# Patient Record
Sex: Female | Born: 1985
Health system: Southern US, Community
[De-identification: ages and names within clinical notes are randomized; demographics above are authoritative.]

## PROBLEM LIST (undated history)

## (undated) DIAGNOSIS — E669 Obesity, unspecified: Secondary | ICD-10-CM

## (undated) DIAGNOSIS — F99 Mental disorder, not otherwise specified: Secondary | ICD-10-CM

## (undated) DIAGNOSIS — E119 Type 2 diabetes mellitus without complications: Secondary | ICD-10-CM

## (undated) DIAGNOSIS — R161 Splenomegaly, not elsewhere classified: Secondary | ICD-10-CM

## (undated) DIAGNOSIS — I10 Essential (primary) hypertension: Secondary | ICD-10-CM

## (undated) DIAGNOSIS — G8929 Other chronic pain: Secondary | ICD-10-CM

## (undated) DIAGNOSIS — B009 Herpesviral infection, unspecified: Secondary | ICD-10-CM

## (undated) DIAGNOSIS — K2970 Gastritis, unspecified, without bleeding: Secondary | ICD-10-CM

## (undated) DIAGNOSIS — N83209 Unspecified ovarian cyst, unspecified side: Secondary | ICD-10-CM

## (undated) DIAGNOSIS — N83201 Unspecified ovarian cyst, right side: Secondary | ICD-10-CM

## (undated) DIAGNOSIS — F410 Panic disorder [episodic paroxysmal anxiety] without agoraphobia: Secondary | ICD-10-CM

## (undated) DIAGNOSIS — R748 Abnormal levels of other serum enzymes: Secondary | ICD-10-CM

## (undated) DIAGNOSIS — F32A Depression, unspecified: Secondary | ICD-10-CM

## (undated) DIAGNOSIS — R1031 Right lower quadrant pain: Secondary | ICD-10-CM

## (undated) DIAGNOSIS — R1013 Epigastric pain: Secondary | ICD-10-CM

## (undated) DIAGNOSIS — Z309 Encounter for contraceptive management, unspecified: Secondary | ICD-10-CM

## (undated) DIAGNOSIS — IMO0002 Reserved for concepts with insufficient information to code with codable children: Secondary | ICD-10-CM

## (undated) DIAGNOSIS — K760 Fatty (change of) liver, not elsewhere classified: Secondary | ICD-10-CM

## (undated) DIAGNOSIS — R7303 Prediabetes: Secondary | ICD-10-CM

## (undated) DIAGNOSIS — K219 Gastro-esophageal reflux disease without esophagitis: Secondary | ICD-10-CM

## (undated) DIAGNOSIS — F419 Anxiety disorder, unspecified: Secondary | ICD-10-CM

## (undated) DIAGNOSIS — K5900 Constipation, unspecified: Secondary | ICD-10-CM

## (undated) DIAGNOSIS — R519 Headache, unspecified: Secondary | ICD-10-CM

## (undated) DIAGNOSIS — F329 Major depressive disorder, single episode, unspecified: Secondary | ICD-10-CM

## (undated) DIAGNOSIS — R51 Headache: Secondary | ICD-10-CM

## (undated) HISTORY — DX: Fatty (change of) liver, not elsewhere classified: K76.0

## (undated) HISTORY — DX: Splenomegaly, not elsewhere classified: R16.1

## (undated) HISTORY — DX: Mental disorder, not otherwise specified: F99

## (undated) HISTORY — DX: Depression, unspecified: F32.A

## (undated) HISTORY — PX: WISDOM TOOTH EXTRACTION: SHX21

## (undated) HISTORY — DX: Unspecified ovarian cyst, right side: N83.201

## (undated) HISTORY — DX: Obesity, unspecified: E66.9

## (undated) HISTORY — DX: Abnormal levels of other serum enzymes: R74.8

## (undated) HISTORY — DX: Encounter for contraceptive management, unspecified: Z30.9

## (undated) HISTORY — DX: Major depressive disorder, single episode, unspecified: F32.9

## (undated) HISTORY — DX: Anxiety disorder, unspecified: F41.9

## (undated) HISTORY — DX: Unspecified ovarian cyst, unspecified side: N83.209

---
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
## 2007-09-16 ENCOUNTER — Emergency Department (HOSPITAL_COMMUNITY): Admission: EM | Admit: 2007-09-16 | Discharge: 2007-09-16 | Payer: Self-pay | Admitting: Emergency Medicine

## 2007-11-21 ENCOUNTER — Other Ambulatory Visit: Admission: RE | Admit: 2007-11-21 | Discharge: 2007-11-21 | Payer: Self-pay | Admitting: Obstetrics & Gynecology

## 2008-09-06 ENCOUNTER — Inpatient Hospital Stay (HOSPITAL_COMMUNITY): Admission: AD | Admit: 2008-09-06 | Discharge: 2008-09-06 | Payer: Self-pay | Admitting: Obstetrics & Gynecology

## 2008-09-06 ENCOUNTER — Ambulatory Visit: Payer: Self-pay | Admitting: Family Medicine

## 2008-09-14 ENCOUNTER — Ambulatory Visit: Payer: Self-pay | Admitting: Physician Assistant

## 2008-09-14 ENCOUNTER — Inpatient Hospital Stay (HOSPITAL_COMMUNITY): Admission: AD | Admit: 2008-09-14 | Discharge: 2008-09-17 | Payer: Self-pay | Admitting: Obstetrics & Gynecology

## 2008-09-15 ENCOUNTER — Encounter: Payer: Self-pay | Admitting: Obstetrics & Gynecology

## 2009-03-11 ENCOUNTER — Other Ambulatory Visit: Admission: RE | Admit: 2009-03-11 | Discharge: 2009-03-11 | Payer: Self-pay | Admitting: Obstetrics and Gynecology

## 2009-06-11 ENCOUNTER — Emergency Department (HOSPITAL_COMMUNITY): Admission: EM | Admit: 2009-06-11 | Discharge: 2009-06-11 | Payer: Self-pay | Admitting: Emergency Medicine

## 2009-06-11 ENCOUNTER — Encounter: Payer: Self-pay | Admitting: Orthopedic Surgery

## 2009-06-13 ENCOUNTER — Ambulatory Visit: Payer: Self-pay | Admitting: Orthopedic Surgery

## 2009-06-13 DIAGNOSIS — S8000XA Contusion of unspecified knee, initial encounter: Secondary | ICD-10-CM | POA: Insufficient documentation

## 2009-06-13 DIAGNOSIS — M25569 Pain in unspecified knee: Secondary | ICD-10-CM

## 2009-07-01 ENCOUNTER — Ambulatory Visit: Payer: Self-pay | Admitting: Orthopedic Surgery

## 2009-07-24 ENCOUNTER — Ambulatory Visit: Payer: Self-pay | Admitting: Orthopedic Surgery

## 2010-01-06 ENCOUNTER — Emergency Department (HOSPITAL_COMMUNITY): Admission: EM | Admit: 2010-01-06 | Discharge: 2010-01-06 | Payer: Self-pay | Admitting: Emergency Medicine

## 2010-03-10 ENCOUNTER — Emergency Department (HOSPITAL_COMMUNITY): Admission: EM | Admit: 2010-03-10 | Discharge: 2010-03-10 | Payer: Self-pay | Admitting: Emergency Medicine

## 2010-04-11 ENCOUNTER — Emergency Department (HOSPITAL_COMMUNITY): Admission: EM | Admit: 2010-04-11 | Discharge: 2010-04-12 | Payer: Self-pay | Admitting: Emergency Medicine

## 2010-05-11 ENCOUNTER — Emergency Department (HOSPITAL_COMMUNITY): Admission: EM | Admit: 2010-05-11 | Discharge: 2010-05-11 | Payer: Self-pay | Admitting: Emergency Medicine

## 2010-05-12 ENCOUNTER — Ambulatory Visit (HOSPITAL_COMMUNITY): Admission: RE | Admit: 2010-05-12 | Discharge: 2010-05-12 | Payer: Self-pay | Admitting: Emergency Medicine

## 2011-02-01 LAB — CBC
Hemoglobin: 14.1 g/dL (ref 12.0–15.0)
MCH: 29.6 pg (ref 26.0–34.0)
MCHC: 34.1 g/dL (ref 30.0–36.0)
MCV: 86.7 fL (ref 78.0–100.0)
Platelets: 231 10*3/uL (ref 150–400)
RDW: 13.5 % (ref 11.5–15.5)

## 2011-02-01 LAB — DIFFERENTIAL
Basophils Absolute: 0 10*3/uL (ref 0.0–0.1)
Eosinophils Relative: 2 % (ref 0–5)
Lymphocytes Relative: 21 % (ref 12–46)
Monocytes Relative: 7 % (ref 3–12)

## 2011-02-01 LAB — WET PREP, GENITAL
Trich, Wet Prep: NONE SEEN
Yeast Wet Prep HPF POC: NONE SEEN

## 2011-02-01 LAB — URINALYSIS, ROUTINE W REFLEX MICROSCOPIC
Glucose, UA: NEGATIVE mg/dL
Nitrite: NEGATIVE
pH: 6 (ref 5.0–8.0)

## 2011-02-01 LAB — URINE MICROSCOPIC-ADD ON

## 2011-02-01 LAB — POCT PREGNANCY, URINE: Preg Test, Ur: NEGATIVE

## 2011-02-02 LAB — COMPREHENSIVE METABOLIC PANEL
ALT: 30 U/L (ref 0–35)
Albumin: 3.9 g/dL (ref 3.5–5.2)
Alkaline Phosphatase: 108 U/L (ref 39–117)
CO2: 26 mEq/L (ref 19–32)
Calcium: 9.4 mg/dL (ref 8.4–10.5)
Chloride: 109 mEq/L (ref 96–112)
GFR calc non Af Amer: >60 mL/min (ref >60)
Glucose, Bld: 76 mg/dL (ref 70–99)
Potassium: 3.7 mEq/L (ref 3.5–5.1)
Total Bilirubin: 0.7 mg/dL (ref 0.3–1.2)
Total Protein: 7.3 g/dL (ref 6.0–8.3)

## 2011-02-02 LAB — URINALYSIS, ROUTINE W REFLEX MICROSCOPIC
Bilirubin Urine: NEGATIVE
Protein, ur: NEGATIVE mg/dL
Specific Gravity, Urine: >1.03 — ABNORMAL HIGH (ref 1.005–1.030)
Urobilinogen, UA: 0.2 mg/dL (ref 0.0–1.0)
pH: 5.5 (ref 5.0–8.0)

## 2011-02-02 LAB — URINE MICROSCOPIC-ADD ON

## 2011-02-02 LAB — DIFFERENTIAL
Lymphocytes Relative: 31 % (ref 12–46)
Monocytes Relative: 8 % (ref 3–12)
Neutro Abs: 5.6 10*3/uL (ref 1.7–7.7)

## 2011-02-02 LAB — URINE CULTURE

## 2011-02-02 LAB — CBC: WBC: 9.4 10*3/uL (ref 4.0–10.5)

## 2011-02-03 LAB — CBC
HCT: 39.5 % (ref 36.0–46.0)
Hemoglobin: 14 g/dL (ref 12.0–15.0)
Platelets: 243 10*3/uL (ref 150–400)
RBC: 4.71 MIL/uL (ref 3.87–5.11)
WBC: 9.6 10*3/uL (ref 4.0–10.5)

## 2011-02-03 LAB — DIFFERENTIAL
Basophils Absolute: 0.1 10*3/uL (ref 0.0–0.1)
Eosinophils Absolute: 0.1 10*3/uL (ref 0.0–0.7)
Eosinophils Relative: 1 % (ref 0–5)
Lymphocytes Relative: 22 % (ref 12–46)
Lymphs Abs: 2.1 10*3/uL (ref 0.7–4.0)
Monocytes Absolute: 0.8 10*3/uL (ref 0.1–1.0)

## 2011-02-03 LAB — BASIC METABOLIC PANEL
CO2: 24 mEq/L (ref 19–32)
Chloride: 109 mEq/L (ref 96–112)
Creatinine, Ser: 0.82 mg/dL (ref 0.4–1.2)
GFR calc Af Amer: >60 mL/min (ref >60)
GFR calc non Af Amer: >60 mL/min (ref >60)
Glucose, Bld: 97 mg/dL (ref 70–99)
Potassium: 3.8 mEq/L (ref 3.5–5.1)
Sodium: 139 mEq/L (ref 135–145)

## 2011-02-03 LAB — POCT CARDIAC MARKERS
CKMB, poc: <1 ng/mL — ABNORMAL LOW (ref 1.0–8.0)
Troponin i, poc: <0.05 ng/mL (ref 0.00–0.09)

## 2011-02-03 LAB — PREGNANCY, URINE: Preg Test, Ur: NEGATIVE

## 2011-02-04 LAB — DIFFERENTIAL
Basophils Absolute: 0 10*3/uL (ref 0.0–0.1)
Basophils Relative: 0 % (ref 0–1)
Eosinophils Absolute: 0 10*3/uL (ref 0.0–0.7)
Eosinophils Relative: 0 % (ref 0–5)

## 2011-02-04 LAB — COMPREHENSIVE METABOLIC PANEL
ALT: 60 U/L — ABNORMAL HIGH (ref 0–35)
AST: 88 U/L — ABNORMAL HIGH (ref 0–37)
Alkaline Phosphatase: 122 U/L — ABNORMAL HIGH (ref 39–117)
CO2: 21 mEq/L (ref 19–32)
Chloride: 110 mEq/L (ref 96–112)
GFR calc Af Amer: >60 mL/min (ref >60)
GFR calc non Af Amer: >60 mL/min (ref >60)
Glucose, Bld: 104 mg/dL — ABNORMAL HIGH (ref 70–99)
Sodium: 140 mEq/L (ref 135–145)
Total Bilirubin: 0.5 mg/dL (ref 0.3–1.2)

## 2011-02-04 LAB — CBC
Hemoglobin: 15.5 g/dL — ABNORMAL HIGH (ref 12.0–15.0)
RBC: 5.53 MIL/uL — ABNORMAL HIGH (ref 3.87–5.11)
WBC: 5.7 10*3/uL (ref 4.0–10.5)

## 2011-05-12 ENCOUNTER — Other Ambulatory Visit (HOSPITAL_COMMUNITY)
Admission: RE | Admit: 2011-05-12 | Discharge: 2011-05-12 | Disposition: A | Discharge status: Home / Self Care | Payer: Medicaid Other | Source: Ambulatory Visit | Attending: Obstetrics and Gynecology | Admitting: Obstetrics and Gynecology

## 2011-05-12 ENCOUNTER — Other Ambulatory Visit: Payer: Self-pay | Admitting: Adult Health

## 2011-05-12 DIAGNOSIS — Z113 Encounter for screening for infections with a predominantly sexual mode of transmission: Secondary | ICD-10-CM | POA: Insufficient documentation

## 2011-05-12 DIAGNOSIS — Z01419 Encounter for gynecological examination (general) (routine) without abnormal findings: Secondary | ICD-10-CM | POA: Insufficient documentation

## 2011-05-26 ENCOUNTER — Other Ambulatory Visit: Payer: Self-pay

## 2011-05-26 ENCOUNTER — Emergency Department (HOSPITAL_COMMUNITY)
Admission: EM | Admit: 2011-05-26 | Discharge: 2011-05-27 | Disposition: A | Discharge status: Home / Self Care | Payer: Medicaid Other | Attending: Emergency Medicine | Admitting: Emergency Medicine

## 2011-05-26 ENCOUNTER — Emergency Department (HOSPITAL_COMMUNITY): Payer: Medicaid Other

## 2011-05-26 ENCOUNTER — Encounter: Payer: Self-pay | Admitting: *Deleted

## 2011-05-26 DIAGNOSIS — R071 Chest pain on breathing: Secondary | ICD-10-CM | POA: Insufficient documentation

## 2011-05-26 DIAGNOSIS — R0789 Other chest pain: Secondary | ICD-10-CM

## 2011-05-26 MED ORDER — IBUPROFEN 800 MG PO TABS
800.0000 mg | ORAL_TABLET | Freq: Once | ORAL | Status: AC
  Administered 2011-05-27: 800 mg via ORAL
  Filled 2011-05-26: qty 1

## 2011-05-26 MED ORDER — ONDANSETRON HCL 4 MG/2ML IJ SOLN
4.0000 mg | Freq: Once | INTRAMUSCULAR | Status: AC
  Administered 2011-05-27: 4 mg via INTRAMUSCULAR
  Filled 2011-05-26: qty 2

## 2011-05-26 MED ORDER — HYDROCODONE-ACETAMINOPHEN 5-325 MG PO TABS
1.0000 | ORAL_TABLET | Freq: Once | ORAL | Status: AC
  Administered 2011-05-27: 1 via ORAL
  Filled 2011-05-26: qty 1

## 2011-05-26 NOTE — ED Notes (Signed)
C/o mid to left sided chest pain onset while playing with her son approx 3 hours ago; c/o nausea with pain; describes as sharp and tight, non-radiating; states pain is made worse with deep breathing

## 2011-05-26 NOTE — ED Notes (Signed)
EDP at bedside  

## 2011-05-26 NOTE — ED Provider Notes (Signed)
History     Chief Complaint  Patient presents with  . Chest Pain    mid to left-sided chest pain x 3 hours   Patient is a 25 y.o. female presenting with chest pain. The history is provided by the patient.  Chest Pain The chest pain began 3 - 5 hours ago. Chest pain occurs intermittently. The chest pain is unchanged. The pain is associated with breathing, coughing and lifting. The severity of the pain is moderate. The quality of the pain is described as aching and sharp. The pain does not radiate. Chest pain is worsened by deep breathing and certain positions. Pertinent negatives for primary symptoms include no fever, no syncope, no shortness of breath, no cough, no wheezing, no palpitations, no abdominal pain, no nausea, no vomiting and no dizziness. She tried nothing for the symptoms. Risk factors include no known risk factors.     History reviewed. No pertinent past medical history.  History reviewed. No pertinent past surgical history.  No family history on file.  History  Substance Use Topics  . Smoking status: Never Smoker   . Smokeless tobacco: Not on file  . Alcohol Use: No    OB History    Grav Para Term Preterm Abortions TAB SAB Ect Mult Living                  Review of Systems  Constitutional: Negative for fever.  Respiratory: Negative for cough, shortness of breath and wheezing.   Cardiovascular: Positive for chest pain. Negative for palpitations and syncope.  Gastrointestinal: Negative for nausea, vomiting and abdominal pain.  Neurological: Negative for dizziness.  All other systems reviewed and are negative.    Physical Exam  BP 115/55  Pulse 101  Temp(Src) 98.4 F (36.9 C) (Oral)  Resp 20  Ht 5\' 7"  (1.702 m)  Wt 268 lb (121.564 kg)  BMI 41.97 kg/m2  SpO2 100%  LMP 05/12/2011  Physical Exam  Nursing note and vitals reviewed. Constitutional: She is oriented to person, place, and time. She appears well-developed and well-nourished.  HENT:  Head:  Normocephalic and atraumatic.  Eyes: EOM are normal. Pupils are equal, round, and reactive to light.  Neck: Normal range of motion. Neck supple.  Cardiovascular: Normal rate, regular rhythm, normal heart sounds and intact distal pulses.   Pulmonary/Chest: Effort normal and breath sounds normal. She exhibits tenderness.       Mild chest pain with palpation over anterior chest  Abdominal: Soft. Bowel sounds are normal.  Musculoskeletal: Normal range of motion.  Neurological: She is alert and oriented to person, place, and time.  Skin: Skin is warm and dry.  Psychiatric: She has a normal mood and affect.    ED Course  Procedures  MDM  Date: 05/26/2011  Rate: 100  Rhythm: normal sinus rhythm  QRS Axis: normal  Intervals: normal  ST/T Wave abnormalities: normal  Conduction Disutrbances:none  Narrative Interpretation:   Old EKG Reviewed: none available        Pairlee Sawtell S. Colon Branch, MD 05/27/11 [phone]

## 2011-05-27 ENCOUNTER — Other Ambulatory Visit (HOSPITAL_COMMUNITY): Payer: Self-pay | Admitting: Emergency Medicine

## 2011-05-27 LAB — BASIC METABOLIC PANEL
CO2: 23 mEq/L (ref 19–32)
Calcium: 9.6 mg/dL (ref 8.4–10.5)
Creatinine, Ser: 0.93 mg/dL (ref 0.50–1.10)
GFR calc Af Amer: >60 mL/min (ref >60)
GFR calc non Af Amer: >60 mL/min (ref >60)

## 2011-05-27 LAB — D-DIMER, QUANTITATIVE: D-Dimer, Quant: 0.6 ug/mL-FEU — ABNORMAL HIGH (ref 0.00–0.48)

## 2011-05-27 NOTE — ED Notes (Signed)
Pt reports pain in chest.  Reports pain gets worse when she breaths deeply.  Reports that pain started yesterday.  Denies any radiation of pain.  Reports some nausea as well.  Denies vomiting or fever.

## 2011-05-27 NOTE — ED Notes (Signed)
Pt reports abdominal pain has improved.  Nausea subsided.  Pt tolerating PO fluids with no difficulty at this time.

## 2011-05-27 NOTE — Progress Notes (Signed)
0454 Reviewed results with patient and her mother. Patient has been painfree after being given anagesic. She has taken PO meds and PO fluids

## 2011-08-06 ENCOUNTER — Encounter (HOSPITAL_COMMUNITY): Payer: Self-pay | Admitting: *Deleted

## 2011-08-06 ENCOUNTER — Emergency Department (HOSPITAL_COMMUNITY)
Admission: EM | Admit: 2011-08-06 | Discharge: 2011-08-06 | Disposition: A | Discharge status: Home / Self Care | Payer: Medicaid Other | Attending: Emergency Medicine | Admitting: Emergency Medicine

## 2011-08-06 DIAGNOSIS — L089 Local infection of the skin and subcutaneous tissue, unspecified: Secondary | ICD-10-CM | POA: Insufficient documentation

## 2011-08-06 DIAGNOSIS — M79609 Pain in unspecified limb: Secondary | ICD-10-CM | POA: Insufficient documentation

## 2011-08-06 MED ORDER — SULFAMETHOXAZOLE-TRIMETHOPRIM 800-160 MG PO TABS: 1.0000 | ORAL_TABLET | Freq: Two times a day (BID) | ORAL | Status: AC

## 2011-08-06 MED ORDER — IBUPROFEN 600 MG PO TABS: 600.0000 mg | ORAL_TABLET | Freq: Four times a day (QID) | ORAL | Status: AC | PRN

## 2011-08-06 NOTE — ED Notes (Signed)
Pt c/o pain to right great toe. Pt states toe has been hurting for 3 days. Pt states she has an ingrown toenail.

## 2011-08-06 NOTE — ED Provider Notes (Signed)
History     CSN: [id number] Arrival date & time: 08/06/2011 12:25 PM   Chief Complaint  Patient presents with  . Toe Pain     (Include location/radiation/quality/duration/timing/severity/associated sxs/prior treatment) Patient is a 25 y.o. female presenting with toe pain. The history is provided by the patient.  Toe Pain This is a new problem. The current episode started in the past 7 days. The problem occurs constantly. The problem has been unchanged. Pertinent negatives include no fever. The symptoms are aggravated by walking and standing (Palpation). Treatments tried: A pin was poked in the wound yesterday with no drainage obtained. The treatment provided no relief.     History reviewed. No pertinent past medical history.   History reviewed. No pertinent past surgical history.  History reviewed. No pertinent family history.  History  Substance Use Topics  . Smoking status: Never Smoker   . Smokeless tobacco: Not on file  . Alcohol Use: No    OB History    Grav Para Term Preterm Abortions TAB SAB Ect Mult Living                  Review of Systems  Constitutional: Negative for fever.  All other systems reviewed and are negative.    Allergies  Review of patient's allergies indicates no known allergies.  Home Medications   Current Outpatient Rx  Name Route Sig Dispense Refill  . FLUCONAZOLE 100 MG PO TABS Oral Take 100 mg by mouth 2 (two) times daily.      . IBUPROFEN 200 MG PO TABS Oral Take 200 mg by mouth every 6 (six) hours as needed. For pain     . MEDROXYPROGESTERONE ACETATE 150 MG/ML IM SUSP Intramuscular Inject 150 mg into the muscle every 3 (three) months. Last shot in september       Physical Exam    BP 134/77  Pulse 96  Temp(Src) 97.9 F (36.6 C) (Oral)  Resp 18  Ht 5\' 7"  (1.702 m)  Wt 263 lb (119.296 kg)  BMI 41.19 kg/m2  SpO2 100%  LMP 07/11/2011  Physical Exam  Nursing note and vitals reviewed. Constitutional: She is oriented to  person, place, and time. She appears well-developed and well-nourished.  HENT:  Head: Normocephalic.  Eyes: Conjunctivae are normal.  Neck: Normal range of motion.  Cardiovascular: Normal rate and intact distal pulses.  Exam reveals no decreased pulses.   Pulses:      Dorsalis pedis pulses are 2+ on the right side, and 2+ on the left side.       Posterior tibial pulses are 2+ on the right side, and 2+ on the left side.  Pulmonary/Chest: Effort normal.  Musculoskeletal: She exhibits tenderness. She exhibits no edema.       Right ankle: Achilles tendon normal.       Feet:       Toe nail cuticle with erythema,  ttp with scant purulent drainage from nail fold,  No induration or fluctuance, no identifiable drainable pus pocket.     Neurological: She is alert and oriented to person, place, and time. No sensory deficit.  Skin: Skin is warm, dry and intact.    ED Course  Procedures  Results for orders placed during the hospital encounter of 05/26/11  D-DIMER, QUANTITATIVE      Component Value Range   D-Dimer, Quant 0.60 (*) 0.00 - 0.48 (ug/mL-FEU)  BASIC METABOLIC PANEL      Component Value Range   Sodium 138  135 - 145 (  mEq/L)   Potassium 3.5  3.5 - 5.1 (mEq/L)   Chloride 105  96 - 112 (mEq/L)   CO2 23  19 - 32 (mEq/L)   Glucose, Bld 86  70 - 99 (mg/dL)   BUN 16  6 - 23 (mg/dL)   Creatinine, Ser 8.11  0.50 - 1.10 (mg/dL)   Calcium 9.6  8.4 - 91.4 (mg/dL)   GFR calc non Af Amer >60  >60 (mL/min)   GFR calc Af Amer >60  >60 (mL/min)   No results found.   No diagnosis found.   MDM  Skin infection,  Possible ingrown toenail.  Abx,  Warm epsom soaks,  F/u pcp for  Recheck next week.      Candis Musa, PA 08/06/11 1439

## 2011-08-11 ENCOUNTER — Emergency Department (HOSPITAL_COMMUNITY)
Admission: EM | Admit: 2011-08-11 | Discharge: 2011-08-11 | Disposition: A | Discharge status: Home / Self Care | Payer: Medicaid Other | Attending: Emergency Medicine | Admitting: Emergency Medicine

## 2011-08-11 ENCOUNTER — Encounter (HOSPITAL_COMMUNITY): Payer: Self-pay | Admitting: Emergency Medicine

## 2011-08-11 DIAGNOSIS — L03039 Cellulitis of unspecified toe: Secondary | ICD-10-CM | POA: Insufficient documentation

## 2011-08-11 DIAGNOSIS — L03031 Cellulitis of right toe: Secondary | ICD-10-CM

## 2011-08-11 MED ORDER — LIDOCAINE HCL (PF) 2 % IJ SOLN
INTRAMUSCULAR | Status: AC
  Administered 2011-08-11: 17:00:00
  Filled 2011-08-11: qty 10

## 2011-08-11 MED ORDER — CEPHALEXIN 500 MG PO CAPS: 500.0000 mg | ORAL_CAPSULE | Freq: Four times a day (QID) | ORAL | Status: AC

## 2011-08-11 MED ORDER — HYDROCODONE-ACETAMINOPHEN 5-325 MG PO TABS: 1.0000 | ORAL_TABLET | ORAL | Status: AC | PRN

## 2011-08-11 NOTE — ED Provider Notes (Signed)
History     CSN: [id number] Arrival date & time: 08/11/2011  3:39 PM  Chief Complaint  Patient presents with  . Toe Pain    HPI  (Consider location/radiation/quality/duration/timing/severity/associated sxs/prior treatment)  Patient is a 25 y.o. female presenting with toe pain. The history is provided by the patient.  Toe Pain This is a recurrent problem. The current episode started in the past 7 days. The problem occurs constantly. The problem has been gradually worsening. Pertinent negatives include no chills or fever. Exacerbated by: palpation. She has tried NSAIDs (antibiotics and warm soaks.) for the symptoms. The treatment provided no relief.    History reviewed. No pertinent past medical history.  History reviewed. No pertinent past surgical history.  Family History  Problem Relation Age of Onset  . Diabetes Mother   . Diabetes Other   . Hypertension Other     History  Substance Use Topics  . Smoking status: Former Smoker -- 3 years    Types: Cigarettes    Quit date: 07/28/2011  . Smokeless tobacco: Never Used  . Alcohol Use: No    OB History    Grav Para Term Preterm Abortions TAB SAB Ect Mult Living   1 1 1              Review of Systems  Review of Systems  Constitutional: Negative for fever and chills.  All other systems reviewed and are negative.    Allergies  Review of patient's allergies indicates no known allergies.  Home Medications   Current Outpatient Rx  Name Route Sig Dispense Refill  . IBUPROFEN 600 MG PO TABS Oral Take 1 tablet (600 mg total) by mouth every 6 (six) hours as needed for pain. 30 tablet 0  . MEDROXYPROGESTERONE ACETATE 150 MG/ML IM SUSP Intramuscular Inject 150 mg into the muscle every 3 (three) months. Last shot in september     . SULFAMETHOXAZOLE-TRIMETHOPRIM 800-160 MG PO TABS Oral Take 1 tablet by mouth 2 (two) times daily. 14 tablet 0  . FLUCONAZOLE 100 MG PO TABS Oral Take 100 mg by mouth 2 (two) times daily.      Marland Kitchen  HYDROCODONE-ACETAMINOPHEN 5-325 MG PO TABS Oral Take 1 tablet by mouth every 4 (four) hours as needed for pain. 15 tablet 0  . IBUPROFEN 200 MG PO TABS Oral Take 200 mg by mouth every 6 (six) hours as needed. For pain       Physical Exam    BP 126/65  Pulse 97  Temp(Src) 98.1 F (36.7 C) (Oral)  Resp 20  Ht 5\' 6"  (1.676 m)  Wt 236 lb (107.049 kg)  BMI 38.09 kg/m2  SpO2 99%  LMP 07/11/2011  Physical Exam  Nursing note and vitals reviewed. Constitutional: She is oriented to person, place, and time. She appears well-developed and well-nourished.  HENT:  Head: Normocephalic and atraumatic.  Eyes: Conjunctivae are normal.  Neck: Normal range of motion.  Cardiovascular: Normal rate, regular rhythm, normal heart sounds and intact distal pulses.   Pulmonary/Chest: Effort normal and breath sounds normal. She has no wheezes.  Abdominal: Soft. Bowel sounds are normal. There is no tenderness.  Musculoskeletal: Normal range of motion.  Neurological: She is alert and oriented to person, place, and time.  Skin: Skin is warm and dry. There is erythema.       Erythema along lateral nail cuticle of right great toe,  Less swelling than appreciated on last weeks exam.  Scant dried yellow dc,  No induration or fluctuance.  Nail edge below distal nail cuticle consistent with ingrown nail.  Pedal pulses intact.    Psychiatric: She has a normal mood and affect.    ED Course  Excise ingrown toenail Date/Time: 08/11/2011 5:24 PM Performed by: Reese Stockman L Authorized by: [name] Consent: Verbal consent obtained. Risks and benefits: risks, benefits and alternatives were discussed Consent given by: patient Patient understanding: patient states understanding of the procedure being performed Preparation: Patient was prepped and draped in the usual sterile fashion. Local anesthesia used: yes Anesthesia: digital block Local anesthetic: lidocaine 2% without epinephrine Anesthetic total: 3  ml Patient tolerance: Patient tolerated the procedure well with no immediate complications. Comments: Nail edge trimmed after loosening from under lateral nail fold,  No hook or significant ingrown nail found.   (including critical care time)  Family quite upset over this being second visit and "nothing was done for her".  Explained that she was treated with abx and anti - inflammatory which is the standard emergency care treatment this type complaint and definitive care should be by pcp or podiatry specialist.  Father still very angry as pt was unable to get in with pcp for  Recheck,  And apparently felt we are somehow responsible for this.   Offered to numb the toe,  And trim the nail back and pull any ingrown nail out,  But reiterated more than once that this was at best a temporary solution,  And she will still probably need definitive tx for this.  Deferred at first,  Then agreed to procedure.  See procedure note.  No ingrown nail appreciated with exploration .    MDM Hydrocodone for pain,  Patient to complete abx,  Also added keflex,  Continued warm soaks.  Referral to Dr Pricilla Holm, podiatry.   Spoke with Dr Renard Matter who also  recommends to podiatry for definitive f/u care.        Candis Musa, PA 08/11/11 1729

## 2011-08-11 NOTE — ED Notes (Signed)
Pt states she has had R great toe infection and pain for over a week. Was seen her last week and placed on abx. States pain and redness is worse.

## 2011-08-12 NOTE — ED Provider Notes (Signed)
Medical screening examination/treatment/procedure(s) were performed by non-physician practitioner and as supervising physician I was immediately available for consultation/collaboration.   [name], MD 08/12/11 1538

## 2011-08-13 NOTE — ED Provider Notes (Signed)
Medical screening examination/treatment/procedure(s) were performed by non-physician practitioner and as supervising physician I was immediately available for consultation/collaboration.  Raeford Razor, MD 08/13/11 [phone]

## 2011-08-18 LAB — CBC
MCV: 94.2
Platelets: 233
WBC: 12.3 — ABNORMAL HIGH

## 2011-08-18 LAB — RPR: RPR Ser Ql: NONREACTIVE

## 2011-08-26 ENCOUNTER — Emergency Department (HOSPITAL_COMMUNITY)
Admission: EM | Admit: 2011-08-26 | Discharge: 2011-08-27 | Disposition: A | Discharge status: Home / Self Care | Payer: Medicaid Other | Attending: Emergency Medicine | Admitting: Emergency Medicine

## 2011-08-26 ENCOUNTER — Encounter (HOSPITAL_COMMUNITY): Payer: Self-pay

## 2011-08-26 DIAGNOSIS — R748 Abnormal levels of other serum enzymes: Secondary | ICD-10-CM | POA: Insufficient documentation

## 2011-08-26 DIAGNOSIS — R1031 Right lower quadrant pain: Secondary | ICD-10-CM | POA: Insufficient documentation

## 2011-08-26 DIAGNOSIS — N83209 Unspecified ovarian cyst, unspecified side: Secondary | ICD-10-CM | POA: Insufficient documentation

## 2011-08-26 LAB — BASIC METABOLIC PANEL
BUN: 11 mg/dL (ref 6–23)
Chloride: 104 mEq/L (ref 96–112)
Creatinine, Ser: 0.69 mg/dL (ref 0.50–1.10)
GFR calc Af Amer: >90 mL/min (ref >90)

## 2011-08-26 LAB — CBC
HCT: 41.2 % (ref 36.0–46.0)
Hemoglobin: 14.3 g/dL (ref 12.0–15.0)
MCH: 29.9 pg (ref 26.0–34.0)
MCHC: 34.7 g/dL (ref 30.0–36.0)
MCV: 86 fL (ref 78.0–100.0)

## 2011-08-26 LAB — URINALYSIS, ROUTINE W REFLEX MICROSCOPIC
Bilirubin Urine: NEGATIVE
Bilirubin Urine: NEGATIVE
Hgb urine dipstick: NEGATIVE
Ketones, ur: NEGATIVE
Nitrite: POSITIVE — AB
Specific Gravity, Urine: 1.015
Specific Gravity, Urine: 1.015 (ref 1.005–1.030)
Urobilinogen, UA: 0.2
Urobilinogen, UA: 0.2 mg/dL (ref 0.0–1.0)
pH: 5.5 (ref 5.0–8.0)

## 2011-08-26 LAB — DIFFERENTIAL
Basophils Relative: 0 % (ref 0–1)
Eosinophils Relative: 1 % (ref 0–5)
Monocytes Absolute: 1.1 10*3/uL — ABNORMAL HIGH (ref 0.1–1.0)
Monocytes Relative: 9 % (ref 3–12)
Neutro Abs: 6.8 10*3/uL (ref 1.7–7.7)

## 2011-08-26 LAB — URINE MICROSCOPIC-ADD ON

## 2011-08-26 MED ORDER — SODIUM CHLORIDE 0.9 % IV SOLN
Freq: Once | INTRAVENOUS | Status: AC
  Administered 2011-08-27: 1000 mL via INTRAVENOUS

## 2011-08-26 MED ORDER — MORPHINE SULFATE 2 MG/ML IJ SOLN
2.0000 mg | Freq: Once | INTRAMUSCULAR | Status: AC
  Administered 2011-08-27: 2 mg via INTRAVENOUS
  Filled 2011-08-26: qty 1

## 2011-08-26 MED ORDER — ONDANSETRON HCL 4 MG/2ML IJ SOLN
4.0000 mg | Freq: Once | INTRAMUSCULAR | Status: AC
  Administered 2011-08-27: 4 mg via INTRAVENOUS
  Filled 2011-08-26: qty 2

## 2011-08-26 NOTE — ED Notes (Signed)
rlq abd pain x 2 days

## 2011-08-27 LAB — HEPATIC FUNCTION PANEL
ALT: 21 U/L (ref 0–35)
Albumin: 3.8 g/dL (ref 3.5–5.2)
Alkaline Phosphatase: 101 U/L (ref 39–117)
Total Protein: 7.2 g/dL (ref 6.0–8.3)

## 2011-08-27 MED ORDER — IBUPROFEN 600 MG PO TABS: 600.0000 mg | ORAL_TABLET | Freq: Four times a day (QID) | ORAL | Status: AC | PRN

## 2011-08-27 NOTE — ED Provider Notes (Addendum)
History     CSN: [id number] Arrival date & time: 08/26/2011 10:20 PM  Chief Complaint  Patient presents with  . Abdominal Pain    (Consider location/radiation/quality/duration/timing/severity/associated sxs/prior treatment) HPI Comments: Seen 2314.  Patient is a 25 y.o. female presenting with abdominal pain. The history is provided by the patient.  Abdominal Pain The primary symptoms of the illness include abdominal pain. The current episode started yesterday. The onset of the illness was gradual. The problem has been rapidly worsening.  The abdominal pain is located in the RLQ. The abdominal pain does not radiate. The severity of the abdominal pain is 7/10. The abdominal pain is relieved by nothing.  The patient states that she believes she is currently not pregnant (Patient is on depo). The patient has not had a change in bowel habit.    History reviewed. No pertinent past medical history.  History reviewed. No pertinent past surgical history.  Family History  Problem Relation Age of Onset  . Diabetes Mother   . Diabetes Other   . Hypertension Other     History  Substance Use Topics  . Smoking status: Former Smoker -- 3 years    Types: Cigarettes    Quit date: 07/28/2011  . Smokeless tobacco: Never Used  . Alcohol Use: No    OB History    Grav Para Term Preterm Abortions TAB SAB Ect Mult Living   1 1 1              Review of Systems  Gastrointestinal: Positive for abdominal pain.  All other systems reviewed and are negative.    Allergies  Review of patient's allergies indicates no known allergies.  Home Medications   Current Outpatient Rx  Name Route Sig Dispense Refill  . IBUPROFEN 200 MG PO TABS Oral Take 200 mg by mouth every 6 (six) hours as needed. For pain     . MEDROXYPROGESTERONE ACETATE 150 MG/ML IM SUSP Intramuscular Inject 150 mg into the muscle every 3 (three) months. Last shot in september    . FLUCONAZOLE 100 MG PO TABS Oral Take 100 mg by  mouth 2 (two) times daily.        BP 150/76  Pulse 102  Temp 98.2 F (36.8 C)  Resp 18  Ht 5\' 7"  (1.702 m)  Wt 236 lb (107.049 kg)  BMI 36.96 kg/m2  SpO2 99%  LMP 07/11/2011  Physical Exam  Nursing note and vitals reviewed. Constitutional: She is oriented to person, place, and time. She appears well-developed and well-nourished.  HENT:  Head: Normocephalic and atraumatic.  Mouth/Throat: Oropharynx is clear and moist.  Eyes: EOM are normal.  Neck: Normal range of motion. Neck supple.  Cardiovascular: Normal rate, normal heart sounds and intact distal pulses.   Pulmonary/Chest: Effort normal and breath sounds normal.  Abdominal: Soft. Bowel sounds are normal. She exhibits no distension. There is tenderness.       RLQ tenderness with no rebound or guarding.  Genitourinary:       No marked tenderness to right adnexa with bimanual exam  Musculoskeletal: Normal range of motion.  Neurological: She is alert and oriented to person, place, and time.  Skin: Skin is warm and dry.    ED Course  Procedures (including critical care time)  Results for orders placed during the hospital encounter of 08/26/11  CBC      Component Value Range   WBC 11.7 (*) 4.0 - 10.5 (K/uL)   RBC 4.79  3.87 - 5.11 (MIL/uL)  Hemoglobin 14.3  12.0 - 15.0 (g/dL)   HCT 16.1  09.6 - 04.5 (%)   MCV 86.0  78.0 - 100.0 (fL)   MCH 29.9  26.0 - 34.0 (pg)   MCHC 34.7  30.0 - 36.0 (g/dL)   RDW 40.9  81.1 - 91.4 (%)   Platelets 248  150 - 400 (K/uL)  DIFFERENTIAL      Component Value Range   Neutrophils Relative 58  43 - 77 (%)   Neutro Abs 6.8  1.7 - 7.7 (K/uL)   Lymphocytes Relative 31  12 - 46 (%)   Lymphs Abs 3.6  0.7 - 4.0 (K/uL)   Monocytes Relative 9  3 - 12 (%)   Monocytes Absolute 1.1 (*) 0.1 - 1.0 (K/uL)   Eosinophils Relative 1  0 - 5 (%)   Eosinophils Absolute 0.2  0.0 - 0.7 (K/uL)   Basophils Relative 0  0 - 1 (%)   Basophils Absolute 0.1  0.0 - 0.1 (K/uL)  BASIC METABOLIC PANEL       Component Value Range   Sodium 137  135 - 145 (mEq/L)   Potassium 3.7  3.5 - 5.1 (mEq/L)   Chloride 104  96 - 112 (mEq/L)   CO2 22  19 - 32 (mEq/L)   Glucose, Bld 122 (*) 70 - 99 (mg/dL)   BUN 11  6 - 23 (mg/dL)   Creatinine, Ser 7.82  0.50 - 1.10 (mg/dL)   Calcium 9.4  8.4 - 95.6 (mg/dL)   GFR calc non Af Amer >90  >90 (mL/min)   GFR calc Af Amer >90  >90 (mL/min)  LIPASE, BLOOD      Component Value Range   Lipase 66 (*) 11 - 59 (U/L)  URINALYSIS, ROUTINE W REFLEX MICROSCOPIC      Component Value Range   Color, Urine YELLOW  YELLOW    Appearance CLEAR  CLEAR    Specific Gravity, Urine 1.015  1.005 - 1.030    pH 5.5  5.0 - 8.0    Glucose, UA NEGATIVE  NEGATIVE (mg/dL)   Hgb urine dipstick NEGATIVE  NEGATIVE    Bilirubin Urine NEGATIVE  NEGATIVE    Ketones, ur NEGATIVE  NEGATIVE (mg/dL)   Protein, ur NEGATIVE  NEGATIVE (mg/dL)   Urobilinogen, UA 0.2  0.0 - 1.0 (mg/dL)   Nitrite NEGATIVE  NEGATIVE    Leukocytes, UA NEGATIVE  NEGATIVE   PREGNANCY, URINE      Component Value Range   Preg Test, Ur NEGATIVE    HEPATIC FUNCTION PANEL      Component Value Range   Total Protein 7.2  6.0 - 8.3 (g/dL)   Albumin 3.8  3.5 - 5.2 (g/dL)   AST 36  0 - 37 (U/L)   ALT 21  0 - 35 (U/L)   Alkaline Phosphatase 101  39 - 117 (U/L)   Total Bilirubin 0.3  0.3 - 1.2 (mg/dL)   Bilirubin, Direct 0.1  0.0 - 0.3 (mg/dL)   Indirect Bilirubin 0.2 (*) 0.3 - 0.9 (mg/dL)   Patient with RLQ abdominal pain. LMP in August. She is on Depo. Has a h/o ovarian cysts in the past. Pain is similar. Labs unremarkable. Elevated lipase at 66 without epigastric or RUQ pain. Can be followed by PCP. Patient responded to IVF, analgesics.Has taken PO fluids. Pt feels improved after observation and/or treatment in ED.Pt stable in ED with no significant deterioration in condition.The patient appears reasonably screened and/or stabilized for discharge and I doubt  any other medical condition or other [facility] requiring further  screening, evaluation, or treatment in the ED at this time prior to discharge. MDM Reviewed: nursing note and vitals Interpretation: labs           Nicoletta Dress. Colon Branch, MD 08/27/11 0240  Nicoletta Dress. Colon Branch, MD 09/12/11 [phone]

## 2011-08-27 NOTE — ED Notes (Signed)
Pt stable at discharge pain relieved; steady gait

## 2011-08-28 ENCOUNTER — Ambulatory Visit (HOSPITAL_COMMUNITY)
Admission: RE | Admit: 2011-08-28 | Discharge: 2011-08-28 | Disposition: A | Discharge status: Home / Self Care | Payer: Medicaid Other | Source: Ambulatory Visit | Attending: Family Medicine | Admitting: Family Medicine

## 2011-08-28 ENCOUNTER — Other Ambulatory Visit (HOSPITAL_COMMUNITY): Payer: Self-pay | Admitting: Family Medicine

## 2011-08-28 DIAGNOSIS — R1031 Right lower quadrant pain: Secondary | ICD-10-CM

## 2011-08-28 DIAGNOSIS — N915 Oligomenorrhea, unspecified: Secondary | ICD-10-CM | POA: Insufficient documentation

## 2011-08-31 ENCOUNTER — Other Ambulatory Visit (HOSPITAL_COMMUNITY): Payer: Self-pay | Admitting: General Surgery

## 2011-08-31 ENCOUNTER — Ambulatory Visit (HOSPITAL_COMMUNITY)
Admission: RE | Admit: 2011-08-31 | Discharge: 2011-08-31 | Disposition: A | Discharge status: Home / Self Care | Payer: Medicaid Other | Source: Ambulatory Visit | Attending: General Surgery | Admitting: General Surgery

## 2011-08-31 DIAGNOSIS — R109 Unspecified abdominal pain: Secondary | ICD-10-CM

## 2011-08-31 DIAGNOSIS — R1031 Right lower quadrant pain: Secondary | ICD-10-CM | POA: Insufficient documentation

## 2011-09-21 ENCOUNTER — Emergency Department (HOSPITAL_COMMUNITY)
Admission: EM | Admit: 2011-09-21 | Discharge: 2011-09-21 | Disposition: A | Discharge status: Home / Self Care | Payer: Medicaid Other | Attending: Emergency Medicine | Admitting: Emergency Medicine

## 2011-09-21 ENCOUNTER — Encounter (HOSPITAL_COMMUNITY): Payer: Self-pay | Admitting: *Deleted

## 2011-09-21 DIAGNOSIS — M549 Dorsalgia, unspecified: Secondary | ICD-10-CM

## 2011-09-21 DIAGNOSIS — R350 Frequency of micturition: Secondary | ICD-10-CM | POA: Insufficient documentation

## 2011-09-21 DIAGNOSIS — R300 Dysuria: Secondary | ICD-10-CM | POA: Insufficient documentation

## 2011-09-21 DIAGNOSIS — R109 Unspecified abdominal pain: Secondary | ICD-10-CM | POA: Insufficient documentation

## 2011-09-21 DIAGNOSIS — N390 Urinary tract infection, site not specified: Secondary | ICD-10-CM | POA: Insufficient documentation

## 2011-09-21 DIAGNOSIS — M545 Low back pain, unspecified: Secondary | ICD-10-CM | POA: Insufficient documentation

## 2011-09-21 DIAGNOSIS — Z87891 Personal history of nicotine dependence: Secondary | ICD-10-CM | POA: Insufficient documentation

## 2011-09-21 DIAGNOSIS — B009 Herpesviral infection, unspecified: Secondary | ICD-10-CM | POA: Insufficient documentation

## 2011-09-21 HISTORY — DX: Herpesviral infection, unspecified: B00.9

## 2011-09-21 LAB — URINALYSIS, ROUTINE W REFLEX MICROSCOPIC
Bilirubin Urine: NEGATIVE
Glucose, UA: NEGATIVE mg/dL
Ketones, ur: NEGATIVE mg/dL
pH: 6.5 (ref 5.0–8.0)

## 2011-09-21 LAB — URINE MICROSCOPIC-ADD ON

## 2011-09-21 MED ORDER — KETOROLAC TROMETHAMINE 60 MG/2ML IM SOLN
60.0000 mg | Freq: Once | INTRAMUSCULAR | Status: AC
  Administered 2011-09-21: 60 mg via INTRAMUSCULAR
  Filled 2011-09-21: qty 2

## 2011-09-21 MED ORDER — CYCLOBENZAPRINE HCL 10 MG PO TABS: 10.0000 mg | ORAL_TABLET | Freq: Three times a day (TID) | ORAL | Status: DC | PRN

## 2011-09-21 MED ORDER — CIPROFLOXACIN HCL 500 MG PO TABS: 500.0000 mg | ORAL_TABLET | Freq: Two times a day (BID) | ORAL | Status: AC

## 2011-09-21 MED ORDER — DIAZEPAM 5 MG PO TABS
5.0000 mg | ORAL_TABLET | Freq: Once | ORAL | Status: AC
  Administered 2011-09-21: 5 mg via ORAL
  Filled 2011-09-21: qty 1

## 2011-09-21 MED ORDER — HYDROCODONE-ACETAMINOPHEN 5-325 MG PO TABS: ORAL_TABLET | ORAL | Status: AC

## 2011-09-21 MED ORDER — CIPROFLOXACIN HCL 250 MG PO TABS
500.0000 mg | ORAL_TABLET | Freq: Once | ORAL | Status: AC
  Administered 2011-09-21: 500 mg via ORAL
  Filled 2011-09-21: qty 2

## 2011-09-21 NOTE — ED Provider Notes (Signed)
History     CSN: [id number] Arrival date & time: 09/21/2011  1:05 AM   First MD Initiated Contact with Patient 09/21/11 0101      Chief Complaint  Patient presents with  . Back Pain    (Consider location/radiation/quality/duration/timing/severity/associated sxs/prior treatment) HPI Comments: Patient c/o left lower back pain for one week.  States pain has been increasing in severity.  Describes the pain as sharp and stabbing.  Also c/o occasional burning with urination and increased frequency.  She denies  Injury,  incontinence or feces or urine, numbness or weakness.  States she had a percocet left  from a previous prescription and she took it tonight at 9:00 pm w/o relief.    Patient is a 25 y.o. female presenting with back pain. The history is provided by the patient.  Back Pain  This is a new problem. The current episode started more than 2 days ago. The problem occurs constantly. The problem has been gradually worsening. The pain is associated with no known injury. The pain is present in the lumbar spine. The quality of the pain is described as aching and stabbing. The pain does not radiate. The pain is at a severity of 7/10. The pain is moderate. The symptoms are aggravated by twisting, bending and certain positions. The pain is the same all the time. Associated symptoms include dysuria. Pertinent negatives include no chest pain, no fever, no numbness, no abdominal pain, no abdominal swelling, no bowel incontinence, no perianal numbness, no bladder incontinence, no pelvic pain, no leg pain, no paresthesias, no paresis, no tingling and no weakness. Treatments tried: oral narcotic. The treatment provided no relief. Risk factors include obesity.    Past Medical History  Diagnosis Date  . Herpes     History reviewed. No pertinent past surgical history.  Family History  Problem Relation Age of Onset  . Diabetes Mother   . Diabetes Other   . Hypertension Other     History    Substance Use Topics  . Smoking status: Former Smoker -- 3 years    Types: Cigarettes    Quit date: 07/28/2011  . Smokeless tobacco: Never Used  . Alcohol Use: No    OB History    Grav Para Term Preterm Abortions TAB SAB Ect Mult Living   1 1 1              Review of Systems  Constitutional: Negative for fever, chills and fatigue.  HENT: Negative for sore throat, trouble swallowing, neck pain and neck stiffness.   Respiratory: Negative for shortness of breath and wheezing.   Cardiovascular: Negative for chest pain and palpitations.  Gastrointestinal: Negative for nausea, vomiting, abdominal pain and bowel incontinence.  Genitourinary: Positive for dysuria and frequency. Negative for bladder incontinence, hematuria, flank pain, vaginal bleeding, vaginal discharge, difficulty urinating, vaginal pain, menstrual problem and pelvic pain.  Musculoskeletal: Positive for back pain. Negative for myalgias, joint swelling, arthralgias and gait problem.  Skin: Negative.  Negative for rash.  Neurological: Negative for dizziness, tingling, weakness, numbness and paresthesias.  Hematological: Does not bruise/bleed easily.    Allergies  Review of patient's allergies indicates no known allergies.  Home Medications   Current Outpatient Rx  Name Route Sig Dispense Refill  . OXYCODONE-ACETAMINOPHEN 5-325 MG PO TABS Oral Take 1 tablet by mouth every 4 (four) hours as needed.      Marland Kitchen VALACYCLOVIR HCL 1 G PO TABS Oral Take 1,000 mg by mouth 2 (two) times daily.  BP 128/79  Pulse 115  Temp 97.6 F (36.4 C)  Resp 20  Ht 5\' 6"  (1.676 m)  Wt 263 lb (119.296 kg)  BMI 42.45 kg/m2  SpO2 99%  LMP 07/11/2011  Physical Exam  Nursing note and vitals reviewed. Constitutional: She is oriented to person, place, and time. She appears well-developed and well-nourished. No distress.       obese  HENT:  Head: Normocephalic and atraumatic.  Mouth/Throat: Oropharynx is clear and moist.  Neck:  Normal range of motion. Neck supple.  Cardiovascular: Normal rate, regular rhythm and normal heart sounds.   Pulmonary/Chest: Effort normal and breath sounds normal. No respiratory distress. She exhibits no tenderness.  Abdominal: Soft. She exhibits no distension and no mass. There is no tenderness. There is no rebound and no guarding.  Musculoskeletal: Normal range of motion. She exhibits tenderness. She exhibits no edema.       Lumbar back: She exhibits tenderness. She exhibits normal range of motion, no bony tenderness, no swelling, no deformity and normal pulse.       Back:  Lymphadenopathy:    She has no cervical adenopathy.  Neurological: She is alert and oriented to person, place, and time. She has normal strength. No cranial nerve deficit or sensory deficit. She exhibits normal muscle tone. Coordination normal.  Reflex Scores:      Patellar reflexes are 2+ on the right side and 2+ on the left side.      Achilles reflexes are 2+ on the right side and 2+ on the left side. Skin: Skin is warm and dry.    ED Course  Procedures (including critical care time)   Results for orders placed during the hospital encounter of 09/21/11  POCT PREGNANCY, URINE      Component Value Range   Preg Test, Ur NEGATIVE       MDM   1:18 AM patient has ttp of the left lumbar paraspinal muscles.  No focal neuro deficits on exam.  Pain to left lower back also reproduced with SLR at 20 degrees on left.  DP pulses are strong, sensation intact.  CR<2 sec.    1:49 AM patient sitting up on the stretcher watching TV.  U/A is still pending.  Results to be reviewed by the EDP prior to patient's d/c   Medical screening examination/treatment/procedure(s) were conducted as a shared visit with non-physician practitioner(s) and myself.  I personally evaluated the patient during the encounter Pt c/o left flank pain, notes mild dysuria, ua w bact and wbc. Confirmed nkda w pt. cipro po.    Tammy L. Bushnell,  PA 09/21/11 0151  Suzi Roots, MD 09/21/11 [phone]

## 2011-09-21 NOTE — ED Notes (Signed)
Pt c/o lower left side back pain (stabbing) x 1wk worse tonight at 9pm, no relief with Percocet.

## 2011-11-11 ENCOUNTER — Encounter (HOSPITAL_COMMUNITY): Payer: Self-pay | Admitting: Emergency Medicine

## 2011-11-11 ENCOUNTER — Emergency Department (HOSPITAL_COMMUNITY)
Admission: EM | Admit: 2011-11-11 | Discharge: 2011-11-11 | Disposition: A | Discharge status: Home / Self Care | Payer: Medicaid Other | Attending: Emergency Medicine | Admitting: Emergency Medicine

## 2011-11-11 DIAGNOSIS — R10819 Abdominal tenderness, unspecified site: Secondary | ICD-10-CM | POA: Insufficient documentation

## 2011-11-11 DIAGNOSIS — R110 Nausea: Secondary | ICD-10-CM | POA: Insufficient documentation

## 2011-11-11 DIAGNOSIS — R6883 Chills (without fever): Secondary | ICD-10-CM | POA: Insufficient documentation

## 2011-11-11 DIAGNOSIS — R109 Unspecified abdominal pain: Secondary | ICD-10-CM | POA: Insufficient documentation

## 2011-11-11 DIAGNOSIS — Z79899 Other long term (current) drug therapy: Secondary | ICD-10-CM | POA: Insufficient documentation

## 2011-11-11 LAB — URINALYSIS, ROUTINE W REFLEX MICROSCOPIC
Glucose, UA: NEGATIVE mg/dL
Leukocytes, UA: NEGATIVE
Nitrite: NEGATIVE
Protein, ur: NEGATIVE mg/dL
pH: 6.5 (ref 5.0–8.0)

## 2011-11-11 LAB — WET PREP, GENITAL: Trich, Wet Prep: NONE SEEN

## 2011-11-11 LAB — URINE MICROSCOPIC-ADD ON

## 2011-11-11 MED ORDER — HYDROCODONE-ACETAMINOPHEN 5-325 MG PO TABS: 1.0000 | ORAL_TABLET | Freq: Four times a day (QID) | ORAL | Status: DC | PRN

## 2011-11-11 MED ORDER — OXYCODONE-ACETAMINOPHEN 5-325 MG PO TABS
1.0000 | ORAL_TABLET | Freq: Once | ORAL | Status: AC
  Administered 2011-11-11: 1 via ORAL
  Filled 2011-11-11: qty 1

## 2011-11-11 MED ORDER — ONDANSETRON 8 MG PO TBDP
8.0000 mg | ORAL_TABLET | Freq: Once | ORAL | Status: AC
  Administered 2011-11-11: 8 mg via ORAL
  Filled 2011-11-11: qty 1

## 2011-11-11 NOTE — ED Notes (Signed)
Patient c/o right sided abdominal pain that started at 1500; states has some nausea, but denies vomiting or diarrhea.

## 2011-11-11 NOTE — ED Notes (Signed)
Patient with right lower abd pain, hx of same-switched depo to pill, same pain again, denies vaginal d/c

## 2011-11-11 NOTE — ED Notes (Signed)
RN present during entire pelvic exam, pt with discomfort to right lower abdomen

## 2011-11-11 NOTE — ED Provider Notes (Signed)
History     CSN: [id number]  Arrival date & time 11/11/11  1811   First MD Initiated Contact with Patient 11/11/11 2050     Chief Complaint  Patient presents with  . Abdominal Pain  . Nausea    Patient is a 25 y.o. female presenting with abdominal pain. The history is provided by the patient.  Abdominal Pain The primary symptoms of the illness include abdominal pain and nausea. The primary symptoms of the illness do not include fever, vomiting, diarrhea, dysuria, vaginal discharge or vaginal bleeding. The current episode started 3 to 5 hours ago. The onset of the illness was gradual. The problem has been gradually worsening.  Additional symptoms associated with the illness include chills.  pt reports onset of abd pain earlier today Similar to episode in 08/2011, had workup but no diagnosis given per patient   Past Medical History  Diagnosis Date  . Herpes     History reviewed. No pertinent past surgical history.  Family History  Problem Relation Age of Onset  . Diabetes Mother   . Diabetes Other   . Hypertension Other     History  Substance Use Topics  . Smoking status: Former Smoker -- 3 years    Types: Cigarettes    Quit date: 07/28/2011  . Smokeless tobacco: Never Used  . Alcohol Use: No    OB History    Grav Para Term Preterm Abortions TAB SAB Ect Mult Living   1 1 1              Review of Systems  Constitutional: Positive for chills. Negative for fever.  Gastrointestinal: Positive for nausea and abdominal pain. Negative for vomiting and diarrhea.  Genitourinary: Negative for dysuria, vaginal bleeding and vaginal discharge.    Allergies  Review of patient's allergies indicates no known allergies.  Home Medications   Current Outpatient Rx  Name Route Sig Dispense Refill  . NORGESTIMATE-ETH ESTRADIOL 0.25-35 MG-MCG PO TABS Oral Take 1 tablet by mouth at bedtime. AT 11PM     . VALACYCLOVIR HCL 1 G PO TABS Oral Take 1,000 mg by mouth at bedtime.        BP 101/63  Pulse 96  Temp(Src) 98.2 F (36.8 C) (Oral)  Resp 20  Ht 5\' 6"  (1.676 m)  Wt 261 lb (118.389 kg)  BMI 42.13 kg/m2  SpO2 100%  LMP 11/04/2011  Physical Exam  CONSTITUTIONAL: Well developed/well nourished HEAD AND FACE: Normocephalic/atraumatic EYES: EOMI/PERRL ENMT: Mucous membranes moist NECK: supple no meningeal signs SPINE:entire spine nontender CV: S1/S2 noted, no murmurs/rubs/gallops noted LUNGS: Lungs are clear to auscultation bilaterally, no apparent distress ABDOMEN: soft, mild tenderness noted to right mid-quadrant GU:no cva tenderness, chaperone present, no vag bleeding/discharge, no cmt, no adnexal mass noted Mild right adnexal tenderness NEURO: Pt is awake/alert, moves all extremitiesx4 EXTREMITIES: pulses normal, full ROM SKIN: warm, color normal PSYCH: no abnormalities of mood noted   ED Course  Procedures   Labs Reviewed  WET PREP, GENITAL - Abnormal; Notable for the following:    WBC, Wet Prep HPF POC FEW (*)    All other components within normal limits  PREGNANCY, URINE  URINALYSIS, ROUTINE W REFLEX MICROSCOPIC  GC/CHLAMYDIA PROBE AMP, GENITAL   9:41 PM Pt with CT/US imaging from 08/2011 that showed no specific cause for her abd pain  10:10 PM Awaiting urinalysis Pt stable My suspicion for acute abd/gyn process low given she has had this previously D/w dr zammit, assumed care in signout to f/u  on urine labs If pain improved, can be discharged with f/u    MDM  Nursing notes reviewed and considered in documentation All labs/vitals reviewed and considered Previous records reviewed and considered         Joya Gaskins, MD 11/11/11 2212

## 2011-11-13 LAB — GC/CHLAMYDIA PROBE AMP, GENITAL: GC Probe Amp, Genital: NEGATIVE

## 2011-11-15 ENCOUNTER — Emergency Department (HOSPITAL_COMMUNITY)
Admission: EM | Admit: 2011-11-15 | Discharge: 2011-11-15 | Disposition: A | Discharge status: Home / Self Care | Payer: Medicaid Other | Attending: Emergency Medicine | Admitting: Emergency Medicine

## 2011-11-15 ENCOUNTER — Encounter (HOSPITAL_COMMUNITY): Payer: Self-pay | Admitting: Emergency Medicine

## 2011-11-15 DIAGNOSIS — R109 Unspecified abdominal pain: Secondary | ICD-10-CM | POA: Insufficient documentation

## 2011-11-15 DIAGNOSIS — M545 Low back pain, unspecified: Secondary | ICD-10-CM | POA: Insufficient documentation

## 2011-11-15 DIAGNOSIS — Z79899 Other long term (current) drug therapy: Secondary | ICD-10-CM | POA: Insufficient documentation

## 2011-11-15 DIAGNOSIS — N390 Urinary tract infection, site not specified: Secondary | ICD-10-CM

## 2011-11-15 DIAGNOSIS — R10813 Right lower quadrant abdominal tenderness: Secondary | ICD-10-CM | POA: Insufficient documentation

## 2011-11-15 LAB — URINALYSIS, ROUTINE W REFLEX MICROSCOPIC
Glucose, UA: NEGATIVE mg/dL
Nitrite: POSITIVE — AB
Protein, ur: NEGATIVE mg/dL
Urobilinogen, UA: 0.2 mg/dL (ref 0.0–1.0)

## 2011-11-15 LAB — POCT PREGNANCY, URINE: Preg Test, Ur: NEGATIVE

## 2011-11-15 MED ORDER — ONDANSETRON HCL 4 MG/2ML IJ SOLN
4.0000 mg | Freq: Once | INTRAMUSCULAR | Status: AC
  Administered 2011-11-15: 4 mg via INTRAVENOUS
  Filled 2011-11-15: qty 2

## 2011-11-15 MED ORDER — MORPHINE SULFATE 4 MG/ML IJ SOLN
4.0000 mg | Freq: Once | INTRAMUSCULAR | Status: AC
  Administered 2011-11-15: 4 mg via INTRAVENOUS
  Filled 2011-11-15: qty 1

## 2011-11-15 MED ORDER — HYDROCODONE-ACETAMINOPHEN 5-325 MG PO TABS: 1.0000 | ORAL_TABLET | ORAL | Status: AC | PRN

## 2011-11-15 MED ORDER — PROMETHAZINE HCL 25 MG PO TABS: 12.5000 mg | ORAL_TABLET | Freq: Four times a day (QID) | ORAL | Status: DC | PRN

## 2011-11-15 MED ORDER — CIPROFLOXACIN HCL 250 MG PO TABS: 250.0000 mg | ORAL_TABLET | Freq: Two times a day (BID) | ORAL | Status: AC

## 2011-11-15 MED ORDER — CIPROFLOXACIN HCL 250 MG PO TABS
500.0000 mg | ORAL_TABLET | Freq: Once | ORAL | Status: AC
  Administered 2011-11-15: 500 mg via ORAL
  Filled 2011-11-15: qty 2

## 2011-11-15 MED ORDER — SODIUM CHLORIDE 0.9 % IV SOLN
Freq: Once | INTRAVENOUS | Status: AC
  Administered 2011-11-15: 18:00:00 via INTRAVENOUS

## 2011-11-15 NOTE — ED Provider Notes (Signed)
History   This chart was scribed for [employer]. Colon Branch, MD scribed by Magnus Sinning. The patient was seen in room [facility]    CSN: [id number]  Arrival date & time 11/15/11  1305   First MD Initiated Contact with Patient 11/15/11 1729      Chief Complaint  Patient presents with  . Back Pain  . Flank Pain    (Consider location/radiation/quality/duration/timing/severity/associated sxs/prior treatment) HPI [name] is a 25 y.o. female who presents to the Emergency Department complaining of a constant gradually worsening right flank pain that radiates into her lower back onset 5 days ago with associated nausea, but no vomiting. Pt reports that she was seen 4 days ago for right side flank pain and treated with medication (hydrocondone) with no improvement. She says that she was seen by her ob-gyn before for the same pain and that they suspected that her Depo shot was the cause.  She reports that as a result she began takingTriTemple-Inland, which she reports she is still currently taking. Pt denies any family hx of kidney stones, urinary symptoms, and  possibility of pregnancy.  PCP: Dr. Renard Matter  Past Medical History  Diagnosis Date  . Herpes     History reviewed. No pertinent past surgical history.  Family History  Problem Relation Age of Onset  . Diabetes Mother   . Diabetes Other   . Hypertension Other     History  Substance Use Topics  . Smoking status: Former Smoker -- 3 years    Types: Cigarettes    Quit date: 07/28/2011  . Smokeless tobacco: Never Used  . Alcohol Use: No    OB History    Grav Para Term Preterm Abortions TAB SAB Ect Mult Living   1 1 1       1       Review of Systems 10 Systems reviewed and are negative for acute change except as noted in the HPI.  Allergies  Review of patient's allergies indicates no known allergies.  Home Medications   Current Outpatient Rx  Name Route Sig Dispense Refill  . HYDROCODONE-ACETAMINOPHEN 5-325 MG PO  TABS Oral Take 1 tablet by mouth every 6 (six) hours as needed for pain. 10 tablet 0  . NORGESTIMATE-ETH ESTRADIOL 0.25-35 MG-MCG PO TABS Oral Take 1 tablet by mouth at bedtime. AT 11PM     . VALACYCLOVIR HCL 1 G PO TABS Oral Take 1,000 mg by mouth at bedtime.       BP 125/60  Pulse 97  Temp(Src) 98.3 F (36.8 C) (Oral)  Resp 17  Ht 5\' 6"  (1.676 m)  Wt 263 lb (119.296 kg)  BMI 42.45 kg/m2  SpO2 100%  LMP 11/04/2011  Physical Exam  Nursing note and vitals reviewed. Constitutional: She is oriented to person, place, and time. She appears well-developed and well-nourished. No distress.  HENT:  Head: Normocephalic and atraumatic.  Mouth/Throat: Oropharynx is clear and moist.  Eyes: EOM are normal. Pupils are equal, round, and reactive to light.  Neck: Neck supple. No tracheal deviation present.  Cardiovascular: Normal rate, regular rhythm and normal heart sounds.  Exam reveals no gallop and no friction rub.   No murmur heard. Pulmonary/Chest: Effort normal and breath sounds normal. No respiratory distress.  Abdominal: Soft. Bowel sounds are normal. She exhibits no distension. There is tenderness. There is no rebound and no guarding.       Right-sided flank CVA tenderness to percussion  RLQ discomfort   Musculoskeletal: Normal range of  motion. She exhibits no edema.  Neurological: She is alert and oriented to person, place, and time. No sensory deficit.  Skin: Skin is warm and dry.  Psychiatric: She has a normal mood and affect. Her behavior is normal.    ED Course  Procedures (including critical care time) DIAGNOSTIC STUDIES: Oxygen Saturation is 100% on room air, normal by my interpretation.    COORDINATION OF CARE: 19:43: Physician discusses lab results and plans for the patient to be treated with an abx. Patient agrees with the physician's plan of action at this time.  Results for orders placed during the hospital encounter of 11/15/11  URINALYSIS, ROUTINE W REFLEX  MICROSCOPIC      Component Value Range   Color, Urine YELLOW  YELLOW    APPearance CLOUDY (*) CLEAR    Specific Gravity, Urine >1.030 (*) 1.005 - 1.030    pH 6.0  5.0 - 8.0    Glucose, UA NEGATIVE  NEGATIVE (mg/dL)   Hgb urine dipstick NEGATIVE  NEGATIVE    Bilirubin Urine NEGATIVE  NEGATIVE    Ketones, ur NEGATIVE  NEGATIVE (mg/dL)   Protein, ur NEGATIVE  NEGATIVE (mg/dL)   Urobilinogen, UA 0.2  0.0 - 1.0 (mg/dL)   Nitrite POSITIVE (*) NEGATIVE    Leukocytes, UA SMALL (*) NEGATIVE   POCT PREGNANCY, URINE      Component Value Range   Preg Test, Ur NEGATIVE    URINE MICROSCOPIC-ADD ON      Component Value Range   Squamous Epithelial / LPF MANY (*) RARE    WBC, UA 21-50  <3 (WBC/hpf)   RBC / HPF 7-10  <3 (RBC/hpf)   Bacteria, UA MANY (*) RARE    New Prescriptions   CIPROFLOXACIN (CIPRO) 250 MG TABLET    Take 1 tablet (250 mg total) by mouth every 12 (twelve) hours.   HYDROCODONE-ACETAMINOPHEN (NORCO) 5-325 MG PER TABLET    Take 1 tablet by mouth every 4 (four) hours as needed for pain.   PROMETHAZINE (PHENERGAN) 25 MG TABLET    Take 0.5 tablets (12.5 mg total) by mouth every 6 (six) hours as needed for nausea.      MDM  Patient with right CVA tenderness and lower abdominal pain. Urine positive for infection c/w early pyelonephritis.Initiated antibiotic therapy. Given IVF, analgesic and antiemetic.Pt feels improved after observation and/or treatment in ED.Pt stable in ED with no significant deterioration in condition.The patient appears reasonably screened and/or stabilized for discharge and I doubt any other medical condition or other [facility] requiring further screening, evaluation, or treatment in the ED at this time prior to discharge.  I personally performed the services described in this documentation, which was scribed in my presence. The recorded information has been reviewed and considered.  MDM Reviewed: previous chart, nursing note and vitals Interpretation:  labs          Nicoletta Dress. Colon Branch, MD 11/15/11 [phone]

## 2011-11-15 NOTE — ED Notes (Signed)
Patient c/o right flank pain that radiates into lower back. Patient reports being seen here and diagnosed with abd pain. Per patient had U/A and pelvic exam done here. Patient denies any fevers or urinary symptoms. Per patient nausea, no vomiting.

## 2011-11-15 NOTE — ED Notes (Signed)
Pt given warm blanket. NAD, no other needs noted.

## 2011-11-25 ENCOUNTER — Emergency Department (HOSPITAL_COMMUNITY): Payer: Medicaid Other

## 2011-11-25 ENCOUNTER — Emergency Department (HOSPITAL_COMMUNITY)
Admission: EM | Admit: 2011-11-25 | Discharge: 2011-11-25 | Disposition: A | Discharge status: Home / Self Care | Payer: Medicaid Other | Attending: Emergency Medicine | Admitting: Emergency Medicine

## 2011-11-25 ENCOUNTER — Encounter (HOSPITAL_COMMUNITY): Payer: Self-pay

## 2011-11-25 DIAGNOSIS — N949 Unspecified condition associated with female genital organs and menstrual cycle: Secondary | ICD-10-CM | POA: Insufficient documentation

## 2011-11-25 DIAGNOSIS — R1031 Right lower quadrant pain: Secondary | ICD-10-CM | POA: Insufficient documentation

## 2011-11-25 DIAGNOSIS — R10813 Right lower quadrant abdominal tenderness: Secondary | ICD-10-CM | POA: Insufficient documentation

## 2011-11-25 DIAGNOSIS — R102 Pelvic and perineal pain: Secondary | ICD-10-CM

## 2011-11-25 DIAGNOSIS — Z87891 Personal history of nicotine dependence: Secondary | ICD-10-CM | POA: Insufficient documentation

## 2011-11-25 DIAGNOSIS — B009 Herpesviral infection, unspecified: Secondary | ICD-10-CM | POA: Insufficient documentation

## 2011-11-25 LAB — DIFFERENTIAL
Basophils Relative: 0 % (ref 0–1)
Monocytes Relative: 5 % (ref 3–12)
Neutro Abs: 7 10*3/uL (ref 1.7–7.7)
Neutrophils Relative %: 68 % (ref 43–77)

## 2011-11-25 LAB — URINALYSIS, ROUTINE W REFLEX MICROSCOPIC
Bilirubin Urine: NEGATIVE
Glucose, UA: NEGATIVE mg/dL
Hgb urine dipstick: NEGATIVE
Specific Gravity, Urine: 1.025 (ref 1.005–1.030)
pH: 6 (ref 5.0–8.0)

## 2011-11-25 LAB — POCT PREGNANCY, URINE: Preg Test, Ur: NEGATIVE

## 2011-11-25 LAB — BASIC METABOLIC PANEL
BUN: 11 mg/dL (ref 6–23)
Chloride: 104 mEq/L (ref 96–112)
GFR calc Af Amer: >90 mL/min (ref >90)
Potassium: 4.1 mEq/L (ref 3.5–5.1)

## 2011-11-25 LAB — CBC
Hemoglobin: 13.1 g/dL (ref 12.0–15.0)
MCHC: 33.9 g/dL (ref 30.0–36.0)
RBC: 4.46 MIL/uL (ref 3.87–5.11)
WBC: 10.3 10*3/uL (ref 4.0–10.5)

## 2011-11-25 MED ORDER — OXYCODONE-ACETAMINOPHEN 5-325 MG PO TABS
2.0000 | ORAL_TABLET | Freq: Once | ORAL | Status: AC
  Administered 2011-11-25: 2 via ORAL
  Filled 2011-11-25: qty 2

## 2011-11-25 MED ORDER — SODIUM CHLORIDE 0.9 % IV SOLN: Freq: Once | INTRAVENOUS | Status: DC

## 2011-11-25 MED ORDER — IOHEXOL 300 MG/ML  SOLN
100.0000 mL | Freq: Once | INTRAMUSCULAR | Status: AC | PRN
  Administered 2011-11-25: 100 mL via INTRAVENOUS

## 2011-11-25 NOTE — ED Provider Notes (Signed)
History     CSN: [id number]  Arrival date & time 11/25/11  1119   First MD Initiated Contact with Patient 11/25/11 1334      Chief Complaint  Patient presents with  . Abdominal Pain    (Consider location/radiation/quality/duration/timing/severity/associated sxs/prior treatment) Patient is a 26 y.o. female presenting with abdominal pain. The history is provided by the patient.  Abdominal Pain The primary symptoms of the illness include abdominal pain.   patient presents with abdominal pain described as sharp and stabbing located at her right lower quadrant x3 weeks. Seen in the ED for same and diagnosed with pyelonephritis. Denies fever or, vomiting, diarrhea. Pain worse with movement better with rest. Denies any urinary symptoms. No medications taken prior to arrival. Denies any rashes. Patient has a history of ovarian cysts on the same side  Past Medical History  Diagnosis Date  . Herpes     History reviewed. No pertinent past surgical history.  Family History  Problem Relation Age of Onset  . Diabetes Mother   . Diabetes Other   . Hypertension Other     History  Substance Use Topics  . Smoking status: Former Smoker -- 3 years    Types: Cigarettes    Quit date: 07/28/2011  . Smokeless tobacco: Never Used  . Alcohol Use: No    OB History    Grav Para Term Preterm Abortions TAB SAB Ect Mult Living   1 1 1       1       Review of Systems  Gastrointestinal: Positive for abdominal pain.  All other systems reviewed and are negative.    Allergies  Review of patient's allergies indicates no known allergies.  Home Medications   Current Outpatient Rx  Name Route Sig Dispense Refill  . CIPROFLOXACIN HCL 250 MG PO TABS Oral Take 1 tablet (250 mg total) by mouth every 12 (twelve) hours. 10 tablet 0  . HYDROCODONE-ACETAMINOPHEN 5-325 MG PO TABS Oral Take 1 tablet by mouth every 4 (four) hours as needed for pain. 15 tablet 0  . NORGESTIMATE-ETH ESTRADIOL 0.25-35 MG-MCG  PO TABS Oral Take 1 tablet by mouth at bedtime. AT 11PM     . VALACYCLOVIR HCL 1 G PO TABS Oral Take 1,000 mg by mouth at bedtime.       BP 124/66  Pulse 91  Temp(Src) 97.9 F (36.6 C) (Oral)  Resp 18  Ht 5\' 7"  (1.702 m)  Wt 263 lb (119.296 kg)  BMI 41.19 kg/m2  SpO2 98%  LMP 11/04/2011  Physical Exam  Nursing note and vitals reviewed. Constitutional: She is oriented to person, place, and time. Vital signs are normal. She appears well-developed and well-nourished.  Non-toxic appearance. No distress.  HENT:  Head: Normocephalic and atraumatic.  Eyes: Conjunctivae, EOM and lids are normal. Pupils are equal, round, and reactive to light.  Neck: Normal range of motion. Neck supple. No tracheal deviation present. No mass present.  Cardiovascular: Normal rate, regular rhythm and normal heart sounds.  Exam reveals no gallop.   No murmur heard. Pulmonary/Chest: Effort normal and breath sounds normal. No stridor. No respiratory distress. She has no decreased breath sounds. She has no wheezes. She has no rhonchi. She has no rales.  Abdominal: Soft. Normal appearance and bowel sounds are normal. She exhibits no distension. There is tenderness in the right lower quadrant. There is no rigidity, no rebound, no guarding and no CVA tenderness.  Musculoskeletal: Normal range of motion. She exhibits no edema and no  tenderness.  Neurological: She is alert and oriented to person, place, and time. She has normal strength. No cranial nerve deficit or sensory deficit. GCS eye subscore is 4. GCS verbal subscore is 5. GCS motor subscore is 6.  Skin: Skin is warm and dry. No abrasion and no rash noted.  Psychiatric: She has a normal mood and affect. Her speech is normal and behavior is normal.    ED Course  Procedures (including critical care time)   Labs Reviewed  URINALYSIS, ROUTINE W REFLEX MICROSCOPIC  POCT PREGNANCY, URINE  POCT PREGNANCY, URINE   No results found.   No diagnosis  found.    MDM  Patient's labs and x-rays were reviewed. Patient notes that she has a long-standing history of this right lower quadrant pain. No acute intra-abdominal process noted by CAT scan or ultrasound. Patient will followup with her GYN Dr. denies any vaginal bleeding or discharge we'll defer the pelvic exam to        Toy Baker, MD 11/25/11 1718

## 2011-11-25 NOTE — ED Notes (Signed)
Pt reports pain in RLQ radiating around to r lower back x 1 week.  Denies pain or burning with urination, denies vaginal discharge.  Pt says was diagnosed with UTI last week and took all of her antibiotics.  Pt says is no better.

## 2011-11-27 ENCOUNTER — Encounter (HOSPITAL_COMMUNITY): Payer: Self-pay

## 2011-11-27 DIAGNOSIS — R110 Nausea: Secondary | ICD-10-CM | POA: Insufficient documentation

## 2011-11-27 DIAGNOSIS — R109 Unspecified abdominal pain: Secondary | ICD-10-CM | POA: Insufficient documentation

## 2011-11-27 NOTE — ED Notes (Signed)
Pt presents with right side pain that radiates to right abdomen and ribs with nausea. Pt denies v/d. Pt states she was just seen here for same.

## 2011-11-28 ENCOUNTER — Emergency Department (HOSPITAL_COMMUNITY)
Admission: EM | Admit: 2011-11-28 | Discharge: 2011-11-28 | Disposition: A | Discharge status: Home / Self Care | Payer: Medicaid Other | Attending: Emergency Medicine | Admitting: Emergency Medicine

## 2011-11-28 ENCOUNTER — Emergency Department (HOSPITAL_COMMUNITY): Payer: Medicaid Other

## 2011-11-28 ENCOUNTER — Emergency Department (HOSPITAL_COMMUNITY)
Admission: EM | Admit: 2011-11-28 | Discharge: 2011-11-28 | Discharge status: Against Medical Advice | Payer: Medicaid Other | Source: Home / Self Care | Attending: Emergency Medicine | Admitting: Emergency Medicine

## 2011-11-28 ENCOUNTER — Encounter (HOSPITAL_COMMUNITY): Payer: Self-pay | Admitting: *Deleted

## 2011-11-28 DIAGNOSIS — M5430 Sciatica, unspecified side: Secondary | ICD-10-CM | POA: Insufficient documentation

## 2011-11-28 DIAGNOSIS — M549 Dorsalgia, unspecified: Secondary | ICD-10-CM | POA: Insufficient documentation

## 2011-11-28 DIAGNOSIS — M545 Low back pain, unspecified: Secondary | ICD-10-CM | POA: Insufficient documentation

## 2011-11-28 DIAGNOSIS — R110 Nausea: Secondary | ICD-10-CM | POA: Insufficient documentation

## 2011-11-28 DIAGNOSIS — R109 Unspecified abdominal pain: Secondary | ICD-10-CM | POA: Insufficient documentation

## 2011-11-28 DIAGNOSIS — Z79899 Other long term (current) drug therapy: Secondary | ICD-10-CM | POA: Insufficient documentation

## 2011-11-28 LAB — COMPREHENSIVE METABOLIC PANEL
Alkaline Phosphatase: 90 U/L (ref 39–117)
BUN: 11 mg/dL (ref 6–23)
CO2: 23 mEq/L (ref 19–32)
Chloride: 105 mEq/L (ref 96–112)
GFR calc Af Amer: >90 mL/min (ref >90)
GFR calc non Af Amer: >90 mL/min (ref >90)
Glucose, Bld: 79 mg/dL (ref 70–99)
Potassium: 4.1 mEq/L (ref 3.5–5.1)
Total Bilirubin: 0.4 mg/dL (ref 0.3–1.2)

## 2011-11-28 LAB — URINALYSIS, ROUTINE W REFLEX MICROSCOPIC
Bilirubin Urine: NEGATIVE
Ketones, ur: NEGATIVE mg/dL
Nitrite: NEGATIVE
Urobilinogen, UA: 1 mg/dL (ref 0.0–1.0)

## 2011-11-28 LAB — CBC
HCT: 41.4 % (ref 36.0–46.0)
Hemoglobin: 14.4 g/dL (ref 12.0–15.0)
MCHC: 34.8 g/dL (ref 30.0–36.0)
WBC: 10.1 10*3/uL (ref 4.0–10.5)

## 2011-11-28 LAB — LIPASE, BLOOD: Lipase: 44 U/L (ref 11–59)

## 2011-11-28 MED ORDER — ONDANSETRON 4 MG PO TBDP
4.0000 mg | ORAL_TABLET | Freq: Once | ORAL | Status: AC
  Administered 2011-11-28: 4 mg via ORAL
  Filled 2011-11-28: qty 1

## 2011-11-28 MED ORDER — OXYCODONE-ACETAMINOPHEN 5-325 MG PO TABS: 1.0000 | ORAL_TABLET | ORAL | Status: AC | PRN

## 2011-11-28 MED ORDER — ONDANSETRON HCL 4 MG PO TABS: 4.0000 mg | ORAL_TABLET | Freq: Four times a day (QID) | ORAL | Status: AC

## 2011-11-28 MED ORDER — HYDROMORPHONE HCL PF 1 MG/ML IJ SOLN
1.0000 mg | Freq: Once | INTRAMUSCULAR | Status: AC
Start: 2011-11-28 — End: 2011-11-28
  Administered 2011-11-28: 1 mg via INTRAMUSCULAR
  Filled 2011-11-28: qty 1

## 2011-11-28 NOTE — ED Notes (Signed)
Reports having right side abd pain, radiates around to her side and flank area. Denies any urinary symptoms. Seen at [facility] yesterday for same.

## 2011-11-28 NOTE — ED Provider Notes (Addendum)
History     CSN: [id number]  Arrival date & time 11/28/11  1219   First MD Initiated Contact with Patient 11/28/11 1404      Chief Complaint  Patient presents with  . Abdominal Pain   Patient presents with abdominal pain. She states has been there for several days up to a week. She was seen in any pen for the same thing yesterday and apparently had CAT scan or ultrasound, which were reported to her as being normal. She also has an appointment with her primary doctor on Monday. She states the pain is "making my right arm numb." She also has chronic low back pain, which has been worsening with some radiation to the right lower extremity. Patient states she did not receive any prescriptions from her last any pen visit, but was on hydrocodone in the past, which is currently out of. She has had no urinary symptoms, no vaginal bleeding. No vomiting. No fever. No syncope. No dizziness or weakness (Consider location/radiation/quality/duration/timing/severity/associated sxs/prior treatment) HPI  Past Medical History  Diagnosis Date  . Herpes     History reviewed. No pertinent past surgical history.  Family History  Problem Relation Age of Onset  . Diabetes Mother   . Diabetes Other   . Hypertension Other     History  Substance Use Topics  . Smoking status: Former Smoker -- 3 years    Types: Cigarettes    Quit date: 07/28/2011  . Smokeless tobacco: Never Used  . Alcohol Use: No    OB History    Grav Para Term Preterm Abortions TAB SAB Ect Mult Living   1 1 1       1       Review of Systems  Allergies  Review of patient's allergies indicates no known allergies.  Home Medications   Current Outpatient Rx  Name Route Sig Dispense Refill  . ACETAMINOPHEN 500 MG PO TABS Oral Take 500 mg by mouth every 6 (six) hours as needed. For pain    . NORGESTIMATE-ETH ESTRADIOL 0.25-35 MG-MCG PO TABS Oral Take 1 tablet by mouth at bedtime. AT 11PM     . VALACYCLOVIR HCL 1 G PO TABS Oral  Take 1,000 mg by mouth at bedtime.       BP 109/77  Pulse 92  Temp(Src) 97.1 F (36.2 C) (Oral)  Resp 18  SpO2 95%  LMP 11/04/2011  Physical Exam  Nursing note and vitals reviewed. Constitutional: She is oriented to person, place, and time. She appears well-developed and well-nourished.       Morbidly obese. Appears to be uncomfortable, but in no acute distress.  HENT:  Head: Normocephalic and atraumatic.  Eyes: Conjunctivae and EOM are normal. Pupils are equal, round, and reactive to light.  Neck: Neck supple.  Cardiovascular: Normal rate and regular rhythm.  Exam reveals no gallop and no friction rub.   No murmur heard. Pulmonary/Chest: Breath sounds normal. She has no wheezes. She has no rales. She exhibits no tenderness.  Abdominal: Soft. Bowel sounds are normal. She exhibits no distension. There is no tenderness. There is no rebound and no guarding.       Abdomen is flat, soft, nondistended, nontender.  Musculoskeletal: Normal range of motion.       Diffuse right low back tenderness to palpation, muscular. No spinal tenderness.  Neurological: She is alert and oriented to person, place, and time. No cranial nerve deficit. Coordination normal.  Skin: Skin is warm and dry. No rash noted.  Psychiatric:  She has a normal mood and affect.    ED Course  Procedures (including critical care time)   Labs Reviewed  CBC  COMPREHENSIVE METABOLIC PANEL  LIPASE, BLOOD  URINALYSIS, ROUTINE W REFLEX MICROSCOPIC  POCT PREGNANCY, URINE   No results found.   No diagnosis found.    MDM  Pt is seen and examined;  Initial history and physical completed.  Will follow.     Will administer pain medications. Check labs and urine testing. Will obtain x-rays. However, fairly extensive workup was done at any pen. Will review the studies, and placed those in the note.    *RADIOLOGY REPORT*  Clinical Data: Right lower quadrant pain  CT ABDOMEN AND PELVIS WITH CONTRAST  Technique:  Multidetector CT imaging of the abdomen and pelvis was  performed following the standard protocol during bolus  administration of intravenous contrast. Sagittal and coronal MPR  images reconstructed from axial data set.  Contrast: OMNIPAQUE IOHEXOL 300 MG/ML IV SOLN Dilute oral  contrast.  Comparison: 08/31/2011  Findings:  Lung bases clear.  Spleen appears enlarged, 15.2 x 9.1 x 12.1 cm.  Liver, spleen, pancreas, kidneys, and adrenal glands otherwise  normal.  Thin normal-appearing retrocecal appendix, unchanged.  Normal appearance of bladder, uterus, adnexae, and ovaries.  Scattered normal-sized mesenteric lymph nodes.  Stomach and bowel loops unremarkable.  No mass, adenopathy, free fluid or inflammatory process.  No hernia or acute bony lesion.  IMPRESSION:  Mild splenic enlargement.  Otherwise normal exam.  Original Report Authenticated By: Lollie Marrow, M   ========================   *RADIOLOGY REPORT*  Clinical Data: Pain right lower quadrant question ovarian cyst; LMP  11/04/2011  TRANSABDOMINAL AND TRANSVAGINAL ULTRASOUND OF PELVIS  Technique: Both transabdominal and transvaginal ultrasound  examinations of the pelvis were performed. Transabdominal technique  was performed for global imaging of the pelvis including uterus,  ovaries, adnexal regions, and pelvic cul-de-sac.  Comparison: 08/28/2011  It was necessary to proceed with endovaginal exam following the  transabdominal exam to visualize the uterus, endometrium, and  ovaries.  Findings:  Uterus: 6.7 cm length by 2.3 cm AP by 4.8 cm transverse. Normal  morphology without mass  Endometrium: 2 mm thick, normal. No endometrial fluid.  Right ovary: 2.0 x 1.2 x 1.4 cm. Normal morphology without mass.  Left ovary: 2.1 x 1.0 x 1.2 cm. Normal morphology without mass.  Other findings: No free fluid or adnexal masses.  IMPRESSION:  Normal exam.    Results for orders placed during the hospital encounter of  11/28/11  CBC      Component Value Range   WBC 10.1  4.0 - 10.5 (K/uL)   RBC 4.79  3.87 - 5.11 (MIL/uL)   Hemoglobin 14.4  12.0 - 15.0 (g/dL)   HCT 09.8  11.9 - 14.7 (%)   MCV 86.4  78.0 - 100.0 (fL)   MCH 30.1  26.0 - 34.0 (pg)   MCHC 34.8  30.0 - 36.0 (g/dL)   RDW 82.9  56.2 - 13.0 (%)   Platelets 250  150 - 400 (K/uL)  COMPREHENSIVE METABOLIC PANEL      Component Value Range   Sodium 138  135 - 145 (mEq/L)   Potassium 4.1  3.5 - 5.1 (mEq/L)   Chloride 105  96 - 112 (mEq/L)   CO2 23  19 - 32 (mEq/L)   Glucose, Bld 79  70 - 99 (mg/dL)   BUN 11  6 - 23 (mg/dL)   Creatinine, Ser 8.65  0.50 -  1.10 (mg/dL)   Calcium 9.6  8.4 - 09.8 (mg/dL)   Total Protein 7.4  6.0 - 8.3 (g/dL)   Albumin 3.3 (*) 3.5 - 5.2 (g/dL)   AST 27  0 - 37 (U/L)   ALT 12  0 - 35 (U/L)   Alkaline Phosphatase 90  39 - 117 (U/L)   Total Bilirubin 0.4  0.3 - 1.2 (mg/dL)   GFR calc non Af Amer >90  >90 (mL/min)   GFR calc Af Amer >90  >90 (mL/min)  LIPASE, BLOOD      Component Value Range   Lipase 44  11 - 59 (U/L)  URINALYSIS, ROUTINE W REFLEX MICROSCOPIC      Component Value Range   Color, Urine YELLOW  YELLOW    APPearance CLEAR  CLEAR    Specific Gravity, Urine 1.024  1.005 - 1.030    pH 6.0  5.0 - 8.0    Glucose, UA NEGATIVE  NEGATIVE (mg/dL)   Hgb urine dipstick NEGATIVE  NEGATIVE    Bilirubin Urine NEGATIVE  NEGATIVE    Ketones, ur NEGATIVE  NEGATIVE (mg/dL)   Protein, ur NEGATIVE  NEGATIVE (mg/dL)   Urobilinogen, UA 1.0  0.0 - 1.0 (mg/dL)   Nitrite NEGATIVE  NEGATIVE    Leukocytes, UA NEGATIVE  NEGATIVE    US Transvaginal Non-ob  11/25/2011  *RADIOLOGY REPORT*  Clinical Data: Pain right lower quadrant question ovarian cyst; LMP 11/04/2011  TRANSABDOMINAL AND TRANSVAGINAL ULTRASOUND OF PELVIS Technique:  Both transabdominal and transvaginal ultrasound examinations of the pelvis were performed. Transabdominal technique was performed for global imaging of the pelvis including uterus, ovaries, adnexal  regions, and pelvic cul-de-sac.  Comparison: 08/28/2011   It was necessary to proceed with endovaginal exam following the transabdominal exam to visualize the uterus, endometrium, and ovaries.  Findings:  Uterus: 6.7 cm length by 2.3 cm AP by 4.8 cm transverse.  Normal morphology without mass  Endometrium: 2 mm thick, normal.  No endometrial fluid.  Right ovary:  2.0 x 1.2 x 1.4 cm.  Normal morphology without mass.  Left ovary: 2.1 x 1.0 x 1.2 cm.  Normal morphology without mass.  Other findings: No free fluid or adnexal masses.  IMPRESSION: Normal exam.  Original Report Authenticated By: Lollie Marrow, M.D.   US Pelvis Complete  11/25/2011  *RADIOLOGY REPORT*  Clinical Data: Pain right lower quadrant question ovarian cyst; LMP 11/04/2011  TRANSABDOMINAL AND TRANSVAGINAL ULTRASOUND OF PELVIS Technique:  Both transabdominal and transvaginal ultrasound examinations of the pelvis were performed. Transabdominal technique was performed for global imaging of the pelvis including uterus, ovaries, adnexal regions, and pelvic cul-de-sac.  Comparison: 08/28/2011   It was necessary to proceed with endovaginal exam following the transabdominal exam to visualize the uterus, endometrium, and ovaries.  Findings:  Uterus: 6.7 cm length by 2.3 cm AP by 4.8 cm transverse.  Normal morphology without mass  Endometrium: 2 mm thick, normal.  No endometrial fluid.  Right ovary:  2.0 x 1.2 x 1.4 cm.  Normal morphology without mass.  Left ovary: 2.1 x 1.0 x 1.2 cm.  Normal morphology without mass.  Other findings: No free fluid or adnexal masses.  IMPRESSION: Normal exam.  Original Report Authenticated By: Lollie Marrow, M.D.   Ct Abdomen Pelvis W Contrast  11/25/2011  *RADIOLOGY REPORT*  Clinical Data:  Right lower quadrant pain  CT ABDOMEN AND PELVIS WITH CONTRAST  Technique:  Multidetector CT imaging of the abdomen and pelvis was performed following the standard protocol during  bolus administration of intravenous contrast. Sagittal  and coronal MPR images reconstructed from axial data set.  Contrast: OMNIPAQUE IOHEXOL 300 MG/ML IV SOLN Dilute oral contrast.  Comparison: 08/31/2011  Findings: Lung bases clear. Spleen appears enlarged, 15.2 x 9.1 x 12.1 cm. Liver, spleen, pancreas, kidneys, and adrenal glands otherwise normal. Thin normal-appearing retrocecal appendix, unchanged. Normal appearance of bladder, uterus, adnexae, and ovaries. Scattered normal-sized mesenteric lymph nodes. Stomach and bowel loops unremarkable. No mass, adenopathy, free fluid or inflammatory process. No hernia or acute bony lesion.  IMPRESSION: Mild splenic enlargement. Otherwise normal exam.  Original Report Authenticated By: Lollie Marrow, M.D.   Dg Abd Acute W/chest  11/28/2011  *RADIOLOGY REPORT*  Clinical Data: 26 year old female with right lower quadrant abdominal pain.  ACUTE ABDOMEN SERIES (ABDOMEN 2 VIEW & CHEST 1 VIEW)  Comparison: CT abdomen and pelvis 11/25/2011 and earlier.  Findings: Normal lung volumes. Normal cardiac size and mediastinal contours.  The lungs are clear.  No pneumothorax or pneumoperitoneum.  Nonobstructed bowel gas pattern.  Abdominal and pelvic visceral contours are within normal limits.  Mild scoliosis. No acute osseous abnormality identified.  IMPRESSION: Nonobstructed bowel gas pattern, no free air.  Negative chest.  Original Report Authenticated By: Harley Hallmark, M.D.    Abdominal x-rays and labs unremarkable. On today's visit. Has or had previous CAT scan and ultrasound. As discussed with patient the majority of her pain is coming from her right lower back which been chronic. We'll prescribe pain medications and have her see her Dr. this week.    Aaliyana Fredericks A. Patrica Duel, MD 11/28/11 1833    Lorelle Gibbs. Patrica Duel, MD 11/28/11 9604

## 2011-11-28 NOTE — ED Notes (Signed)
Pt. Transferred from [facility] for further evaluation.  UA collected at [facility]

## 2011-11-28 NOTE — ED Notes (Signed)
Patient transported to X-ray 

## 2011-11-29 NOTE — ED Provider Notes (Deleted)
History     CSN: [id number]  Arrival date & time 11/27/11  2137   First MD Initiated Contact with Patient 11/28/11 0048      Chief Complaint  Patient presents with  . Abdominal Pain  . Nausea    (Consider location/radiation/quality/duration/timing/severity/associated sxs/prior treatment) HPI Comments: Recently seen here for same, had workup that was okay.    Patient is a 26 y.o. female presenting with abdominal pain. The history is provided by the patient.  Abdominal Pain The primary symptoms of the illness include abdominal pain. The current episode started more than 2 days ago. The problem has not changed since onset. The patient states that she believes she is currently not pregnant. The patient has not had a change in bowel habit. Symptoms associated with the illness do not include chills, heartburn, urgency or hematuria.    Past Medical History  Diagnosis Date  . Herpes     History reviewed. No pertinent past surgical history.  Family History  Problem Relation Age of Onset  . Diabetes Mother   . Diabetes Other   . Hypertension Other     History  Substance Use Topics  . Smoking status: Former Smoker -- 3 years    Types: Cigarettes    Quit date: 07/28/2011  . Smokeless tobacco: Never Used  . Alcohol Use: No    OB History    Grav Para Term Preterm Abortions TAB SAB Ect Mult Living   1 1 1       1       Review of Systems  Constitutional: Negative for chills.  Gastrointestinal: Positive for abdominal pain. Negative for heartburn.  Genitourinary: Negative for urgency and hematuria.  All other systems reviewed and are negative.    Allergies  Review of patient's allergies indicates no known allergies.  Home Medications   Current Outpatient Rx  Name Route Sig Dispense Refill  . ACETAMINOPHEN 500 MG PO TABS Oral Take 500 mg by mouth every 6 (six) hours as needed. For pain    . NORGESTIMATE-ETH ESTRADIOL 0.25-35 MG-MCG PO TABS Oral Take 1 tablet by mouth  at bedtime. AT 11PM     . ONDANSETRON HCL 4 MG PO TABS Oral Take 1 tablet (4 mg total) by mouth every 6 (six) hours. 12 tablet 0  . OXYCODONE-ACETAMINOPHEN 5-325 MG PO TABS Oral Take 1 tablet by mouth every 4 (four) hours as needed for pain. 15 tablet 0  . VALACYCLOVIR HCL 1 G PO TABS Oral Take 1,000 mg by mouth at bedtime.       BP 134/91  Pulse 106  Temp(Src) 97.8 F (36.6 C) (Oral)  Ht 5\' 6"  (1.676 m)  Wt 263 lb (119.296 kg)  BMI 42.45 kg/m2  SpO2 100%  LMP 11/04/2011  Physical Exam  Nursing note and vitals reviewed. Constitutional: She is oriented to person, place, and time. She appears well-developed and well-nourished. No distress.  HENT:  Head: Normocephalic and atraumatic.  Neck: Normal range of motion. Neck supple.  Cardiovascular: Normal rate and regular rhythm.  Exam reveals no gallop and no friction rub.   No murmur heard. Pulmonary/Chest: Effort normal and breath sounds normal. No respiratory distress. She has no wheezes.  Abdominal: Soft. Bowel sounds are normal. She exhibits no distension. There is no tenderness.       Mild ttp in the right mid abdomen.  No rebound or guarding.  Musculoskeletal: Normal range of motion.  Neurological: She is alert and oriented to person, place, and time.  Skin: Skin is warm and dry. She is not diaphoretic.    ED Course  Procedures (including critical care time)  Labs Reviewed - No data to display Dg Abd Acute W/chest  11/28/2011  *RADIOLOGY REPORT*  Clinical Data: 26 year old female with right lower quadrant abdominal pain.  ACUTE ABDOMEN SERIES (ABDOMEN 2 VIEW & CHEST 1 VIEW)  Comparison: CT abdomen and pelvis 11/25/2011 and earlier.  Findings: Normal lung volumes. Normal cardiac size and mediastinal contours.  The lungs are clear.  No pneumothorax or pneumoperitoneum.  Nonobstructed bowel gas pattern.  Abdominal and pelvic visceral contours are within normal limits.  Mild scoliosis. No acute osseous abnormality identified.   IMPRESSION: Nonobstructed bowel gas pattern, no free air.  Negative chest.  Original Report Authenticated By: Harley Hallmark, M.D.     No diagnosis found.    MDM  Abd benign, recent workup that was unremarkable.  Will discharge home.        Geoffery Lyons, MD 11/29/11 2055

## 2012-01-03 ENCOUNTER — Encounter (HOSPITAL_COMMUNITY): Payer: Self-pay

## 2012-01-03 ENCOUNTER — Emergency Department (HOSPITAL_COMMUNITY): Payer: Medicaid Other

## 2012-01-03 ENCOUNTER — Emergency Department (HOSPITAL_COMMUNITY)
Admission: EM | Admit: 2012-01-03 | Discharge: 2012-01-03 | Disposition: A | Discharge status: Home / Self Care | Payer: Medicaid Other | Attending: Emergency Medicine | Admitting: Emergency Medicine

## 2012-01-03 ENCOUNTER — Other Ambulatory Visit: Payer: Self-pay

## 2012-01-03 DIAGNOSIS — I498 Other specified cardiac arrhythmias: Secondary | ICD-10-CM | POA: Insufficient documentation

## 2012-01-03 DIAGNOSIS — B009 Herpesviral infection, unspecified: Secondary | ICD-10-CM | POA: Insufficient documentation

## 2012-01-03 DIAGNOSIS — R079 Chest pain, unspecified: Secondary | ICD-10-CM | POA: Insufficient documentation

## 2012-01-03 DIAGNOSIS — Z87891 Personal history of nicotine dependence: Secondary | ICD-10-CM | POA: Insufficient documentation

## 2012-01-03 DIAGNOSIS — R0602 Shortness of breath: Secondary | ICD-10-CM | POA: Insufficient documentation

## 2012-01-03 LAB — D-DIMER, QUANTITATIVE: D-Dimer, Quant: 0.61 ug/mL-FEU — ABNORMAL HIGH (ref 0.00–0.48)

## 2012-01-03 LAB — CBC
MCH: 29.5 pg (ref 26.0–34.0)
MCHC: 34 g/dL (ref 30.0–36.0)
MCV: 86.8 fL (ref 78.0–100.0)
Platelets: 256 10*3/uL (ref 150–400)

## 2012-01-03 LAB — PRO B NATRIURETIC PEPTIDE: Pro B Natriuretic peptide (BNP): 8.7 pg/mL (ref 0–125)

## 2012-01-03 LAB — POCT PREGNANCY, URINE: Preg Test, Ur: NEGATIVE

## 2012-01-03 LAB — TROPONIN I: Troponin I: <0.3 ng/mL (ref <0.30)

## 2012-01-03 LAB — BASIC METABOLIC PANEL
Calcium: 9.7 mg/dL (ref 8.4–10.5)
Creatinine, Ser: 0.78 mg/dL (ref 0.50–1.10)
GFR calc non Af Amer: >90 mL/min (ref >90)
Glucose, Bld: 99 mg/dL (ref 70–99)
Sodium: 138 mEq/L (ref 135–145)

## 2012-01-03 MED ORDER — NAPROXEN 500 MG PO TABS: 500.0000 mg | ORAL_TABLET | Freq: Two times a day (BID) | ORAL | Status: DC

## 2012-01-03 MED ORDER — ONDANSETRON HCL 4 MG/2ML IJ SOLN
4.0000 mg | Freq: Once | INTRAMUSCULAR | Status: AC
  Administered 2012-01-03: 4 mg via INTRAVENOUS
  Filled 2012-01-03: qty 2

## 2012-01-03 MED ORDER — IOHEXOL 350 MG/ML SOLN
100.0000 mL | Freq: Once | INTRAVENOUS | Status: AC | PRN
  Administered 2012-01-03: 100 mL via INTRAVENOUS

## 2012-01-03 MED ORDER — SODIUM CHLORIDE 0.9 % IV SOLN: INTRAVENOUS | Status: DC

## 2012-01-03 MED ORDER — HYDROCODONE-ACETAMINOPHEN 5-325 MG PO TABS: 1.0000 | ORAL_TABLET | Freq: Four times a day (QID) | ORAL | Status: DC | PRN

## 2012-01-03 MED ORDER — SODIUM CHLORIDE 0.9 % IV BOLUS (SEPSIS)
250.0000 mL | Freq: Once | INTRAVENOUS | Status: AC
  Administered 2012-01-03: 250 mL via INTRAVENOUS

## 2012-01-03 MED ORDER — HYDROMORPHONE HCL PF 1 MG/ML IJ SOLN
1.0000 mg | Freq: Once | INTRAMUSCULAR | Status: AC
  Administered 2012-01-03: 1 mg via INTRAVENOUS
  Filled 2012-01-03: qty 1

## 2012-01-03 NOTE — ED Notes (Signed)
Pt reports chest pain that began yesterday - - pt denies hx of similar pain - pain is sharp and non-radiating, worse w/ inspiration. Pt in no acute distress, occasionally grimacing. Pt denies n/v/d, fever, or cough - admits to some shortness of breath. Family x3 at bedside.

## 2012-01-03 NOTE — ED Provider Notes (Addendum)
History    Scribed for Shelda Jakes, MD, the patient was seen in room APA18/AP[facility]18. This chart was scribed by Katha Cabal.   CSN: [id number]  Arrival date & time 01/03/12  [id number]   First MD Initiated Contact with Patient 01/03/12 1931      Chief Complaint  Patient presents with  . Chest Pain  . Back Pain    (Consider location/radiation/quality/duration/timing/severity/associated sxs/prior treatment) HPI [name] is a 26 y.o. female who presents to the Emergency Department complaining of constant left sided chest pain that began around 11 AM.  Pain is gradually worsening.  Pain described as sharp.  Rated 10/10.  Breathing makes pain worse.  Non exertional.  Pain does not radiate.  Symptoms are associated with shortness of breath.  Symptoms are not associated with abdominal, congestion, cough, sore thorat, neck pain, fever, dysuria, leg swelling, rash, nausea or vomiting.  Patient also complains of lower back pain.  Pain for past week.     Patient's last menstrual period was 12/13/2011.  PCP Alice Reichert, MD, MD    Past Medical History  Diagnosis Date  . Herpes     History reviewed. No pertinent past surgical history.  Family History  Problem Relation Age of Onset  . Diabetes Mother   . Diabetes Other   . Hypertension Other     History  Substance Use Topics  . Smoking status: Former Smoker -- 3 years    Types: Cigarettes    Quit date: 07/28/2011  . Smokeless tobacco: Never Used  . Alcohol Use: No    OB History    Grav Para Term Preterm Abortions TAB SAB Ect Mult Living   1 1 1       1       Review of Systems  All other systems reviewed and are negative.   Remaining review of systems negative except as noted in the HPI.   Allergies  Review of patient's allergies indicates no known allergies.  Home Medications   Current Outpatient Rx  Name Route Sig Dispense Refill  . ACETAMINOPHEN 500 MG PO TABS Oral Take 500 mg by mouth every 6 (six)  hours as needed. For pain    . NORGESTIMATE-ETH ESTRADIOL 0.25-35 MG-MCG PO TABS Oral Take 1 tablet by mouth at bedtime. AT 11PM     . VALACYCLOVIR HCL 1 G PO TABS Oral Take 1,000 mg by mouth at bedtime.       BP 109/64  Pulse 105  Temp(Src) 98.3 F (36.8 C) (Oral)  Resp 27  Ht 5\' 6"  (1.676 m)  Wt 263 lb (119.296 kg)  BMI 42.45 kg/m2  SpO2 100%  LMP 12/13/2011  Physical Exam  Nursing note and vitals reviewed. Constitutional: She is oriented to person, place, and time. She appears well-developed and well-nourished.  HENT:  Head: Normocephalic and atraumatic.  Eyes: Conjunctivae and EOM are normal.  Neck: Neck supple.  Cardiovascular: Regular rhythm and normal heart sounds.  Tachycardia present.   No murmur heard. Pulmonary/Chest: Effort normal and breath sounds normal. No respiratory distress. She has no wheezes.  Abdominal: Soft. Bowel sounds are normal. There is no tenderness. There is no rebound and no guarding.  Musculoskeletal: Normal range of motion. She exhibits no edema.  Neurological: She is alert and oriented to person, place, and time. No cranial nerve deficit or sensory deficit. Coordination normal.  Skin: Skin is warm and dry. No rash noted.  Psychiatric: She has a normal mood and affect. Her behavior is  normal.    ED Course  Procedures (including critical care time)   DIAGNOSTIC STUDIES: Oxygen Saturation is 100% on room air, normal by my interpretation.     COORDINATION OF CARE: 7:57 PM  Physical exam complete. Pain control.   8:00 PM  Dilaudid and Zofran ordered. IV fluids.     LABS / RADIOLOGY:   Labs Reviewed  CBC - Abnormal; Notable for the following:    WBC 11.5 (*)    All other components within normal limits  D-DIMER, QUANTITATIVE - Abnormal; Notable for the following:    D-Dimer, Quant 0.61 (*)    All other components within normal limits  BASIC METABOLIC PANEL  PRO B NATRIURETIC PEPTIDE   Results for orders placed during the hospital  encounter of 01/03/12  CBC      Component Value Range   WBC 11.5 (*) 4.0 - 10.5 (K/uL)   RBC 5.01  3.87 - 5.11 (MIL/uL)   Hemoglobin 14.8  12.0 - 15.0 (g/dL)   HCT 40.9  81.1 - 91.4 (%)   MCV 86.8  78.0 - 100.0 (fL)   MCH 29.5  26.0 - 34.0 (pg)   MCHC 34.0  30.0 - 36.0 (g/dL)   RDW 78.2  95.6 - 21.3 (%)   Platelets 256  150 - 400 (K/uL)  BASIC METABOLIC PANEL      Component Value Range   Sodium 138  135 - 145 (mEq/L)   Potassium 3.7  3.5 - 5.1 (mEq/L)   Chloride 102  96 - 112 (mEq/L)   CO2 23  19 - 32 (mEq/L)   Glucose, Bld 99  70 - 99 (mg/dL)   BUN 10  6 - 23 (mg/dL)   Creatinine, Ser 0.86  0.50 - 1.10 (mg/dL)   Calcium 9.7  8.4 - 57.8 (mg/dL)   GFR calc non Af Amer >90  >90 (mL/min)   GFR calc Af Amer >90  >90 (mL/min)  PRO B NATRIURETIC PEPTIDE      Component Value Range   Pro B Natriuretic peptide (BNP) 8.7  0 - 125 (pg/mL)  D-DIMER, QUANTITATIVE      Component Value Range   D-Dimer, Quant 0.61 (*) 0.00 - 0.48 (ug/mL-FEU)    Dg Chest 2 View  01/03/2012  *RADIOLOGY REPORT*  Clinical Data: Chest pain.  CHEST - 2 VIEW  Comparison: 05/23/2011.  Findings:  Cardiopericardial silhouette within normal limits. Mediastinal contours normal. Trachea midline.  No airspace disease or effusion. Monitoring leads are projected over the chest.  IMPRESSION: No active cardiopulmonary disease.  Original Report Authenticated By: Andreas Newport, M.D.     Date: 01/03/2012  Rate: 119  Rhythm: sinus tachycardia  QRS Axis: normal  Intervals: PR prolonged  ST/T Wave abnormalities: normal  Conduction Disutrbances:none  Narrative Interpretation:   Old EKG Reviewed: none available Correction PR intervals are normal no prolongation    Results for orders placed during the hospital encounter of 01/03/12  CBC      Component Value Range   WBC 11.5 (*) 4.0 - 10.5 (K/uL)   RBC 5.01  3.87 - 5.11 (MIL/uL)   Hemoglobin 14.8  12.0 - 15.0 (g/dL)   HCT 46.9  62.9 - 52.8 (%)   MCV 86.8  78.0 - 100.0  (fL)   MCH 29.5  26.0 - 34.0 (pg)   MCHC 34.0  30.0 - 36.0 (g/dL)   RDW 41.3  24.4 - 01.0 (%)   Platelets 256  150 - 400 (K/uL)  BASIC METABOLIC PANEL  Component Value Range   Sodium 138  135 - 145 (mEq/L)   Potassium 3.7  3.5 - 5.1 (mEq/L)   Chloride 102  96 - 112 (mEq/L)   CO2 23  19 - 32 (mEq/L)   Glucose, Bld 99  70 - 99 (mg/dL)   BUN 10  6 - 23 (mg/dL)   Creatinine, Ser 1.61  0.50 - 1.10 (mg/dL)   Calcium 9.7  8.4 - 09.6 (mg/dL)   GFR calc non Af Amer >90  >90 (mL/min)   GFR calc Af Amer >90  >90 (mL/min)  PRO B NATRIURETIC PEPTIDE      Component Value Range   Pro B Natriuretic peptide (BNP) 8.7  0 - 125 (pg/mL)  D-DIMER, QUANTITATIVE      Component Value Range   D-Dimer, Quant 0.61 (*) 0.00 - 0.48 (ug/mL-FEU)  TROPONIN I      Component Value Range   Troponin I <0.30  <0.30 (ng/mL)  POCT PREGNANCY, URINE      Component Value Range   Preg Test, Ur NEGATIVE  NEGATIVE    Dg Chest 2 View  01/03/2012  *RADIOLOGY REPORT*  Clinical Data: Chest pain.  CHEST - 2 VIEW  Comparison: 05/23/2011.  Findings:  Cardiopericardial silhouette within normal limits. Mediastinal contours normal. Trachea midline.  No airspace disease or effusion. Monitoring leads are projected over the chest.  IMPRESSION: No active cardiopulmonary disease.  Original Report Authenticated By: Andreas Newport, M.D.   Ct Angio Chest W/cm &/or Wo Cm  01/03/2012  *RADIOLOGY REPORT*  Clinical Data: Mid chest and back pain for 1 day.  Elevated D- dimer.  CT ANGIOGRAPHY CHEST  Technique:  Multidetector CT imaging of the chest using the standard protocol during bolus administration of intravenous contrast. Multiplanar reconstructed images including MIPs were obtained and reviewed to evaluate the vascular anatomy.  Contrast: OMNIPAQUE IOHEXOL 350 MG/ML IV SOLN  Comparison: Chest radiographs obtained earlier today and chest CTA dated 03/10/2010.  Findings: Normally opacified pulmonary arteries with no pulmonary arterial  filling defects seen.  Clear lungs.  Minimal thoracic spine degenerative changes.  The included portion of the upper abdomen is unremarkable.  IMPRESSION: No pulmonary emboli or acute abnormality.  Original Report Authenticated By: Darrol Angel, M.D.       MDM  Patient with left-sided chest pain with shortness of breath mild tachycardia concerning for PE d-dimer slightly elevated CT and you ordered to rule out PE. EKG without any acute changes chest x-ray without pneumonia or pneumothorax. ET NG results still pending. Patient's chest pain improved with hydromorphone IV in the ED.  CT chest without evidence of pulmonary embolism or any other significant abnormalities. His chest pain is most likely chest wall in nature will treat with Naprosyn and hydrocodone as needed. Today's workup without evidence of pneumonia pneumothorax acute cardiac event or pulmonary embolism. As noted above patient's troponin was negative as well.      MEDICATIONS GIVEN IN THE E.D. Scheduled Meds:    . HYDROmorphone  1 mg Intravenous Once  . ondansetron  4 mg Intravenous Once  . sodium chloride  250 mL Intravenous Once   Continuous Infusions:    . sodium chloride         IMPRESSION: 1. Chest pain      DISCHARGE MEDICATIONS: New Prescriptions   No medications on file      I personally performed the services described in this documentation, which was scribed in my presence. The recorded information has been reviewed  and considered.        Shelda Jakes, MD 01/03/12 2118  Shelda Jakes, MD 01/03/12 [phone]

## 2012-01-03 NOTE — ED Notes (Signed)
Pt presents with left sided non radiating chest pain and low back pain since today.

## 2012-01-03 NOTE — Discharge Instructions (Signed)
Chest Pain (Nonspecific) It is often hard to give a specific diagnosis for the cause of chest pain. There is always a chance that your pain could be related to something serious, such as a heart attack or a blood clot in the lungs. You need to follow up with your caregiver for further evaluation. CAUSES   Heartburn.   Pneumonia or bronchitis.   Anxiety and stress.   Inflammation around your heart (pericarditis) or lung (pleuritis or pleurisy).   A blood clot in the lung.   A collapsed lung (pneumothorax). It can develop suddenly on its own (spontaneous pneumothorax) or from injury (trauma) to the chest.  The chest wall is composed of bones, muscles, and cartilage. Any of these can be the source of the pain.  The bones can be bruised by injury.   The muscles or cartilage can be strained by coughing or overwork.   The cartilage can be affected by inflammation and become sore (costochondritis).  DIAGNOSIS  Lab tests or other studies, such as X-rays, an EKG, stress testing, or cardiac imaging, may be needed to find the cause of your pain.  TREATMENT   Treatment depends on what may be causing your chest pain. Treatment may include:   Acid blockers for heartburn.   Anti-inflammatory medicine.   Pain medicine for inflammatory conditions.   Antibiotics if an infection is present.   You may be advised to change lifestyle habits. This includes stopping smoking and avoiding caffeine and chocolate.   You may be advised to keep your head raised (elevated) when sleeping. This reduces the chance of acid going backward from your stomach into your esophagus.   Most of the time, nonspecific chest pain will improve within 2 to 3 days with rest and mild pain medicine.  HOME CARE INSTRUCTIONS   If antibiotics were prescribed, take the full amount even if you start to feel better.   For the next few days, avoid physical activities that bring on chest pain. Continue physical activities as  directed.   Do not smoke cigarettes or drink alcohol until your symptoms are gone.   Only take over-the-counter or prescription medicine for pain, discomfort, or fever as directed by your caregiver.   Follow your caregiver's suggestions for further testing if your chest pain does not go away.   Keep any follow-up appointments you made. If you do not go to an appointment, you could develop lasting (chronic) problems with pain. If there is any problem keeping an appointment, you must call to reschedule.  SEEK MEDICAL CARE IF:   You think you are having problems from the medicine you are taking. Read your medicine instructions carefully.   Your chest pain does not go away, even after treatment.   You develop a rash with blisters on your chest.  SEEK IMMEDIATE MEDICAL CARE IF:   You have increased chest pain or pain that spreads to your arm, neck, jaw, back, or belly (abdomen).   You develop shortness of breath, an increasing cough, or you are coughing up blood.   You have severe back or abdominal pain, feel sick to your stomach (nauseous) or throw up (vomit).   You develop severe weakness, fainting, or chills.   You have an oral temperature above 102 F (38.9 C), not controlled by medicine.  THIS IS AN EMERGENCY. Do not wait to see if the pain will go away. Get medical help at once. Call your local emergency services (911 in [place] Do not drive yourself to   the hospital. MAKE SURE YOU:   Understand these instructions.   Will watch your condition.   Will get help right away if you are not doing well or get worse.  Document Released: 08/12/2005 Document Revised: 07/15/2011 Document Reviewed: 06/07/2008 [facility] Patient Information 2012 [place], [place].Chest Pain (Nonspecific) It is often hard to give a specific diagnosis for the cause of chest pain. There is always a chance that your pain could be related to something serious, such as a heart attack or a blood clot in the lungs. You  need to follow up with your caregiver for further evaluation. CAUSES   Heartburn.   Pneumonia or bronchitis.   Anxiety and stress.   Inflammation around your heart (pericarditis) or lung (pleuritis or pleurisy).   A blood clot in the lung.   A collapsed lung (pneumothorax). It can develop suddenly on its own (spontaneous pneumothorax) or from injury (trauma) to the chest.  The chest wall is composed of bones, muscles, and cartilage. Any of these can be the source of the pain.  The bones can be bruised by injury.   The muscles or cartilage can be strained by coughing or overwork.   The cartilage can be affected by inflammation and become sore (costochondritis).  DIAGNOSIS  Lab tests or other studies, such as X-rays, an EKG, stress testing, or cardiac imaging, may be needed to find the cause of your pain.  TREATMENT   Treatment depends on what may be causing your chest pain. Treatment may include:   Acid blockers for heartburn.   Anti-inflammatory medicine.   Pain medicine for inflammatory conditions.   Antibiotics if an infection is present.   You may be advised to change lifestyle habits. This includes stopping smoking and avoiding caffeine and chocolate.   You may be advised to keep your head raised (elevated) when sleeping. This reduces the chance of acid going backward from your stomach into your esophagus.   Most of the time, nonspecific chest pain will improve within 2 to 3 days with rest and mild pain medicine.  HOME CARE INSTRUCTIONS   If antibiotics were prescribed, take the full amount even if you start to feel better.   For the next few days, avoid physical activities that bring on chest pain. Continue physical activities as directed.   Do not smoke cigarettes or drink alcohol until your symptoms are gone.   Only take over-the-counter or prescription medicine for pain, discomfort, or fever as directed by your caregiver.   Follow your caregiver's suggestions  for further testing if your chest pain does not go away.   Keep any follow-up appointments you made. If you do not go to an appointment, you could develop lasting (chronic) problems with pain. If there is any problem keeping an appointment, you must call to reschedule.  SEEK MEDICAL CARE IF:   You think you are having problems from the medicine you are taking. Read your medicine instructions carefully.   Your chest pain does not go away, even after treatment.   You develop a rash with blisters on your chest.  SEEK IMMEDIATE MEDICAL CARE IF:   You have increased chest pain or pain that spreads to your arm, neck, jaw, back, or belly (abdomen).   You develop shortness of breath, an increasing cough, or you are coughing up blood.   You have severe back or abdominal pain, feel sick to your stomach (nauseous) or throw up (vomit).   You develop severe weakness, fainting, or chills.  You have an oral temperature above 102 F (38.9 C), not controlled by medicine.  THIS IS AN EMERGENCY. Do not wait to see if the pain will go away. Get medical help at once. Call your local emergency services (911 in [place] Do not drive yourself to the hospital. MAKE SURE YOU:   Understand these instructions.   Will watch your condition.   Will get help right away if you are not doing well or get worse.  Document Released: 08/12/2005 Document Revised: 07/15/2011 Document Reviewed: 06/07/2008   Workup in the emergency department negative for blood clot in the lungs pneumonia or collapsed lung no evidence of any cardiac injury. Chest pain most likely chest wall in nature will treat with anti-inflammatory medicines and pain medicine. Take these medicines as directed. Return to the emergency partner for new or worse symptoms. Followup with your primary care Dr. Next 2 days if not better. [employer] Patient Information 2012 [place], [place].

## 2012-01-03 NOTE — ED Notes (Signed)
Rx given x2 D/c instructions reviewed w/ pt and family - pt and family deny any further questions or concerns at present.  

## 2012-01-09 ENCOUNTER — Other Ambulatory Visit: Payer: Self-pay

## 2012-01-09 ENCOUNTER — Emergency Department (HOSPITAL_COMMUNITY): Payer: Medicaid Other

## 2012-01-09 ENCOUNTER — Emergency Department (HOSPITAL_COMMUNITY)
Admission: EM | Admit: 2012-01-09 | Discharge: 2012-01-09 | Disposition: A | Discharge status: Home Health | Payer: Medicaid Other | Attending: Emergency Medicine | Admitting: Emergency Medicine

## 2012-01-09 ENCOUNTER — Encounter (HOSPITAL_COMMUNITY): Payer: Self-pay

## 2012-01-09 DIAGNOSIS — R0789 Other chest pain: Secondary | ICD-10-CM

## 2012-01-09 DIAGNOSIS — R079 Chest pain, unspecified: Secondary | ICD-10-CM | POA: Insufficient documentation

## 2012-01-09 DIAGNOSIS — R071 Chest pain on breathing: Secondary | ICD-10-CM | POA: Insufficient documentation

## 2012-01-09 DIAGNOSIS — R112 Nausea with vomiting, unspecified: Secondary | ICD-10-CM | POA: Insufficient documentation

## 2012-01-09 DIAGNOSIS — R109 Unspecified abdominal pain: Secondary | ICD-10-CM | POA: Insufficient documentation

## 2012-01-09 LAB — CBC
HCT: 41.5 % (ref 36.0–46.0)
Hemoglobin: 13.9 g/dL (ref 12.0–15.0)
RBC: 4.83 MIL/uL (ref 3.87–5.11)

## 2012-01-09 LAB — DIFFERENTIAL
Lymphs Abs: 1.9 10*3/uL (ref 0.7–4.0)
Monocytes Absolute: 0.6 10*3/uL (ref 0.1–1.0)
Monocytes Relative: 8 % (ref 3–12)
Neutro Abs: 4.5 10*3/uL (ref 1.7–7.7)
Neutrophils Relative %: 63 % (ref 43–77)

## 2012-01-09 LAB — COMPREHENSIVE METABOLIC PANEL
ALT: 17 U/L (ref 0–35)
Alkaline Phosphatase: 113 U/L (ref 39–117)
BUN: 12 mg/dL (ref 6–23)
CO2: 27 mEq/L (ref 19–32)
Chloride: 104 mEq/L (ref 96–112)
GFR calc Af Amer: >90 mL/min (ref >90)
GFR calc non Af Amer: >90 mL/min (ref >90)
Glucose, Bld: 84 mg/dL (ref 70–99)
Potassium: 3.9 mEq/L (ref 3.5–5.1)
Sodium: 138 mEq/L (ref 135–145)
Total Bilirubin: 0.3 mg/dL (ref 0.3–1.2)

## 2012-01-09 LAB — URINALYSIS, ROUTINE W REFLEX MICROSCOPIC
Glucose, UA: NEGATIVE mg/dL
Hgb urine dipstick: NEGATIVE
Ketones, ur: NEGATIVE mg/dL
Leukocytes, UA: NEGATIVE
Protein, ur: NEGATIVE mg/dL
pH: 7 (ref 5.0–8.0)

## 2012-01-09 LAB — LIPASE, BLOOD: Lipase: 39 U/L (ref 11–59)

## 2012-01-09 MED ORDER — KETOROLAC TROMETHAMINE 60 MG/2ML IM SOLN
60.0000 mg | Freq: Once | INTRAMUSCULAR | Status: AC
  Administered 2012-01-09: 60 mg via INTRAMUSCULAR
  Filled 2012-01-09: qty 2

## 2012-01-09 MED ORDER — ONDANSETRON 8 MG PO TBDP
8.0000 mg | ORAL_TABLET | Freq: Once | ORAL | Status: AC
  Administered 2012-01-09: 8 mg via ORAL
  Filled 2012-01-09: qty 1

## 2012-01-09 MED ORDER — GI COCKTAIL ~~LOC~~
30.0000 mL | Freq: Once | ORAL | Status: AC
  Administered 2012-01-09: 30 mL via ORAL
  Filled 2012-01-09: qty 30

## 2012-01-09 MED ORDER — FAMOTIDINE 20 MG PO TABS
40.0000 mg | ORAL_TABLET | Freq: Once | ORAL | Status: AC
  Administered 2012-01-09: 40 mg via ORAL
  Filled 2012-01-09: qty 1

## 2012-01-09 MED ORDER — PROMETHAZINE HCL 25 MG PO TABS: 25.0000 mg | ORAL_TABLET | Freq: Four times a day (QID) | ORAL | Status: DC | PRN

## 2012-01-09 NOTE — ED Notes (Signed)
Pt presents with vomiting and chest pain. Pt states chest pain started 1 week ago and vomiting started last night.

## 2012-01-09 NOTE — ED Provider Notes (Signed)
History     CSN: [id number]  Arrival date & time 01/09/12  1310   First MD Initiated Contact with Patient 01/09/12 1422      Chief Complaint  Patient presents with  . Emesis  . Chest Pain     HPI Pt was seen at 1425.  Per pt, c/o gradual onset and persistence of constant anterior chest wall "pain" x1 week.  Has been assoc with several episodes of N/V since last night.  Pt has been eval in ED multiple times for same over the past 2 years, including as recently as 1 week ago with extensive testing; including multiple CT scans of chest, abd/pelvis, and [place] of abd and pelvis.  Pt's mother questions if pt "has panic attacks or reflux like I do."  Pt has not been eval by GI MD.  Denies any change in her usual chronic symptoms today, no diarrhea, no rash, no fevers, no palpitations, no SOB/cough, no back pain.      Past Medical History  Diagnosis Date  . Herpes     History reviewed. No pertinent past surgical history.  Family History  Problem Relation Age of Onset  . Diabetes Mother   . Diabetes Other   . Hypertension Other     History  Substance Use Topics  . Smoking status: Former Smoker -- 3 years    Types: Cigarettes    Quit date: 07/28/2011  . Smokeless tobacco: Never Used  . Alcohol Use: No    OB History    Grav Para Term Preterm Abortions TAB SAB Ect Mult Living   1 1 1       1       Review of Systems ROS: Statement: All systems negative except as marked or noted in the HPI; Constitutional: Negative for fever and chills. ; ; Eyes: Negative for eye pain, redness and discharge. ; ; ENMT: Negative for ear pain, hoarseness, nasal congestion, sinus pressure and sore throat. ; ; Cardiovascular: +CP. Negative for palpitations, diaphoresis, dyspnea and peripheral edema. ; ; Respiratory: Negative for cough, wheezing and stridor. ; ; Gastrointestinal: +N/V. Negative for diarrhea, abdominal pain, blood in stool, hematemesis, jaundice and rectal bleeding. . ; ; Genitourinary:  Negative for dysuria, flank pain and hematuria. ; ; Musculoskeletal: Negative for back pain and neck pain. Negative for swelling and trauma.; ; Skin: Negative for pruritus, rash, abrasions, blisters, bruising and skin lesion.; ; Neuro: Negative for headache, lightheadedness and neck stiffness. Negative for weakness, altered level of consciousness , altered mental status, extremity weakness, paresthesias, involuntary movement, seizure and syncope.     Allergies  Review of patient's allergies indicates no known allergies.  Home Medications   Current Outpatient Rx  Name Route Sig Dispense Refill  . ACETAMINOPHEN 500 MG PO TABS Oral Take 500 mg by mouth every 6 (six) hours as needed. For pain    . HYDROCODONE-ACETAMINOPHEN 5-325 MG PO TABS Oral Take 1-2 tablets by mouth every 6 (six) hours as needed for pain. 10 tablet 0  . NAPROXEN 500 MG PO TABS Oral Take 1 tablet (500 mg total) by mouth 2 (two) times daily. 14 tablet 0  . NORGESTIMATE-ETH ESTRADIOL 0.25-35 MG-MCG PO TABS Oral Take 1 tablet by mouth at bedtime. AT 11PM     . VALACYCLOVIR HCL 1 G PO TABS Oral Take 1,000 mg by mouth at bedtime.       BP 135/67  Pulse 112  Temp(Src) 98.7 F (37.1 C) (Oral)  Resp 22  Ht 5'  6" (1.676 m)  Wt 263 lb (119.296 kg)  BMI 42.45 kg/m2  SpO2 97%  LMP 12/13/2011  Physical Exam 1430: Physical examination:  Nursing notes reviewed; Vital signs and O2 SAT reviewed;  Constitutional: Well developed, Well nourished, Well hydrated, In no acute distress; Head:  Normocephalic, atraumatic; Eyes: EOMI, PERRL, No scleral icterus; ENMT: Mouth and pharynx normal, Mucous membranes moist; Neck: Supple, Full range of motion, No lymphadenopathy; Cardiovascular: Regular rate and rhythm, No murmur, rub, or gallop; Respiratory: Breath sounds clear & equal bilaterally, No rales, rhonchi, wheezes, or rub, Normal respiratory effort/excursion; Chest: +bilat parasternal and anterior chest wall tender to palp, Movement normal;  Abdomen: Soft, +mid-epigastric area tender to palp, no rebound or guarding, Nondistended, Normal bowel sounds; Extremities: Pulses normal, No tenderness, No edema, No calf edema or asymmetry.; Neuro: AA&Ox3, Major CN grossly intact.  No gross focal motor or sensory deficits in extremities.; Skin: Color normal, Warm, Dry, no rash.    ED Course  Procedures   MDM  MDM Reviewed: nursing note, vitals and previous chart Reviewed previous: labs, ECG, CT scan, ultrasound and x-ray Interpretation: labs, ECG and x-ray    Date: 01/09/2012  Rate: 96  Rhythm: normal sinus rhythm  QRS Axis: normal  Intervals: normal  ST/T Wave abnormalities: normal  Conduction Disutrbances:none  Narrative Interpretation:  Inferior Q-waves  Old EKG Reviewed: unchanged; no significant changes from previous EKG dated 01/03/2012.  Results for orders placed during the hospital encounter of 01/09/12  URINALYSIS, ROUTINE W REFLEX MICROSCOPIC      Component Value Range   Color, Urine YELLOW  YELLOW    APPearance CLEAR  CLEAR    Specific Gravity, Urine 1.020  1.005 - 1.030    pH 7.0  5.0 - 8.0    Glucose, UA NEGATIVE  NEGATIVE (mg/dL)   Hgb urine dipstick NEGATIVE  NEGATIVE    Bilirubin Urine NEGATIVE  NEGATIVE    Ketones, ur NEGATIVE  NEGATIVE (mg/dL)   Protein, ur NEGATIVE  NEGATIVE (mg/dL)   Urobilinogen, UA 1.0  0.0 - 1.0 (mg/dL)   Nitrite NEGATIVE  NEGATIVE    Leukocytes, UA NEGATIVE  NEGATIVE   LIPASE, BLOOD      Component Value Range   Lipase 39  11 - 59 (U/L)  CBC      Component Value Range   WBC 7.2  4.0 - 10.5 (K/uL)   RBC 4.83  3.87 - 5.11 (MIL/uL)   Hemoglobin 13.9  12.0 - 15.0 (g/dL)   HCT 16.1  09.6 - 04.5 (%)   MCV 85.9  78.0 - 100.0 (fL)   MCH 28.8  26.0 - 34.0 (pg)   MCHC 33.5  30.0 - 36.0 (g/dL)   RDW 40.9  81.1 - 91.4 (%)   Platelets 208  150 - 400 (K/uL)  DIFFERENTIAL      Component Value Range   Neutrophils Relative 63  43 - 77 (%)   Neutro Abs 4.5  1.7 - 7.7 (K/uL)   Lymphocytes  Relative 26  12 - 46 (%)   Lymphs Abs 1.9  0.7 - 4.0 (K/uL)   Monocytes Relative 8  3 - 12 (%)   Monocytes Absolute 0.6  0.1 - 1.0 (K/uL)   Eosinophils Relative 2  0 - 5 (%)   Eosinophils Absolute 0.2  0.0 - 0.7 (K/uL)   Basophils Relative 1  0 - 1 (%)   Basophils Absolute 0.0  0.0 - 0.1 (K/uL)  TROPONIN I      Component Value  Range   Troponin I <0.30  <0.30 (ng/mL)  COMPREHENSIVE METABOLIC PANEL      Component Value Range   Sodium 138  135 - 145 (mEq/L)   Potassium 3.9  3.5 - 5.1 (mEq/L)   Chloride 104  96 - 112 (mEq/L)   CO2 27  19 - 32 (mEq/L)   Glucose, Bld 84  70 - 99 (mg/dL)   BUN 12  6 - 23 (mg/dL)   Creatinine, Ser 4.09  0.50 - 1.10 (mg/dL)   Calcium 9.6  8.4 - 81.1 (mg/dL)   Total Protein 7.1  6.0 - 8.3 (g/dL)   Albumin 3.5  3.5 - 5.2 (g/dL)   AST 34  0 - 37 (U/L)   ALT 17  0 - 35 (U/L)   Alkaline Phosphatase 113  39 - 117 (U/L)   Total Bilirubin 0.3  0.3 - 1.2 (mg/dL)   GFR calc non Af Amer >90  >90 (mL/min)   GFR calc Af Amer >90  >90 (mL/min)  POCT PREGNANCY, URINE      Component Value Range   Preg Test, Ur NEGATIVE  NEGATIVE     Dg Abd Acute W/chest 01/09/2012  *RADIOLOGY REPORT*  Clinical Data: 26 year old female with abdominal pain.  ACUTE ABDOMEN SERIES (ABDOMEN 2 VIEW & CHEST 1 VIEW)  Comparison: 11/27/1998 4:13  Findings: The cardiomediastinal silhouette is unremarkable. The lungs are clear. There is no evidence of airspace disease, pleural effusion or pneumothorax.  The bowel gas pattern is unremarkable. There is no evidence of bowel obstruction or pneumoperitoneum. No suspicious calcifications are identified. No acute bony abnormalities are noted.  IMPRESSION: No acute or significant abnormalities identified.  Original Report Authenticated By: Rosendo Gros, M.D.    4:44 PM:  Has tol PO well while in ED without N/V.  Feels improved after meds and wants to go home now.  Long d/w pt and family regarding need for GI MD follow up, given several year hx of same  complaint.  Dx testing d/w pt and family.  Questions answered.  Verb understanding, agreeable to d/c home with outpt f/u.              Laray Anger, DO 01/11/12 1317

## 2012-01-09 NOTE — Discharge Instructions (Signed)
RESOURCE GUIDE  Dental Problems  Patients with Medicaid: [facility]                     [facility] [address]                                           [address] Phone:  [phone]                                                  Phone:  [phone]  If unable to pay or uninsured, contact:  [facility] or [facility] to become qualified for the adult dental clinic.  Chronic Pain Problems Contact [facility]  [phone] Patients need to be referred by their primary care doctor.  Insufficient Money for Medicine Contact [employer]:  call "211" or [facility] [phone].  No Primary Care Doctor Call [facility]  [phone] Other agencies that provide inexpensive medical care    [facility]  [phone]    [facility]  [phone]    [facility]  [phone]    Women's Clinic  [phone]    [facility]  [phone]    [facility]  [phone]  Psychological Services [facility]  [phone] [facility]  [phone] [facility]   [phone] (emergency services [phone])  Substance Abuse Resources [facility]  336-882-2125 [facility] 336-784-9470 [facility] 336-285-9073 [facility] 336-845-3988 Residential & Outpatient Substance Abuse Program  800-659-3381  Abuse/Neglect [place] Child Abuse Hotline (336) 641-3795 [place] Child Abuse Hotline 800-378-5315 (After Hours)  Emergency Shelter [facility] (336) 271-5985  M[facility] Homes [facility] (336) 275-9566 [facility] (704) [phone]  MRSA Hotline #:   832-7006    [place] [facility]  [facility] [place]     [employer]                          [facility] [address] [place]                       [address]      [address]  [place]                                                [place]                            [place] Phone:  [phone]                                   Phone:  [phone]                 Phone:  [phone]  [facility] Phone:  [phone]    [facility] Child Abuse Hotline (970) 770-5954 401-459-0789 (After Hours)   Eat a bland diet, avoiding greasy, fatty, fried foods, as well as spicy and acidic foods or beverages.  Avoid eating within the hour or 2 before going to bed or laying down.  Also avoid teas, colas, coffee, chocolate, pepermint and spearment.  Take over the counter pepcid, one tablet by mouth twice a day, for the next 2 to 3 weeks.  May also take over the counter maalox/mylanta, as directed on packaging, as needed for discomfort.  Take the prescription as directed.  Call your regular medical doctor and the GI doctor on Monday to schedule a follow up appointment this week.  Return to the Emergency Department immediately if worsening.

## 2012-01-11 ENCOUNTER — Ambulatory Visit (INDEPENDENT_AMBULATORY_CARE_PROVIDER_SITE_OTHER): Payer: Medicaid Other | Admitting: Urgent Care

## 2012-01-11 ENCOUNTER — Encounter: Payer: Self-pay | Admitting: Urgent Care

## 2012-01-11 DIAGNOSIS — K219 Gastro-esophageal reflux disease without esophagitis: Secondary | ICD-10-CM

## 2012-01-11 DIAGNOSIS — R112 Nausea with vomiting, unspecified: Secondary | ICD-10-CM | POA: Insufficient documentation

## 2012-01-11 MED ORDER — ONDANSETRON HCL 4 MG PO TABS: 4.0000 mg | ORAL_TABLET | Freq: Three times a day (TID) | ORAL | Status: DC | PRN

## 2012-01-11 MED ORDER — OMEPRAZOLE 20 MG PO CPDR: DELAYED_RELEASE_CAPSULE | ORAL | Status: DC

## 2012-01-11 MED ORDER — OMEPRAZOLE 20 MG PO CPDR: 20.0000 mg | DELAYED_RELEASE_CAPSULE | Freq: Every day | ORAL | Status: DC

## 2012-01-11 NOTE — Progress Notes (Signed)
Faxed to PCP

## 2012-01-11 NOTE — Assessment & Plan Note (Signed)
See GERD 

## 2012-01-11 NOTE — Assessment & Plan Note (Addendum)
[name] is a pleasant 26 y.o. female with a two-month onset of severe chest pain, GERD, intractable nausea and vomiting.  She has been on twice a day naproxen, but only over the last week.  Differentials include erosive esophagitis, gastritis, H. pylori, chronic GERD or peptic ulcer disease. Idiopathic Gastroparesis remains in the differential as well.    Stop naproxen Begin Prilosec 20 mg 30 minutes before breakfast  Use of Zofran 4 mg every 6-8 hours as needed for nausea Call if you are unable to keep down clear liquids or go to ER EGD with Dr. Darrick Penna as soon as possible.  I have discussed risks & benefits which include, but are not limited to, bleeding, infection, perforation & drug reaction.  The patient agrees with this plan & written consent will be obtained.   Nausea and Vomiting literature GERD/PUD diet

## 2012-01-11 NOTE — Progress Notes (Signed)
Primary Care Physician:  Alice Reichert, MD, MD Primary Gastroenterologist:  Dr. Darrick Penna  Chief Complaint  Patient presents with  . Abdominal Pain  . Emesis    HPI:  [name] is a 26 y.o. female here as a new patient for evaluation of chronic nausea, vomiting, and GERD symptoms.  She denies any previous history of GI complaints up until 2 months ago. She tells me she developed pain that started in her right lower quadrant. She was seen and evaluated by Dr.Eure. She was diagnosed with "muscle spasms". She then began to have chest pain and indigestion. She complains of daily heartburn. She's had multiple visits to the emergency room with workup including lab work, acute abdominal series, and CT scan without IV contrast of the abdomen and pelvis as below. Recently she has been unable to keep anything down. She is vomiting 4-5 times per day. She is taking Phenergan every 6 hours as needed at home with minimal relief. She tells me her bowel movements have been normal, denies any rectal bleeding or melena. She denies any constipation or diarrhea. Her weight is steadily increasing, although her appetite has been quite poor over the last several months. She has been on naproxen twice a day since February 18. She denies any over-the-counter NSAIDs. She denies any previous history of acid reflux or interval bowel syndrome. She denies any ill contacts. She has had a recent urinary pregnancy test which was negative. Her LMP was 12/13/2011 and she is on her birth control.  She did have a positive d-dimer with a CT angiogram of the chest to rule out pulmonary embolus which was negative. She has had some numbness and tingling down her left arm.  01/09/12 urinalysis and urine pregnancy negative, lipase normal, TIS negative, CMP negative, and d-dimer positive at 0.61. Lab Summary Latest Ref Rng 01/09/2012 01/03/2012 11/28/2011  Hemoglobin 12.0 - 15.0 g/dL 16.1 09.6 04.5  Hematocrit 36.0 - 46.0 % 41.5 43.5 41.4    White count 4.0 - 10.5 K/uL 7.2 11.5 (H) 10.1  Platelet count 150 - 400 K/uL 208 256 250  Sodium 135 - 145 mEq/L 138 138 138  Potassium 3.5 - 5.1 mEq/L 3.9 3.7 4.1  Calcium 8.4 - 10.5 mg/dL 9.6 9.7 9.6  Phosphorus  (None) (None) (None)  Creatinine 0.50 - 1.10 mg/dL 4.09 8.11 9.14  AST 0 - 37 U/L 34 (None) 27  Alk Phos 39 - 117 U/L 113 (None) 90  Bilirubin 0.3 - 1.2 mg/dL 0.3 (None) 0.4  Glucose 70 - 99 mg/dL 84 99 79  Cholesterol  (None) (None) (None)  HDL cholesterol  (None) (None) (None)  Triglycerides  (None) (None) (None)  LDL Direct  (None) (None) (None)  LDL Calc  (None) (None) (None)  Total protein 6.0 - 8.3 g/dL 7.1 (None) 7.4  Albumin 3.5 - 5.2 g/dL 3.5 (None) 3.3 (L)  7/82/95 ACUTE ABDOMEN SERIES:nonobstructed bowel gas pattern, no free air.  Negative chest.  11/25/2011 CT ABDOMEN AND PELVIS WITH ORAL/IV CONTRAST:   Spleen appears enlarged, 15.2 x 9.1 x 12.1 cm. Scattered normal-sized mesenteric lymph nodes. Otherwise normal.  01/03/12 CT ANGIOGRAPHY CHEST: No pulmonary emboli or acute abnormality.  2/23/13ACUTE ABDOMEN SERIES:No acute or significant abnormalities identified.   Past Medical History  Diagnosis Date  . Herpes     No past surgical history on file.  Current Outpatient Prescriptions  Medication Sig Dispense Refill  . acetaminophen (TYLENOL) 500 MG tablet Take 500 mg by mouth every 6 (six) hours as  needed. For pain      . naproxen (NAPROSYN) 500 MG tablet Take 1 tablet (500 mg total) by mouth 2 (two) times daily.  14 tablet  0  . norgestimate-ethinyl estradiol (SPRINTEC 28) 0.25-35 MG-MCG tablet Take 1 tablet by mouth at bedtime. AT 11PM       . promethazine (PHENERGAN) 25 MG tablet Take 1 tablet (25 mg total) by mouth every 6 (six) hours as needed for nausea.  8 tablet  0  . valACYclovir (VALTREX) 1000 MG tablet Take 1,000 mg by mouth at bedtime.       Marland Kitchen omeprazole (PRILOSEC) 20 MG capsule 1 po 30 minutes before breakfast daily  31 capsule  2  .  ondansetron (ZOFRAN) 4 MG tablet Take 1 tablet (4 mg total) by mouth every 8 (eight) hours as needed for nausea.  30 tablet  0    Allergies as of 01/11/2012  . (No Known Allergies)    Family History  Problem Relation Age of Onset  . Diabetes Mother   . Diabetes Other   . Hypertension Other     History   Social History  . Marital Status: Single    Spouse Name: N/A    Number of Children: 1  . Years of Education: N/A   Occupational History  .    Marland [employer] Unemployed    Social History Main Topics  . Smoking status: Former Smoker -- 3 years    Types: Cigarettes    Quit date: 07/28/2011  . Smokeless tobacco: Never Used  . Alcohol Use: No  . Drug Use: No  . Sexually Active: Yes    Birth Control/ Protection: Pill   Other Topics Concern  . Not on file   Social History Narrative  . No narrative on file    Review of Systems: Gen: Denies any fever, chills, sweats, anorexia, fatigue, weakness, malaise, weight loss, and sleep disorder CV: Denies chest pain, angina, palpitations, syncope, orthopnea, PND, peripheral edema, and claudication. Resp: Denies dyspnea at rest, dyspnea with exercise, cough, sputum, wheezing, coughing up blood, and pleurisy. GI: Denies vomiting blood, jaundice, and fecal incontinence.   Denies dysphagia or odynophagia. GU : Denies urinary burning, blood in urine, urinary frequency, urinary hesitancy, nocturnal urination, and urinary incontinence. MS: Denies joint pain, limitation of movement, and swelling, stiffness, low back pain, extremity pain. Denies muscle weakness, cramps, atrophy.  Derm: Denies rash, itching, dry skin, hives, moles, warts, or unhealing ulcers.  Psych: Denies depression, anxiety, memory loss, suicidal ideation, hallucinations, paranoia, and confusion. Heme: Denies bruising, bleeding, and enlarged lymph nodes. Neuro:  Denies any headaches, dizziness, paresthesias. Endo:  Denies any problems with DM, thyroid, adrenal function.  Physical  Exam: BP 128/73  Pulse 90  Temp(Src) 98 F (36.7 C) (Temporal)  Ht 5\' 6"  (1.676 m)  Wt 310 lb (140.615 kg)  BMI 50.04 kg/m2  LMP 12/13/2011 General:   Alert,  Well-developed, obese, pleasant and cooperative in NAD.  She is accompanied by her mother, son, and fianc today. Head:  Normocephalic and atraumatic. Eyes:  Sclera clear, no icterus.   Conjunctiva pink. Ears:  Normal auditory acuity. Nose:  No deformity, discharge, or lesions. Mouth:  No deformity or lesions,oropharynx pink & moist. Neck:  Supple; no masses or thyromegaly. Lungs:  Clear throughout to auscultation.   No wheezes, crackles, or rhonchi. No acute distress. Heart:  Regular rate and rhythm; no murmurs, clicks, rubs,  or gallops. Abdomen:  Normal bowel sounds.  No bruits.  Soft, non-tender and  non-distended without masses, hepatosplenomegaly or hernias noted.  No guarding or rebound tenderness.   Rectal:  Deferred. Msk:  Symmetrical without gross deformities. Normal posture. Pulses:  Normal pulses noted. Extremities:  No clubbing or edema. Neurologic:  Alert and  oriented x4;  grossly normal neurologically. Skin:  Intact without significant lesions or rashes. Lymph Nodes:  No significant cervical adenopathy. Psych:  Alert and cooperative. Normal mood and affect.

## 2012-01-11 NOTE — Patient Instructions (Signed)
Stop naproxen Begin Prilosec 20 mg 30 minutes before breakfast Use of Zofran 4 mg every 6-8 hours as needed for nausea Call if you are unable to keep down clear liquids or to ER You will need an EGD with Dr. Darrick Penna  Nausea and Vomiting Nausea is a sick feeling that often comes before throwing up (vomiting). Vomiting is a reflex where stomach contents come out of your mouth. Vomiting can cause severe loss of body fluids (dehydration). Children and elderly adults can become dehydrated quickly, especially if they also have diarrhea. Nausea and vomiting are symptoms of a condition or disease. It is important to find the cause of your symptoms. CAUSES   Direct irritation of the stomach lining. This irritation can result from increased acid production (gastroesophageal reflux disease), infection, food poisoning, taking certain medicines (such as nonsteroidal anti-inflammatory drugs), alcohol use, or tobacco use.   Signals from the brain.These signals could be caused by a headache, heat exposure, an inner ear disturbance, increased pressure in the brain from injury, infection, a tumor, or a concussion, pain, emotional stimulus, or metabolic problems.   An obstruction in the gastrointestinal tract (bowel obstruction).   Illnesses such as diabetes, hepatitis, gallbladder problems, appendicitis, kidney problems, cancer, sepsis, atypical symptoms of a heart attack, or eating disorders.   Medical treatments such as chemotherapy and radiation.   Receiving medicine that makes you sleep (general anesthetic) during surgery.  DIAGNOSIS Your caregiver may ask for tests to be done if the problems do not improve after a few days. Tests may also be done if symptoms are severe or if the reason for the nausea and vomiting is not clear. Tests may include:  Urine tests.   Blood tests.   Stool tests.   Cultures (to look for evidence of infection).   X-rays or other imaging studies.  Test results can help  your caregiver make decisions about treatment or the need for additional tests. TREATMENT You need to stay well hydrated. Drink frequently but in small amounts.You may wish to drink water, sports drinks, clear broth, or eat frozen ice pops or gelatin dessert to help stay hydrated.When you eat, eating slowly may help prevent nausea.There are also some antinausea medicines that may help prevent nausea. HOME CARE INSTRUCTIONS   Take all medicine as directed by your caregiver.   If you do not have an appetite, do not force yourself to eat. However, you must continue to drink fluids.   If you have an appetite, eat a normal diet unless your caregiver tells you differently.   Eat a variety of complex carbohydrates (rice, wheat, potatoes, bread), lean meats, yogurt, fruits, and vegetables.   Avoid high-fat foods because they are more difficult to digest.   Drink enough water and fluids to keep your urine clear or pale yellow.   If you are dehydrated, ask your caregiver for specific rehydration instructions. Signs of dehydration may include:   Severe thirst.   Dry lips and mouth.   Dizziness.   Dark urine.   Decreasing urine frequency and amount.   Confusion.   Rapid breathing or pulse.  SEEK IMMEDIATE MEDICAL CARE IF:   You have blood or brown flecks (like coffee grounds) in your vomit.   You have black or bloody stools.   You have a severe headache or stiff neck.   You are confused.   You have severe abdominal pain.   You have chest pain or trouble breathing.   You do not urinate at least once  every 8 hours.   You develop cold or clammy skin.   You continue to vomit for longer than 24 to 48 hours.   You have a fever.  MAKE SURE YOU:   Understand these instructions.   Will watch your condition.   Will get help right away if you are not doing well or get worse.  Document Released: 11/02/2005 Document Revised: 07/15/2011 Document Reviewed: 04/01/2011 [facility]  Patient Information 2012 [place], [place].   Diet for GERD or PUD Nutrition therapy can help ease the discomfort of gastroesophageal reflux disease (GERD) and peptic ulcer disease (PUD).  HOME CARE INSTRUCTIONS   Eat your meals slowly, in a relaxed setting.   Eat 5 to 6 small meals per day.   If a food causes distress, stop eating it for a period of time.  FOODS TO AVOID  Coffee, regular or decaffeinated.   Cola beverages, regular or low calorie.   Tea, regular or decaffeinated.   Pepper.   Cocoa.   High fat foods, including meats.   Butter, margarine, hydrogenated oil (trans fats).   Peppermint or spearmint (if you have GERD).   Fruits and vegetables if not tolerated.   Alcohol.   Nicotine (smoking or chewing). This is one of the most potent stimulants to acid production in the gastrointestinal tract.   Any food that seems to aggravate your condition.  If you have questions regarding your diet, ask your caregiver or a registered dietitian. TIPS  Lying flat may make symptoms worse. Keep the head of your bed raised 6 to 9 inches (15 to 23 cm) by using a foam wedge or blocks under the legs of the bed.   Do not lay down until 3 hours after eating a meal.   Daily physical activity may help reduce symptoms.  MAKE SURE YOU:   Understand these instructions.   Will watch your condition.   Will get help right away if you are not doing well or get worse.  Document Released: 11/02/2005 Document Revised: 07/15/2011 Document Reviewed: 03/18/2009 [facility] Patient Information 2012 [place], [place].

## 2012-01-13 ENCOUNTER — Other Ambulatory Visit: Payer: Self-pay | Admitting: Gastroenterology

## 2012-01-13 ENCOUNTER — Encounter: Payer: Self-pay | Admitting: Gastroenterology

## 2012-01-13 DIAGNOSIS — R112 Nausea with vomiting, unspecified: Secondary | ICD-10-CM

## 2012-01-13 DIAGNOSIS — K219 Gastro-esophageal reflux disease without esophagitis: Secondary | ICD-10-CM

## 2012-01-18 ENCOUNTER — Telehealth: Payer: Self-pay | Admitting: Gastroenterology

## 2012-01-18 MED ORDER — SODIUM CHLORIDE 0.45 % IV SOLN
Freq: Once | INTRAVENOUS | Status: AC
  Administered 2012-01-19: 10:00:00 via INTRAVENOUS

## 2012-01-18 NOTE — Telephone Encounter (Signed)
Spoke with pts mom- informed her of new arrival time of 10:00- she stated she would let pt know

## 2012-01-19 ENCOUNTER — Encounter (HOSPITAL_COMMUNITY): Payer: Self-pay | Admitting: *Deleted

## 2012-01-19 ENCOUNTER — Encounter (HOSPITAL_COMMUNITY)
Admission: RE | Disposition: A | Discharge status: Home / Self Care | Payer: Self-pay | Source: Ambulatory Visit | Attending: Gastroenterology

## 2012-01-19 ENCOUNTER — Ambulatory Visit (HOSPITAL_COMMUNITY)
Admission: RE | Admit: 2012-01-19 | Discharge: 2012-01-19 | Disposition: A | Discharge status: Home / Self Care | Payer: Medicaid Other | Source: Ambulatory Visit | Attending: Gastroenterology | Admitting: Gastroenterology

## 2012-01-19 DIAGNOSIS — K449 Diaphragmatic hernia without obstruction or gangrene: Secondary | ICD-10-CM | POA: Insufficient documentation

## 2012-01-19 DIAGNOSIS — R112 Nausea with vomiting, unspecified: Secondary | ICD-10-CM | POA: Insufficient documentation

## 2012-01-19 DIAGNOSIS — R1310 Dysphagia, unspecified: Secondary | ICD-10-CM

## 2012-01-19 DIAGNOSIS — K259 Gastric ulcer, unspecified as acute or chronic, without hemorrhage or perforation: Secondary | ICD-10-CM

## 2012-01-19 DIAGNOSIS — K257 Chronic gastric ulcer without hemorrhage or perforation: Secondary | ICD-10-CM | POA: Insufficient documentation

## 2012-01-19 DIAGNOSIS — K219 Gastro-esophageal reflux disease without esophagitis: Secondary | ICD-10-CM

## 2012-01-19 HISTORY — DX: Gastro-esophageal reflux disease without esophagitis: K21.9

## 2012-01-19 HISTORY — PX: ESOPHAGOGASTRODUODENOSCOPY: SHX5428

## 2012-01-19 SURGERY — EGD (ESOPHAGOGASTRODUODENOSCOPY)
Anesthesia: Moderate Sedation

## 2012-01-19 MED ORDER — MEPERIDINE HCL 100 MG/ML IJ SOLN
INTRAMUSCULAR | Status: DC | PRN
  Administered 2012-01-19: 25 mg via INTRAVENOUS
  Administered 2012-01-19: 50 mg via INTRAVENOUS

## 2012-01-19 MED ORDER — MIDAZOLAM HCL 5 MG/5ML IJ SOLN
INTRAMUSCULAR | Status: AC
  Filled 2012-01-19: qty 10

## 2012-01-19 MED ORDER — MIDAZOLAM HCL 5 MG/5ML IJ SOLN
INTRAMUSCULAR | Status: DC | PRN
  Administered 2012-01-19 (×2): 2 mg via INTRAVENOUS

## 2012-01-19 MED ORDER — STERILE WATER FOR IRRIGATION IR SOLN
Status: DC | PRN
  Administered 2012-01-19: 11:00:00

## 2012-01-19 MED ORDER — BUTAMBEN-TETRACAINE-BENZOCAINE 2-2-14 % EX AERO
INHALATION_SPRAY | CUTANEOUS | Status: DC | PRN
  Administered 2012-01-19: 1 via TOPICAL

## 2012-01-19 MED ORDER — MEPERIDINE HCL 100 MG/ML IJ SOLN
INTRAMUSCULAR | Status: AC
  Filled 2012-01-19: qty 2

## 2012-01-19 MED ORDER — OMEPRAZOLE 20 MG PO CPDR: DELAYED_RELEASE_CAPSULE | ORAL | Status: DC

## 2012-01-19 NOTE — Interval H&P Note (Signed)
History and Physical Interval Note:  01/19/2012 11:04 AM  [name]  has presented today for surgery, with the diagnosis of GERD, N/V  The various methods of treatment have been discussed with the patient and family. After consideration of risks, benefits and other options for treatment, the patient has consented to  Procedure(s) (LRB): ESOPHAGOGASTRODUODENOSCOPY (EGD) (N/A) as a surgical intervention .  The patients' history has been reviewed, patient examined, no change in status, stable for surgery.  I have reviewed the patients' chart and labs.  Questions were answered to the patient's satisfaction.     [employer]

## 2012-01-19 NOTE — Progress Notes (Signed)
REVIEWED.  

## 2012-01-19 NOTE — Op Note (Signed)
[facility] [address] [place], [place]  [address]  ENDOSCOPY PROCEDURE REPORT  PATIENT:  Isabel, Harrington  MR#:  [id number] BIRTHDATE:  August 30, 1986, 25 yrs. old  GENDER:  female  ENDOSCOPIST:  Jonette Eva, MD Referred by:  Butch Penny, M.D.  PROCEDURE DATE:  01/19/2012 PROCEDURE:  EGD with biopsy, 43239 ASA CLASS: INDICATIONS:  NAUSEA AND VOMITING PRIOR TO USING NAPROXEN. PT HAS N/V EVERY DAY SEVERAL TIMES A DAY. BMI 50-DENIES HAs, CHANGES IN VISION, OR RINGING IN HER EARS  MEDICATIONS:   Demerol 75 mg IV, Versed 4 mg IV TOPICAL ANESTHETIC:  Cetacaine Spray  DESCRIPTION OF PROCEDURE:     Physical exam was performed. Informed consent was obtained from the patient after explaining the benefits, risks, and alternatives to the procedure.  The patient was connected to the monitor and placed in the left lateral position.  Continuous oxygen was provided by nasal cannula and IV medicine administered through an indwelling cannula.  After administration of sedation, the patient's esophagus was intubated and the EG-[id number] ([id number]) endoscope was advanced under direct visualization to the second portion of the duodenum.  The scope was removed slowly by carefully examining the color, texture, anatomy, and integrity of the mucosa on the way out.  The patient was recovered in endoscopy and discharged home in satisfactory condition. <<PROCEDUREIMAGES>>  Multiple ulcers were found antrum & BIOPSIED VIA COLD FORCEPS.   A SMALL hiatal hernia was found. NL ESOPHAGUS & DUODENUM.  COMPLICATIONS:    None  ENDOSCOPIC IMPRESSION: 1) Ulcers, multiple in the antrum 2) Hiatal hernia 3) NAUSEA & VOMITING DUE TO GERD/PUD, LESS LIKELY GASRPARESIS OR PSEUDOTUMOR CEREBRI  RECOMMENDATIONS: BID OMP AWAIT BIOPSIES CONSIDER GES +/- NEUROLOGY CONSULT IF DEFINITE SOUREC CAN'T BE IDENTIFIED & PT CONTINUES TO VOMIT. AVOID NSAIDS FOR 2 MOS. LOW FAT DIET. LOSE 10-20 LB. OPV IN 4  MOS  REPEAT EXAM:  No  ______________________________ Jonette Eva, MD  CC:  n. eSIGNED:   Ailee Pates at 01/19/2012 02:11 PM  Richardean Sale, [phone]

## 2012-01-19 NOTE — H&P (View-Only) (Signed)
Primary Care Physician:  MCINNIS,ANGUS G, MD, MD Primary Gastroenterologist:  Dr. Fields  Chief Complaint  Patient presents with  . Abdominal Pain  . Emesis    HPI:  [name] is a 26 y.o. female here as a new patient for evaluation of chronic nausea, vomiting, and GERD symptoms.  She denies any previous history of GI complaints up until 2 months ago. She tells me she developed pain that started in her right lower quadrant. She was seen and evaluated by Dr.Eure. She was diagnosed with "muscle spasms". She then began to have chest pain and indigestion. She complains of daily heartburn. She's had multiple visits to the emergency room with workup including lab work, acute abdominal series, and CT scan without IV contrast of the abdomen and pelvis as below. Recently she has been unable to keep anything down. She is vomiting 4-5 times per day. She is taking Phenergan every 6 hours as needed at home with minimal relief. She tells me her bowel movements have been normal, denies any rectal bleeding or melena. She denies any constipation or diarrhea. Her weight is steadily increasing, although her appetite has been quite poor over the last several months. She has been on naproxen twice a day since February 18. She denies any over-the-counter NSAIDs. She denies any previous history of acid reflux or interval bowel syndrome. She denies any ill contacts. She has had a recent urinary pregnancy test which was negative. Her LMP was 12/13/2011 and she is on her birth control.  She did have a positive d-dimer with a CT angiogram of the chest to rule out pulmonary embolus which was negative. She has had some numbness and tingling down her left arm.  01/09/12 urinalysis and urine pregnancy negative, lipase normal, TIS negative, CMP negative, and d-dimer positive at 0.61. Lab Summary Latest Ref Rng 01/09/2012 01/03/2012 11/28/2011  Hemoglobin 12.0 - 15.0 g/dL 13.9 14.8 14.4  Hematocrit 36.0 - 46.0 % 41.5 43.5 41.4    White count 4.0 - 10.5 K/uL 7.2 11.5 (H) 10.1  Platelet count 150 - 400 K/uL 208 256 250  Sodium 135 - 145 mEq/L 138 138 138  Potassium 3.5 - 5.1 mEq/L 3.9 3.7 4.1  Calcium 8.4 - 10.5 mg/dL 9.6 9.7 9.6  Phosphorus  (None) (None) (None)  Creatinine 0.50 - 1.10 mg/dL 0.73 0.78 0.62  AST 0 - 37 U/L 34 (None) 27  Alk Phos 39 - 117 U/L 113 (None) 90  Bilirubin 0.3 - 1.2 mg/dL 0.3 (None) 0.4  Glucose 70 - 99 mg/dL 84 99 79  Cholesterol  (None) (None) (None)  HDL cholesterol  (None) (None) (None)  Triglycerides  (None) (None) (None)  LDL Direct  (None) (None) (None)  LDL Calc  (None) (None) (None)  Total protein 6.0 - 8.3 g/dL 7.1 (None) 7.4  Albumin 3.5 - 5.2 g/dL 3.5 (None) 3.3 (L)  11/28/11 ACUTE ABDOMEN SERIES:nonobstructed bowel gas pattern, no free air.  Negative chest.  11/25/2011 CT ABDOMEN AND PELVIS WITH ORAL/IV CONTRAST:   Spleen appears enlarged, 15.2 x 9.1 x 12.1 cm. Scattered normal-sized mesenteric lymph nodes. Otherwise normal.  01/03/12 CT ANGIOGRAPHY CHEST: No pulmonary emboli or acute abnormality.  2/23/13ACUTE ABDOMEN SERIES:No acute or significant abnormalities identified.   Past Medical History  Diagnosis Date  . Herpes     No past surgical history on file.  Current Outpatient Prescriptions  Medication Sig Dispense Refill  . acetaminophen (TYLENOL) 500 MG tablet Take 500 mg by mouth every 6 (six) hours as   needed. For pain      . naproxen (NAPROSYN) 500 MG tablet Take 1 tablet (500 mg total) by mouth 2 (two) times daily.  14 tablet  0  . norgestimate-ethinyl estradiol (SPRINTEC 28) 0.25-35 MG-MCG tablet Take 1 tablet by mouth at bedtime. AT 11PM       . promethazine (PHENERGAN) 25 MG tablet Take 1 tablet (25 mg total) by mouth every 6 (six) hours as needed for nausea.  8 tablet  0  . valACYclovir (VALTREX) 1000 MG tablet Take 1,000 mg by mouth at bedtime.       . omeprazole (PRILOSEC) 20 MG capsule 1 po 30 minutes before breakfast daily  31 capsule  2  .  ondansetron (ZOFRAN) 4 MG tablet Take 1 tablet (4 mg total) by mouth every 8 (eight) hours as needed for nausea.  30 tablet  0    Allergies as of 01/11/2012  . (No Known Allergies)    Family History  Problem Relation Age of Onset  . Diabetes Mother   . Diabetes Other   . Hypertension Other     History   Social History  . Marital Status: Single    Spouse Name: N/A    Number of Children: 1  . Years of Education: N/A   Occupational History  .    . Unemployed    Social History Main Topics  . Smoking status: Former Smoker -- 3 years    Types: Cigarettes    Quit date: 07/28/2011  . Smokeless tobacco: Never Used  . Alcohol Use: No  . Drug Use: No  . Sexually Active: Yes    Birth Control/ Protection: Pill   Other Topics Concern  . Not on file   Social History Narrative  . No narrative on file    Review of Systems: Gen: Denies any fever, chills, sweats, anorexia, fatigue, weakness, malaise, weight loss, and sleep disorder CV: Denies chest pain, angina, palpitations, syncope, orthopnea, PND, peripheral edema, and claudication. Resp: Denies dyspnea at rest, dyspnea with exercise, cough, sputum, wheezing, coughing up blood, and pleurisy. GI: Denies vomiting blood, jaundice, and fecal incontinence.   Denies dysphagia or odynophagia. GU : Denies urinary burning, blood in urine, urinary frequency, urinary hesitancy, nocturnal urination, and urinary incontinence. MS: Denies joint pain, limitation of movement, and swelling, stiffness, low back pain, extremity pain. Denies muscle weakness, cramps, atrophy.  Derm: Denies rash, itching, dry skin, hives, moles, warts, or unhealing ulcers.  Psych: Denies depression, anxiety, memory loss, suicidal ideation, hallucinations, paranoia, and confusion. Heme: Denies bruising, bleeding, and enlarged lymph nodes. Neuro:  Denies any headaches, dizziness, paresthesias. Endo:  Denies any problems with DM, thyroid, adrenal function.  Physical  Exam: BP 128/73  Pulse 90  Temp(Src) 98 F (36.7 C) (Temporal)  Ht 5' 6" (1.676 m)  Wt 310 lb (140.615 kg)  BMI 50.04 kg/m2  LMP 12/13/2011 General:   Alert,  Well-developed, obese, pleasant and cooperative in NAD.  She is accompanied by her mother, son, and fianc today. Head:  Normocephalic and atraumatic. Eyes:  Sclera clear, no icterus.   Conjunctiva pink. Ears:  Normal auditory acuity. Nose:  No deformity, discharge, or lesions. Mouth:  No deformity or lesions,oropharynx pink & moist. Neck:  Supple; no masses or thyromegaly. Lungs:  Clear throughout to auscultation.   No wheezes, crackles, or rhonchi. No acute distress. Heart:  Regular rate and rhythm; no murmurs, clicks, rubs,  or gallops. Abdomen:  Normal bowel sounds.  No bruits.  Soft, non-tender and   non-distended without masses, hepatosplenomegaly or hernias noted.  No guarding or rebound tenderness.   Rectal:  Deferred. Msk:  Symmetrical without gross deformities. Normal posture. Pulses:  Normal pulses noted. Extremities:  No clubbing or edema. Neurologic:  Alert and  oriented x4;  grossly normal neurologically. Skin:  Intact without significant lesions or rashes. Lymph Nodes:  No significant cervical adenopathy. Psych:  Alert and cooperative. Normal mood and affect.  

## 2012-01-19 NOTE — Discharge Instructions (Signed)
You have ULCERS FROM TAKING NAPROXEN. I biopsied your stomach.  TAKE OMEPRAZOLE 30 MINUTES PRIOR TO MEALS TWICE DAILY FOR 3 MOS THEN ONCE DAILY FOR THE NEXT YEAR. AVOID TRIGGERS FOR GASTRITIS. SEE INFO BELOW. AVOID NAPROXEN AND IBUPROFEN FOR 2 MONTHS. USE TYLENOL AS NEEDED FOR PAIN. FOLLOW A LOW FAT DIET. SEE INFO BELOW. YOU SHOULD LOSE 10 TO 20 LBS.  FOLLOW UP IN 4 MOS.  UPPER ENDOSCOPY AFTER CARE Read the instructions outlined below and refer to this sheet in the next week. These discharge instructions provide you with general information on caring for yourself after you leave the hospital. While your treatment has been planned according to the most current medical practices available, unavoidable complications occasionally occur. If you have any problems or questions after discharge, call DR. Justyn Langham, [phone].  ACTIVITY  You may resume your regular activity, but move at a slower pace for the next 24 hours.   Take frequent rest periods for the next 24 hours.   Walking will help get rid of the air and reduce the bloated feeling in your belly (abdomen).   No driving for 24 hours (because of the medicine (anesthesia) used during the test).   You may shower.   Do not sign any important legal documents or operate any machinery for 24 hours (because of the anesthesia used during the test).    NUTRITION  Drink plenty of fluids.   You may resume your normal diet as instructed by your doctor.   Begin with a light meal and progress to your normal diet. Heavy or fried foods are harder to digest and may make you feel sick to your stomach (nauseated).   Avoid alcoholic beverages for 24 hours or as instructed.    MEDICATIONS  You may resume your normal medications.   WHAT YOU CAN EXPECT TODAY  Some feelings of bloating in the abdomen.   Passage of more gas than usual.    IF YOU HAD A BIOPSY TAKEN DURING THE UPPER ENDOSCOPY:  Eat a soft diet IF YOU HAVE NAUSEA, BLOATING,  ABDOMINAL PAIN, OR VOMITING.    FINDING OUT THE RESULTS OF YOUR TEST Not all test results are available during your visit. DR. Darrick Penna WILL CALL YOU WITHIN 7 DAYS OF YOUR PROCEDUE WITH YOUR RESULTS. Do not assume everything is normal if you have not heard from DR. Bess Saltzman IN ONE WEEK, CALL HER OFFICE AT [phone].  SEEK IMMEDIATE MEDICAL ATTENTION AND CALL THE OFFICE: [phone] IF:  You have more than a spotting of blood in your stool.   Your belly is swollen (abdominal distention).   You are nauseated or vomiting.   You have a temperature over 101F.   You have abdominal pain or discomfort that is severe or gets worse throughout the day.   Gastritis  Gastritis is an inflammation (the body's way of reacting to injury and/or infection) of the stomach. It is often caused by viral or bacterial (germ) infections. It can also be caused BY ASPIRIN, BC/GOODY POWDER'S, (IBUPROFEN) MOTRIN, OR ALEVE (NAPROXEN), chemicals (including alcohol), SPICY FOODS, and medications. This illness may be associated with generalized malaise (feeling tired, not well), UPPER ABDOMINAL STOMACH cramps, and fever. One common bacterial cause of gastritis is an organism known as H. Pylori. This can be treated with antibiotics.    Low-Fat Diet  BREADS, CEREALS, PASTA, RICE, DRIED PEAS, AND BEANS These products are high in carbohydrates and most are low in fat. Therefore, they can be increased in the diet as  substitutes for fatty foods. They too, however, contain calories and should not be eaten in excess. Cereals can be eaten for snacks as well as for breakfast.  Include foods that contain fiber (fruits, vegetables, whole grains, and legumes). Research shows that fiber may lower blood cholesterol levels, especially the water-soluble fiber found in fruits, vegetables, oat products, and legumes.  FRUITS AND VEGETABLES It is good to eat fruits and vegetables. Besides being sources of fiber, both are rich in vitamins  and some minerals. They help you get the daily allowances of these nutrients. Fruits and vegetables can be used for snacks and desserts.  MEATS Limit lean meat, chicken, [place], and fish to no more than 6 ounces per day.  Beef, Pork, and Lamb Use lean cuts of beef, pork, and lamb. Lean cuts include:  Extra-lean ground beef.  Arm roast.  Sirloin tip.  Center-cut ham.  Round steak.  Loin chops.  Rump roast.  Tenderloin.  Trim all fat off the outside of meats before cooking. It is not necessary to severely decrease the intake of red meat, but lean choices should be made. Lean meat is rich in protein and contains a highly absorbable form of iron. Premenopausal women, in particular, should avoid reducing lean red meat because this could increase the risk for low red blood cells (iron-deficiency anemia).  Chicken and [place] These are good sources of protein. The fat of poultry can be reduced by removing the skin and underlying fat layers before cooking. Chicken and [place] can be substituted for lean red meat in the diet. Poultry should not be fried or covered with high-fat sauces.  Fish and Shellfish Fish is a good source of protein. Shellfish contain cholesterol, but they usually are low in saturated fatty acids. The preparation of fish is important. Like chicken and [place], they should not be fried or covered with high-fat sauces.  EGGS Egg whites contain no fat or cholesterol. They can be eaten often. Try 1 to 2 egg whites instead of whole eggs in recipes or use egg substitutes that do not contain yolk.  MILK AND DAIRY PRODUCTS Use skim or 1% milk instead of 2% or whole milk. Decrease whole milk, natural, and processed cheeses. Use nonfat or low-fat (2%) cottage cheese or low-fat cheeses made from vegetable oils. Choose nonfat or low-fat (1 to 2%) yogurt. Experiment with evaporated skim milk in recipes that call for heavy cream. Substitute low-fat yogurt or low-fat cottage cheese for sour  cream in dips and salad dressings. Have at least 2 servings of low-fat dairy products, such as 2 glasses of skim (or 1%) milk each day to help get your daily calcium intake.  FATS AND OILS Reduce the total intake of fats, especially saturated fat. Butterfat, lard, and beef fats are high in saturated fat and cholesterol. These should be avoided as much as possible. Vegetable fats do not contain cholesterol, but certain vegetable fats, such as coconut oil, palm oil, and palm kernel oil are very high in saturated fats. These should be limited. These fats are often used in bakery goods, processed foods, popcorn, oils, and nondairy creamers. Vegetable shortenings and some peanut butters contain hydrogenated oils, which are also saturated fats. Read the labels on these foods and check for saturated vegetable oils.  Unsaturated vegetable oils and fats do not raise blood cholesterol. However, they should be limited because they are fats and are high in calories. Total fat should still be limited to 30% of your daily caloric intake. Desirable  liquid vegetable oils are corn oil, cottonseed oil, olive oil, canola oil, safflower oil, soybean oil, and sunflower oil. Peanut oil is not as good, but small amounts are acceptable. Buy a heart-healthy tub margarine that has no partially hydrogenated oils in the ingredients. Mayonnaise and salad dressings often are made from unsaturated fats, but they should also be limited because of their high calorie and fat content. Seeds, nuts, peanut butter, olives, and avocados are high in fat, but the fat is mainly the unsaturated type. These foods should be limited mainly to avoid excess calories and fat.  OTHER EATING TIPS Snacks  Most sweets should be limited as snacks. They tend to be rich in calories and fats, and their caloric content outweighs their nutritional value. Some good choices in snacks are graham crackers, melba toast, soda crackers, bagels (no egg), English muffins,  fruits, and vegetables. These snacks are preferable to snack crackers, [place] fries, and chips. Popcorn should be air-popped or cooked in small amounts of liquid vegetable oil.  Desserts Eat fruit, low-fat yogurt, and fruit ices instead of pastries, cake, and cookies. Sherbet, angel food cake, gelatin dessert, frozen low-fat yogurt, or other frozen products that do not contain saturated fat (pure fruit juice bars, frozen ice pops) are also acceptable.   COOKING METHODS Choose those methods that use little or no fat. They include: Poaching.  Braising.  Steaming.  Grilling.  Baking.  Stir-frying.  Broiling.  Microwaving.  Foods can be cooked in a nonstick pan without added fat, or use a nonfat cooking spray in regular cookware. Limit fried foods and avoid frying in saturated fat. Add moisture to lean meats by using water, broth, cooking wines, and other nonfat or low-fat sauces along with the cooking methods mentioned above. Soups and stews should be chilled after cooking. The fat that forms on top after a few hours in the refrigerator should be skimmed off. When preparing meals, avoid using excess salt. Salt can contribute to raising blood pressure in some people.  EATING AWAY FROM HOME Order entres, potatoes, and vegetables without sauces or butter. When meat exceeds the size of a deck of cards (3 to 4 ounces), the rest can be taken home for another meal. Choose vegetable or fruit salads and ask for low-calorie salad dressings to be served on the side. Use dressings sparingly. Limit high-fat toppings, such as bacon, crumbled eggs, cheese, sunflower seeds, and olives. Ask for heart-healthy tub margarine instead of butter.

## 2012-01-20 ENCOUNTER — Telehealth: Payer: Self-pay | Admitting: Gastroenterology

## 2012-01-20 NOTE — Telephone Encounter (Signed)
Pt informed

## 2012-01-20 NOTE — Telephone Encounter (Signed)
IF VOMITING CONTINUES PT NEEDS A GES.

## 2012-01-20 NOTE — Telephone Encounter (Signed)
PLEASE CALL PT. HER BIOPSY SHOWS SHE HAS ULCERS FROM TAKING NAPROXEN.  TAKE OMEPRAZOLE 30 MINUTES PRIOR TO MEALS TWICE DAILY FOR 3 MOS THEN ONCE DAILY FOR THE NEXT YEAR. AVOID TRIGGERS FOR GASTRITIS.  AVOID NAPROXEN AND IBUPROFEN FOR 2 MONTHS. USE TYLENOL AS NEEDED FOR PAIN. FOLLOW A LOW FAT DIET.  YOU SHOULD LOSE 10 TO 20 LBS.  FOLLOW UP IN 4 MOS.  CALL ME IN 2-3 WEEKS IF YOU CONTINUE TO VOMIT.

## 2012-01-21 NOTE — Telephone Encounter (Signed)
Results Cc to PCP  

## 2012-01-22 ENCOUNTER — Encounter (HOSPITAL_COMMUNITY): Payer: Self-pay | Admitting: Gastroenterology

## 2012-01-22 NOTE — Telephone Encounter (Signed)
Pt is scheduled to see KJ 07/05

## 2012-02-01 ENCOUNTER — Encounter (HOSPITAL_COMMUNITY): Payer: Self-pay | Admitting: *Deleted

## 2012-02-01 ENCOUNTER — Emergency Department (HOSPITAL_COMMUNITY)
Admission: EM | Admit: 2012-02-01 | Discharge: 2012-02-01 | Disposition: A | Discharge status: Home / Self Care | Payer: Medicaid Other | Attending: Emergency Medicine | Admitting: Emergency Medicine

## 2012-02-01 DIAGNOSIS — M79672 Pain in left foot: Secondary | ICD-10-CM

## 2012-02-01 DIAGNOSIS — K219 Gastro-esophageal reflux disease without esophagitis: Secondary | ICD-10-CM | POA: Insufficient documentation

## 2012-02-01 DIAGNOSIS — Z87891 Personal history of nicotine dependence: Secondary | ICD-10-CM | POA: Insufficient documentation

## 2012-02-01 DIAGNOSIS — B009 Herpesviral infection, unspecified: Secondary | ICD-10-CM | POA: Insufficient documentation

## 2012-02-01 DIAGNOSIS — M79609 Pain in unspecified limb: Secondary | ICD-10-CM | POA: Insufficient documentation

## 2012-02-01 MED ORDER — TRAMADOL HCL 50 MG PO TABS
50.0000 mg | ORAL_TABLET | Freq: Once | ORAL | Status: AC
  Administered 2012-02-01: 50 mg via ORAL
  Filled 2012-02-01: qty 1

## 2012-02-01 MED ORDER — TRAMADOL HCL 50 MG PO TABS: 50.0000 mg | ORAL_TABLET | Freq: Four times a day (QID) | ORAL | Status: AC | PRN

## 2012-02-01 NOTE — ED Notes (Signed)
Pain lt foot, says she went to a wedding yesterday and thinks the shoes she was wearing caused her to have pain,  No injury, or swelling seen.

## 2012-02-01 NOTE — ED Provider Notes (Signed)
History     CSN: [id number]  Arrival date & time 02/01/12  1408   First MD Initiated Contact with Patient 02/01/12 1557      Chief Complaint  Patient presents with  . Foot Pain    (Consider location/radiation/quality/duration/timing/severity/associated sxs/prior treatment) HPI Comments: L foot began hurting after wearing a pair of flat shoes to a wedding yest.  No known injury.  Pain with weight bearing and movement.  Patient is a 26 y.o. [place] presenting with lower extremity pain. The history is provided by the patient. No language interpreter was used.  Foot Pain This is a new problem. The current episode started yesterday. The problem occurs constantly. The problem has been unchanged. The symptoms are aggravated by walking and standing. She has tried acetaminophen for the symptoms. The treatment provided no relief.    Past Medical History  Diagnosis Date  . Herpes   . GERD (gastroesophageal reflux disease)     Past Surgical History  Procedure Date  . Wisdom tooth extraction   . Esophagogastroduodenoscopy 01/19/2012    Procedure: ESOPHAGOGASTRODUODENOSCOPY (EGD);  Surgeon: West Bali, MD;  Location: [facility];  Service: Endoscopy;  Laterality: N/A;  1:30    Family History  Problem Relation Age of Onset  . Diabetes Mother   . Diabetes Other   . Hypertension Other     History  Substance Use Topics  . Smoking status: Former Smoker -- 3 years    Types: Cigarettes    Quit date: 07/28/2011  . Smokeless tobacco: Never Used  . Alcohol Use: No    OB History    Grav Para Term Preterm Abortions TAB SAB Ect Mult Living   1 1 1       1       Review of Systems  Musculoskeletal:       Foot pain  All other systems reviewed and are negative.    Allergies  Review of patient's allergies indicates not on file.  Home Medications   Current Outpatient Rx  Name Route Sig Dispense Refill  . ACETAMINOPHEN 500 MG PO TABS Oral Take 500 mg by mouth every 6 (six)  hours as needed. For pain    . NORGESTIMATE-ETH ESTRADIOL 0.25-35 MG-MCG PO TABS Oral Take 1 tablet by mouth at bedtime. AT 11PM     . OMEPRAZOLE 20 MG PO CPDR  1 po 30 minutes before breakfast AND SUPPER Isabel capsule 11  . VALACYCLOVIR HCL 1 G PO TABS Oral Take 1,000 mg by mouth at bedtime.     . TRAMADOL HCL 50 MG PO TABS Oral Take 1 tablet (50 mg total) by mouth every 6 (six) hours as needed for pain. 20 tablet 0    BP 110/64  Pulse 92  Temp(Src) 98.6 F (37 C) (Oral)  Resp 20  Ht 5\' 6"  (1.676 m)  Wt 310 lb (140.615 kg)  BMI 50.04 kg/m2  LMP 01/14/2012  Physical Exam  Nursing note and vitals reviewed. Constitutional: She is oriented to person, place, and time. She appears well-developed and well-nourished. No distress.  HENT:  Head: Normocephalic and atraumatic.  Eyes: EOM are normal.  Neck: Normal range of motion.  Cardiovascular: Normal rate, regular rhythm and normal heart sounds.   Pulmonary/Chest: Effort normal and breath sounds normal.  Abdominal: Soft. She exhibits no distension. There is no tenderness.  Musculoskeletal: She exhibits tenderness.       Left foot: She exhibits decreased range of motion and tenderness. She exhibits no bony tenderness,  no swelling, normal capillary refill, no crepitus and no deformity.       Feet:  Neurological: She is alert and oriented to person, place, and time.  Skin: Skin is warm and dry.  Psychiatric: She has a normal mood and affect. Judgment normal.    ED Course  Procedures (including critical care time)  Labs Reviewed - No data to display No results found.   1. Left foot pain       MDM  Crutches Ice, Elevation, rx-tramadol F/u with PCP prn        Worthy Rancher, PA 02/01/12 1642  Worthy Rancher, PA 02/01/12 [phone]

## 2012-02-01 NOTE — ED Notes (Signed)
Left foot pain onset yesterday 

## 2012-02-01 NOTE — Discharge Instructions (Signed)
Cryotherapy Cryotherapy means treatment with cold. Ice or gel packs can be used to reduce both pain and swelling. Ice is the most helpful within the first 24 to 48 hours after an injury or flareup from overusing a muscle or joint. Sprains, strains, spasms, burning pain, shooting pain, and aches can all be eased with ice. Ice can also be used when recovering from surgery. Ice is effective, has very few side effects, and is safe for most people to use. PRECAUTIONS  Ice is not a safe treatment option for people with:  Raynaud's phenomenon. This is a condition affecting small blood vessels in the extremities. Exposure to cold may cause your problems to return.   Cold hypersensitivity. There are many forms of cold hypersensitivity, including:   Cold urticaria. Red, itchy hives appear on the skin when the tissues begin to warm after being iced.   Cold erythema. This is a red, itchy rash caused by exposure to cold.   Cold hemoglobinuria. Red blood cells break down when the tissues begin to warm after being iced. The hemoglobin that carry oxygen are passed into the urine because they cannot combine with blood proteins fast enough.   Numbness or altered sensitivity in the area being iced.  If you have any of the following conditions, do not use ice until you have discussed cryotherapy with your caregiver:  Heart conditions, such as arrhythmia, angina, or chronic heart disease.   High blood pressure.   Healing wounds or open skin in the area being iced.   Current infections.   Rheumatoid arthritis.   Poor circulation.   Diabetes.  Ice slows the blood flow in the region it is applied. This is beneficial when trying to stop inflamed tissues from spreading irritating chemicals to surrounding tissues. However, if you expose your skin to cold temperatures for too long or without the proper protection, you can damage your skin or nerves. Watch for signs of skin damage due to cold. HOME CARE  INSTRUCTIONS Follow these tips to use ice and cold packs safely.  Place a dry or damp towel between the ice and skin. A damp towel will cool the skin more quickly, so you may need to shorten the time that the ice is used.   For a more rapid response, add gentle compression to the ice.   Ice for no more than 10 to 20 minutes at a time. The bonier the area you are icing, the less time it will take to get the benefits of ice.   Check your skin after 5 minutes to make sure there are no signs of a poor response to cold or skin damage.   Rest 20 minutes or more in between uses.   Once your skin is numb, you can end your treatment. You can test numbness by very lightly touching your skin. The touch should be so light that you do not see the skin dimple from the pressure of your fingertip. When using ice, most people will feel these normal sensations in this order: cold, burning, aching, and numbness.   Do not use ice on someone who cannot communicate their responses to pain, such as small children or people with dementia.  HOW TO MAKE AN ICE PACK Ice packs are the most common way to use ice therapy. Other methods include ice massage, ice baths, and cryo-sprays. Muscle creams that cause a cold, tingly feeling do not offer the same benefits that ice offers and should not be used as a substitute  unless recommended by your caregiver. To make an ice pack, do one of the following:  Place crushed ice or a bag of frozen vegetables in a sealable plastic bag. Squeeze out the excess air. Place this bag inside another plastic bag. Slide the bag into a pillowcase or place a damp towel between your skin and the bag.   Mix 3 parts water with 1 part rubbing alcohol. Freeze the mixture in a sealable plastic bag. When you remove the mixture from the freezer, it will be slushy. Squeeze out the excess air. Place this bag inside another plastic bag. Slide the bag into a pillowcase or place a damp towel between your skin  and the bag.  SEEK MEDICAL CARE IF:  You develop white spots on your skin. This may give the skin a blotchy (mottled) appearance.   Your skin turns blue or pale.   Your skin becomes waxy or hard.   Your swelling gets worse.  MAKE SURE YOU:   Understand these instructions.   Will watch your condition.   Will get help right away if you are not doing well or get worse.  Document Released: 06/29/2011 Document Revised: 10/22/2011 Document Reviewed: 06/29/2011 [facility] Patient Information 2012 [place], [place].Crutch Use You have been prescribed crutches to take weight off one of your lower legs or feet (extremities). When using crutches, make sure you are not putting pressure on the armpit (axilla). This could cause damage to the nerves that extend from your axilla to the hand and arm. When fitted properly the crutches should be 2 to 3 finger widths below the axilla. Your weight should be supported by your hand, and not by resting upon the crutch with the axilla. When walking, first step with the crutches, then swing the healthy leg through and slightly ahead. When going up stairs, first step up with the healthy leg and then follow with the crutches and injured leg up to the same step, and so forth. If there is a handrail, hold both crutches in one hand, place your other hand on the handrail, and while placing your weight on your arms, lift your good leg to the step, then bring the crutches and the injured leg up to that step. Repeat for each step. When going down stairs, first step with the injured leg and crutches, following down with the healthy leg to the same step. Be very careful, as going down stairs with crutches is very challenging. If you feel wobbly or nervous, sit down and inch yourself down the stairs on your butt. To get up from a chair, hold injured leg forward, grab armrest with one hand and the top of the crutches with the other hand. Using these supports, pull yourself up to a  standing position. Reverse this procedure for sitting. See your caregiver for follow up as suggested. If you are discharged in an ace wrap and develop numbness, tingling, swelling, or increased pain, loosen the ace wrap and re-wrap looser. If these problems persist, see your caregiver as needed. If you have been instructed to use partial weight bearing, bear (apply) the amount of weight as suggested by your caregiver. Do not bear weight in an amount that causes pain on the area of injury. Document Released: 10/30/2000 Document Revised: 10/22/2011 Document Reviewed: 01/07/2009 [facility] Patient Information 2012 [place], [place].  Wear the ace wrap for comfort.  Use the crutches as needed and bear weight as tolerated.  Apply ice several times daily and elevate foot as much as possible.  Take the tramadol as directed.  Follow up with dr. Soyla Murphy as needed.

## 2012-02-05 NOTE — ED Provider Notes (Signed)
Medical screening examination/treatment/procedure(s) were performed by non-physician practitioner and as supervising physician I was immediately available for consultation/collaboration.   Shelda Jakes, MD 02/05/12 [phone]

## 2012-02-08 ENCOUNTER — Other Ambulatory Visit (HOSPITAL_COMMUNITY): Payer: Self-pay | Admitting: Family Medicine

## 2012-02-08 ENCOUNTER — Ambulatory Visit (HOSPITAL_COMMUNITY)
Admission: RE | Admit: 2012-02-08 | Discharge: 2012-02-08 | Disposition: A | Discharge status: Home / Self Care | Payer: Medicaid Other | Source: Ambulatory Visit | Attending: Family Medicine | Admitting: Family Medicine

## 2012-02-08 DIAGNOSIS — R52 Pain, unspecified: Secondary | ICD-10-CM

## 2012-02-08 DIAGNOSIS — M7730 Calcaneal spur, unspecified foot: Secondary | ICD-10-CM | POA: Insufficient documentation

## 2012-02-20 ENCOUNTER — Emergency Department (HOSPITAL_COMMUNITY)
Admission: EM | Admit: 2012-02-20 | Discharge: 2012-02-20 | Disposition: A | Discharge status: Home / Self Care | Payer: Medicaid Other | Attending: Emergency Medicine | Admitting: Emergency Medicine

## 2012-02-20 ENCOUNTER — Encounter (HOSPITAL_COMMUNITY): Payer: Self-pay | Admitting: Emergency Medicine

## 2012-02-20 DIAGNOSIS — R112 Nausea with vomiting, unspecified: Secondary | ICD-10-CM | POA: Insufficient documentation

## 2012-02-20 DIAGNOSIS — R109 Unspecified abdominal pain: Secondary | ICD-10-CM | POA: Insufficient documentation

## 2012-02-20 DIAGNOSIS — N390 Urinary tract infection, site not specified: Secondary | ICD-10-CM | POA: Insufficient documentation

## 2012-02-20 LAB — COMPREHENSIVE METABOLIC PANEL
ALT: 24 U/L (ref 0–35)
AST: 35 U/L (ref 0–37)
Alkaline Phosphatase: 110 U/L (ref 39–117)
CO2: 25 mEq/L (ref 19–32)
Calcium: 9.4 mg/dL (ref 8.4–10.5)
GFR calc non Af Amer: >90 mL/min (ref >90)
Glucose, Bld: 91 mg/dL (ref 70–99)
Potassium: 3.4 mEq/L — ABNORMAL LOW (ref 3.5–5.1)
Sodium: 136 mEq/L (ref 135–145)
Total Protein: 7 g/dL (ref 6.0–8.3)

## 2012-02-20 LAB — URINE MICROSCOPIC-ADD ON

## 2012-02-20 LAB — CBC
MCV: 86.5 fL (ref 78.0–100.0)
Platelets: 184 10*3/uL (ref 150–400)
RBC: 4.46 MIL/uL (ref 3.87–5.11)
RDW: 13.6 % (ref 11.5–15.5)
WBC: 8.2 10*3/uL (ref 4.0–10.5)

## 2012-02-20 LAB — URINALYSIS, ROUTINE W REFLEX MICROSCOPIC
Bilirubin Urine: NEGATIVE
Specific Gravity, Urine: <1.005 — ABNORMAL LOW (ref 1.005–1.030)
pH: 6 (ref 5.0–8.0)

## 2012-02-20 LAB — DIFFERENTIAL
Basophils Absolute: 0 10*3/uL (ref 0.0–0.1)
Eosinophils Relative: 0 % (ref 0–5)
Lymphocytes Relative: 17 % (ref 12–46)
Lymphs Abs: 1.4 10*3/uL (ref 0.7–4.0)
Neutrophils Relative %: 74 % (ref 43–77)

## 2012-02-20 MED ORDER — CEPHALEXIN 500 MG PO CAPS: 500.0000 mg | ORAL_CAPSULE | Freq: Four times a day (QID) | ORAL | Status: AC

## 2012-02-20 MED ORDER — ONDANSETRON HCL 4 MG PO TABS: 4.0000 mg | ORAL_TABLET | Freq: Three times a day (TID) | ORAL | Status: AC | PRN

## 2012-02-20 MED ORDER — SODIUM CHLORIDE 0.9 % IV BOLUS (SEPSIS)
1000.0000 mL | Freq: Once | INTRAVENOUS | Status: AC
  Administered 2012-02-20: 1000 mL via INTRAVENOUS

## 2012-02-20 MED ORDER — FAMOTIDINE IN NACL 20-0.9 MG/50ML-% IV SOLN
20.0000 mg | Freq: Once | INTRAVENOUS | Status: AC
  Administered 2012-02-20: 20 mg via INTRAVENOUS
  Filled 2012-02-20: qty 50

## 2012-02-20 MED ORDER — ONDANSETRON HCL 4 MG/2ML IJ SOLN
4.0000 mg | INTRAMUSCULAR | Status: DC | PRN
  Administered 2012-02-20: 4 mg via INTRAVENOUS
  Filled 2012-02-20: qty 2

## 2012-02-20 MED ORDER — SODIUM CHLORIDE 0.9 % IV SOLN
INTRAVENOUS | Status: DC
  Administered 2012-02-20: 1000 mL via INTRAVENOUS

## 2012-02-20 MED ORDER — GI COCKTAIL ~~LOC~~
30.0000 mL | Freq: Once | ORAL | Status: AC
  Administered 2012-02-20: 30 mL via ORAL
  Filled 2012-02-20: qty 30

## 2012-02-20 NOTE — ED Notes (Signed)
Dr. McManus at bedside. 

## 2012-02-20 NOTE — ED Provider Notes (Signed)
History     CSN: [id number]  Arrival date & time 02/20/12  1302   First MD Initiated Contact with Patient 02/20/12 1312      Chief Complaint  Patient presents with  . Abdominal Pain  . Nausea  . Emesis    HPI Pt was seen at 1325.  Per pt, c/o gradual onset and persistence of constant upper abd "pain" since last night.  Has been assoc with multiple intermittent episodes of N/V.  Denies diarrhea, no back pain, no CP/SOB, no fevers, no black or blood in stools or emesis.      Past Medical History  Diagnosis Date  . Herpes   . GERD (gastroesophageal reflux disease)     Past Surgical History  Procedure Date  . Wisdom tooth extraction   . Esophagogastroduodenoscopy 01/19/2012    Procedure: ESOPHAGOGASTRODUODENOSCOPY (EGD);  Surgeon: West Bali, MD;  Location: [facility];  Service: Endoscopy;  Laterality: N/A;  1:30    Family History  Problem Relation Age of Onset  . Diabetes Mother   . Diabetes Other   . Hypertension Other     History  Substance Use Topics  . Smoking status: Former Smoker -- 3 years    Types: Cigarettes    Quit date: 07/28/2011  . Smokeless tobacco: Never Used  . Alcohol Use: No    OB History    Grav Para Term Preterm Abortions TAB SAB Ect Mult Living   1 1 1       1       Review of Systems ROS: Statement: All systems negative except as marked or noted in the HPI; Constitutional: Negative for fever and chills. ; ; Eyes: Negative for eye pain, redness and discharge. ; ; ENMT: Negative for ear pain, hoarseness, nasal congestion, sinus pressure and sore throat. ; ; Cardiovascular: Negative for chest pain, palpitations, diaphoresis, dyspnea and peripheral edema. ; ; Respiratory: Negative for cough, wheezing and stridor. ; ; Gastrointestinal: +N/V, abd pain.  Negative diarrhea, blood in stool, hematemesis, jaundice and rectal bleeding. . ; ; Genitourinary: Negative for dysuria, flank pain and hematuria. ; ; Musculoskeletal: Negative for back pain and  neck pain. Negative for swelling and trauma.; ; Skin: Negative for pruritus, rash, abrasions, blisters, bruising and skin lesion.; ; Neuro: Negative for headache, lightheadedness and neck stiffness. Negative for weakness, altered level of consciousness , altered mental status, extremity weakness, paresthesias, involuntary movement, seizure and syncope.     Allergies  Review of patient's allergies indicates no known allergies.  Home Medications   Current Outpatient Rx  Name Route Sig Dispense Refill  . HYDROCODONE-ACETAMINOPHEN 5-325 MG PO TABS Oral Take 1 tablet by mouth every 4 (four) hours as needed. For pain    . MELOXICAM 15 MG PO TABS Oral Take 15 mg by mouth daily.    Marland Kitchen NORGESTIMATE-ETH ESTRADIOL 0.25-35 MG-MCG PO TABS Oral Take 1 tablet by mouth at bedtime. AT 11PM    . OMEPRAZOLE 20 MG PO CPDR Oral Take 20 mg by mouth daily. 1 po 30 minutes before breakfast AND SUPPER    . VALACYCLOVIR HCL 1 G PO TABS Oral Take 1,000 mg by mouth at bedtime.       BP 117/67  Pulse 107  Temp(Src) 98.2 F (36.8 C) (Oral)  Resp 20  Ht 5\' 6"  (1.676 m)  Wt 303 lb (137.44 kg)  BMI 48.91 kg/m2  SpO2 99%  LMP 02/11/2012  Physical Exam 1330: Physical examination:  Nursing notes reviewed; Vital signs  and O2 SAT reviewed;  Constitutional: Well developed, Well nourished, Well hydrated, In no acute distress; Head:  Normocephalic, atraumatic; Eyes: EOMI, PERRL, No scleral icterus; ENMT: Mouth and pharynx normal, Mucous membranes moist; Neck: Supple, Full range of motion, No lymphadenopathy; Cardiovascular: Regular rate and rhythm, No murmur, rub, or gallop; Respiratory: Breath sounds clear & equal bilaterally, No rales, rhonchi, wheezes, or rub, Normal respiratory effort/excursion; Chest: Nontender, Movement normal; Abdomen: Soft, +TTP mid-epigastric and LUQ areas.  No rebound or guarding. Nondistended, Normal bowel sounds; Extremities: Pulses normal, No tenderness, No edema, No calf edema or asymmetry.;  Neuro: AA&Ox3, Major CN grossly intact.  No gross focal motor or sensory deficits in extremities.; Skin: Color normal, Warm, Dry, no rash.    ED Course  Procedures   MDM  MDM Reviewed: nursing note and vitals Interpretation: labs   Results for orders placed during the hospital encounter of 02/20/12  URINALYSIS, ROUTINE W REFLEX MICROSCOPIC      Component Value Range   Color, Urine YELLOW  YELLOW    APPearance HAZY (*) CLEAR    Specific Gravity, Urine <1.005 (*) 1.005 - 1.030    pH 6.0  5.0 - 8.0    Glucose, UA NEGATIVE  NEGATIVE (mg/dL)   Hgb urine dipstick LARGE (*) NEGATIVE    Bilirubin Urine NEGATIVE  NEGATIVE    Ketones, ur NEGATIVE  NEGATIVE (mg/dL)   Protein, ur 30 (*) NEGATIVE (mg/dL)   Urobilinogen, UA 0.2  0.0 - 1.0 (mg/dL)   Nitrite POSITIVE (*) NEGATIVE    Leukocytes, UA SMALL (*) NEGATIVE   PREGNANCY, URINE      Component Value Range   Preg Test, Ur NEGATIVE  NEGATIVE   URINE MICROSCOPIC-ADD ON      Component Value Range   Squamous Epithelial / LPF RARE  RARE    WBC, UA 21-50  <3 (WBC/hpf)   RBC / HPF 11-20  <3 (RBC/hpf)   Bacteria, UA FEW (*) RARE   CBC      Component Value Range   WBC 8.2  4.0 - 10.5 (K/uL)   RBC 4.46  3.87 - 5.11 (MIL/uL)   Hemoglobin 13.0  12.0 - 15.0 (g/dL)   HCT 16.1  09.6 - 04.5 (%)   MCV 86.5  78.0 - 100.0 (fL)   MCH 29.1  26.0 - 34.0 (pg)   MCHC 33.7  30.0 - 36.0 (g/dL)   RDW 40.9  81.1 - 91.4 (%)   Platelets 184  150 - 400 (K/uL)  DIFFERENTIAL      Component Value Range   Neutrophils Relative 74  43 - 77 (%)   Neutro Abs 6.0  1.7 - 7.7 (K/uL)   Lymphocytes Relative 17  12 - 46 (%)   Lymphs Abs 1.4  0.7 - 4.0 (K/uL)   Monocytes Relative 9  3 - 12 (%)   Monocytes Absolute 0.7  0.1 - 1.0 (K/uL)   Eosinophils Relative 0  0 - 5 (%)   Eosinophils Absolute 0.0  0.0 - 0.7 (K/uL)   Basophils Relative 0  0 - 1 (%)   Basophils Absolute 0.0  0.0 - 0.1 (K/uL)  COMPREHENSIVE METABOLIC PANEL      Component Value Range   Sodium 136   135 - 145 (mEq/L)   Potassium 3.4 (*) 3.5 - 5.1 (mEq/L)   Chloride 103  96 - 112 (mEq/L)   CO2 25  19 - 32 (mEq/L)   Glucose, Bld 91  70 - 99 (mg/dL)   BUN  9  6 - 23 (mg/dL)   Creatinine, Ser 9.60  0.50 - 1.10 (mg/dL)   Calcium 9.4  8.4 - 45.4 (mg/dL)   Total Protein 7.0  6.0 - 8.3 (g/dL)   Albumin 3.4 (*) 3.5 - 5.2 (g/dL)   AST 35  0 - 37 (U/L)   ALT 24  0 - 35 (U/L)   Alkaline Phosphatase 110  39 - 117 (U/L)   Total Bilirubin 0.6  0.3 - 1.2 (mg/dL)   GFR calc non Af Amer >90  >90 (mL/min)   GFR calc Af Amer >90  >90 (mL/min)  LIPASE, BLOOD      Component Value Range   Lipase 27  11 - 59 (U/L)     3:53 PM:  Pt has tol PO well while in ED without N/V.  Feels better and wants to go home now.  Dx testing d/w pt.  Questions answered.  Verb understanding, agreeable to d/c home with outpt f/u.        Laray Anger, DO 02/22/12 1200

## 2012-02-20 NOTE — Discharge Instructions (Signed)
RESOURCE GUIDE  Dental Problems  Patients with Medicaid: [facility]                     [facility] [address]                                           [address] Phone:  [phone]                                                  Phone:  [phone]  If unable to pay or uninsured, contact:  [facility] or [facility] to become qualified for the adult dental clinic.  Chronic Pain Problems Contact [facility]  [phone] Patients need to be referred by their primary care doctor.  Insufficient Money for Medicine Contact [employer]:  call "211" or [facility] [phone].  No Primary Care Doctor Call [facility]  [phone] Other agencies that provide inexpensive medical care    [facility]  [phone]    [facility]  [phone]    [facility]  [phone]    [facility] Clinic  [phone]    [facility]  [phone]    [facility]  [phone]  Psychological Services [facility]  [phone] [facility]  [phone] [facility]   [phone] (emergency services [phone])  Substance Abuse Resources [facility]  336-882-2125 [facility] 336-784-9470 [facility] 336-285-9073 [facility] 336-845-3988 Residential & Outpatient Substance Abuse Program  800-659-3381  Abuse/Neglect [place] Child Abuse Hotline (336) 641-3795 [place] Child Abuse Hotline 800-378-5315 (After Hours)  Emergency Shelter [facility] (336) 271-5985  Maternity Homes [facility] (336) 275-9566 [facility] (704) 372-4663  MRSA Hotline #:   832-7006    [place] [facility]  [facility] [place]     [employer]                          [facility] [address] [place]                       [address]      [address]  [place]                                                [place]                            [place] Phone:  [phone]                                   Phone:  [phone]                 Phone:  [phone]  [facility] Phone:  [phone]    [facility] Child Abuse Hotline 763 657 9981 (289)089-3138 (After Hours)   Take the prescriptions as directed.  Increase your fluid intake (ie:  Gatoraide) for the next few days, as discussed.  Eat a bland diet and advance to your regular diet slowly as you can tolerate it.  Call your regular medical doctor Monday to schedule a follow up appointment this week.  Return to the Emergency Department immediately if not improving (or even worsening) despite taking the medicines as prescribed, any black or bloody stool or vomit, if you develop a fever, or for any other concerns.

## 2012-02-20 NOTE — ED Notes (Signed)
Pt with abdominal pain, N/V since last night.

## 2012-02-20 NOTE — ED Notes (Signed)
Warm blanket provided to patient.

## 2012-03-07 ENCOUNTER — Emergency Department (HOSPITAL_COMMUNITY)
Admission: EM | Admit: 2012-03-07 | Discharge: 2012-03-08 | Disposition: A | Discharge status: Home / Self Care | Payer: Medicaid Other | Attending: Emergency Medicine | Admitting: Emergency Medicine

## 2012-03-07 ENCOUNTER — Encounter (HOSPITAL_COMMUNITY): Payer: Self-pay | Admitting: *Deleted

## 2012-03-07 DIAGNOSIS — R109 Unspecified abdominal pain: Secondary | ICD-10-CM | POA: Insufficient documentation

## 2012-03-07 DIAGNOSIS — Z79899 Other long term (current) drug therapy: Secondary | ICD-10-CM | POA: Insufficient documentation

## 2012-03-07 DIAGNOSIS — K219 Gastro-esophageal reflux disease without esophagitis: Secondary | ICD-10-CM | POA: Insufficient documentation

## 2012-03-07 DIAGNOSIS — K5289 Other specified noninfective gastroenteritis and colitis: Secondary | ICD-10-CM | POA: Insufficient documentation

## 2012-03-07 DIAGNOSIS — R197 Diarrhea, unspecified: Secondary | ICD-10-CM | POA: Insufficient documentation

## 2012-03-07 DIAGNOSIS — R112 Nausea with vomiting, unspecified: Secondary | ICD-10-CM | POA: Insufficient documentation

## 2012-03-07 DIAGNOSIS — K529 Noninfective gastroenteritis and colitis, unspecified: Secondary | ICD-10-CM

## 2012-03-07 DIAGNOSIS — N390 Urinary tract infection, site not specified: Secondary | ICD-10-CM | POA: Insufficient documentation

## 2012-03-07 MED ORDER — ONDANSETRON HCL 4 MG/2ML IJ SOLN
4.0000 mg | Freq: Once | INTRAMUSCULAR | Status: AC
  Administered 2012-03-08: 4 mg via INTRAVENOUS
  Filled 2012-03-07: qty 2

## 2012-03-07 MED ORDER — SODIUM CHLORIDE 0.9 % IV BOLUS (SEPSIS)
1000.0000 mL | Freq: Once | INTRAVENOUS | Status: AC
  Administered 2012-03-08: 1000 mL via INTRAVENOUS

## 2012-03-07 MED ORDER — HYOSCYAMINE SULFATE 0.125 MG PO TABS
0.2500 mg | ORAL_TABLET | Freq: Once | ORAL | Status: AC
  Administered 2012-03-08: 0.25 mg via ORAL
  Filled 2012-03-07: qty 2

## 2012-03-07 NOTE — ED Notes (Signed)
Pt reporting n/v/d began today about 7 am.  Reports several episodes of diarrhea. Reporting pain in middle of abdomen, radiating to back.   No distress noted at this time.

## 2012-03-07 NOTE — ED Notes (Signed)
abd pain , nvd onset today

## 2012-03-08 LAB — COMPREHENSIVE METABOLIC PANEL
Alkaline Phosphatase: 94 U/L (ref 39–117)
BUN: 10 mg/dL (ref 6–23)
Calcium: 9.6 mg/dL (ref 8.4–10.5)
Creatinine, Ser: 0.71 mg/dL (ref 0.50–1.10)
GFR calc Af Amer: >90 mL/min (ref >90)
Glucose, Bld: 89 mg/dL (ref 70–99)
Total Protein: 7.3 g/dL (ref 6.0–8.3)

## 2012-03-08 LAB — DIFFERENTIAL
Basophils Relative: 0 % (ref 0–1)
Eosinophils Absolute: 0.1 10*3/uL (ref 0.0–0.7)
Eosinophils Relative: 1 % (ref 0–5)
Lymphs Abs: 1.9 10*3/uL (ref 0.7–4.0)
Monocytes Absolute: 0.5 10*3/uL (ref 0.1–1.0)
Monocytes Relative: 9 % (ref 3–12)

## 2012-03-08 LAB — URINALYSIS, ROUTINE W REFLEX MICROSCOPIC
Ketones, ur: NEGATIVE mg/dL
Nitrite: NEGATIVE
Specific Gravity, Urine: >1.03 — ABNORMAL HIGH (ref 1.005–1.030)
pH: 5.5 (ref 5.0–8.0)

## 2012-03-08 LAB — URINE MICROSCOPIC-ADD ON

## 2012-03-08 LAB — CBC
Hemoglobin: 14 g/dL (ref 12.0–15.0)
MCH: 30.2 pg (ref 26.0–34.0)
MCHC: 34.6 g/dL (ref 30.0–36.0)
MCV: 87.5 fL (ref 78.0–100.0)
RBC: 4.63 MIL/uL (ref 3.87–5.11)

## 2012-03-08 LAB — POCT PREGNANCY, URINE: Preg Test, Ur: NEGATIVE

## 2012-03-08 MED ORDER — SULFAMETHOXAZOLE-TMP DS 800-160 MG PO TABS
1.0000 | ORAL_TABLET | Freq: Once | ORAL | Status: AC
  Administered 2012-03-08: 1 via ORAL
  Filled 2012-03-08: qty 1

## 2012-03-08 MED ORDER — SULFAMETHOXAZOLE-TRIMETHOPRIM 800-160 MG PO TABS: 1.0000 | ORAL_TABLET | Freq: Two times a day (BID) | ORAL | Status: AC

## 2012-03-08 MED ORDER — ONDANSETRON HCL 4 MG PO TABS: 4.0000 mg | ORAL_TABLET | Freq: Three times a day (TID) | ORAL | Status: AC | PRN

## 2012-03-08 NOTE — ED Provider Notes (Signed)
History     CSN: [id number]  Arrival date & time 03/07/12  4098   First MD Initiated Contact with Patient 03/07/12 2336      Chief Complaint  Patient presents with  . Abdominal Pain    (Consider location/radiation/quality/duration/timing/severity/associated sxs/prior treatment) HPI Comments: [name] presents with a 16 hour history of nausea vomiting and diarrhea and mid abdominal cramping.  Symptoms started shortly after waking this morning.  She reports frequent episodes of watery stools, stating the last episode occurred 5 minutes prior to entering the exam room.  She has had 8-10 episodes of vomiting today, mostly food or bilious liquid.  She denies fevers or chills, no diaphoresis.  She has found no alleviating factors, eating food makes her diarrhea worse.  The history is provided by the patient.    Past Medical History  Diagnosis Date  . Herpes   . GERD (gastroesophageal reflux disease)     Past Surgical History  Procedure Date  . Wisdom tooth extraction   . Esophagogastroduodenoscopy 01/19/2012    Procedure: ESOPHAGOGASTRODUODENOSCOPY (EGD);  Surgeon: West Bali, MD;  Location: [facility];  Service: Endoscopy;  Laterality: N/A;  1:30    Family History  Problem Relation Age of Onset  . Diabetes Mother   . Diabetes Other   . Hypertension Other     History  Substance Use Topics  . Smoking status: Former Smoker -- 3 years    Types: Cigarettes    Quit date: 07/28/2011  . Smokeless tobacco: Never Used  . Alcohol Use: No    OB History    Grav Para Term Preterm Abortions TAB SAB Ect Mult Living   1 1 1       1       Review of Systems  Constitutional: Negative for fever.  HENT: Negative for congestion, sore throat and neck pain.   Eyes: Negative.   Respiratory: Negative for chest tightness and shortness of breath.   Cardiovascular: Negative for chest pain.  Gastrointestinal: Positive for nausea, vomiting, abdominal pain and diarrhea.    Genitourinary: Negative.   Musculoskeletal: Negative for joint swelling and arthralgias.  Skin: Negative.  Negative for rash and wound.  Neurological: Negative for dizziness, weakness, light-headedness, numbness and headaches.  Hematological: Negative.   Psychiatric/Behavioral: Negative.     Allergies  Review of patient's allergies indicates no known allergies.  Home Medications   Current Outpatient Rx  Name Route Sig Dispense Refill  . CITALOPRAM HYDROBROMIDE 20 MG PO TABS Oral Take 20 mg by mouth every morning.    Marland Kitchen CLONAZEPAM 0.5 MG PO TBDP Oral Take 0.5 mg by mouth 2 (two) times daily.    Marland Kitchen NORGESTIMATE-ETH ESTRADIOL 0.25-35 MG-MCG PO TABS Oral Take 1 tablet by mouth at bedtime. AT 11PM    . OMEPRAZOLE 20 MG PO CPDR Oral Take 20 mg by mouth 2 (two) times daily. 1 po 30 minutes before breakfast AND SUPPER    . TRAZODONE HCL 100 MG PO TABS Oral Take 100 mg by mouth at bedtime.    Marland Kitchen VALACYCLOVIR HCL 1 G PO TABS Oral Take 1,000 mg by mouth at bedtime.     Marland Kitchen ONDANSETRON HCL 4 MG PO TABS Oral Take 1 tablet (4 mg total) by mouth every 8 (eight) hours as needed for nausea. 12 tablet 0  . SULFAMETHOXAZOLE-TRIMETHOPRIM 800-160 MG PO TABS Oral Take 1 tablet by mouth 2 (two) times daily. 6 tablet 0    BP 117/59  Pulse 83  Temp(Src)  97.7 F (36.5 C) (Oral)  Resp 20  Ht 5\' 6"  (1.676 m)  Wt 303 lb (137.44 kg)  BMI 48.91 kg/m2  SpO2 100%  LMP 02/11/2012  Physical Exam  Nursing note and vitals reviewed. Constitutional: No distress.       Morbidly obese.  HENT:  Head: Normocephalic and atraumatic.  Eyes: Conjunctivae are normal.  Neck: Normal range of motion.  Cardiovascular: Normal rate, regular rhythm, normal heart sounds and intact distal pulses.   Pulmonary/Chest: Effort normal and breath sounds normal. She has no wheezes.  Abdominal: Soft. Bowel sounds are normal. She exhibits no mass. There is no tenderness. There is no rigidity, no rebound, no guarding, no CVA tenderness, no  tenderness at McBurney's point and negative Murphy's sign.       Morbidly obese abdomen, benign.  Musculoskeletal: Normal range of motion.  Neurological: She is alert.  Skin: Skin is warm and dry. She is not diaphoretic.  Psychiatric: She has a normal mood and affect.    ED Course  Procedures (including critical care time)  Labs Reviewed  COMPREHENSIVE METABOLIC PANEL - Abnormal; Notable for the following:    AST 38 (*)    Total Bilirubin 0.2 (*)    All other components within normal limits  URINALYSIS, ROUTINE W REFLEX MICROSCOPIC - Abnormal; Notable for the following:    APPearance HAZY (*)    Specific Gravity, Urine >1.030 (*)    Hgb urine dipstick LARGE (*)    Protein, ur TRACE (*)    Leukocytes, UA MODERATE (*)    All other components within normal limits  URINE MICROSCOPIC-ADD ON - Abnormal; Notable for the following:    Squamous Epithelial / LPF MANY (*)    Bacteria, UA MANY (*)    All other components within normal limits  CBC  DIFFERENTIAL  POCT PREGNANCY, URINE  URINE CULTURE   No results found.   1. Gastroenteritis   2. UTI (lower urinary tract infection)     1 L of normal saline per IV given, Zofran IV and hyoscyamine 0.25 mg by mouth given.  Patient had no further emesis while in exam room, no diarrhea.  MDM  Patient presents with gastroenteritis symptoms and labs are essentially unremarkable except for continued UTI despite antibiotic treatment instituted on April eighth, a one-week treatment with Keflex.  Urine culture requested.  Will treat with a three-day course of Bactrim.  Zofran given for nausea and vomiting.  Patient encouraged to followup with PCP for recheck if symptoms are not improving over the next 48 hours.        Candis Musa, PA 03/08/12 0139

## 2012-03-08 NOTE — Discharge Instructions (Signed)
Urinary Tract Infection Infections of the urinary tract can start in several places. A bladder infection (cystitis), a kidney infection (pyelonephritis), and a prostate infection (prostatitis) are different types of urinary tract infections (UTIs). They usually get better if treated with medicines (antibiotics) that kill germs. Take all the medicine until it is gone. You or your child may feel better in a few days, but TAKE ALL MEDICINE or the infection may not respond and may become more difficult to treat. HOME CARE INSTRUCTIONS   Drink enough water and fluids to keep the urine clear or pale yellow. Cranberry juice is especially recommended, in addition to large amounts of water.   Avoid caffeine, tea, and carbonated beverages. They tend to irritate the bladder.   Alcohol may irritate the prostate.   Only take over-the-counter or prescription medicines for pain, discomfort, or fever as directed by your caregiver.  To prevent further infections:  Empty the bladder often. Avoid holding urine for long periods of time.   After a bowel movement, women should cleanse from front to back. Use each tissue only once.   Empty the bladder before and after sexual intercourse.  FINDING OUT THE RESULTS OF YOUR TEST Not all test results are available during your visit. If your or your child's test results are not back during the visit, make an appointment with your caregiver to find out the results. Do not assume everything is normal if you have not heard from your caregiver or the medical facility. It is important for you to follow up on all test results. SEEK MEDICAL CARE IF:   There is back pain.   Your baby is older than 3 months with a rectal temperature of 100.5 F (38.1 C) or higher for more than 1 day.   Your or your child's problems (symptoms) are no better in 3 days. Return sooner if you or your child is getting worse.  SEEK IMMEDIATE MEDICAL CARE IF:   There is severe back pain or lower  abdominal pain.   You or your child develops chills.   You have a fever.   Your baby is older than 3 months with a rectal temperature of 102 F (38.9 C) or higher.   Your baby is 66 months old or younger with a rectal temperature of 100.4 F (38 C) or higher.   There is nausea or vomiting.   There is continued burning or discomfort with urination.  MAKE SURE YOU:   Understand these instructions.   Will watch your condition.   Will get help right away if you are not doing well or get worse.  Document Released: 08/12/2005 Document Revised: 10/22/2011 Document Reviewed: 03/17/2007 [facility] Patient Information 2012 [place], [place].Diet for Diarrhea, Adult Having frequent, runny stools (diarrhea) has many causes. Diarrhea may be caused or worsened by food or drink. Diarrhea may be relieved by changing your diet. IF YOU ARE NOT TOLERATING SOLID FOODS:  Drink enough water and fluids to keep your urine clear or pale yellow.   Avoid sugary drinks and sodas as well as milk-based beverages.   Avoid beverages containing caffeine and alcohol.   You may try rehydrating beverages. You can make your own by following this recipe:    tsp table salt.    tsp baking soda.   ? tsp salt substitute (potassium chloride).   1 tbs + 1 tsp sugar.   1 qt water.  As your stools become more solid, you can start eating solid foods. Add foods one at  a time. If a certain food causes your diarrhea to get worse, avoid that food and try other foods. A low fiber, low-fat, and lactose-free diet is recommended. Small, frequent meals may be better tolerated.  Starches  Allowed:  White, [place], and pita breads, plain rolls, buns, bagels. Plain muffins, matzo. Soda, saltine, or graham crackers. Pretzels, melba toast, zwieback. Cooked cereals made with water: cornmeal, farina, cream cereals. Dry cereals: refined corn, wheat, rice. Potatoes prepared any way without skins, refined macaroni, spaghetti, noodles,  refined rice.   Avoid:  Bread, rolls, or crackers made with whole wheat, multi-grains, rye, bran seeds, nuts, or coconut. Corn tortillas or taco shells. Cereals containing whole grains, multi-grains, bran, coconut, nuts, or raisins. Cooked or dry oatmeal. Coarse wheat cereals, granola. Cereals advertised as "high-fiber." Potato skins. Whole grain pasta, wild or brown rice. Popcorn. Sweet potatoes/yams. Sweet rolls, doughnuts, waffles, pancakes, sweet breads.  Vegetables  Allowed: Strained tomato and vegetable juices. Most well-cooked and canned vegetables without seeds. Fresh: Tender lettuce, cucumber without the skin, cabbage, spinach, bean sprouts.   Avoid: Fresh, cooked, or canned: Artichokes, baked beans, beet greens, broccoli, Brussels sprouts, corn, kale, legumes, peas, sweet potatoes. Cooked: Green or red cabbage, spinach. Avoid large servings of any vegetables, because vegetables shrink when cooked, and they contain more fiber per serving than fresh vegetables.  Fruit  Allowed: All fruit juices except prune juice. Cooked or canned: Apricots, applesauce, cantaloupe, cherries, fruit cocktail, grapefruit, grapes, kiwi, mandarin oranges, peaches, pears, plums, watermelon. Fresh: Apples without skin, ripe banana, grapes, cantaloupe, cherries, grapefruit, peaches, oranges, plums. Keep servings limited to  cup or 1 piece.   Avoid: Fresh: Apple with skin, apricots, mango, pears, raspberries, strawberries. Prune juice, stewed or dried prunes. Dried fruits, raisins, dates. Large servings of all fresh fruits.  Meat and Meat Substitutes  Allowed: Ground or well-cooked tender beef, ham, veal, lamb, pork, or poultry. Eggs, plain cheese. Fish, oysters, shrimp, lobster, other seafoods. Liver, organ meats.   Avoid: Tough, fibrous meats with gristle. Peanut butter, smooth or chunky. Cheese, nuts, seeds, legumes, dried peas, beans, lentils.  Milk  Allowed: Yogurt, lactose-free milk, kefir, drinkable  yogurt, buttermilk, soy milk.   Avoid: Milk, chocolate milk, beverages made with milk, such as milk shakes.  Soups  Allowed: Bouillon, broth, or soups made from allowed foods. Any strained soup.   Avoid: Soups made from vegetables that are not allowed, cream or milk-based soups.  Desserts and Sweets  Allowed: Sugar-free gelatin, sugar-free frozen ice pops made without sugar alcohol.   Avoid: Plain cakes and cookies, pie made with allowed fruit, pudding, custard, cream pie. Gelatin, fruit, ice, sherbet, frozen ice pops. Ice cream, ice milk without nuts. Plain hard candy, honey, jelly, molasses, syrup, sugar, chocolate syrup, gumdrops, marshmallows.  Fats and Oils  Allowed: Avoid any fats and oils.   Avoid: Seeds, nuts, olives, avocados. Margarine, butter, cream, mayonnaise, salad oils, plain salad dressings made from allowed foods. Plain gravy, crisp bacon without rind.  Beverages  Allowed: Water, decaffeinated teas, oral rehydration solutions, sugar-free beverages.   Avoid: Fruit juices, caffeinated beverages (coffee, tea, soda or pop), alcohol, sports drinks, or lemon-lime soda or pop.  Condiments  Allowed: Ketchup, mustard, horseradish, vinegar, cream sauce, cheese sauce, cocoa powder. Spices in moderation: allspice, basil, bay leaves, celery powder or leaves, cinnamon, cumin powder, curry powder, ginger, mace, marjoram, onion or garlic powder, oregano, paprika, parsley flakes, ground pepper, rosemary, sage, savory, tarragon, thyme, turmeric.   Avoid: Coconut, honey.  Weight Monitoring: Weigh  yourself every day. You should weigh yourself in the morning after you urinate and before you eat breakfast. Wear the same amount of clothing when you weigh yourself. Record your weight daily. Bring your recorded weights to your clinic visits. Tell your caregiver right away if you have gained 3 lb/1.4 kg or more in 1 day, 5 lb/2.3 kg in a week, or whatever amount you were told to report. SEEK  IMMEDIATE MEDICAL CARE IF:   You are unable to keep fluids down.   You start to throw up (vomit) or diarrhea keeps coming back (persistent).   Abdominal pain develops, increases, or can be felt in one place (localizes).   You have an oral temperature above 102 F (38.9 C), not controlled by medicine.   Diarrhea contains blood or mucus.   You develop excessive weakness, dizziness, fainting, or extreme thirst.  MAKE SURE YOU:   Understand these instructions.   Will watch your condition.   Will get help right away if you are not doing well or get worse.  Document Released: 01/23/2004 Document Revised: 10/22/2011 Document Reviewed: 05/16/2009 [facility] Patient Information 2012 [place], [place].  Use the medicines prescribed for your urinary infection and nausea.  Issue drinking plenty of fluids.  Refer to the instructions to help with your diarrhea symptoms.  Please get rechecked by Dr. Megan Mans if your symptoms are not improving over the next several days.  You should have a repeat urinalysis performed next week to make sure your infection is better please call your physician's office for a lab visit for this test.

## 2012-03-08 NOTE — ED Provider Notes (Signed)
Medical screening examination/treatment/procedure(s) were conducted as a shared visit with non-physician practitioner(s) and myself.  I personally evaluated the patient during the encounter   [name], MD 03/08/12 0630

## 2012-03-09 LAB — URINE CULTURE: Culture: NO GROWTH

## 2012-03-18 ENCOUNTER — Emergency Department (HOSPITAL_COMMUNITY): Payer: Medicaid Other

## 2012-03-18 ENCOUNTER — Emergency Department (HOSPITAL_COMMUNITY)
Admission: EM | Admit: 2012-03-18 | Discharge: 2012-03-18 | Disposition: A | Discharge status: Home / Self Care | Payer: Medicaid Other | Attending: Emergency Medicine | Admitting: Emergency Medicine

## 2012-03-18 ENCOUNTER — Encounter (HOSPITAL_COMMUNITY): Payer: Self-pay | Admitting: *Deleted

## 2012-03-18 DIAGNOSIS — K219 Gastro-esophageal reflux disease without esophagitis: Secondary | ICD-10-CM | POA: Insufficient documentation

## 2012-03-18 DIAGNOSIS — N644 Mastodynia: Secondary | ICD-10-CM | POA: Insufficient documentation

## 2012-03-18 DIAGNOSIS — Z79899 Other long term (current) drug therapy: Secondary | ICD-10-CM | POA: Insufficient documentation

## 2012-03-18 DIAGNOSIS — R209 Unspecified disturbances of skin sensation: Secondary | ICD-10-CM | POA: Insufficient documentation

## 2012-03-18 DIAGNOSIS — R071 Chest pain on breathing: Secondary | ICD-10-CM | POA: Insufficient documentation

## 2012-03-18 HISTORY — DX: Gastritis, unspecified, without bleeding: K29.70

## 2012-03-18 MED ORDER — HYDROMORPHONE HCL PF 2 MG/ML IJ SOLN
2.0000 mg | Freq: Once | INTRAMUSCULAR | Status: AC
  Administered 2012-03-18: 2 mg via INTRAMUSCULAR
  Filled 2012-03-18: qty 1

## 2012-03-18 MED ORDER — HYDROCODONE-ACETAMINOPHEN 5-325 MG PO TABS: 1.0000 | ORAL_TABLET | Freq: Four times a day (QID) | ORAL | Status: AC | PRN

## 2012-03-18 MED ORDER — NAPROXEN 500 MG PO TABS: 500.0000 mg | ORAL_TABLET | Freq: Two times a day (BID) | ORAL | Status: DC

## 2012-03-18 NOTE — Discharge Instructions (Signed)
Follow up with your md next week. °

## 2012-03-18 NOTE — ED Notes (Signed)
Numbness of lt breast, and pain at nipple. No injury, no d/c from nipple.

## 2012-03-18 NOTE — ED Provider Notes (Signed)
History     CSN: [id number]  Arrival date & time 03/18/12  0865   First MD Initiated Contact with Patient 03/18/12 1957      Chief Complaint  Patient presents with  . Numbness    (Consider location/radiation/quality/duration/timing/severity/associated sxs/prior treatment) Patient is a 26 y.o. female presenting with chest pain. The history is provided by the patient (the pt complains of chest pain on the left with pain in her left berast). No language interpreter was used.  Chest Pain The chest pain began 2 days ago. Chest pain occurs constantly. The chest pain is unchanged. The pain is associated with lifting. At its most intense, the pain is at 6/10. The pain is currently at 4/10. The severity of the pain is severe. The quality of the pain is described as aching. The pain does not radiate. Exacerbated by: movement. Pertinent negatives for primary symptoms include no fever, no fatigue, no cough, no wheezing and no abdominal pain.  Pertinent negatives for associated symptoms include no claudication. She tried nothing for the symptoms. Risk factors include no known risk factors.  Pertinent negatives for past medical history include no aneurysm and no seizures.     Past Medical History  Diagnosis Date  . Herpes   . GERD (gastroesophageal reflux disease)   . Gastritis     Past Surgical History  Procedure Date  . Wisdom tooth extraction   . Esophagogastroduodenoscopy 01/19/2012    Procedure: ESOPHAGOGASTRODUODENOSCOPY (EGD);  Surgeon: West Bali, MD;  Location: [facility];  Service: Endoscopy;  Laterality: N/A;  1:30  . Bulging disc in back     Family History  Problem Relation Age of Onset  . Diabetes Mother   . Diabetes Other   . Hypertension Other     History  Substance Use Topics  . Smoking status: Former Smoker -- 3 years    Types: Cigarettes    Quit date: 07/28/2011  . Smokeless tobacco: Never Used  . Alcohol Use: No    OB History    Grav Para Term Preterm  Abortions TAB SAB Ect Mult Living   1 1 1       1       Review of Systems  Constitutional: Negative for fever and fatigue.  HENT: Negative for congestion, sinus pressure and ear discharge.   Eyes: Negative for discharge.  Respiratory: Negative for cough and wheezing.   Cardiovascular: Positive for chest pain. Negative for claudication.  Gastrointestinal: Negative for abdominal pain and diarrhea.  Genitourinary: Negative for frequency and hematuria.  Musculoskeletal: Negative for back pain.  Skin: Negative for rash.  Neurological: Negative for seizures and headaches.  Hematological: Negative.   Psychiatric/Behavioral: Negative for hallucinations.    Allergies  Review of patient's allergies indicates no known allergies.  Home Medications   Current Outpatient Rx  Name Route Sig Dispense Refill  . CITALOPRAM HYDROBROMIDE 20 MG PO TABS Oral Take 20 mg by mouth every morning.    Marland Kitchen CLONAZEPAM 0.5 MG PO TBDP Oral Take 0.5 mg by mouth 2 (two) times daily.    Marland Kitchen HYDROCODONE-ACETAMINOPHEN 5-325 MG PO TABS Oral Take 1 tablet by mouth every 6 (six) hours as needed for pain. 20 tablet 0  . NAPROXEN 500 MG PO TABS Oral Take 1 tablet (500 mg total) by mouth 2 (two) times daily. 15 tablet 0  . NORGESTIMATE-ETH ESTRADIOL 0.25-35 MG-MCG PO TABS Oral Take 1 tablet by mouth at bedtime. AT 11PM    . OMEPRAZOLE 20 MG PO CPDR  Oral Take 20 mg by mouth 2 (two) times daily. 1 po 30 minutes before breakfast AND SUPPER    . SULFAMETHOXAZOLE-TRIMETHOPRIM 800-160 MG PO TABS Oral Take 1 tablet by mouth 2 (two) times daily. 6 tablet 0  . TRAZODONE HCL 100 MG PO TABS Oral Take 100 mg by mouth at bedtime.    Marland Kitchen VALACYCLOVIR HCL 1 G PO TABS Oral Take 1,000 mg by mouth at bedtime.       BP 123/66  Pulse 106  Temp 97.3 F (36.3 C)  Resp 20  Ht 5\' 6"  (1.676 m)  Wt 296 lb (134.265 kg)  BMI 47.78 kg/m2  SpO2 100%  LMP 02/29/2012  Physical Exam  Constitutional: She is oriented to person, place, and time. She  appears well-developed.  HENT:  Head: Normocephalic and atraumatic.  Eyes: Conjunctivae and EOM are normal. No scleral icterus.  Neck: Neck supple. No thyromegaly present.  Cardiovascular: Normal rate and regular rhythm.  Exam reveals no gallop and no friction rub.   No murmur heard. Pulmonary/Chest: No stridor. She has no wheezes. She has no rales. She exhibits tenderness.       Tender left ant chest and left breast,  Rest of breast exam normal  Abdominal: She exhibits no distension. There is no tenderness. There is no rebound.  Musculoskeletal: Normal range of motion. She exhibits no edema.  Lymphadenopathy:    She has no cervical adenopathy.  Neurological: She is oriented to person, place, and time. Coordination normal.  Skin: No rash noted. No erythema.  Psychiatric: She has a normal mood and affect. Her behavior is normal.    ED Course  Procedures (including critical care time)  Labs Reviewed - No data to display Dg Chest 2 View  03/18/2012  *RADIOLOGY REPORT*  Clinical Data: Left breast pain and left-sided chest pain.  CHEST - 2 VIEW 03/18/2012:  Comparison: Two-view chest x-ray 01/03/2012, 05/26/2011, and portable chest x-ray 03/10/2010.  Findings: Cardiomediastinal silhouette unremarkable.  Lungs clear. Bronchovascular markings normal.  Pulmonary vascularity normal.  No pleural effusions.  No pneumothorax.  Visualized bony thorax intact.  No significant interval change.  IMPRESSION: Normal and stable examination.  Original Report Authenticated By: Arnell Sieving, M.D.     1. Chest pain       MDM          [name], MD 03/18/12 [phone]

## 2012-03-23 ENCOUNTER — Other Ambulatory Visit (HOSPITAL_COMMUNITY): Payer: Self-pay | Admitting: Family Medicine

## 2012-03-23 ENCOUNTER — Ambulatory Visit (HOSPITAL_COMMUNITY)
Admission: RE | Admit: 2012-03-23 | Discharge: 2012-03-23 | Disposition: A | Discharge status: Home / Self Care | Payer: Medicaid Other | Source: Ambulatory Visit | Attending: Family Medicine | Admitting: Family Medicine

## 2012-03-23 DIAGNOSIS — M545 Low back pain, unspecified: Secondary | ICD-10-CM | POA: Insufficient documentation

## 2012-03-23 DIAGNOSIS — R209 Unspecified disturbances of skin sensation: Secondary | ICD-10-CM | POA: Insufficient documentation

## 2012-03-28 ENCOUNTER — Other Ambulatory Visit (HOSPITAL_COMMUNITY): Payer: Self-pay | Admitting: Family Medicine

## 2012-03-28 DIAGNOSIS — R52 Pain, unspecified: Secondary | ICD-10-CM

## 2012-03-31 ENCOUNTER — Ambulatory Visit (HOSPITAL_COMMUNITY)
Admission: RE | Admit: 2012-03-31 | Discharge: 2012-03-31 | Disposition: A | Discharge status: Home / Self Care | Payer: Medicaid Other | Source: Ambulatory Visit | Attending: Family Medicine | Admitting: Family Medicine

## 2012-03-31 DIAGNOSIS — M79609 Pain in unspecified limb: Secondary | ICD-10-CM | POA: Insufficient documentation

## 2012-03-31 DIAGNOSIS — M545 Low back pain, unspecified: Secondary | ICD-10-CM | POA: Insufficient documentation

## 2012-03-31 DIAGNOSIS — R209 Unspecified disturbances of skin sensation: Secondary | ICD-10-CM | POA: Insufficient documentation

## 2012-03-31 DIAGNOSIS — R52 Pain, unspecified: Secondary | ICD-10-CM

## 2012-04-05 ENCOUNTER — Other Ambulatory Visit: Payer: Self-pay | Admitting: Family Medicine

## 2012-04-05 DIAGNOSIS — M545 Low back pain: Secondary | ICD-10-CM

## 2012-04-17 ENCOUNTER — Emergency Department (HOSPITAL_COMMUNITY)
Admission: EM | Admit: 2012-04-17 | Discharge: 2012-04-17 | Disposition: A | Discharge status: Home / Self Care | Payer: Medicaid Other | Attending: Emergency Medicine | Admitting: Emergency Medicine

## 2012-04-17 ENCOUNTER — Emergency Department (HOSPITAL_COMMUNITY): Payer: Medicaid Other

## 2012-04-17 ENCOUNTER — Encounter (HOSPITAL_COMMUNITY): Payer: Self-pay | Admitting: *Deleted

## 2012-04-17 DIAGNOSIS — M79606 Pain in leg, unspecified: Secondary | ICD-10-CM

## 2012-04-17 DIAGNOSIS — M7989 Other specified soft tissue disorders: Secondary | ICD-10-CM | POA: Insufficient documentation

## 2012-04-17 DIAGNOSIS — M79609 Pain in unspecified limb: Secondary | ICD-10-CM | POA: Insufficient documentation

## 2012-04-17 LAB — URINALYSIS, ROUTINE W REFLEX MICROSCOPIC
Bilirubin Urine: NEGATIVE
Nitrite: NEGATIVE
Protein, ur: NEGATIVE mg/dL
Urobilinogen, UA: 0.2 mg/dL (ref 0.0–1.0)

## 2012-04-17 LAB — URINE MICROSCOPIC-ADD ON

## 2012-04-17 MED ORDER — HYDROCODONE-ACETAMINOPHEN 7.5-325 MG PO TABS: 1.0000 | ORAL_TABLET | ORAL | Status: AC | PRN

## 2012-04-17 MED ORDER — ENOXAPARIN SODIUM 60 MG/0.6ML ~~LOC~~ SOLN
135.0000 mg | Freq: Once | SUBCUTANEOUS | Status: AC
  Administered 2012-04-17: 135 mg via SUBCUTANEOUS
  Filled 2012-04-17: qty 0.6

## 2012-04-17 MED ORDER — OXYCODONE-ACETAMINOPHEN 5-325 MG PO TABS
1.0000 | ORAL_TABLET | Freq: Once | ORAL | Status: AC
  Administered 2012-04-17: 1 via ORAL
  Filled 2012-04-17: qty 1

## 2012-04-17 NOTE — ED Notes (Signed)
Pt presents with rt leg pain, and swelling since this morning. Pt denies injury/trauma. No visible swelling noted. Pedal and femoral pulses equal bilaterally. Negative homans' sign.

## 2012-04-17 NOTE — Discharge Instructions (Signed)
Sacroiliac Joint Dysfunction The sacroiliac joint connects the lower part of the spine (the sacrum) with the bones of the pelvis. CAUSES  Sometimes, there is no obvious reason for sacroiliac joint dysfunction. Other times, it may occur   During pregnancy.   After injury, such as:   Car accidents.   Sport-related injuries.   Work-related injuries.   Due to one leg being shorter than the other.   Due to other conditions that affect the joints, such as:   Rheumatoid arthritis.   Gout.   Psoriasis.   Joint infection (septic arthritis).  SYMPTOMS  Symptoms may include:  Pain in the:   Lower back.   Buttocks.   Groin.   Thighs and legs.   Difficult sitting, standing, walking, lying, bending or lifting.  DIAGNOSIS  A number of tests may be used to help diagnose the cause of sacroiliac joint dysfunction, including:  Imaging tests to look for other causes of pain, including:   MRI.   CT scan.   Bone scan.   Diagnostic injection: During a special x-ray (called fluoroscopy), a needle is put into the sacroiliac joint. A numbing medicine is injected into the joint. If the pain is improved or stopped, the diagnosis of sacroiliac joint dysfunction is more likely.  TREATMENT  There are a number of types of treatment used for sacroiliac joint dysfunction, including:  Only take over-the-counter or prescription medicines for pain, discomfort, or fever as directed by your caregiver.   Medications to relax muscles.   Rest. Decreasing activity can help cut down on painful muscle spasms and allow the back to heal.   Application of heat or ice to the lower back may improve muscle spasms and soothe pain.   Brace. A special back brace, called a sacroiliac belt, can help support the joint while your back is healing.   Physical therapy can help teach comfortable positions and exercises to strengthen muscles that support the sacroiliac joint.   Cortisone injections. Injections  of steroid medicine into the joint can help decrease swelling and improve pain.   Hyaluronic acid injections. This chemical improves lubrication within the sacroiliac joint, thereby decreasing pain.   Radiofrequency ablation. A special needle is placed into the joint, where it burns away nerves that are carrying pain messages from the joint.   Surgery. Because pain occurs during movement of the joint, screws and plates may be installed in order to limit or prevent joint motion.  HOME CARE INSTRUCTIONS   Take all medications exactly as directed.   Follow instructions regarding both rest and physical activity, to avoid worsening the pain.   Do physical therapy exercises exactly as prescribed.  SEEK IMMEDIATE MEDICAL CARE IF:  You experience increasingly severe pain.   You develop new symptoms, such as numbness or tingling in your legs or feet.   You lose bladder or bowel control.  Document Released: 01/29/2009 Document Revised: 10/22/2011 Document Reviewed: 01/29/2009 [employer] Patient Information 2012 [employer]. 

## 2012-04-17 NOTE — ED Provider Notes (Signed)
History     CSN: [id number]  Arrival date & time 04/17/12  1533   First MD Initiated Contact with Patient 04/17/12 1614      Chief Complaint  Patient presents with  . Leg Swelling    (Consider location/radiation/quality/duration/timing/severity/associated sxs/prior treatment) HPI Comments: Patient c/o pain and swelling to her right thigh for several days.  States the pain is worse with weight bearing and improves somewhat with rest.  She denies known injury.   She also denies numbness or tingling of the leg.    Patient is a 26 y.o. female presenting with leg pain. The history is provided by the patient.  Leg Pain  The incident occurred more than 2 days ago. There was no injury mechanism. The pain is present in the right thigh and right leg. The quality of the pain is described as throbbing and aching. The pain is moderate. The pain has been constant since onset. Pertinent negatives include no numbness, no inability to bear weight, no loss of motion, no muscle weakness, no loss of sensation and no tingling. Associated symptoms comments: swelling of her thigh. She reports no foreign bodies present. The symptoms are aggravated by activity, bearing weight and palpation. She has tried nothing for the symptoms. The treatment provided no relief.    Past Medical History  Diagnosis Date  . Herpes   . GERD (gastroesophageal reflux disease)   . Gastritis     Past Surgical History  Procedure Date  . Wisdom tooth extraction   . Esophagogastroduodenoscopy 01/19/2012    Procedure: ESOPHAGOGASTRODUODENOSCOPY (EGD);  Surgeon: West Bali, MD;  Location: [facility];  Service: Endoscopy;  Laterality: N/A;  1:30  . Bulging disc in back     Family History  Problem Relation Age of Onset  . Diabetes Mother   . Diabetes Other   . Hypertension Other     History  Substance Use Topics  . Smoking status: Former Smoker -- 3 years    Types: Cigarettes    Quit date: 07/28/2011  . Smokeless  tobacco: Never Used  . Alcohol Use: No    OB History    Grav Para Term Preterm Abortions TAB SAB Ect Mult Living   1 1 1       1       Review of Systems  Constitutional: Negative for fever.  Respiratory: Negative for chest tightness and shortness of breath.   Cardiovascular: Negative for chest pain.  Gastrointestinal: Negative for vomiting and abdominal pain.  Genitourinary: Negative for hematuria, flank pain and difficulty urinating.  Musculoskeletal: Positive for myalgias and joint swelling. Negative for back pain.  Skin: Negative for color change, rash and wound.  Neurological: Negative for tingling, weakness and numbness.  All other systems reviewed and are negative.    Allergies  Review of patient's allergies indicates no known allergies.  Home Medications   Current Outpatient Rx  Name Route Sig Dispense Refill  . CITALOPRAM HYDROBROMIDE 20 MG PO TABS Oral Take 20 mg by mouth every morning.    Marland Kitchen CLONAZEPAM 0.5 MG PO TBDP Oral Take 0.5 mg by mouth 2 (two) times daily.    Marland Kitchen NAPROXEN 500 MG PO TABS Oral Take 1 tablet (500 mg total) by mouth 2 (two) times daily. 15 tablet 0  . NORGESTIMATE-ETH ESTRADIOL 0.25-35 MG-MCG PO TABS Oral Take 1 tablet by mouth at bedtime. AT 11PM    . OMEPRAZOLE 20 MG PO CPDR Oral Take 20 mg by mouth 2 (two) times daily.  1 po 30 minutes before breakfast AND SUPPER    . TRAZODONE HCL 100 MG PO TABS Oral Take 100 mg by mouth at bedtime.    Marland Kitchen VALACYCLOVIR HCL 1 G PO TABS Oral Take 1,000 mg by mouth at bedtime.       BP 116/72  Pulse 89  Temp 98 F (36.7 C)  Resp 20  Ht 5\' 6"  (1.676 m)  Wt 298 lb (135.172 kg)  BMI 48.10 kg/m2  SpO2 99%  LMP 03/30/2012  Physical Exam  Nursing note and vitals reviewed. Constitutional: She is oriented to person, place, and time. She appears well-developed and well-nourished. No distress.       Morbidly obese  HENT:  Head: Normocephalic and atraumatic.  Cardiovascular: Normal rate, regular rhythm, normal  heart sounds and intact distal pulses.   No murmur heard. Pulmonary/Chest: Effort normal and breath sounds normal. No respiratory distress.  Musculoskeletal: She exhibits tenderness. She exhibits no edema.       Right hip: She exhibits tenderness. She exhibits normal range of motion, normal strength, no bony tenderness, no swelling, no crepitus, no deformity and no laceration.       Legs:      ttp of the anterior right thigh.  No obvious swelling, erythema, or bruising on exam.  Also has mild ttp of the right lower leg, again, w/o obvious edema.  DP pulse is brisk, distal sensation intact.  Moves right leg w/o difficulty.  NT with rotation of the hip.  Lumbar spine and SI joint also NT  Neurological: She is alert and oriented to person, place, and time. She exhibits normal muscle tone. Coordination normal.  Skin: Skin is warm and dry.    ED Course  Procedures (including critical care time)    Results for orders placed during the hospital encounter of 04/17/12  URINALYSIS, ROUTINE W REFLEX MICROSCOPIC      Component Value Range   Color, Urine YELLOW  YELLOW    APPearance CLEAR  CLEAR    Specific Gravity, Urine >1.030 (*) 1.005 - 1.030    pH 6.0  5.0 - 8.0    Glucose, UA NEGATIVE  NEGATIVE (mg/dL)   Hgb urine dipstick TRACE (*) NEGATIVE    Bilirubin Urine NEGATIVE  NEGATIVE    Ketones, ur TRACE (*) NEGATIVE (mg/dL)   Protein, ur NEGATIVE  NEGATIVE (mg/dL)   Urobilinogen, UA 0.2  0.0 - 1.0 (mg/dL)   Nitrite NEGATIVE  NEGATIVE    Leukocytes, UA NEGATIVE  NEGATIVE   POCT PREGNANCY, URINE      Component Value Range   Preg Test, Ur NEGATIVE  NEGATIVE   URINE MICROSCOPIC-ADD ON      Component Value Range   Squamous Epithelial / LPF FEW (*) RARE    WBC, UA 0-2  <3 (WBC/hpf)   RBC / HPF 0-2  <3 (RBC/hpf)   Bacteria, UA FEW (*) RARE    Urine-Other MUCOUS PRESENT      Dg Lumbar Spine Complete  04/17/2012  *RADIOLOGY REPORT*  Clinical Data: Right leg pain.  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  Normal alignment.  No fracture.  Disc spaces are maintained.  SI joints are symmetric and unremarkable.  IMPRESSION: No acute bony abnormality.  Original Report Authenticated By: Cyndie Chime, M.D.        MDM     Previous medical charts, nursing notes and vitals signs from this visit  were reviewed by me   All laboratory results and/or imaging results performed on this visit, if applicable, were reviewed by me and discussed with the patient and/or parent as well as recommendation for follow-up    MEDICATIONS GIVEN IN ED:  Percocet, lovenox SQ  Tenderness to the muscles of the right thigh.  No obvious discoloration, wound, excessive warmth or edema of the right leg or calf.  Although edema is difficult to access due to obesity.  DP pulse is brisk, distal sensation intact.  Doubt cellulitis.  I have scheduled pt to return here for venous US study of the leg    PRESCRIPTIONS GIVEN AT DISCHARGE:  norco #20     Pt stable in ED with no significant deterioration in condition. Pt feels improved after observation and/or treatment in ED. Patient / Family / Caregiver understand and agree with initial ED impression and plan with expectations set for ED visit.  Patient agrees to return to ED for any worsening symptoms         Delainie Chavana L. Rigdon [place], [place] 04/21/12 1303

## 2012-04-17 NOTE — ED Notes (Signed)
Pt c/o right leg swelling and pain that starts at the right thigh area and radiates down right leg, denies any injury

## 2012-04-18 ENCOUNTER — Ambulatory Visit (HOSPITAL_COMMUNITY)
Admit: 2012-04-18 | Discharge: 2012-04-18 | Disposition: A | Discharge status: Home / Self Care | Payer: Medicaid Other | Source: Ambulatory Visit | Attending: Emergency Medicine | Admitting: Emergency Medicine

## 2012-04-18 DIAGNOSIS — M7989 Other specified soft tissue disorders: Secondary | ICD-10-CM | POA: Insufficient documentation

## 2012-04-18 DIAGNOSIS — M79609 Pain in unspecified limb: Secondary | ICD-10-CM | POA: Insufficient documentation

## 2012-04-18 NOTE — ED Provider Notes (Signed)
The pt came to get the result of her doppler US.  I reviewed the result and notified her that the test was NEGATIVE for a dvt.  Cheri Guppy, MD 04/18/12 1410

## 2012-04-19 ENCOUNTER — Ambulatory Visit
Admission: RE | Admit: 2012-04-19 | Discharge: 2012-04-19 | Disposition: A | Discharge status: Home / Self Care | Payer: Medicaid Other | Source: Ambulatory Visit | Attending: Family Medicine | Admitting: Family Medicine

## 2012-04-19 DIAGNOSIS — M545 Low back pain: Secondary | ICD-10-CM

## 2012-04-19 MED ORDER — IOHEXOL 180 MG/ML  SOLN: 1.0000 mL | Freq: Once | INTRAMUSCULAR | Status: AC | PRN

## 2012-04-19 MED ORDER — METHYLPREDNISOLONE ACETATE 40 MG/ML INJ SUSP (RADIOLOG: 120.0000 mg | Freq: Once | INTRAMUSCULAR | Status: DC

## 2012-04-19 NOTE — Discharge Instructions (Signed)

## 2012-04-25 NOTE — ED Provider Notes (Signed)
Medical screening examination/treatment/procedure(s) were performed by non-physician practitioner and as supervising physician I was immediately available for consultation/collaboration.  Rainelle Sulewski R. Lonzy Mato, MD 04/25/12 2148 

## 2012-04-26 ENCOUNTER — Emergency Department (HOSPITAL_COMMUNITY)
Admission: EM | Admit: 2012-04-26 | Discharge: 2012-04-26 | Disposition: A | Discharge status: Home / Self Care | Payer: Medicaid Other | Attending: Emergency Medicine | Admitting: Emergency Medicine

## 2012-04-26 ENCOUNTER — Encounter (HOSPITAL_COMMUNITY): Payer: Self-pay | Admitting: *Deleted

## 2012-04-26 DIAGNOSIS — K219 Gastro-esophageal reflux disease without esophagitis: Secondary | ICD-10-CM | POA: Insufficient documentation

## 2012-04-26 DIAGNOSIS — M545 Low back pain, unspecified: Secondary | ICD-10-CM | POA: Insufficient documentation

## 2012-04-26 DIAGNOSIS — Z87891 Personal history of nicotine dependence: Secondary | ICD-10-CM | POA: Insufficient documentation

## 2012-04-26 DIAGNOSIS — M5416 Radiculopathy, lumbar region: Secondary | ICD-10-CM

## 2012-04-26 MED ORDER — PREDNISONE 10 MG PO TABS: ORAL_TABLET | ORAL | Status: DC

## 2012-04-26 MED ORDER — OXYCODONE-ACETAMINOPHEN 5-325 MG PO TABS: 1.0000 | ORAL_TABLET | ORAL | Status: AC | PRN

## 2012-04-26 NOTE — ED Provider Notes (Signed)
History     CSN: [id number]  Arrival date & time 04/26/12  1153   First MD Initiated Contact with Patient 04/26/12 1302      Chief Complaint  Patient presents with  . Back Pain    (Consider location/radiation/quality/duration/timing/severity/associated sxs/prior treatment) Patient is a 26 y.o. female presenting with back pain. The history is provided by the patient and a parent.  Back Pain  This is a chronic problem. The current episode started more than 1 week ago. The problem occurs constantly. The problem has been gradually worsening. The pain is associated with no known injury. The pain is present in the lumbar spine and sacro-iliac joint. The quality of the pain is described as burning and aching. The pain radiates to the right thigh and right knee. The pain is moderate. The symptoms are aggravated by twisting, bending and certain positions. The pain is the same all the time. Associated symptoms include leg pain and tingling. Pertinent negatives include no chest pain, no fever, no numbness, no abdominal pain, no abdominal swelling, no bowel incontinence, no perianal numbness, no bladder incontinence, no dysuria, no pelvic pain, no paresthesias, no paresis and no weakness. She has tried NSAIDs, analgesics and muscle relaxants for the symptoms. The treatment provided no relief.    Past Medical History  Diagnosis Date  . Herpes   . GERD (gastroesophageal reflux disease)   . Gastritis     Past Surgical History  Procedure Date  . Wisdom tooth extraction   . Esophagogastroduodenoscopy 01/19/2012    Procedure: ESOPHAGOGASTRODUODENOSCOPY (EGD);  Surgeon: West Bali, MD;  Location: [facility];  Service: Endoscopy;  Laterality: N/A;  1:30  . Bulging disc in back     Family History  Problem Relation Age of Onset  . Diabetes Mother   . Diabetes Other   . Hypertension Other     History  Substance Use Topics  . Smoking status: Former Smoker -- 3 years    Types: Cigarettes   Quit date: 07/28/2011  . Smokeless tobacco: Never Used  . Alcohol Use: No    OB History    Grav Para Term Preterm Abortions TAB SAB Ect Mult Living   1 1 1       1       Review of Systems  Constitutional: Negative for fever.  Respiratory: Negative for shortness of breath.   Cardiovascular: Negative for chest pain.  Gastrointestinal: Negative for vomiting, abdominal pain, constipation and bowel incontinence.  Genitourinary: Negative for bladder incontinence, dysuria, hematuria, flank pain, decreased urine volume, difficulty urinating and pelvic pain.       Perineal numbness or incontinence of urine or feces  Musculoskeletal: Positive for back pain. Negative for joint swelling.  Skin: Negative for rash.  Neurological: Positive for tingling. Negative for weakness, numbness and paresthesias.  All other systems reviewed and are negative.    Allergies  Review of patient's allergies indicates no known allergies.  Home Medications   Current Outpatient Rx  Name Route Sig Dispense Refill  . CITALOPRAM HYDROBROMIDE 20 MG PO TABS Oral Take 20 mg by mouth every morning.    Marland Kitchen CLONAZEPAM 0.5 MG PO TBDP Oral Take 0.5 mg by mouth 2 (two) times daily.    Marland Kitchen HYDROCODONE-ACETAMINOPHEN 7.5-325 MG PO TABS Oral Take 1 tablet by mouth every 4 (four) hours as needed for pain. 20 tablet 0  . NAPROXEN 500 MG PO TABS Oral Take 1 tablet (500 mg total) by mouth 2 (two) times daily. 15 tablet  0  . NORGESTIMATE-ETH ESTRADIOL 0.25-35 MG-MCG PO TABS Oral Take 1 tablet by mouth at bedtime. AT 11PM    . OMEPRAZOLE 20 MG PO CPDR Oral Take 20 mg by mouth 2 (two) times daily. 1 po 30 minutes before breakfast AND SUPPER    . TRAZODONE HCL 100 MG PO TABS Oral Take 100 mg by mouth at bedtime.    Marland Kitchen VALACYCLOVIR HCL 1 G PO TABS Oral Take 1,000 mg by mouth at bedtime.       BP 107/57  Pulse 97  Temp(Src) 98.1 F (36.7 C) (Oral)  Resp 18  Ht 5\' 6"  (1.676 m)  Wt 296 lb (134.265 kg)  BMI 47.78 kg/m2  SpO2 98%  LMP  03/30/2012  Physical Exam  Nursing note and vitals reviewed. Constitutional: She is oriented to person, place, and time. She appears well-developed and well-nourished. No distress.  HENT:  Head: Normocephalic and atraumatic.  Neck: Normal range of motion. Neck supple.  Cardiovascular: Normal rate, regular rhythm and intact distal pulses.   No murmur heard. Pulmonary/Chest: Effort normal and breath sounds normal.  Musculoskeletal: She exhibits tenderness. She exhibits no edema.       Lumbar back: She exhibits tenderness and pain. She exhibits normal range of motion, no swelling, no deformity, no laceration and normal pulse.       Back:  Neurological: She is alert and oriented to person, place, and time. No cranial nerve deficit or sensory deficit. She exhibits normal muscle tone. Coordination and gait normal.  Reflex Scores:      Patellar reflexes are 2+ on the right side and 2+ on the left side.      Achilles reflexes are 2+ on the right side and 2+ on the left side. Skin: Skin is warm and dry.    ED Course  Procedures (including critical care time)       MDM     Patient seen by on previous exam.  Ambulates well.  HAd MRI last month, result reviewed by me.  Seen by PMD 4 days ago.  No focal neuro deficits on exam.  Waiting for appt with neurosurgery   Patient has ttp of the right lumbar paraspinal muscles and right SI joint space.  No focal neuro deficits on exam.  Ambulates with a steady gait.    Prescribed: Percocet #20 Prednisone taper   Aiko Belko L. [place], [place] 04/29/12 2206

## 2012-04-26 NOTE — ED Notes (Signed)
Low back pain with pain down rt leg.  Onset this am.  No injury.Tingling  In toes and numbness of rt leg

## 2012-04-26 NOTE — Discharge Instructions (Signed)

## 2012-05-10 NOTE — ED Provider Notes (Signed)
Medical screening examination/treatment/procedure(s) were performed by non-physician practitioner and as supervising physician I was immediately available for consultation/collaboration.   [name], MD 05/10/12 0730

## 2012-05-12 ENCOUNTER — Other Ambulatory Visit: Payer: Self-pay | Admitting: Neurosurgery

## 2012-05-12 DIAGNOSIS — M5410 Radiculopathy, site unspecified: Secondary | ICD-10-CM

## 2012-05-12 DIAGNOSIS — M549 Dorsalgia, unspecified: Secondary | ICD-10-CM

## 2012-05-15 ENCOUNTER — Emergency Department (HOSPITAL_COMMUNITY)
Admission: EM | Admit: 2012-05-15 | Discharge: 2012-05-16 | Disposition: A | Discharge status: Home / Self Care | Payer: Medicaid Other | Attending: Emergency Medicine | Admitting: Emergency Medicine

## 2012-05-15 ENCOUNTER — Encounter (HOSPITAL_COMMUNITY): Payer: Self-pay | Admitting: Emergency Medicine

## 2012-05-15 ENCOUNTER — Emergency Department (HOSPITAL_COMMUNITY): Payer: Medicaid Other

## 2012-05-15 DIAGNOSIS — M25569 Pain in unspecified knee: Secondary | ICD-10-CM | POA: Insufficient documentation

## 2012-05-15 DIAGNOSIS — K219 Gastro-esophageal reflux disease without esophagitis: Secondary | ICD-10-CM | POA: Insufficient documentation

## 2012-05-15 DIAGNOSIS — Z79899 Other long term (current) drug therapy: Secondary | ICD-10-CM | POA: Insufficient documentation

## 2012-05-15 DIAGNOSIS — M25561 Pain in right knee: Secondary | ICD-10-CM

## 2012-05-15 HISTORY — DX: Reserved for concepts with insufficient information to code with codable children: IMO0002

## 2012-05-15 NOTE — ED Notes (Signed)
Patient states that she was sliding out of bed when he lost her footing and twisted her right knee. States that she heard and felt a pop in her knee when it happened, states this happened 1 hour ago. Denies any other symptoms, right knee appears slightly swollen and red. Pt states pain 7/10 when sitting still.

## 2012-05-16 MED ORDER — HYDROCODONE-ACETAMINOPHEN 5-325 MG PO TABS: 1.0000 | ORAL_TABLET | Freq: Four times a day (QID) | ORAL | Status: AC | PRN

## 2012-05-16 MED ORDER — IBUPROFEN 800 MG PO TABS
800.0000 mg | ORAL_TABLET | Freq: Once | ORAL | Status: AC
  Administered 2012-05-16: 800 mg via ORAL
  Filled 2012-05-16: qty 1

## 2012-05-16 MED ORDER — HYDROCODONE-ACETAMINOPHEN 5-325 MG PO TABS
1.0000 | ORAL_TABLET | Freq: Once | ORAL | Status: AC
  Administered 2012-05-16: 1 via ORAL
  Filled 2012-05-16: qty 1

## 2012-05-16 NOTE — Discharge Instructions (Signed)
Cryotherapy Cryotherapy means treatment with cold. Ice or gel packs can be used to reduce both pain and swelling. Ice is the most helpful within the first 24 to 48 hours after an injury or flareup from overusing a muscle or joint. Sprains, strains, spasms, burning pain, shooting pain, and aches can all be eased with ice. Ice can also be used when recovering from surgery. Ice is effective, has very few side effects, and is safe for most people to use. PRECAUTIONS  Ice is not a safe treatment option for people with:  Raynaud's phenomenon. This is a condition affecting small blood vessels in the extremities. Exposure to cold may cause your problems to return.   Cold hypersensitivity. There are many forms of cold hypersensitivity, including:   Cold urticaria. Red, itchy hives appear on the skin when the tissues begin to warm after being iced.   Cold erythema. This is a red, itchy rash caused by exposure to cold.   Cold hemoglobinuria. Red blood cells break down when the tissues begin to warm after being iced. The hemoglobin that carry oxygen are passed into the urine because they cannot combine with blood proteins fast enough.   Numbness or altered sensitivity in the area being iced.  If you have any of the following conditions, do not use ice until you have discussed cryotherapy with your caregiver:  Heart conditions, such as arrhythmia, angina, or chronic heart disease.   High blood pressure.   Healing wounds or open skin in the area being iced.   Current infections.   Rheumatoid arthritis.   Poor circulation.   Diabetes.  Ice slows the blood flow in the region it is applied. This is beneficial when trying to stop inflamed tissues from spreading irritating chemicals to surrounding tissues. However, if you expose your skin to cold temperatures for too long or without the proper protection, you can damage your skin or nerves. Watch for signs of skin damage due to cold. HOME CARE  INSTRUCTIONS Follow these tips to use ice and cold packs safely.  Place a dry or damp towel between the ice and skin. A damp towel will cool the skin more quickly, so you may need to shorten the time that the ice is used.   For a more rapid response, add gentle compression to the ice.   Ice for no more than 10 to 20 minutes at a time. The bonier the area you are icing, the less time it will take to get the benefits of ice.   Check your skin after 5 minutes to make sure there are no signs of a poor response to cold or skin damage.   Rest 20 minutes or more in between uses.   Once your skin is numb, you can end your treatment. You can test numbness by very lightly touching your skin. The touch should be so light that you do not see the skin dimple from the pressure of your fingertip. When using ice, most people will feel these normal sensations in this order: cold, burning, aching, and numbness.   Do not use ice on someone who cannot communicate their responses to pain, such as small children or people with dementia.  HOW TO MAKE AN ICE PACK Ice packs are the most common way to use ice therapy. Other methods include ice massage, ice baths, and cryo-sprays. Muscle creams that cause a cold, tingly feeling do not offer the same benefits that ice offers and should not be used as a substitute  unless recommended by your caregiver. To make an ice pack, do one of the following:  Place crushed ice or a bag of frozen vegetables in a sealable plastic bag. Squeeze out the excess air. Place this bag inside another plastic bag. Slide the bag into a pillowcase or place a damp towel between your skin and the bag.   Mix 3 parts water with 1 part rubbing alcohol. Freeze the mixture in a sealable plastic bag. When you remove the mixture from the freezer, it will be slushy. Squeeze out the excess air. Place this bag inside another plastic bag. Slide the bag into a pillowcase or place a damp towel between your skin  and the bag.  SEEK MEDICAL CARE IF:  You develop white spots on your skin. This may give the skin a blotchy (mottled) appearance.   Your skin turns blue or pale.   Your skin becomes waxy or hard.   Your swelling gets worse.  MAKE SURE YOU:   Understand these instructions.   Will watch your condition.   Will get help right away if you are not doing well or get worse.  Document Released: 06/29/2011 Document Revised: 10/22/2011 Document Reviewed: 06/29/2011 [facility] Patient Information 2012 [place], [place].Knee Pain The knee is the complex joint between your thigh and your lower leg. It is made up of bones, tendons, ligaments, and cartilage. The bones that make up the knee are:  The femur in the thigh.   The tibia and fibula in the lower leg.   The patella or kneecap riding in the groove on the lower femur.  CAUSES  Knee pain is a common complaint with many causes. A few of these causes are:  Injury, such as:   A ruptured ligament or tendon injury.   Torn cartilage.   Medical conditions, such as:   Gout   Arthritis   Infections   Overuse, over training or overdoing a physical activity.  Knee pain can be minor or severe. Knee pain can accompany debilitating injury. Minor knee problems often respond well to self-care measures or get well on their own. More serious injuries may need medical intervention or even surgery. SYMPTOMS The knee is complex. Symptoms of knee problems can vary widely. Some of the problems are:  Pain with movement and weight bearing.   Swelling and tenderness.   Buckling of the knee.   Inability to straighten or extend your knee.   Your knee locks and you cannot straighten it.   Warmth and redness with pain and fever.   Deformity or dislocation of the kneecap.  DIAGNOSIS  Determining what is wrong may be very straight forward such as when there is an injury. It can also be challenging because of the complexity of the knee. Tests to make a  diagnosis may include:  Your caregiver taking a history and doing a physical exam.   Routine X-rays can be used to rule out other problems. X-rays will not reveal a cartilage tear. Some injuries of the knee can be diagnosed by:   Arthroscopy a surgical technique by which a small video camera is inserted through tiny incisions on the sides of the knee. This procedure is used to examine and repair internal knee joint problems. Tiny instruments can be used during arthroscopy to repair the torn knee cartilage (meniscus).   Arthrography is a radiology technique. A contrast liquid is directly injected into the knee joint. Internal structures of the knee joint then become visible on X-ray film.  An MRI scan is a non x-ray radiology procedure in which magnetic fields and a computer produce two- or three-dimensional images of the inside of the knee. Cartilage tears are often visible using an MRI scanner. MRI scans have largely replaced arthrography in diagnosing cartilage tears of the knee.   Blood work.   Examination of the fluid that helps to lubricate the knee joint (synovial fluid). This is done by taking a sample out using a needle and a syringe.  TREATMENT The treatment of knee problems depends on the cause. Some of these treatments are:  Depending on the injury, proper casting, splinting, surgery or physical therapy care will be needed.   Give yourself adequate recovery time. Do not overuse your joints. If you begin to get sore during workout routines, back off. Slow down or do fewer repetitions.   For repetitive activities such as cycling or running, maintain your strength and nutrition.   Alternate muscle groups. For example if you are a weight lifter, work the upper body on one day and the lower body the next.   Either tight or weak muscles do not give the proper support for your knee. Tight or weak muscles do not absorb the stress placed on the knee joint. Keep the muscles surrounding  the knee strong.   Take care of mechanical problems.   If you have flat feet, orthotics or special shoes may help. See your caregiver if you need help.   Arch supports, sometimes with wedges on the inner or outer aspect of the heel, can help. These can shift pressure away from the side of the knee most bothered by osteoarthritis.   A brace called an "unloader" brace also may be used to help ease the pressure on the most arthritic side of the knee.   If your caregiver has prescribed crutches, braces, wraps or ice, use as directed. The acronym for this is PRICE. This means protection, rest, ice, compression and elevation.   Nonsteroidal anti-inflammatory drugs (NSAID's), can help relieve pain. But if taken immediately after an injury, they may actually increase swelling. Take NSAID's with food in your stomach. Stop them if you develop stomach problems. Do not take these if you have a history of ulcers, stomach pain or bleeding from the bowel. Do not take without your caregiver's approval if you have problems with fluid retention, heart failure, or kidney problems.   For ongoing knee problems, physical therapy may be helpful.   Glucosamine and chondroitin are over-the-counter dietary supplements. Both may help relieve the pain of osteoarthritis in the knee. These medicines are different from the usual anti-inflammatory drugs. Glucosamine may decrease the rate of cartilage destruction.   Injections of a corticosteroid drug into your knee joint may help reduce the symptoms of an arthritis flare-up. They may provide pain relief that lasts a few months. You may have to wait a few months between injections. The injections do have a small increased risk of infection, water retention and elevated blood sugar levels.   Hyaluronic acid injected into damaged joints may ease pain and provide lubrication. These injections may work by reducing inflammation. A series of shots may give relief for as long as 6  months.   Topical painkillers. Applying certain ointments to your skin may help relieve the pain and stiffness of osteoarthritis. Ask your pharmacist for suggestions. Many over the-counter products are approved for temporary relief of arthritis pain.   In some countries, doctors often prescribe topical NSAID's for relief of chronic conditions  such as arthritis and tendinitis. A review of treatment with NSAID creams found that they worked as well as oral medications but without the serious side effects.  PREVENTION  Maintain a healthy weight. Extra pounds put more strain on your joints.   Get strong, stay limber. Weak muscles are a common cause of knee injuries. Stretching is important. Include flexibility exercises in your workouts.   Be smart about exercise. If you have osteoarthritis, chronic knee pain or recurring injuries, you may need to change the way you exercise. This does not mean you have to stop being active. If your knees ache after jogging or playing basketball, consider switching to swimming, water aerobics or other low-impact activities, at least for a few days a week. Sometimes limiting high-impact activities will provide relief.   Make sure your shoes fit well. Choose footwear that is right for your sport.   Protect your knees. Use the proper gear for knee-sensitive activities. Use kneepads when playing volleyball or laying carpet. Buckle your seat belt every time you drive. Most shattered kneecaps occur in car accidents.   Rest when you are tired.  SEEK MEDICAL CARE IF:  You have knee pain that is continual and does not seem to be getting better.  SEEK IMMEDIATE MEDICAL CARE IF:  Your knee joint feels hot to the touch and you have a high fever. MAKE SURE YOU:   Understand these instructions.   Will watch your condition.   Will get help right away if you are not doing well or get worse.  Document Released: 08/30/2007 Document Revised: 10/22/2011 Document Reviewed:  08/30/2007 [facility] Patient Information 2012 [place], [place].   Take the pain medicine as directed.  Take ibuprofen up to 800 mg every 8 hrs with food.  Apply ice several times daily.  Use your crutches as needed and bear weight as tolerated.  Follow up with dr Romeo Apple as needed.

## 2012-05-16 NOTE — ED Provider Notes (Signed)
Medical screening examination/treatment/procedure(s) were performed by non-physician practitioner and as supervising physician I was immediately available for consultation/collaboration.  Nicoletta Dress. Colon Branch, MD 05/16/12 2233

## 2012-05-16 NOTE — ED Provider Notes (Signed)
History     CSN: [id number]  Arrival date & time 05/15/12  2158   First MD Initiated Contact with Patient 05/15/12 2328      Chief Complaint  Patient presents with  . Knee Pain    (Consider location/radiation/quality/duration/timing/severity/associated sxs/prior treatment) HPI Comments: Got out of bed this PM and felt R knee "pop" and has had pain since then".  No pre-existing problem and no recent injury to the knee.  No fever or chills.  Has not taken any pain meds this PM.  Patient is a 26 y.o. female presenting with knee pain. The history is provided by the patient. No language interpreter was used.  Knee Pain This is a new problem. The current episode started today. The problem occurs constantly. The problem has been unchanged. Pertinent negatives include no chills, fever or weakness. The symptoms are aggravated by walking and standing. She has tried nothing for the symptoms.    Past Medical History  Diagnosis Date  . Herpes   . GERD (gastroesophageal reflux disease)   . Gastritis   . Disk prolapse     Past Surgical History  Procedure Date  . Wisdom tooth extraction   . Esophagogastroduodenoscopy 01/19/2012    Procedure: ESOPHAGOGASTRODUODENOSCOPY (EGD);  Surgeon: West Bali, MD;  Location: [facility];  Service: Endoscopy;  Laterality: N/A;  1:30  . Bulging disc in back     Family History  Problem Relation Age of Onset  . Diabetes Mother   . Diabetes Other   . Hypertension Other     History  Substance Use Topics  . Smoking status: Former Smoker -- 3 years    Types: Cigarettes    Quit date: 07/28/2011  . Smokeless tobacco: Never Used  . Alcohol Use: No    OB History    Grav Para Term Preterm Abortions TAB SAB Ect Mult Living   1 1 1       1       Review of Systems  Constitutional: Negative for fever and chills.  Musculoskeletal:       Knee pain   Neurological: Negative for weakness.  All other systems reviewed and are  negative.    Allergies  Review of patient's allergies indicates no known allergies.  Home Medications   Current Outpatient Rx  Name Route Sig Dispense Refill  . CITALOPRAM HYDROBROMIDE 20 MG PO TABS Oral Take 20 mg by mouth every morning.    Marland Kitchen CLONAZEPAM 0.5 MG PO TBDP Oral Take 0.5 mg by mouth 2 (two) times daily.    Marland Kitchen NORGESTIMATE-ETH ESTRADIOL 0.25-35 MG-MCG PO TABS Oral Take 1 tablet by mouth at bedtime. AT 11PM    . OMEPRAZOLE 20 MG PO CPDR Oral Take 20 mg by mouth 2 (two) times daily. 1 po 30 minutes before breakfast AND SUPPER    . TRAZODONE HCL 100 MG PO TABS Oral Take 100 mg by mouth at bedtime.    Marland Kitchen VALACYCLOVIR HCL 1 G PO TABS Oral Take 1,000 mg by mouth at bedtime.     Marland Kitchen HYDROCODONE-ACETAMINOPHEN 5-325 MG PO TABS Oral Take 1 tablet by mouth every 6 (six) hours as needed for pain. 20 tablet 0    BP 127/70  Pulse 91  Temp 97.7 F (36.5 C) (Oral)  Resp 18  Ht 5\' 6"  (1.676 m)  Wt 295 lb (133.811 kg)  BMI 47.61 kg/m2  SpO2 98%  LMP 05/01/2012  Physical Exam  Nursing note and vitals reviewed. Constitutional: She is oriented to  person, place, and time. She appears well-developed and well-nourished. No distress.  HENT:  Head: Normocephalic and atraumatic.  Eyes: EOM are normal.  Neck: Normal range of motion.  Cardiovascular: Normal rate, regular rhythm and normal heart sounds.   Pulmonary/Chest: Effort normal and breath sounds normal.  Abdominal: Soft. She exhibits no distension. There is no tenderness.  Musculoskeletal:       Right knee: She exhibits decreased range of motion. She exhibits no swelling, no deformity, no erythema and normal alignment. tenderness found.       Diffuse R knee pain.  Patellar tendon intact.  Pain with movement and with weight bearing.  No visible swelling, ecchymosis or erythema.  Skin intact.  Neurological: She is alert and oriented to person, place, and time.  Skin: Skin is warm and dry.  Psychiatric: She has a normal mood and affect.  Judgment normal.    ED Course  Procedures (including critical care time)  Labs Reviewed - No data to display Dg Knee Complete 4 Views Right  05/16/2012  *RADIOLOGY REPORT*  Clinical Data: Right knee pain.  RIGHT KNEE - COMPLETE 4+ VIEW  Comparison: None.  Findings: There is medial joint space loss. Patella appears mildly high-riding however this is likely positional.  No joint effusion. No definite acute fracture or dislocation identified.  IMPRESSION: Mild medial joint space loss.  Mild high-riding position to the patella is likely positional. Correlate with examination.  No acute fracture or joint effusion.  Original Report Authenticated By: Waneta Martins, M.D.     1. Right knee pain       MDM  rx- hydrocodone, 20 OTC ibuprofen Ice Knee immobilizer Crutches (has) F/u with dr. Romeo Apple prn        Evalina Field, PA 05/16/12 [phone]

## 2012-05-17 ENCOUNTER — Ambulatory Visit
Admission: RE | Admit: 2012-05-17 | Discharge: 2012-05-17 | Disposition: A | Discharge status: Home / Self Care | Payer: Medicaid Other | Source: Ambulatory Visit | Attending: Neurosurgery | Admitting: Neurosurgery

## 2012-05-17 ENCOUNTER — Encounter: Payer: Self-pay | Admitting: Gastroenterology

## 2012-05-17 VITALS — BP 104/65 | HR 100

## 2012-05-17 DIAGNOSIS — M549 Dorsalgia, unspecified: Secondary | ICD-10-CM

## 2012-05-17 DIAGNOSIS — M5410 Radiculopathy, site unspecified: Secondary | ICD-10-CM

## 2012-05-17 MED ORDER — IOHEXOL 180 MG/ML  SOLN
15.0000 mL | Freq: Once | INTRAMUSCULAR | Status: AC | PRN
  Administered 2012-05-17: 15 mL via INTRATHECAL

## 2012-05-17 MED ORDER — HYDROMORPHONE HCL PF 2 MG/ML IJ SOLN
2.0000 mg | Freq: Once | INTRAMUSCULAR | Status: AC
  Administered 2012-05-17: 2 mg via INTRAMUSCULAR

## 2012-05-17 MED ORDER — ONDANSETRON HCL 4 MG/2ML IJ SOLN
4.0000 mg | Freq: Once | INTRAMUSCULAR | Status: AC
  Administered 2012-05-17: 4 mg via INTRAMUSCULAR

## 2012-05-17 MED ORDER — DIAZEPAM 5 MG PO TABS
10.0000 mg | ORAL_TABLET | Freq: Once | ORAL | Status: AC
  Administered 2012-05-17: 10 mg via ORAL

## 2012-05-17 MED ORDER — ONDANSETRON HCL 4 MG/2ML IJ SOLN: 4.0000 mg | Freq: Four times a day (QID) | INTRAMUSCULAR | Status: DC | PRN

## 2012-05-17 NOTE — Discharge Instructions (Signed)
Myelogram Discharge Instructions  1. Go home and rest quietly for the next 24 hours.  It is important to lie flat for the next 24 hours.  Get up only to go to the restroom.  You may lie in the bed or on a couch on your back, your stomach, your left side or your right side.  You may have one pillow under your head.  You may have pillows between your knees while you are on your side or under your knees while you are on your back.  2. DO NOT drive today.  Recline the seat as far back as it will go, while still wearing your seat belt, on the way home.  3. You may get up to go to the bathroom as needed.  You may sit up for 10 minutes to eat.  You may resume your normal diet and medications unless otherwise indicated.  Drink lots of extra fluids today and tomorrow.  4. The incidence of headache, nausea, or vomiting is about 5% (one in 20 patients).  If you develop a headache, lie flat and drink plenty of fluids until the headache goes away.  Caffeinated beverages may be helpful.  If you develop severe nausea and vomiting or a headache that does not go away with flat bed rest, call [phone].  5. You may resume normal activities after your 24 hours of bed rest is over; however, do not exert yourself strongly or do any heavy lifting tomorrow. If when you get up you have a headache when standing, go back to bed and force fluids for another 24 hours.  6. Call your physician for a follow-up appointment.  The results of your myelogram will be sent directly to your physician by the following day.  7. If you have any questions or if complications develop after you arrive home, please call [phone].  Discharge instructions have been explained to the patient.  The patient, or the person responsible for the patient, fully understands these instructions.       May resume trazadone and citalopram on May 18, 2012, after 11:00 am.

## 2012-05-20 ENCOUNTER — Ambulatory Visit: Payer: Medicaid Other | Admitting: Urgent Care

## 2012-06-07 ENCOUNTER — Emergency Department (HOSPITAL_COMMUNITY)
Admission: EM | Admit: 2012-06-07 | Discharge: 2012-06-07 | Disposition: A | Discharge status: Home / Self Care | Payer: Medicaid Other | Attending: Emergency Medicine | Admitting: Emergency Medicine

## 2012-06-07 ENCOUNTER — Encounter (HOSPITAL_COMMUNITY): Payer: Self-pay | Admitting: Emergency Medicine

## 2012-06-07 ENCOUNTER — Ambulatory Visit: Payer: Medicaid Other | Admitting: Urgent Care

## 2012-06-07 DIAGNOSIS — K219 Gastro-esophageal reflux disease without esophagitis: Secondary | ICD-10-CM | POA: Insufficient documentation

## 2012-06-07 DIAGNOSIS — Z87891 Personal history of nicotine dependence: Secondary | ICD-10-CM | POA: Insufficient documentation

## 2012-06-07 DIAGNOSIS — R21 Rash and other nonspecific skin eruption: Secondary | ICD-10-CM

## 2012-06-07 DIAGNOSIS — R079 Chest pain, unspecified: Secondary | ICD-10-CM

## 2012-06-07 DIAGNOSIS — R071 Chest pain on breathing: Secondary | ICD-10-CM | POA: Insufficient documentation

## 2012-06-07 MED ORDER — KETOROLAC TROMETHAMINE 30 MG/ML IJ SOLN
30.0000 mg | Freq: Once | INTRAMUSCULAR | Status: AC
  Administered 2012-06-07: 30 mg via INTRAVENOUS
  Filled 2012-06-07: qty 1

## 2012-06-07 MED ORDER — SULFAMETHOXAZOLE-TRIMETHOPRIM 800-160 MG PO TABS: 1.0000 | ORAL_TABLET | Freq: Two times a day (BID) | ORAL | Status: AC

## 2012-06-07 NOTE — ED Provider Notes (Signed)
History     CSN: [id number]  Arrival date & time 06/07/12  2052   First MD Initiated Contact with Patient 06/07/12 2103      Chief Complaint  Patient presents with  . Chest Pain  . Rash    Patient is a 26 y.o. female presenting with chest pain. The history is provided by the patient.  Chest Pain The chest pain began less than 1 hour ago. Chest pain occurs constantly. The chest pain is unchanged. Associated with: palpation. The severity of the pain is moderate. The quality of the pain is described as sharp. The pain does not radiate. Chest pain is worsened by certain positions. Primary symptoms include shortness of breath. Pertinent negatives for primary symptoms include no fever, no fatigue, no syncope, no cough, no palpitations, no abdominal pain, no nausea, no vomiting and no dizziness.  Pertinent negatives for associated symptoms include no diaphoresis. She tried nothing for the symptoms.    Pt reports she was resting comfortably and had onset of chest wall pain - hurts worse with movement of her body.  she reports mild SOB. The pain does not radiate.  No cough  She also reports rash to her chest and abdomen unknown time of onset Past Medical History  Diagnosis Date  . Herpes   . GERD (gastroesophageal reflux disease)   . Gastritis   . Disk prolapse     Past Surgical History  Procedure Date  . Wisdom tooth extraction   . Esophagogastroduodenoscopy 01/19/2012    ULCERS,MULTIPLE IN THE ANTRUM/HIATAL HERNIA/  . Bulging disc in back     Family History  Problem Relation Age of Onset  . Diabetes Mother   . Diabetes Other   . Hypertension Other     History  Substance Use Topics  . Smoking status: Former Smoker -- 3 years    Types: Cigarettes    Quit date: 07/28/2011  . Smokeless tobacco: Never Used  . Alcohol Use: No    OB History    Grav Para Term Preterm Abortions TAB SAB Ect Mult Living   1 1 1       1       Review of Systems  Constitutional: Negative for  fever, diaphoresis and fatigue.  Respiratory: Positive for shortness of breath. Negative for cough.   Cardiovascular: Positive for chest pain. Negative for palpitations and syncope.  Gastrointestinal: Negative for nausea, vomiting and abdominal pain.  Neurological: Negative for dizziness.  All other systems reviewed and are negative.    Allergies  Review of patient's allergies indicates no known allergies.  Home Medications   Current Outpatient Rx  Name Route Sig Dispense Refill  . CITALOPRAM HYDROBROMIDE 40 MG PO TABS Oral Take 40 mg by mouth every morning.    Marland Kitchen CLONAZEPAM 0.5 MG PO TBDP Oral Take 0.5 mg by mouth 3 (three) times daily.     Marland Kitchen NORGESTIM-ETH ESTRAD TRIPHASIC 0.18/0.215/0.25 MG-35 MCG PO TABS Oral Take 1 tablet by mouth at bedtime.    . OMEPRAZOLE 20 MG PO CPDR Oral Take 20 mg by mouth 2 (two) times daily. 1 po 30 minutes before breakfast AND SUPPER    . TRAZODONE HCL 100 MG PO TABS Oral Take 100 mg by mouth at bedtime.    Marland Kitchen VALACYCLOVIR HCL 1 G PO TABS Oral Take 1,000 mg by mouth at bedtime.       BP 98/60  Pulse 98  Temp 98.1 F (36.7 C) (Oral)  Resp 20  LMP 06/07/2012  Physical Exam CONSTITUTIONAL: Well developed/well nourished HEAD AND FACE: Normocephalic/atraumatic EYES: EOMI/PERRL ENMT: Mucous membranes moist NECK: supple no meningeal signs SPINE:entire spine nontender CV: S1/S2 noted, no murmurs/rubs/gallops noted Chest - tender to palpation in the exact region that she reports pain.  No crepitance.  No bruising.   Pain is reproduced with movement of her upper extremities LUNGS: Lungs are clear to auscultation bilaterally, no apparent distress.  No tachypnea.   ABDOMEN: soft, nontender, no rebound or guarding GU:no cva tenderness NEURO: Pt is awake/alert, moves all extremitiesx4 EXTREMITIES: pulses normal, full ROM SKIN: warm, color normal. small Scattered erythematous pustules to chest, right arm and abdominal wall.  No abscess.  No fluctuance.  No  erythematous streaking.   PSYCH: no abnormalities of mood noted  ED Course  Procedures  Pt well appearing.  She has focal tenderness to chest wall, no crepitance or focal lung findings.  I doubt PE/Dissection/ACS at this time  For her rash, will treat with bactrim but no abscess to drain   MDM  Nursing notes including past medical history and social history reviewed and considered in documentation Previous records reviewed and considered        Date: 06/07/2012  Rate: 86  Rhythm: normal sinus rhythm  QRS Axis: normal  Intervals: normal  ST/T Wave abnormalities: normal  Conduction Disutrbances:none  Narrative Interpretation:   Old EKG Reviewed: unchanged    Joya Gaskins, MD 06/07/12 2145

## 2012-06-07 NOTE — ED Notes (Signed)
Pt presents with chest pain that started one hour ago, states pain is stabbing in her left chest. Also noted rash on chest

## 2012-06-10 ENCOUNTER — Emergency Department (HOSPITAL_COMMUNITY)
Admission: EM | Admit: 2012-06-10 | Discharge: 2012-06-10 | Disposition: A | Discharge status: Home / Self Care | Payer: Medicaid Other | Attending: Emergency Medicine | Admitting: Emergency Medicine

## 2012-06-10 ENCOUNTER — Emergency Department (HOSPITAL_COMMUNITY): Payer: Medicaid Other

## 2012-06-10 ENCOUNTER — Encounter (HOSPITAL_COMMUNITY): Payer: Self-pay | Admitting: Emergency Medicine

## 2012-06-10 DIAGNOSIS — K219 Gastro-esophageal reflux disease without esophagitis: Secondary | ICD-10-CM | POA: Insufficient documentation

## 2012-06-10 DIAGNOSIS — R109 Unspecified abdominal pain: Secondary | ICD-10-CM

## 2012-06-10 DIAGNOSIS — R1031 Right lower quadrant pain: Secondary | ICD-10-CM | POA: Insufficient documentation

## 2012-06-10 DIAGNOSIS — R161 Splenomegaly, not elsewhere classified: Secondary | ICD-10-CM | POA: Insufficient documentation

## 2012-06-10 HISTORY — DX: Reserved for concepts with insufficient information to code with codable children: IMO0002

## 2012-06-10 LAB — BASIC METABOLIC PANEL
BUN: 16 mg/dL (ref 6–23)
Chloride: 105 mEq/L (ref 96–112)
GFR calc Af Amer: >90 mL/min (ref >90)
GFR calc non Af Amer: >90 mL/min (ref >90)
Potassium: 4.2 mEq/L (ref 3.5–5.1)
Sodium: 140 mEq/L (ref 135–145)

## 2012-06-10 LAB — CBC WITH DIFFERENTIAL/PLATELET
Basophils Relative: 0 % (ref 0–1)
Eosinophils Absolute: 0.1 10*3/uL (ref 0.0–0.7)
Hemoglobin: 13.5 g/dL (ref 12.0–15.0)
MCHC: 34.9 g/dL (ref 30.0–36.0)
Monocytes Relative: 7 % (ref 3–12)
Neutro Abs: 6.7 10*3/uL (ref 1.7–7.7)
Neutrophils Relative %: 74 % (ref 43–77)
Platelets: 241 10*3/uL (ref 150–400)
RBC: 4.35 MIL/uL (ref 3.87–5.11)

## 2012-06-10 LAB — URINALYSIS, ROUTINE W REFLEX MICROSCOPIC
Glucose, UA: NEGATIVE mg/dL
Leukocytes, UA: NEGATIVE
Nitrite: NEGATIVE
Protein, ur: NEGATIVE mg/dL
pH: 6 (ref 5.0–8.0)

## 2012-06-10 LAB — URINE MICROSCOPIC-ADD ON

## 2012-06-10 LAB — PREGNANCY, URINE: Preg Test, Ur: NEGATIVE

## 2012-06-10 MED ORDER — PROMETHAZINE HCL 25 MG PO TABS: 25.0000 mg | ORAL_TABLET | Freq: Four times a day (QID) | ORAL | Status: DC | PRN

## 2012-06-10 MED ORDER — OXYCODONE-ACETAMINOPHEN 5-325 MG PO TABS: 1.0000 | ORAL_TABLET | Freq: Four times a day (QID) | ORAL | Status: AC | PRN

## 2012-06-10 MED ORDER — PROMETHAZINE HCL 25 MG/ML IJ SOLN: 25.0000 mg | Freq: Once | INTRAMUSCULAR | Status: DC

## 2012-06-10 MED ORDER — PROMETHAZINE HCL 25 MG/ML IJ SOLN
25.0000 mg | Freq: Once | INTRAMUSCULAR | Status: AC
  Administered 2012-06-10: 25 mg via INTRAMUSCULAR
  Filled 2012-06-10: qty 1

## 2012-06-10 MED ORDER — HYDROMORPHONE HCL PF 2 MG/ML IJ SOLN
2.0000 mg | Freq: Once | INTRAMUSCULAR | Status: AC
  Administered 2012-06-10: 2 mg via INTRAMUSCULAR
  Filled 2012-06-10: qty 1

## 2012-06-10 MED ORDER — KETOROLAC TROMETHAMINE 60 MG/2ML IM SOLN
60.0000 mg | Freq: Once | INTRAMUSCULAR | Status: AC
  Administered 2012-06-10: 60 mg via INTRAMUSCULAR
  Filled 2012-06-10: qty 2

## 2012-06-10 NOTE — ED Notes (Signed)
Returned from CT, no acute distress noted, smiles and converses easily.

## 2012-06-10 NOTE — ED Notes (Signed)
Patient complaining of right lower abdominal that started last night, worse today. States has vomited once, feels nauseous.

## 2012-06-10 NOTE — ED Notes (Addendum)
Patient now tearful, states that her pain is worse, MD made aware.  Nausea and gagging, but no vomiting at present.

## 2012-06-10 NOTE — ED Provider Notes (Signed)
History  This chart was scribed for Geoffery Lyons, MD by Bennett Scrape. This patient was seen in room APA05/[facility]05 and the patient's care was started at 7:16PM.  CSN: [id number]  Arrival date & time 06/10/12  1845   First MD Initiated Contact with Patient 06/10/12 1916      Chief Complaint  Patient presents with  . Abdominal Pain    The history is provided by the patient. No language interpreter was used.    [name] is a 26 y.o. female who presents to the Emergency Department complaining of 24 hours of intermittent RLQ abdominal pain described as sharp with associated nausea and one episode of non-bloody emesis. She states that the pain originally improved but became more severe tonight. She also reports being on her menstrual cycle for the past two weeks which she states is abnormal. She denies the possibility of pregnancy stating that she has taken 20 different pregnancy tests that all came back negative. She denies having prior episodes of similar symptoms. She denies diarrhea, fever, back pain and urinary symptoms as associated symptoms. She has a h/o Herpes, GERD and gastritis. She is a former smoker but denies alcohol use.  PCP is Dr. Renard Matter.  Pt has been seen in the ED several times within the past 4 months for similar symptoms.  Past Medical History  Diagnosis Date  . Herpes   . GERD (gastroesophageal reflux disease)   . Gastritis   . Disk prolapse   . Bulging disc     Past Surgical History  Procedure Date  . Wisdom tooth extraction   . Esophagogastroduodenoscopy 01/19/2012    ULCERS,MULTIPLE IN THE ANTRUM/HIATAL HERNIA/  . Bulging disc in back     Family History  Problem Relation Age of Onset  . Diabetes Mother   . Diabetes Other   . Hypertension Other     History  Substance Use Topics  . Smoking status: Former Smoker -- 3 years    Types: Cigarettes    Quit date: 07/28/2011  . Smokeless tobacco: Never Used  . Alcohol Use: No    OB History    Grav Para Term Preterm Abortions TAB SAB Ect Mult Living   1 1 1       1       Review of Systems  Constitutional: Negative for fever and chills.  Gastrointestinal: Positive for nausea, vomiting and abdominal distention. Negative for diarrhea.  Genitourinary: Negative for dysuria and hematuria.  Musculoskeletal: Negative for back pain.    Allergies  Review of patient's allergies indicates no known allergies.  Home Medications   Current Outpatient Rx  Name Route Sig Dispense Refill  . CITALOPRAM HYDROBROMIDE 40 MG PO TABS Oral Take 40 mg by mouth every morning.    Marland Kitchen CLONAZEPAM 0.5 MG PO TBDP Oral Take 0.5 mg by mouth 3 (three) times daily.     Marland Kitchen NORGESTIM-ETH ESTRAD TRIPHASIC 0.18/0.215/0.25 MG-35 MCG PO TABS Oral Take 1 tablet by mouth at bedtime.    . OMEPRAZOLE 20 MG PO CPDR Oral Take 20 mg by mouth 2 (two) times daily. 1 po 30 minutes before breakfast AND SUPPER    . SULFAMETHOXAZOLE-TRIMETHOPRIM 800-160 MG PO TABS Oral Take 1 tablet by mouth every 12 (twelve) hours. 10 tablet 0  . TRAZODONE HCL 100 MG PO TABS Oral Take 100 mg by mouth at bedtime.    Marland Kitchen VALACYCLOVIR HCL 1 G PO TABS Oral Take 1,000 mg by mouth at bedtime.  Triage vitals: BP 126/74  Pulse 112  Temp 98.4 F (36.9 C) (Oral)  Resp 20  Ht 5\' 6"  (1.676 m)  Wt 298 lb (135.172 kg)  BMI 48.10 kg/m2  SpO2 100%  LMP 06/10/2012  Physical Exam  Nursing note and vitals reviewed. Constitutional: She is oriented to person, place, and time. She appears well-developed and well-nourished. No distress.  HENT:  Head: Normocephalic and atraumatic.  Eyes: EOM are normal.  Neck: Neck supple. No tracheal deviation present.  Cardiovascular: Normal rate and regular rhythm.   No murmur heard. Pulmonary/Chest: Effort normal and breath sounds normal. No respiratory distress.  Abdominal: Soft. There is tenderness (Tenderness to palpation in RLQ). There is no rebound and no guarding.       No CVA tenderness  Musculoskeletal:  Normal range of motion. She exhibits no edema.  Neurological: She is alert and oriented to person, place, and time.  Skin: Skin is warm and dry.  Psychiatric: She has a normal mood and affect. Her behavior is normal.    ED Course  Procedures (including critical care time)  DIAGNOSTIC STUDIES: Oxygen Saturation is 100% on room air, normal by my interpretation.    COORDINATION OF CARE: 7:26PM-Discussed treatment plan which includes blood work and urinalysis with pt at bedside and pt agreed to plan. 9:14PM-Pt rechecked and is still having pain. Informed pt of lab and radiology results. Discussed discharge plan of pain control with pt and pt agreed to plan.  Labs Reviewed  URINALYSIS, ROUTINE W REFLEX MICROSCOPIC - Abnormal; Notable for the following:    Hgb urine dipstick TRACE (*)     All other components within normal limits  URINE MICROSCOPIC-ADD ON - Abnormal; Notable for the following:    Squamous Epithelial / LPF FEW (*)     All other components within normal limits  PREGNANCY, URINE  CBC WITH DIFFERENTIAL  BASIC METABOLIC PANEL   Ct Abdomen Pelvis Wo Contrast  06/10/2012  *RADIOLOGY REPORT*  Clinical Data: Back pain  CT ABDOMEN AND PELVIS WITHOUT CONTRAST  Technique:  Multidetector CT imaging of the abdomen and pelvis was performed following the standard protocol without intravenous contrast.  Comparison: 11/25/2011  Findings: Stable mild splenomegaly.  Liver, gallbladder, pancreas, kidneys, adrenal glands are within normal limits.  No urinary calculus.  No ureteral calculus.  Normal appendix.  Sub centimeter short axis diameter mesenteric nodes are noted.  Bladder, uterus, and ovaries are within normal limits.  No destructive bone lesion.  IMPRESSION: Stable mild splenomegaly.  No evidence of urinary obstruction.  Original Report Authenticated By: Donavan Burnet, M.D.     No diagnosis found.    MDM  Patient has presented here for multiple painful conditions.  I am unable to  find a cause for this evening's symptoms.  Her urine, ct, and labs are all unremarkable.  She will be discharged with a limited number of percocet.  To return prn if worsens.          I personally performed the services described in this documentation, which was scribed in my presence. The recorded information has been reviewed and considered.      Geoffery Lyons, MD 06/10/12 2133

## 2012-06-10 NOTE — ED Notes (Signed)
States "pain was gone 1 minute after my shot."

## 2012-06-26 ENCOUNTER — Encounter (HOSPITAL_COMMUNITY): Payer: Self-pay | Admitting: *Deleted

## 2012-06-26 ENCOUNTER — Emergency Department (HOSPITAL_COMMUNITY)
Admission: EM | Admit: 2012-06-26 | Discharge: 2012-06-26 | Disposition: A | Discharge status: Home / Self Care | Payer: Medicaid Other | Attending: Emergency Medicine | Admitting: Emergency Medicine

## 2012-06-26 DIAGNOSIS — Z87891 Personal history of nicotine dependence: Secondary | ICD-10-CM | POA: Insufficient documentation

## 2012-06-26 DIAGNOSIS — K219 Gastro-esophageal reflux disease without esophagitis: Secondary | ICD-10-CM | POA: Insufficient documentation

## 2012-06-26 DIAGNOSIS — R21 Rash and other nonspecific skin eruption: Secondary | ICD-10-CM

## 2012-06-26 MED ORDER — DIPHENHYDRAMINE HCL 25 MG PO CAPS
50.0000 mg | ORAL_CAPSULE | Freq: Once | ORAL | Status: AC
  Administered 2012-06-26: 50 mg via ORAL
  Filled 2012-06-26: qty 2

## 2012-06-26 MED ORDER — PREDNISONE 10 MG PO TABS
60.0000 mg | ORAL_TABLET | Freq: Once | ORAL | Status: AC
  Administered 2012-06-26: 60 mg via ORAL
  Filled 2012-06-26: qty 6

## 2012-06-26 MED ORDER — PREDNISONE 10 MG PO TABS: ORAL_TABLET | ORAL | Status: DC

## 2012-06-26 NOTE — ED Provider Notes (Signed)
History     CSN: [id number]  Arrival date & time 06/26/12  1478   First MD Initiated Contact with Patient 06/26/12 1838      Chief Complaint  Patient presents with  . Rash    (Consider location/radiation/quality/duration/timing/severity/associated sxs/prior treatment) Patient is a 26 y.o. female presenting with rash. The history is provided by the patient.  Rash  This is a new problem. The current episode started yesterday. The problem has not changed since onset.The problem is associated with nothing. There has been no fever. The rash is present on the right upper leg, left upper leg, right arm, left hand and groin. The pain is at a severity of 8/10. The pain is moderate. The pain has been constant since onset. Associated symptoms include itching and pain. She has tried nothing for the symptoms. The treatment provided no relief.    Past Medical History  Diagnosis Date  . Herpes   . GERD (gastroesophageal reflux disease)   . Gastritis   . Disk prolapse   . Bulging disc     Past Surgical History  Procedure Date  . Wisdom tooth extraction   . Esophagogastroduodenoscopy 01/19/2012    ULCERS,MULTIPLE IN THE ANTRUM/HIATAL HERNIA/  . Bulging disc in back     Family History  Problem Relation Age of Onset  . Diabetes Mother   . Diabetes Other   . Hypertension Other     History  Substance Use Topics  . Smoking status: Former Smoker -- 3 years    Types: Cigarettes    Quit date: 07/28/2011  . Smokeless tobacco: Never Used  . Alcohol Use: No    OB History    Grav Para Term Preterm Abortions TAB SAB Ect Mult Living   1 1 1       1       Review of Systems  Constitutional: Negative for fever and chills.  HENT: Negative for facial swelling.   Respiratory: Negative for shortness of breath and wheezing.   Skin: Positive for itching and rash.  Neurological: Negative for numbness.    Allergies  Review of patient's allergies indicates no known allergies.  Home  Medications   Current Outpatient Rx  Name Route Sig Dispense Refill  . CITALOPRAM HYDROBROMIDE 40 MG PO TABS Oral Take 40 mg by mouth every morning.    Marland Kitchen CLONAZEPAM 0.5 MG PO TBDP Oral Take 0.5 mg by mouth 3 (three) times daily.     Marland Kitchen NORGESTIM-ETH ESTRAD TRIPHASIC 0.18/0.215/0.25 MG-35 MCG PO TABS Oral Take 1 tablet by mouth at bedtime.    . OMEPRAZOLE 20 MG PO CPDR Oral Take 20 mg by mouth 2 (two) times daily. 1 po 30 minutes before breakfast AND SUPPER    . PROMETHAZINE HCL 25 MG PO TABS Oral Take 0.5 tablets (12.5 mg total) by mouth every 6 (six) hours as needed for nausea. 10 tablet 0  . PROMETHAZINE HCL 25 MG PO TABS Oral Take 1 tablet (25 mg total) by mouth every 6 (six) hours as needed for nausea. 8 tablet 0  . PROMETHAZINE HCL 25 MG PO TABS Oral Take 1 tablet (25 mg total) by mouth every 6 (six) hours as needed for nausea. 10 tablet 0  . TRAZODONE HCL 100 MG PO TABS Oral Take 100 mg by mouth at bedtime.    Marland Kitchen VALACYCLOVIR HCL 1 G PO TABS Oral Take 1,000 mg by mouth at bedtime.       BP 118/57  Pulse 96  Temp 98.7 F (  37.1 C)  Resp 18  SpO2 99%  LMP 06/10/2012  Physical Exam  Constitutional: She appears well-developed and well-nourished. No distress.  HENT:  Head: Normocephalic.  Neck: Neck supple.  Cardiovascular: Normal rate.   Pulmonary/Chest: Effort normal. She has no wheezes.  Musculoskeletal: Normal range of motion. She exhibits no edema.  Skin: Rash noted. Rash is papular.       Tiny patches of near confluent papules medial thighs and forarms.  No pustules,  Vesicles, drainage or surrounding erythema.  Excoriations present.    ED Course  Procedures (including critical care time)  Labs Reviewed - No data to display No results found.   1. Rash       MDM  Rash appears consistent with contact dermatitis.  No sign of infectious source.  Pt was prescribed prednisone 6 day taper,  Encouraged benadryl and anti - itch cream.  Recheck by pcp if not improving over  the next several days.        Burgess Amor, [place] 06/26/12 [phone]

## 2012-06-26 NOTE — ED Notes (Signed)
Rash to body area that started yesterday, c/o itching,

## 2012-06-27 NOTE — ED Provider Notes (Signed)
Medical screening examination/treatment/procedure(s) were performed by non-physician practitioner and as supervising physician I was immediately available for consultation/collaboration.   Debria Broecker, MD 06/27/12 0009 

## 2012-07-11 ENCOUNTER — Emergency Department (HOSPITAL_COMMUNITY)
Admission: EM | Admit: 2012-07-11 | Discharge: 2012-07-11 | Disposition: A | Discharge status: Home / Self Care | Payer: Medicaid Other | Attending: Emergency Medicine | Admitting: Emergency Medicine

## 2012-07-11 ENCOUNTER — Encounter (HOSPITAL_COMMUNITY): Payer: Self-pay | Admitting: *Deleted

## 2012-07-11 DIAGNOSIS — M5430 Sciatica, unspecified side: Secondary | ICD-10-CM

## 2012-07-11 DIAGNOSIS — K219 Gastro-esophageal reflux disease without esophagitis: Secondary | ICD-10-CM | POA: Insufficient documentation

## 2012-07-11 DIAGNOSIS — M25559 Pain in unspecified hip: Secondary | ICD-10-CM | POA: Insufficient documentation

## 2012-07-11 DIAGNOSIS — IMO0002 Reserved for concepts with insufficient information to code with codable children: Secondary | ICD-10-CM | POA: Insufficient documentation

## 2012-07-11 DIAGNOSIS — Z87891 Personal history of nicotine dependence: Secondary | ICD-10-CM | POA: Insufficient documentation

## 2012-07-11 MED ORDER — DEXAMETHASONE SODIUM PHOSPHATE 4 MG/ML IJ SOLN
8.0000 mg | Freq: Once | INTRAMUSCULAR | Status: AC
  Administered 2012-07-11: 8 mg via INTRAMUSCULAR
  Filled 2012-07-11: qty 2

## 2012-07-11 MED ORDER — HYDROCODONE-ACETAMINOPHEN 5-325 MG PO TABS
2.0000 | ORAL_TABLET | Freq: Once | ORAL | Status: AC
  Administered 2012-07-11: 2 via ORAL
  Filled 2012-07-11: qty 2

## 2012-07-11 MED ORDER — ONDANSETRON HCL 4 MG PO TABS
4.0000 mg | ORAL_TABLET | Freq: Once | ORAL | Status: AC
  Administered 2012-07-11: 4 mg via ORAL
  Filled 2012-07-11: qty 1

## 2012-07-11 MED ORDER — METHOCARBAMOL 500 MG PO TABS: ORAL_TABLET | ORAL | Status: DC

## 2012-07-11 MED ORDER — DEXAMETHASONE 6 MG PO TABS: ORAL_TABLET | ORAL | Status: AC

## 2012-07-11 MED ORDER — HYDROCODONE-ACETAMINOPHEN 7.5-325 MG PO TABS: 1.0000 | ORAL_TABLET | ORAL | Status: AC | PRN

## 2012-07-11 MED ORDER — METHOCARBAMOL 500 MG PO TABS
1000.0000 mg | ORAL_TABLET | Freq: Once | ORAL | Status: AC
  Administered 2012-07-11: 1000 mg via ORAL
  Filled 2012-07-11: qty 2

## 2012-07-11 NOTE — ED Notes (Signed)
Pain RLQ ,onset 7pm , vomited x1,  Alert, Denies UTI sx.   Family at bedside.

## 2012-07-11 NOTE — ED Notes (Signed)
Pt c/o right sided hip pain. Pt states she was playing outside and pain hit her allo of a sudden.

## 2012-07-11 NOTE — ED Notes (Signed)
Family member to desk asking for pain med for pt.  Has not been examined, Loney Laurence , PA aware.

## 2012-07-11 NOTE — ED Provider Notes (Signed)
History     CSN: [id number]  Arrival date & time 07/11/12  2026   First MD Initiated Contact with Patient 07/11/12 2215      Chief Complaint  Patient presents with  . Hip Pain    (Consider location/radiation/quality/duration/timing/severity/associated sxs/prior treatment) Patient is a 26 y.o. female presenting with hip pain. The history is provided by the patient.  Hip Pain This is a new problem. The current episode started today. The problem occurs constantly. The problem has been unchanged. Associated symptoms include nausea. Pertinent negatives include no abdominal pain, arthralgias, chest pain, coughing or neck pain. The symptoms are aggravated by standing, twisting and walking. She has tried nothing for the symptoms.    Past Medical History  Diagnosis Date  . Herpes   . GERD (gastroesophageal reflux disease)   . Gastritis   . Disk prolapse   . Bulging disc     Past Surgical History  Procedure Date  . Wisdom tooth extraction   . Esophagogastroduodenoscopy 01/19/2012    ULCERS,MULTIPLE IN THE ANTRUM/HIATAL HERNIA/  . Bulging disc in back     Family History  Problem Relation Age of Onset  . Diabetes Mother   . Diabetes Other   . Hypertension Other     History  Substance Use Topics  . Smoking status: Former Smoker -- 3 years    Types: Cigarettes    Quit date: 07/28/2011  . Smokeless tobacco: Never Used  . Alcohol Use: No    OB History    Grav Para Term Preterm Abortions TAB SAB Ect Mult Living   1 1 1       1       Review of Systems  Constitutional: Negative for activity change.       All ROS Neg except as noted in HPI  HENT: Negative for nosebleeds and neck pain.   Eyes: Negative for photophobia and discharge.  Respiratory: Negative for cough, shortness of breath and wheezing.   Cardiovascular: Negative for chest pain and palpitations.  Gastrointestinal: Positive for nausea. Negative for abdominal pain and blood in stool.  Genitourinary: Negative for  dysuria, frequency and hematuria.  Musculoskeletal: Positive for back pain. Negative for arthralgias.  Skin: Negative.   Neurological: Negative for dizziness, seizures and speech difficulty.  Psychiatric/Behavioral: Negative for hallucinations and confusion. The patient is nervous/anxious.     Allergies  Review of patient's allergies indicates no known allergies.  Home Medications   Current Outpatient Rx  Name Route Sig Dispense Refill  . CITALOPRAM HYDROBROMIDE 40 MG PO TABS Oral Take 40 mg by mouth every morning.    Marland Kitchen CLONAZEPAM 0.5 MG PO TBDP Oral Take 0.5 mg by mouth 3 (three) times daily.     Marland Kitchen NORGESTIM-ETH ESTRAD TRIPHASIC 0.18/0.215/0.25 MG-35 MCG PO TABS Oral Take 1 tablet by mouth at bedtime.    . OMEPRAZOLE 20 MG PO CPDR Oral Take 20 mg by mouth 2 (two) times daily. 1 po 30 minutes before breakfast AND SUPPER    . TRAZODONE HCL 100 MG PO TABS Oral Take 100 mg by mouth at bedtime.    Marland Kitchen VALACYCLOVIR HCL 1 G PO TABS Oral Take 1,000 mg by mouth at bedtime.       BP 134/71  Pulse 102  Temp 98.8 F (37.1 C) (Oral)  Resp 20  Ht 5\' 8"  (1.727 m)  Wt 306 lb (138.801 kg)  BMI 46.53 kg/m2  SpO2 98%  LMP 07/04/2012  Physical Exam  Nursing note and vitals reviewed. Constitutional:  She is oriented to person, place, and time. She appears well-developed and well-nourished.  Non-toxic appearance.  HENT:  Head: Normocephalic.  Right Ear: Tympanic membrane and external ear normal.  Left Ear: Tympanic membrane and external ear normal.  Eyes: EOM and lids are normal. Pupils are equal, round, and reactive to light.  Neck: Normal range of motion. Neck supple. Carotid bruit is not present.  Cardiovascular: Regular rhythm, normal heart sounds, intact distal pulses and normal pulses.  Tachycardia present.   Pulmonary/Chest: Breath sounds normal. No respiratory distress.  Abdominal: Soft. Bowel sounds are normal. There is no tenderness. There is no guarding.  Musculoskeletal:       There  is pain to palpation over the right paraspinal area. There is pain to percussion over the lower lumbar spine area. There is pain with attempted range of motion of the right hip and worse pain with right straight leg raise. The distal pulses on the right and left are symmetrical. Sensory is intact. There is good capillary refill.  Lymphadenopathy:       Head (right side): No submandibular adenopathy present.       Head (left side): No submandibular adenopathy present.    She has no cervical adenopathy.  Neurological: She is alert and oriented to person, place, and time. She has normal strength. No cranial nerve deficit or sensory deficit.  Skin: Skin is warm and dry.  Psychiatric: She has a normal mood and affect. Her speech is normal.    ED Course  Procedures (including critical care time)  Labs Reviewed - No data to display No results found.   No diagnosis found.    MDM  I have reviewed nursing notes, vital signs, and all appropriate lab and imaging results for this patient. Patient's examination is consistent with sciatica. Patient has a history of degenerative disc disease problems of the back. Prescription for dexamethasone daily, Robaxin 3 times daily, and Norco every 4 hours #20 tablets given to the patient. Patient strongly advised to see her primary physician for additional evaluation and possible MRI of her back.       Kathie Dike, [place] 07/11/12 2238

## 2012-07-12 NOTE — ED Provider Notes (Signed)
Medical screening examination/treatment/procedure(s) were performed by non-physician practitioner and as supervising physician I was immediately available for consultation/collaboration.   Danaya Geddis III, MD 07/12/12 1153 

## 2012-07-14 ENCOUNTER — Ambulatory Visit (HOSPITAL_COMMUNITY)
Admission: RE | Admit: 2012-07-14 | Discharge: 2012-07-14 | Disposition: A | Discharge status: Home / Self Care | Payer: Medicaid Other | Source: Ambulatory Visit | Attending: Anesthesiology | Admitting: Anesthesiology

## 2012-07-14 DIAGNOSIS — M545 Low back pain, unspecified: Secondary | ICD-10-CM | POA: Insufficient documentation

## 2012-07-14 DIAGNOSIS — M5416 Radiculopathy, lumbar region: Secondary | ICD-10-CM | POA: Insufficient documentation

## 2012-07-14 DIAGNOSIS — M6281 Muscle weakness (generalized): Secondary | ICD-10-CM | POA: Insufficient documentation

## 2012-07-14 DIAGNOSIS — IMO0001 Reserved for inherently not codable concepts without codable children: Secondary | ICD-10-CM | POA: Insufficient documentation

## 2012-07-14 NOTE — Evaluation (Signed)
Physical Therapy MEDICAID Evaluation  Patient Details  Name: [name] MRN: [id number] Date of Birth: Jul 16, 1986  Today's Date: 07/14/2012 Time: 4098-1191 PT Time Calculation (min): 36 min Charges: 1 eval Visit#: 1  of 8   Re-eval: 08/13/12 Assessment Diagnosis: LBP w/radiculopathy Next MD Visit: Dr. Norville Haggard  Authorization: MEDICAID  Authorization Time Period: submitted request  Authorization Visit#: 0  of 3    Past Medical History:  Past Medical History  Diagnosis Date  . Herpes   . GERD (gastroesophageal reflux disease)   . Gastritis   . Disk prolapse   . Bulging disc    Past Surgical History:  Past Surgical History  Procedure Date  . Wisdom tooth extraction   . Esophagogastroduodenoscopy 01/19/2012    ULCERS,MULTIPLE IN THE ANTRUM/HIATAL HERNIA/  . Bulging disc in back     Subjective Symptoms/Limitations Symptoms: see medicaid eval Pain Assessment Currently in Pain?: Yes Pain Score:   6 Pain Location: Back  Exercise/Treatments Stretches Active Hamstring Stretch: 1 rep;30 seconds (BLE) Press Ups: Limitations Press Ups Limitations: 2x10 reps w/overpressure to lumbar spine Piriformis Stretch: 1 rep;30 seconds (RLE, figure 4 position) Seated Other Seated Lumbar Exercises: Heel and Toe Roll in and outs 3x10 sec holds Supine Ab Set: Limitations AB Set Limitations: NMR for TrA contraction Bridge: 10 reps Prone  Other Prone Lumbar Exercises: NMR for multifidus and PF contraction  Physical Therapy Assessment and Plan PT Assessment and Plan Clinical Impression Statement: see medicaid eval PT Plan: If pt has radicular symptoms start with press up w/over pressure.  manual therapy for R piriformis spasms, MET if necessary to SI, continue with focus on core coordination and strengthening in order to ensure appropriate HEP.     Goals Home Exercise Program Pt will Perform Home Exercise Program: Independently PT Goal: Perform Home Exercise Program - Progress:  Goal set today PT Short Term Goals Time to Complete Short Term Goals: 4 weeks PT Long Term Goals Time to Complete Long Term Goals: 4 weeks PT Long Term Goal 1:  Pt will demonstrate appropriate coordinated movements with core muscles in order to improve postural strength.  PT Long Term Goal 2: Pt will reports pain less than 3/10 for improved QOL w/o radicular pain to RLE  Long Term Goal 3: Pt will improve her ODI to less than or equal to 30% for improved QOL   Problem List Patient Active Problem List  Diagnosis  . KNEE PAIN  . CONTUSION, LEFT KNEE  . Nausea and vomiting  . GERD (gastroesophageal reflux disease)  . Lumbar pain  . Lumbar radiculopathy    INITIAL EVALUATION  Physical Therapy   Patient Name: [name] Date Of Birth: 17-Jan-1986 Guardian Name: N/A Treatment ICD-9 Code: 7244 Address: [address] Date of Evaluation: 07/14/2012 [place], [place] [address] Requested Dates of Service: 07/14/2012 - 08/11/2012 Therapy History: No known therapy for this problem Reason For Referral: Recipient has a new injury, disease or condition Prior Level of Function: Independent/Modified Independent with all ADLs (OT/PT) or Audition, Communication, Voice and/or Swallowing Skills (ST/AUD) Additional Medical History: Pt is referred to PT for lumbar radiculopathy which started when she was playing with her 24 year old and she noticed her R side of her back hurting. She was referred to see Dr. Ollen Bowl and had 2 injections which did not work. She was off for 2 months for her back pain, but has since returned with continued pain. She works for the [employer] 4 days a week.  Objective Worst pain with R SB. Lumbar ROM: flexion: decreased 25%, All others WNL w/pain. Impaired coordination to multifidus, PF and TrA musculature with significant compensations, however even contraction. Radicular pain to back of knee - centralized to low back with prone press ups and over pressure. Increased muscle spasms to R  piriformis region. Prematurity: N/A Severity Level: N/A Treatment Goals:  1. Goal: Independentl with HEP  Baseline: Educaction only  Duration: 2 Week(s)  2. Goal: Pt will demonstrate appropriate coordinated movements with core muscles in order to improve postural strength.  Baseline: Uncoordinated movements w/significant muscle substitutions  Duration: 4 Week(s)  3. Goal: Pt will reports pain less than 3/10 for improved QOL w/o radicular pain to RLE  Baseline: Pain: 6/10  Duration: 4 Week(s)  4. Goal: Pt will improve her ODI to less than or equal to 30% for improved QOL  Baseline: Currently 58%  Duration: 4 Week(s)  5. Goal: Pt will be instructed on proper body mechanics in order to safely perform job duties at work.  Baseline: inappropriate squatting and bending technique  Duration: 4 Week(s)  Treatment Frequency/Duration: 2x/week for 4 weeks  Units per visit: N/A  Additional Information: [name] is a 26 year old female referred to PT for LBP w/radiculopathy. After treatment today pt stated pain 3/10 and w/o radicular symtoms w/extension techniques. Pt will benefit from skilled OP PT in order to address LBP, radicular symptoms, impaired core coordination, lack of education on proper lifting techniques and increased muscular spasms in order to improve her QOL. Plan: Ther-ex, manual techniques (McKenzie), pt education, NMR.     Therapist Signature  Date   Physician Signature  Date  Annett Fabian  Therapist Name     Physician Name   Annett Fabian, PT 07/14/2012, 4:36 PM  Physician Documentation Your signature is required to indicate approval of the treatment plan as stated above.  Please sign and either send electronically or make a copy of this report for your files and return this physician signed original.   Please mark one 1.__approve of plan  2. ___approve of plan with the following conditions.   ______________________________                                                           _____________________ Physician Signature                                                                                                             Date

## 2012-07-21 ENCOUNTER — Ambulatory Visit (HOSPITAL_COMMUNITY)
Admission: RE | Admit: 2012-07-21 | Discharge: 2012-07-21 | Disposition: A | Discharge status: Home / Self Care | Payer: Medicaid Other | Source: Ambulatory Visit | Attending: Family Medicine | Admitting: Family Medicine

## 2012-07-21 DIAGNOSIS — M6281 Muscle weakness (generalized): Secondary | ICD-10-CM | POA: Insufficient documentation

## 2012-07-21 DIAGNOSIS — M545 Low back pain, unspecified: Secondary | ICD-10-CM | POA: Insufficient documentation

## 2012-07-21 DIAGNOSIS — IMO0001 Reserved for inherently not codable concepts without codable children: Secondary | ICD-10-CM | POA: Insufficient documentation

## 2012-07-21 NOTE — Progress Notes (Signed)
Physical Therapy Treatment Patient Details  Name: [name] MRN: [id number] Date of Birth: Sep 26, 1986  Today's Date: 07/21/2012 Time: 4098-1191 PT Time Calculation (min): 44 min  Visit#: 2  of 8   Re-eval: 08/13/12  Charge: therex 34', manual 10'  Authorization: MEDICAID  Authorization Time Period: still awaiting approval, check next session  Authorization Visit#:   of 3    Subjective: Symptoms/Limitations Symptoms: Pt reported LBP 8/10 with radicular pain down R LE.  Compliance with HEP Pain Assessment Currently in Pain?: Yes Pain Score:   8 Pain Location: Back Pain Orientation: Right;Lower Pain Radiating Towards: Radicular pain down R LE  Objective:   Exercise/Treatments Stretches Active Hamstring Stretch: 2 reps;30 seconds Press Ups: Limitations Press Ups Limitations: 2x10 reps w/overpressure to lumbar spine Piriformis Stretch: 1 rep;30 seconds;2 reps;Limitations Piriformis Stretch Limitations: 1 reps all 4s; supine 2 reps  Seated Other Seated Lumbar Exercises: NMR for PFC and TrA 5x 10" Supine Bent Knee Raise: 10 reps;5 seconds Bridge: 10 reps Prone  Other Prone Lumbar Exercises: NMR for multifidus and PF contraction  Manual Therapy Manual Therapy: Other (comment) Other Manual Therapy: R anterior rotation MET with treadmill gait training following.   Physical Therapy Assessment and Plan PT Assessment and Plan Clinical Impression Statement: R SI anterior rotation, MET complete with pain reduced, increase lumbar ROM and improved gait mechanics.  Pt able to demonstration most HEP exercises correctly but did require tactile cueing for correct musculature for PFC and TrA contraction.  Pt stated pain decreased to 1/10. PT Plan: If pt has radicular symptoms start with press up w/over pressure. manual therapy for R piriformis spasms, MET if necessary to SI, continue with focus on core coordination and strengthening in order to ensure appropriate HEP    Goals     Problem List Patient Active Problem List  Diagnosis  . KNEE PAIN  . CONTUSION, LEFT KNEE  . Nausea and vomiting  . GERD (gastroesophageal reflux disease)  . Lumbar pain  . Lumbar radiculopathy    PT - End of Session Activity Tolerance: Patient tolerated treatment well General Behavior During Session: [facility] for tasks performed Cognition: [facility] for tasks performed  GP    Juel Burrow 07/21/2012, 5:04 PM

## 2012-07-28 ENCOUNTER — Ambulatory Visit (HOSPITAL_COMMUNITY): Payer: Medicaid Other | Admitting: Physical Therapy

## 2012-08-02 ENCOUNTER — Emergency Department (HOSPITAL_COMMUNITY)
Admission: EM | Admit: 2012-08-02 | Discharge: 2012-08-02 | Disposition: A | Discharge status: Home / Self Care | Payer: Medicaid Other | Attending: Emergency Medicine | Admitting: Emergency Medicine

## 2012-08-02 ENCOUNTER — Encounter (HOSPITAL_COMMUNITY): Payer: Self-pay | Admitting: *Deleted

## 2012-08-02 DIAGNOSIS — X58XXXA Exposure to other specified factors, initial encounter: Secondary | ICD-10-CM | POA: Insufficient documentation

## 2012-08-02 DIAGNOSIS — Z87891 Personal history of nicotine dependence: Secondary | ICD-10-CM | POA: Insufficient documentation

## 2012-08-02 DIAGNOSIS — K2990 Gastroduodenitis, unspecified, without bleeding: Secondary | ICD-10-CM | POA: Insufficient documentation

## 2012-08-02 DIAGNOSIS — R21 Rash and other nonspecific skin eruption: Secondary | ICD-10-CM | POA: Insufficient documentation

## 2012-08-02 DIAGNOSIS — Z79899 Other long term (current) drug therapy: Secondary | ICD-10-CM | POA: Insufficient documentation

## 2012-08-02 DIAGNOSIS — T7840XA Allergy, unspecified, initial encounter: Secondary | ICD-10-CM

## 2012-08-02 DIAGNOSIS — K2970 Gastritis, unspecified, without bleeding: Secondary | ICD-10-CM | POA: Insufficient documentation

## 2012-08-02 DIAGNOSIS — K219 Gastro-esophageal reflux disease without esophagitis: Secondary | ICD-10-CM | POA: Insufficient documentation

## 2012-08-02 MED ORDER — PREDNISONE 20 MG PO TABS
60.0000 mg | ORAL_TABLET | Freq: Once | ORAL | Status: AC
  Administered 2012-08-02: 60 mg via ORAL
  Filled 2012-08-02: qty 3

## 2012-08-02 MED ORDER — FAMOTIDINE 20 MG PO TABS
20.0000 mg | ORAL_TABLET | Freq: Once | ORAL | Status: AC
  Administered 2012-08-02: 20 mg via ORAL
  Filled 2012-08-02: qty 1

## 2012-08-02 MED ORDER — PREDNISONE 50 MG PO TABS: 50.0000 mg | ORAL_TABLET | Freq: Every day | ORAL | Status: DC

## 2012-08-02 MED ORDER — DIPHENHYDRAMINE HCL 25 MG PO CAPS
50.0000 mg | ORAL_CAPSULE | Freq: Once | ORAL | Status: AC
  Administered 2012-08-02: 50 mg via ORAL
  Filled 2012-08-02: qty 2

## 2012-08-02 NOTE — ED Notes (Signed)
C/o rash to body area, started a week ago then  started spreading about three days ago, c/o pain and itching.

## 2012-08-02 NOTE — ED Notes (Signed)
Pt c/o rash to back, abdomen, and thighs bilaterally. Pt states she first noticed rash on back 1 week ago and the rash has been showing on abdomen and legs for 2-3 days. Pt c/o pain and itching in areas of rash.

## 2012-08-02 NOTE — ED Provider Notes (Signed)
History     CSN: [id number]  Arrival date & time 08/02/12  1420   First MD Initiated Contact with Patient 08/02/12 1608      Chief Complaint  Patient presents with  . Rash    (Consider location/radiation/quality/duration/timing/severity/associated sxs/prior treatment) HPI Comments: Broke out in hives initially on back and then on abdomen and both thighs.  No identifiable allergen exposure.  + itching but no diff breathing or swallowing.  No fever or chills.  Several "bumps on by tongue and the corners of my mouth".  Applying benadryl cream with no effect.  Saw dr. Renard Matter, her PCP, several days ago and he thought "it is an allergic reaction" but did not outline a course of treatment.  The history is provided by the patient. No language interpreter was used.    Past Medical History  Diagnosis Date  . Herpes   . GERD (gastroesophageal reflux disease)   . Gastritis   . Disk prolapse   . Bulging disc     Past Surgical History  Procedure Date  . Wisdom tooth extraction   . Esophagogastroduodenoscopy 01/19/2012    ULCERS,MULTIPLE IN THE ANTRUM/HIATAL HERNIA/  . Bulging disc in back     Family History  Problem Relation Age of Onset  . Diabetes Mother   . Diabetes Other   . Hypertension Other     History  Substance Use Topics  . Smoking status: Former Smoker -- 3 years    Types: Cigarettes    Quit date: 07/28/2011  . Smokeless tobacco: Never Used  . Alcohol Use: No    OB History    Grav Para Term Preterm Abortions TAB SAB Ect Mult Living   1 1 1       1       Review of Systems  Constitutional: Negative for fever and chills.  HENT: Negative for sore throat, trouble swallowing and voice change.   Respiratory: Negative for cough, shortness of breath and wheezing.   Cardiovascular: Negative for chest pain.  Skin: Positive for rash.  All other systems reviewed and are negative.    Allergies  Review of patient's allergies indicates no known allergies.  Home  Medications   Current Outpatient Rx  Name Route Sig Dispense Refill  . CITALOPRAM HYDROBROMIDE 40 MG PO TABS Oral Take 40 mg by mouth every morning.    Marland Kitchen CLONAZEPAM 0.5 MG PO TBDP Oral Take 0.5 mg by mouth 3 (three) times daily.     Marland Kitchen METHOCARBAMOL 500 MG PO TABS  2 po tid for spasm 30 tablet 0  . NORGESTIM-ETH ESTRAD TRIPHASIC 0.18/0.215/0.25 MG-35 MCG PO TABS Oral Take 1 tablet by mouth at bedtime.    . OMEPRAZOLE 20 MG PO CPDR Oral Take 20 mg by mouth 2 (two) times daily. 1 po 30 minutes before breakfast AND SUPPER    . PREDNISONE 50 MG PO TABS Oral Take 1 tablet (50 mg total) by mouth daily. 6 tablet 0  . TRAZODONE HCL 100 MG PO TABS Oral Take 100 mg by mouth at bedtime.    Marland Kitchen VALACYCLOVIR HCL 1 G PO TABS Oral Take 1,000 mg by mouth at bedtime.       BP 135/65  Pulse 100  Temp 98.3 F (36.8 C)  Resp 20  Ht 5\' 6"  (1.676 m)  Wt 308 lb (139.708 kg)  BMI 49.71 kg/m2  SpO2 97%  LMP 07/04/2012  Physical Exam  Nursing note and vitals reviewed. Constitutional: She is oriented to person, place, and  time. She appears well-developed and well-nourished. No distress.  HENT:  Head: Normocephalic and atraumatic.  Eyes: EOM are normal.  Neck: Normal range of motion.  Cardiovascular: Normal rate, regular rhythm and normal heart sounds.   Pulmonary/Chest: Effort normal and breath sounds normal. No accessory muscle usage. Not tachypneic. No respiratory distress. She has no wheezes. She has no rales. She exhibits no tenderness.  Abdominal: Soft. She exhibits no distension. There is no tenderness.  Musculoskeletal: Normal range of motion. She exhibits no edema.  Neurological: She is alert and oriented to person, place, and time. Coordination normal.  Skin: Skin is warm and dry. Rash noted. Rash is urticarial. There is erythema.       Urticaria scattered over back, lower abdomen and B thighs c/w allergic response.  Psychiatric: She has a normal mood and affect. Judgment normal.    ED Course    Procedures (including critical care time)  Labs Reviewed - No data to display No results found.   1. Allergic reaction       MDM  rx-prednisone 50 mg, 6 Benadryl 50 mg QID pepcid 20 mg BID F/u with mcinnis         Evalina Field, PA 08/02/12 1634

## 2012-08-03 NOTE — ED Provider Notes (Signed)
Medical screening examination/treatment/procedure(s) were performed by non-physician practitioner and as supervising physician I was immediately available for consultation/collaboration  Alayah Knouff R. Khori Underberg, MD 08/03/12 0023 

## 2012-08-04 ENCOUNTER — Inpatient Hospital Stay (HOSPITAL_COMMUNITY)
Admission: RE | Admit: 2012-08-04 | Discharge: 2012-08-04 | Payer: Medicaid Other | Source: Ambulatory Visit | Attending: Physical Therapy | Admitting: Physical Therapy

## 2012-09-19 ENCOUNTER — Ambulatory Visit: Payer: Medicaid Other | Attending: Neurology | Admitting: Sleep Medicine

## 2012-09-19 DIAGNOSIS — G4730 Sleep apnea, unspecified: Secondary | ICD-10-CM

## 2012-09-19 DIAGNOSIS — G4710 Hypersomnia, unspecified: Secondary | ICD-10-CM | POA: Insufficient documentation

## 2012-09-20 NOTE — Procedures (Signed)
[facility] [name], MD     [email or web address]         LOCATION: SLEEP LAB FACILITY: [facility]  PHYSICIAN: Robson Trickey A. Gerilyn Pilgrim, M.D.   DATE OF STUDY: 09/19/2012  NOCTURNAL POLYSOMNOGRAM   REFERRING PHYSICIAN: Walker Paddack A. Gerilyn Pilgrim, M.D.  INDICATIONS: The patient is a 26 year old presents with marked hypersomnia, fatigue and snoring.  MEDICATIONS: Trazodone, Flexeril, Norco and Cymbalta.   EPWORTH SLEEPINESS SCALE: 15.   BMI: 53.   ARCHITECTURAL SUMMARY: Total recording time was 4-28 minutes. Sleep efficiency 82%. Sleep latency 49 minutes and REM latency 0 minutes. Stage N2%, N2 60%, and N3 39% and REM sleep 0%.  RESPIRATORY DATA:  Baseline oxygen saturation is 97%. The lowest saturation was 88% during non-REM sleep. The diagnostic AHI is 0.3 and RDI also 0.3.  LIMB MOVEMENT SUMMARY: PLM index 0.   ELECTROCARDIOGRAM SUMMARY: Average heart rate is 87  with no significant  dysrhythmias observed.   IMPRESSION: This recording shows abnormal sleep architecture with no REM sleep and increase slow-wave sleep. The absence of REM sleep can be seen with powerful suppressants such as antidepressants and other medications. Given the persistent hypersomnia, we may want to consider additional testing such as MSLT.  Shanley Furlough A. Gerilyn Pilgrim, M.D. Diplomat, Biomedical engineer of Sleep Medicine.

## 2012-09-28 ENCOUNTER — Emergency Department (HOSPITAL_COMMUNITY): Payer: Medicaid Other

## 2012-09-28 ENCOUNTER — Emergency Department (HOSPITAL_COMMUNITY)
Admission: EM | Admit: 2012-09-28 | Discharge: 2012-09-28 | Disposition: A | Discharge status: Home / Self Care | Payer: Medicaid Other | Attending: Emergency Medicine | Admitting: Emergency Medicine

## 2012-09-28 ENCOUNTER — Encounter (HOSPITAL_COMMUNITY): Payer: Self-pay | Admitting: *Deleted

## 2012-09-28 DIAGNOSIS — Z79899 Other long term (current) drug therapy: Secondary | ICD-10-CM | POA: Insufficient documentation

## 2012-09-28 DIAGNOSIS — F411 Generalized anxiety disorder: Secondary | ICD-10-CM | POA: Insufficient documentation

## 2012-09-28 DIAGNOSIS — R079 Chest pain, unspecified: Secondary | ICD-10-CM

## 2012-09-28 DIAGNOSIS — Z8719 Personal history of other diseases of the digestive system: Secondary | ICD-10-CM | POA: Insufficient documentation

## 2012-09-28 DIAGNOSIS — K219 Gastro-esophageal reflux disease without esophagitis: Secondary | ICD-10-CM | POA: Insufficient documentation

## 2012-09-28 DIAGNOSIS — F419 Anxiety disorder, unspecified: Secondary | ICD-10-CM

## 2012-09-28 DIAGNOSIS — Z87891 Personal history of nicotine dependence: Secondary | ICD-10-CM | POA: Insufficient documentation

## 2012-09-28 MED ORDER — IBUPROFEN 800 MG PO TABS: ORAL_TABLET | ORAL | Status: DC

## 2012-09-28 MED ORDER — IBUPROFEN 800 MG PO TABS: 800.0000 mg | ORAL_TABLET | Freq: Three times a day (TID) | ORAL | Status: DC | PRN

## 2012-09-28 MED ORDER — HYDROCODONE-ACETAMINOPHEN 5-325 MG PO TABS
1.0000 | ORAL_TABLET | Freq: Once | ORAL | Status: AC
  Administered 2012-09-28: 1 via ORAL
  Filled 2012-09-28: qty 1

## 2012-09-28 NOTE — ED Notes (Signed)
Chest pain, onset after argument with boyfriend.

## 2012-09-28 NOTE — ED Notes (Signed)
Pt c/o pain in chest since having an argument with her boyfriend this morning.  Denies any other symptoms.

## 2012-09-28 NOTE — ED Provider Notes (Signed)
History     CSN: [id number]  Arrival date & time 09/28/12  1454   First MD Initiated Contact with Patient 09/28/12 1517      Chief Complaint  Patient presents with  . Chest Pain    (Consider location/radiation/quality/duration/timing/severity/associated sxs/prior treatment) Patient is a 26 y.o. female presenting with chest pain. The history is provided by the patient (pt complains of chest pain after arguing with her boyfriend). No language interpreter was used.  Chest Pain The chest pain began 3 - 5 hours ago. Chest pain occurs constantly. The chest pain is unchanged. Associated with: nothing. At its most intense, the pain is at 5/10. The pain is currently at 3/10. The severity of the pain is moderate. The quality of the pain is described as aching. The pain does not radiate. Pertinent negatives for primary symptoms include no fatigue, no cough and no abdominal pain.  Pertinent negatives for past medical history include no seizures.     Past Medical History  Diagnosis Date  . Herpes   . GERD (gastroesophageal reflux disease)   . Gastritis   . Disk prolapse   . Bulging disc     Past Surgical History  Procedure Date  . Wisdom tooth extraction   . Esophagogastroduodenoscopy 01/19/2012    ULCERS,MULTIPLE IN THE ANTRUM/HIATAL HERNIA/  . Bulging disc in back     Family History  Problem Relation Age of Onset  . Diabetes Mother   . Diabetes Other   . Hypertension Other     History  Substance Use Topics  . Smoking status: Former Smoker -- 3 years    Types: Cigarettes    Quit date: 07/28/2011  . Smokeless tobacco: Never Used  . Alcohol Use: No    OB History    Grav Para Term Preterm Abortions TAB SAB Ect Mult Living   1 1 1       1       Review of Systems  Constitutional: Negative for fatigue.  HENT: Negative for congestion, sinus pressure and ear discharge.   Eyes: Negative for discharge.  Respiratory: Negative for cough.   Cardiovascular: Positive for chest  pain.  Gastrointestinal: Negative for abdominal pain and diarrhea.  Genitourinary: Negative for frequency and hematuria.  Musculoskeletal: Negative for back pain.  Skin: Negative for rash.  Neurological: Negative for seizures and headaches.  Hematological: Negative.   Psychiatric/Behavioral: Negative for hallucinations.    Allergies  Review of patient's allergies indicates no known allergies.  Home Medications   Current Outpatient Rx  Name  Route  Sig  Dispense  Refill  . CITALOPRAM HYDROBROMIDE 40 MG PO TABS   Oral   Take 40 mg by mouth every morning.         Marland Kitchen CLONAZEPAM 0.5 MG PO TBDP   Oral   Take 0.5 mg by mouth 3 (three) times daily.          . IBUPROFEN 800 MG PO TABS      Take one every 8 hours for pain   15 tablet   0   . IBUPROFEN 800 MG PO TABS   Oral   Take 1 tablet (800 mg total) by mouth every 8 (eight) hours as needed for pain.   21 tablet   0   . METHOCARBAMOL 500 MG PO TABS      2 po tid for spasm   30 tablet   0   . NORGESTIM-ETH ESTRAD TRIPHASIC 0.18/0.215/0.25 MG-35 MCG PO TABS   Oral  Take 1 tablet by mouth at bedtime.         . OMEPRAZOLE 20 MG PO CPDR   Oral   Take 20 mg by mouth 2 (two) times daily. 1 po 30 minutes before breakfast AND SUPPER         . PREDNISONE 50 MG PO TABS   Oral   Take 1 tablet (50 mg total) by mouth daily.   6 tablet   0   . TRAZODONE HCL 100 MG PO TABS   Oral   Take 100 mg by mouth at bedtime.         Marland Kitchen VALACYCLOVIR HCL 1 G PO TABS   Oral   Take 1,000 mg by mouth at bedtime.            BP 104/59  Pulse 88  Temp 98.4 F (36.9 C) (Oral)  Resp 19  Ht 5\' 6"  (1.676 m)  Wt 318 lb (144.244 kg)  BMI 51.33 kg/m2  SpO2 99%  LMP 09/22/2012  Physical Exam  Constitutional: She is oriented to person, place, and time. She appears well-developed.  HENT:  Head: Normocephalic and atraumatic.  Eyes: Conjunctivae normal and EOM are normal. No scleral icterus.  Neck: Neck supple. No thyromegaly  present.  Cardiovascular: Normal rate and regular rhythm.  Exam reveals no gallop and no friction rub.   No murmur heard. Pulmonary/Chest: No stridor. She has no wheezes. She has no rales. She exhibits no tenderness.  Abdominal: She exhibits no distension. There is no tenderness. There is no rebound.  Musculoskeletal: Normal range of motion. She exhibits no edema.  Lymphadenopathy:    She has no cervical adenopathy.  Neurological: She is oriented to person, place, and time. Coordination normal.  Skin: No rash noted. No erythema.  Psychiatric: She has a normal mood and affect. Her behavior is normal.    ED Course  Procedures (including critical care time)  Labs Reviewed - No data to display Dg Chest 2 View  09/28/2012  *RADIOLOGY REPORT*  Clinical Data: Chest pain, former smoker  CHEST - 2 VIEW  Comparison: 03/18/2012  Findings: Normal heart size, mediastinal contours, and pulmonary vascularity. Minimal chronic peribronchial thickening. Lungs clear. Bones unremarkable. No pneumothorax.  IMPRESSION: No acute abnormalities.   Original Report Authenticated By: Ulyses Southward, M.D.      1. Chest pain   2. Anxiety     Date: 09/28/2012  Rate:106  Rhythm: sinus tachycardia  QRS Axis: normal  Intervals: normal  ST/T Wave abnormalities: normal  Conduction Disutrbances:none  Narrative Interpretation:   Old EKG Reviewed: none available     MDM          [name], MD 09/28/12 [id number]

## 2012-10-19 ENCOUNTER — Encounter (HOSPITAL_COMMUNITY): Payer: Self-pay | Admitting: *Deleted

## 2012-10-19 ENCOUNTER — Emergency Department (HOSPITAL_COMMUNITY)
Admission: EM | Admit: 2012-10-19 | Discharge: 2012-10-19 | Disposition: A | Discharge status: Home / Self Care | Payer: MEDICAID | Attending: Emergency Medicine | Admitting: Emergency Medicine

## 2012-10-19 DIAGNOSIS — Z87891 Personal history of nicotine dependence: Secondary | ICD-10-CM | POA: Insufficient documentation

## 2012-10-19 DIAGNOSIS — R42 Dizziness and giddiness: Secondary | ICD-10-CM | POA: Insufficient documentation

## 2012-10-19 DIAGNOSIS — R5381 Other malaise: Secondary | ICD-10-CM | POA: Insufficient documentation

## 2012-10-19 DIAGNOSIS — Z79899 Other long term (current) drug therapy: Secondary | ICD-10-CM | POA: Insufficient documentation

## 2012-10-19 DIAGNOSIS — R197 Diarrhea, unspecified: Secondary | ICD-10-CM | POA: Insufficient documentation

## 2012-10-19 DIAGNOSIS — R509 Fever, unspecified: Secondary | ICD-10-CM | POA: Insufficient documentation

## 2012-10-19 DIAGNOSIS — F19939 Other psychoactive substance use, unspecified with withdrawal, unspecified: Secondary | ICD-10-CM | POA: Insufficient documentation

## 2012-10-19 DIAGNOSIS — K2970 Gastritis, unspecified, without bleeding: Secondary | ICD-10-CM | POA: Insufficient documentation

## 2012-10-19 DIAGNOSIS — F1123 Opioid dependence with withdrawal: Secondary | ICD-10-CM

## 2012-10-19 DIAGNOSIS — R109 Unspecified abdominal pain: Secondary | ICD-10-CM | POA: Insufficient documentation

## 2012-10-19 DIAGNOSIS — K219 Gastro-esophageal reflux disease without esophagitis: Secondary | ICD-10-CM | POA: Insufficient documentation

## 2012-10-19 DIAGNOSIS — G8929 Other chronic pain: Secondary | ICD-10-CM | POA: Insufficient documentation

## 2012-10-19 DIAGNOSIS — Z8619 Personal history of other infectious and parasitic diseases: Secondary | ICD-10-CM | POA: Insufficient documentation

## 2012-10-19 DIAGNOSIS — M549 Dorsalgia, unspecified: Secondary | ICD-10-CM | POA: Insufficient documentation

## 2012-10-19 LAB — COMPREHENSIVE METABOLIC PANEL
ALT: 26 U/L (ref 0–35)
Albumin: 3.5 g/dL (ref 3.5–5.2)
Alkaline Phosphatase: 108 U/L (ref 39–117)
Calcium: 9.7 mg/dL (ref 8.4–10.5)
GFR calc Af Amer: >90 mL/min (ref >90)
Glucose, Bld: 113 mg/dL — ABNORMAL HIGH (ref 70–99)
Potassium: 3.6 mEq/L (ref 3.5–5.1)
Sodium: 137 mEq/L (ref 135–145)
Total Protein: 7.6 g/dL (ref 6.0–8.3)

## 2012-10-19 LAB — CBC WITH DIFFERENTIAL/PLATELET
Basophils Relative: 0 % (ref 0–1)
Eosinophils Absolute: 0 10*3/uL (ref 0.0–0.7)
Eosinophils Relative: 0 % (ref 0–5)
Lymphs Abs: 0.6 10*3/uL — ABNORMAL LOW (ref 0.7–4.0)
MCH: 31.9 pg (ref 26.0–34.0)
MCHC: 35.1 g/dL (ref 30.0–36.0)
MCV: 91 fL (ref 78.0–100.0)
Neutrophils Relative %: 87 % — ABNORMAL HIGH (ref 43–77)
Platelets: 192 10*3/uL (ref 150–400)
RDW: 13.1 % (ref 11.5–15.5)

## 2012-10-19 MED ORDER — DIPHENHYDRAMINE HCL 50 MG/ML IJ SOLN
50.0000 mg | Freq: Once | INTRAMUSCULAR | Status: AC
  Administered 2012-10-19: 50 mg via INTRAVENOUS
  Filled 2012-10-19: qty 1

## 2012-10-19 MED ORDER — ONDANSETRON HCL 4 MG/2ML IJ SOLN
4.0000 mg | Freq: Once | INTRAMUSCULAR | Status: AC
  Administered 2012-10-19: 4 mg via INTRAVENOUS
  Filled 2012-10-19: qty 2

## 2012-10-19 MED ORDER — CLONIDINE HCL 0.1 MG PO TABS
0.1000 mg | ORAL_TABLET | Freq: Once | ORAL | Status: AC
  Administered 2012-10-19: 0.1 mg via ORAL
  Filled 2012-10-19: qty 1

## 2012-10-19 MED ORDER — PROMETHAZINE HCL 25 MG PO TABS: 25.0000 mg | ORAL_TABLET | Freq: Three times a day (TID) | ORAL | Status: DC | PRN

## 2012-10-19 MED ORDER — LORAZEPAM 2 MG/ML IJ SOLN
1.0000 mg | Freq: Once | INTRAMUSCULAR | Status: AC
  Administered 2012-10-19: 1 mg via INTRAVENOUS
  Filled 2012-10-19: qty 1

## 2012-10-19 MED ORDER — SODIUM CHLORIDE 0.9 % IV SOLN: 1000.0000 mL | INTRAVENOUS | Status: DC

## 2012-10-19 MED ORDER — SODIUM CHLORIDE 0.9 % IV SOLN: 1000.0000 mL | Freq: Once | INTRAVENOUS | Status: DC

## 2012-10-19 MED ORDER — PROMETHAZINE HCL 25 MG RE SUPP: 25.0000 mg | Freq: Four times a day (QID) | RECTAL | Status: DC | PRN

## 2012-10-19 MED ORDER — KETOROLAC TROMETHAMINE 30 MG/ML IJ SOLN
30.0000 mg | Freq: Once | INTRAMUSCULAR | Status: AC
  Administered 2012-10-19: 30 mg via INTRAVENOUS
  Filled 2012-10-19: qty 1

## 2012-10-19 MED ORDER — SODIUM CHLORIDE 0.9 % IV SOLN
1000.0000 mL | Freq: Once | INTRAVENOUS | Status: AC
  Administered 2012-10-19: 1000 mL via INTRAVENOUS

## 2012-10-19 MED ORDER — METOCLOPRAMIDE HCL 5 MG/ML IJ SOLN
10.0000 mg | Freq: Once | INTRAMUSCULAR | Status: AC
  Administered 2012-10-19: 10 mg via INTRAVENOUS
  Filled 2012-10-19: qty 2

## 2012-10-19 NOTE — ED Notes (Signed)
V/d/abd pain and back pain that started yesterday.

## 2012-10-19 NOTE — ED Notes (Addendum)
Pt c/o mid-abdominal pain along with NVD. Pt states symptoms begin shortly after she eats and after she vomits pain goes away. Pt denies blood in vomit or diarrhea. Pt states 2 family members had similar symptoms within the past week.

## 2012-10-19 NOTE — ED Provider Notes (Signed)
History   This chart was scribed for [name], MD by [name], ED Scribe. The patient was seen in room APA09/APA09. Patient's care was started at 1135.   CSN: [id number]  Arrival date & time 10/19/12  1044   First MD Initiated Contact with Patient 10/19/12 1135      Chief Complaint  Patient presents with  . Emesis   [name] is a 26 y.o. female who presents to the Emergency Department complaining of numerous episodes of emesis with an onset of yesterday. She reports x20 episodes each of vomiting and diarrhea. She reports associated dizziness, generalized weakness and subjective fever. Temperature here in ED is 98.1 She states she has lower abdominal pain that radiates around to her lower back which is her usual chronic LBP. She denies any blood in the vomit or diarrhea. She denies any dysuria or vaginal discharge. She does describe frequency. She states she takes Loratab for her bulging disc in which she ran out yesterday morning prior to onset of symptoms. States her son had similar symptoms.   Patient is a 26 y.o. female presenting with vomiting. The history is provided by the patient. No language interpreter was used.  Emesis  This is a new problem. The current episode started yesterday. The problem occurs more than 10 times per day. The problem has been gradually worsening. The emesis has an appearance of stomach contents. Associated symptoms include abdominal pain, diarrhea and a fever.   PCP: Dr. Renard Matter  Past Medical History  Diagnosis Date  . Herpes   . GERD (gastroesophageal reflux disease)   . Gastritis   . Disk prolapse   . Bulging disc     Past Surgical History  Procedure Date  . Wisdom tooth extraction   . Esophagogastroduodenoscopy 01/19/2012    ULCERS,MULTIPLE IN THE ANTRUM/HIATAL HERNIA/  . Bulging disc in back     Family History  Problem Relation Age of Onset  . Diabetes Mother   . Diabetes Other   . Hypertension Other     History   Substance Use Topics  . Smoking status: Former Smoker -- 3 years    Types: Cigarettes    Quit date: 07/28/2011  . Smokeless tobacco: Never Used  . Alcohol Use: No  Works at [employer].  OB History    Grav Para Term Preterm Abortions TAB SAB Ect Mult Living   1 1 1       1       Review of Systems  Constitutional: Positive for fever.  Gastrointestinal: Positive for nausea, vomiting, abdominal pain and diarrhea.  Musculoskeletal: Positive for back pain.  Neurological: Positive for dizziness and weakness.  All other systems reviewed and are negative.    Allergies  Review of patient's allergies indicates no known allergies.  Home Medications   Current Outpatient Rx  Name  Route  Sig  Dispense  Refill  . CITALOPRAM HYDROBROMIDE 40 MG PO TABS   Oral   Take 40 mg by mouth every morning.         Marland Kitchen CLONAZEPAM 0.5 MG PO TBDP   Oral   Take 0.5 mg by mouth 3 (three) times daily.          . IBUPROFEN 800 MG PO TABS      Take one every 8 hours for pain   15 tablet   0   . IBUPROFEN 800 MG PO TABS   Oral   Take 1 tablet (800 mg total) by  mouth every 8 (eight) hours as needed for pain.   21 tablet   0   . NORGESTIMATE-ETH ESTRADIOL 0.25-35 MG-MCG PO TABS   Oral   Take 1 tablet by mouth daily.         Marland Kitchen OMEPRAZOLE 20 MG PO CPDR   Oral   Take 20 mg by mouth 2 (two) times daily. 1 po 30 minutes before breakfast AND SUPPER         . TRAZODONE HCL 100 MG PO TABS   Oral   Take 100 mg by mouth at bedtime.         Marland Kitchen VALACYCLOVIR HCL 1 G PO TABS   Oral   Take 1,000 mg by mouth at bedtime.            BP 133/79  Pulse 106  Temp 98.1 F (36.7 C) (Oral)  Resp 20  Ht 5\' 6"  (1.676 m)  Wt 318 lb (144.244 kg)  BMI 51.33 kg/m2  SpO2 98%  LMP 09/22/2012  Vital signs normal tachycardia   Physical Exam  Nursing note and vitals reviewed. Constitutional: She is oriented to person, place, and time. She appears well-developed and well-nourished. No distress.        Morbidly obese.   HENT:  Head: Normocephalic and atraumatic.  Right Ear: External ear normal.  Left Ear: External ear normal.  Nose: Nose normal.  Mouth/Throat: Oropharynx is clear and moist. No oropharyngeal exudate.  Eyes: Conjunctivae normal and EOM are normal. Pupils are equal, round, and reactive to light.  Neck: Normal range of motion. Neck supple. No tracheal deviation present.  Cardiovascular: Normal rate, regular rhythm and normal heart sounds.   No murmur heard. Pulmonary/Chest: Effort normal and breath sounds normal. No respiratory distress. She has no wheezes. She has no rales.  Abdominal: Soft. Bowel sounds are normal. She exhibits no distension. There is tenderness. There is no rebound and no guarding.       Suprapubic abdominal tenderness over bladder.   Musculoskeletal: Normal range of motion. She exhibits tenderness. She exhibits no edema.       Lower sacral tenderness consistent with chronic back pain.  Neurological: She is alert and oriented to person, place, and time.  Skin: Skin is warm and dry.       Face is flushed, color is good.   Psychiatric: Her behavior is normal.       anxious    ED Course  Procedures (including critical care time)   Medications  0.9 %  sodium chloride infusion (0 mL Intravenous Stopped 10/19/12 1435)    Followed by  0.9 %  sodium chloride infusion (not administered)    Followed by  0.9 %  sodium chloride infusion (not administered)  ibuprofen (ADVIL,MOTRIN) 800 MG tablet (not administered)  promethazine (PHENERGAN) 25 MG suppository (not administered)  promethazine (PHENERGAN) 25 MG tablet (not administered)  metoCLOPramide (REGLAN) injection 10 mg (10 mg Intravenous Given 10/19/12 1249)  diphenhydrAMINE (BENADRYL) injection 50 mg (50 mg Intravenous Given 10/19/12 1250)  ketorolac (TORADOL) 30 MG/ML injection 30 mg (30 mg Intravenous Given 10/19/12 1331)  cloNIDine (CATAPRES) tablet 0.1 mg (0.1 mg Oral Given 10/19/12 1404)   LORazepam (ATIVAN) injection 1 mg (1 mg Intravenous Given 10/19/12 1404)  ondansetron (ZOFRAN) injection 4 mg (4 mg Intravenous Given 10/19/12 1429)     DIAGNOSTIC STUDIES: Oxygen Saturation is 98% on room air, normal by my interpretation.    COORDINATION OF CARE:  12:10-Discussed planned course of treatment with the patient  including IV fluids, Reglan, Benadryl, blood work and UA, who is agreeable at this time.   Nurse reports only one episode of vomiting small amount while in the ED, no diarrhea.  I have discussed with patient that she is having withdrawal symptoms and I have given her fluids and treatment for withdrawal. She will need to see Dr Renard Matter to get back on her chronic pain medications.   Pt has had 9 ED visits in past 6 months for chest pain, back pain, abdominal pain, knee pain  Review of [place] controlled substance site shows she has gotten 26 prescriptions for narcotics in the past 6 months, mainly from her PCP Dr. Renard Matter in her neurologist Dr. Gerilyn Pilgrim, and  also from multiple ED providers. She gets #40 hydrocodone 7.5/325 about every 2 weeks from Dr Renard Matter   Results for orders placed during the hospital encounter of 10/19/12  CBC WITH DIFFERENTIAL      Component Value Range   WBC 7.0  4.0 - 10.5 K/uL   RBC 4.89  3.87 - 5.11 MIL/uL   Hemoglobin 15.6 (*) 12.0 - 15.0 g/dL   HCT 04.5  40.9 - 81.1 %   MCV 91.0  78.0 - 100.0 fL   MCH 31.9  26.0 - 34.0 pg   MCHC 35.1  30.0 - 36.0 g/dL   RDW 91.4  78.2 - 95.6 %   Platelets 192  150 - 400 K/uL   Neutrophils Relative 87 (*) 43 - 77 %   Neutro Abs 6.1  1.7 - 7.7 K/uL   Lymphocytes Relative 8 (*) 12 - 46 %   Lymphs Abs 0.6 (*) 0.7 - 4.0 K/uL   Monocytes Relative 5  3 - 12 %   Monocytes Absolute 0.3  0.1 - 1.0 K/uL   Eosinophils Relative 0  0 - 5 %   Eosinophils Absolute 0.0  0.0 - 0.7 K/uL   Basophils Relative 0  0 - 1 %   Basophils Absolute 0.0  0.0 - 0.1 K/uL  COMPREHENSIVE METABOLIC PANEL      Component  Value Range   Sodium 137  135 - 145 mEq/L   Potassium 3.6  3.5 - 5.1 mEq/L   Chloride 102  96 - 112 mEq/L   CO2 25  19 - 32 mEq/L   Glucose, Bld 113 (*) 70 - 99 mg/dL   BUN 12  6 - 23 mg/dL   Creatinine, Ser 2.13  0.50 - 1.10 mg/dL   Calcium 9.7  8.4 - 08.6 mg/dL   Total Protein 7.6  6.0 - 8.3 g/dL   Albumin 3.5  3.5 - 5.2 g/dL   AST 46 (*) 0 - 37 U/L   ALT 26  0 - 35 U/L   Alkaline Phosphatase 108  39 - 117 U/L   Total Bilirubin 0.4  0.3 - 1.2 mg/dL   GFR calc non Af Amer >90  >90 mL/min   GFR calc Af Amer >90  >90 mL/min   Laboratory interpretation all normal except mildly concentrated hemoglobin consistent with dehydration   05/12/2012 Clinical Data: Right L5 radiculopathy. Lumbar disc displacement  without myelopathy.  LUMBAR MYELOGRAM  IMPRESSION:  1. Technically successful lumbar puncture for lumbar myelogram at  L3-L4.  2. Lumbosacral transitional anatomy. Numbering used on prior MRI  03/31/2012 preserved.  3. Mild L4-L5 disc bulge in the neutral and extension positions.  CT LUMBAR MYELOGRAM  Technique: Contiguous axial images were obtained through the lumbar  spine without infusion. Coronal, sagittal,  and disc space  reconstructions were obtained of the axial image sets.  Findings: Lumbosacral transitional anatomy is present. As noted  above, the numbering convention used on the prior exam is  preserved. The sacral lies to the right L5 transverse processes  fused to the sacrum. There is a mildly degenerated pseudoarthrosis  involving the left sacral lies L5 transverse process. Lumbar  spinal alignment is anatomic. T12 vertebra demonstrates a normally  formed rib. Spinal cord terminates posterior to T12-L1.  Paraspinal soft tissues are within normal limits. Bilateral SI  joint degenerative disease is present with vacuum joint and  subchondral sclerosis bilaterally.  T11-T12: Negative.  T12-L1: Negative.  L1-L2: Negative.  L2-L3: Negative.  L3-L4: Negative.   L4-L5: Disc degeneration is present. Mild facet degeneration.  Shallow broad-based posterior disc bulge. Mild loss of disc  height. Central canal appears adequately patent. Bulging disc  just contacts the descending L5 nerve root sleeves bilaterally,  without effacement of contrast or neural compression. The foramina  are adequately patent.  L5-S1: Rudimentary disc. No stenosis. There is a there is a well  formed solid bony L5 right neural foramen encasing the nerve. No  neural compression.  IMPRESSION:  1. Lumbosacral transitional anatomy. Numbering convention used on  prior exam preserved. 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  2. Mild L4-L5 degenerative disc disease. Foramina and central  canal patent. Lateral recesses appear adequately patent with disc  bulge just contacting the descending L5 nerve root sleeves, without  effacement contrast or neural compression.  Original Report Authenticated By: Andreas Newport, M.D.         1. Narcotic withdrawal   2. Vomiting and diarrhea   3. Chronic back pain    New Prescriptions   PROMETHAZINE (PHENERGAN) 25 MG SUPPOSITORY    Place 1 suppository (25 mg total) rectally every 6 (six) hours as needed for nausea.   PROMETHAZINE (PHENERGAN) 25 MG TABLET    Take 1 tablet (25 mg total) by mouth every 8 (eight) hours as needed for nausea.    Plan discharge  Devoria Albe, MD, FACEP    MDM    I personally performed the services described in this documentation, which was scribed in my presence. The recorded information has been reviewed and considered.  Devoria Albe, MD, Armando Gang      [name], MD 10/19/12 [phone]

## 2012-10-19 NOTE — ED Notes (Signed)
Dr.knapp to see pt, discussed plan of care and that she had multiple prescriptions in the last  Few months for narcotics. Pt encouraged to follow up with her pmd for medication refill.  Pt nodded in understanding. md advised pt that she was going to treat her withdrawal symptoms and discharge pt. To home.

## 2012-11-30 ENCOUNTER — Emergency Department (HOSPITAL_COMMUNITY)
Admission: EM | Admit: 2012-11-30 | Discharge: 2012-11-30 | Disposition: A | Discharge status: Home / Self Care | Payer: Medicaid Other | Attending: Emergency Medicine | Admitting: Emergency Medicine

## 2012-11-30 ENCOUNTER — Encounter (HOSPITAL_COMMUNITY): Payer: Self-pay | Admitting: *Deleted

## 2012-11-30 ENCOUNTER — Emergency Department (HOSPITAL_COMMUNITY): Payer: Medicaid Other

## 2012-11-30 DIAGNOSIS — Z79899 Other long term (current) drug therapy: Secondary | ICD-10-CM | POA: Insufficient documentation

## 2012-11-30 DIAGNOSIS — M549 Dorsalgia, unspecified: Secondary | ICD-10-CM | POA: Insufficient documentation

## 2012-11-30 DIAGNOSIS — Z8619 Personal history of other infectious and parasitic diseases: Secondary | ICD-10-CM | POA: Insufficient documentation

## 2012-11-30 DIAGNOSIS — Z8719 Personal history of other diseases of the digestive system: Secondary | ICD-10-CM | POA: Insufficient documentation

## 2012-11-30 DIAGNOSIS — M79672 Pain in left foot: Secondary | ICD-10-CM

## 2012-11-30 DIAGNOSIS — M25579 Pain in unspecified ankle and joints of unspecified foot: Secondary | ICD-10-CM | POA: Insufficient documentation

## 2012-11-30 DIAGNOSIS — Z87891 Personal history of nicotine dependence: Secondary | ICD-10-CM | POA: Insufficient documentation

## 2012-11-30 DIAGNOSIS — K219 Gastro-esophageal reflux disease without esophagitis: Secondary | ICD-10-CM | POA: Insufficient documentation

## 2012-11-30 NOTE — ED Notes (Signed)
Lt foot pain, onset yesterday,no known injury.

## 2012-11-30 NOTE — ED Provider Notes (Signed)
History     CSN: [id number]  Arrival date & time 11/30/12  2031   First MD Initiated Contact with Patient 11/30/12 2133      Chief Complaint  Patient presents with  . Foot Pain    (Consider location/radiation/quality/duration/timing/severity/associated sxs/prior treatment) Patient is a 27 y.o. female presenting with lower extremity pain. The history is provided by the patient.  Foot Pain This is a new problem. The current episode started yesterday. The problem occurs constantly. The problem has been gradually worsening. Associated symptoms include arthralgias. Pertinent negatives include no abdominal pain, chest pain, coughing or neck pain. The symptoms are aggravated by standing and walking. She has tried nothing for the symptoms. The treatment provided no relief.    Past Medical History  Diagnosis Date  . Herpes   . GERD (gastroesophageal reflux disease)   . Gastritis   . Disk prolapse   . Bulging disc     Past Surgical History  Procedure Date  . Wisdom tooth extraction   . Esophagogastroduodenoscopy 01/19/2012    ULCERS,MULTIPLE IN THE ANTRUM/HIATAL HERNIA/  . Bulging disc in back     Family History  Problem Relation Age of Onset  . Diabetes Mother   . Diabetes Other   . Hypertension Other     History  Substance Use Topics  . Smoking status: Former Smoker -- 3 years    Types: Cigarettes    Quit date: 07/28/2011  . Smokeless tobacco: Never Used  . Alcohol Use: No    OB History    Grav Para Term Preterm Abortions TAB SAB Ect Mult Living   1 1 1       1       Review of Systems  Constitutional: Negative for activity change.       All ROS Neg except as noted in HPI  HENT: Negative for nosebleeds and neck pain.   Eyes: Negative for photophobia and discharge.  Respiratory: Negative for cough, shortness of breath and wheezing.   Cardiovascular: Negative for chest pain and palpitations.  Gastrointestinal: Negative for abdominal pain and blood in stool.    Genitourinary: Negative for dysuria, frequency and hematuria.  Musculoskeletal: Positive for back pain and arthralgias.  Skin: Negative.   Neurological: Negative for dizziness, seizures and speech difficulty.  Psychiatric/Behavioral: Negative for hallucinations and confusion.    Allergies  Review of patient's allergies indicates no known allergies.  Home Medications   Current Outpatient Rx  Name  Route  Sig  Dispense  Refill  . CITALOPRAM HYDROBROMIDE 40 MG PO TABS   Oral   Take 40 mg by mouth every morning.         Marland Kitchen CLONAZEPAM 0.5 MG PO TBDP   Oral   Take 0.5 mg by mouth 3 (three) times daily.          Marland Kitchen NORGESTIMATE-ETH ESTRADIOL 0.25-35 MG-MCG PO TABS   Oral   Take 1 tablet by mouth daily.         Marland Kitchen OMEPRAZOLE 20 MG PO CPDR   Oral   Take 20 mg by mouth 2 (two) times daily. 1 po 30 minutes before breakfast AND SUPPER         . TRAZODONE HCL 100 MG PO TABS   Oral   Take 100 mg by mouth at bedtime.         Marland Kitchen VALACYCLOVIR HCL 1 G PO TABS   Oral   Take 1,000 mg by mouth at bedtime.  BP 112/48  Pulse 91  Temp 97.6 F (36.4 C) (Oral)  Resp 18  Ht 5\' 6"  (1.676 m)  Wt 325 lb (147.419 kg)  BMI 52.46 kg/m2  SpO2 100%  LMP 11/13/2012  Physical Exam  Nursing note and vitals reviewed. Constitutional: She is oriented to person, place, and time. She appears well-developed and well-nourished.  Non-toxic appearance.  HENT:  Head: Normocephalic.  Right Ear: Tympanic membrane and external ear normal.  Left Ear: Tympanic membrane and external ear normal.  Eyes: EOM and lids are normal. Pupils are equal, round, and reactive to light.  Neck: Normal range of motion. Neck supple. Carotid bruit is not present.  Cardiovascular: Normal rate, regular rhythm, normal heart sounds, intact distal pulses and normal pulses.   Pulmonary/Chest: Breath sounds normal. No respiratory distress.  Abdominal: Soft. Bowel sounds are normal. There is no tenderness. There  is no guarding.  Musculoskeletal: Normal range of motion.       There is full range of motion of the left knee. There is full range of motion of the left ankle. There is pain to palpation of the dorsum of the left foot. There is  no problem with flexing of the toes. Capillary refill is less than 3 seconds. Sensory is intact.  Lymphadenopathy:       Head (right side): No submandibular adenopathy present.       Head (left side): No submandibular adenopathy present.    She has no cervical adenopathy.  Neurological: She is alert and oriented to person, place, and time. She has normal strength. No cranial nerve deficit or sensory deficit.  Skin: Skin is warm and dry.  Psychiatric: She has a normal mood and affect. Her speech is normal.    ED Course  Procedures (including critical care time)  Labs Reviewed - No data to display No results found.   No diagnosis found.    MDM  I have reviewed nursing notes, vital signs, and all appropriate lab and imaging results for this patient. Xray of the left foot is negative for acute changes. Plan for pt to see the podiatrist for additional evaluation. Pt re-assured of exam findings and xray findings. She will follow up at her PCP for additional evaluation.       Kathie Dike, [place] 12/03/12 [phone]

## 2012-12-06 NOTE — ED Provider Notes (Addendum)
Medical screening examination/treatment/procedure(s) were performed by non-physician practitioner and as supervising physician I was immediately available for consultation/collaboration. Devoria Albe, MD, FACEP   [name], MD 12/06/12 Nicholos Johns  [name], MD 12/06/12 Windell Moment

## 2012-12-08 ENCOUNTER — Encounter (HOSPITAL_COMMUNITY): Payer: Self-pay | Admitting: Emergency Medicine

## 2012-12-08 ENCOUNTER — Emergency Department (HOSPITAL_COMMUNITY)
Admission: EM | Admit: 2012-12-08 | Discharge: 2012-12-08 | Disposition: A | Discharge status: Home / Self Care | Payer: Medicaid Other | Attending: Emergency Medicine | Admitting: Emergency Medicine

## 2012-12-08 ENCOUNTER — Emergency Department (HOSPITAL_COMMUNITY): Payer: Medicaid Other

## 2012-12-08 DIAGNOSIS — Y929 Unspecified place or not applicable: Secondary | ICD-10-CM | POA: Insufficient documentation

## 2012-12-08 DIAGNOSIS — Z8739 Personal history of other diseases of the musculoskeletal system and connective tissue: Secondary | ICD-10-CM | POA: Insufficient documentation

## 2012-12-08 DIAGNOSIS — K219 Gastro-esophageal reflux disease without esophagitis: Secondary | ICD-10-CM | POA: Insufficient documentation

## 2012-12-08 DIAGNOSIS — Z8619 Personal history of other infectious and parasitic diseases: Secondary | ICD-10-CM | POA: Insufficient documentation

## 2012-12-08 DIAGNOSIS — Y939 Activity, unspecified: Secondary | ICD-10-CM | POA: Insufficient documentation

## 2012-12-08 DIAGNOSIS — S60229A Contusion of unspecified hand, initial encounter: Secondary | ICD-10-CM | POA: Insufficient documentation

## 2012-12-08 DIAGNOSIS — Z8719 Personal history of other diseases of the digestive system: Secondary | ICD-10-CM | POA: Insufficient documentation

## 2012-12-08 DIAGNOSIS — Z87891 Personal history of nicotine dependence: Secondary | ICD-10-CM | POA: Insufficient documentation

## 2012-12-08 DIAGNOSIS — X58XXXA Exposure to other specified factors, initial encounter: Secondary | ICD-10-CM | POA: Insufficient documentation

## 2012-12-08 DIAGNOSIS — T148XXA Other injury of unspecified body region, initial encounter: Secondary | ICD-10-CM

## 2012-12-08 DIAGNOSIS — Z79899 Other long term (current) drug therapy: Secondary | ICD-10-CM | POA: Insufficient documentation

## 2012-12-08 MED ORDER — OXYCODONE-ACETAMINOPHEN 5-325 MG PO TABS
1.0000 | ORAL_TABLET | Freq: Once | ORAL | Status: AC
  Administered 2012-12-08: 1 via ORAL
  Filled 2012-12-08: qty 1

## 2012-12-08 MED ORDER — HYDROCODONE-ACETAMINOPHEN 5-325 MG PO TABS: 2.0000 | ORAL_TABLET | ORAL | Status: DC | PRN

## 2012-12-08 NOTE — ED Notes (Signed)
Pt c/o hand pain and bruising. States she woke up that way.

## 2012-12-08 NOTE — ED Provider Notes (Signed)
History     CSN: [id number]  Arrival date & time 12/08/12  1508   None     Chief Complaint  Patient presents with  . Hand Pain    (Consider location/radiation/quality/duration/timing/severity/associated sxs/prior treatment) HPI [name] is a 27 y.o. female who presents to the ED with hand pain the pain started today. She woke with the pain and noted bruising to the back of the hand. Complains of severe pain. She works at the [employer] and uses her hands all day. She does not remember any injury. She denies any other problems. The history was provided by the patient.    Past Medical History  Diagnosis Date  . Herpes   . GERD (gastroesophageal reflux disease)   . Gastritis   . Disk prolapse   . Bulging disc     Past Surgical History  Procedure Date  . Wisdom tooth extraction   . Esophagogastroduodenoscopy 01/19/2012    ULCERS,MULTIPLE IN THE ANTRUM/HIATAL HERNIA/  . Bulging disc in back     Family History  Problem Relation Age of Onset  . Diabetes Mother   . Diabetes Other   . Hypertension Other     History  Substance Use Topics  . Smoking status: Former Smoker -- 3 years    Types: Cigarettes    Quit date: 07/28/2011  . Smokeless tobacco: Never Used  . Alcohol Use: No    OB History    Grav Para Term Preterm Abortions TAB SAB Ect Mult Living   1 1 1       1       Review of Systems  Constitutional: Negative for fever, chills and activity change.  HENT: Negative.   Eyes: Negative.   Cardiovascular: Negative.   Gastrointestinal: Negative for nausea and vomiting.  Musculoskeletal:       Pain right hand  Skin:       bruising   Psychiatric/Behavioral: Negative for confusion. The patient is not nervous/anxious.     Allergies  Review of patient's allergies indicates no known allergies.  Home Medications   Current Outpatient Rx  Name  Route  Sig  Dispense  Refill  . CITALOPRAM HYDROBROMIDE 40 MG PO TABS   Oral   Take 40 mg by mouth every  morning.         Marland Kitchen CLONAZEPAM 0.5 MG PO TBDP   Oral   Take 0.5 mg by mouth 3 (three) times daily.          Marland Kitchen NORGESTIMATE-ETH ESTRADIOL 0.25-35 MG-MCG PO TABS   Oral   Take 1 tablet by mouth daily.         Marland Kitchen OMEPRAZOLE 20 MG PO CPDR   Oral   Take 20 mg by mouth 2 (two) times daily. 1 po 30 minutes before breakfast AND SUPPER         . TRAZODONE HCL 100 MG PO TABS   Oral   Take 100 mg by mouth at bedtime.         Marland Kitchen VALACYCLOVIR HCL 1 G PO TABS   Oral   Take 1,000 mg by mouth at bedtime.            BP 112/61  Pulse 102  Temp 98.1 F (36.7 C) (Oral)  SpO2 100%  LMP 11/13/2012  Physical Exam  Nursing note and vitals reviewed. Constitutional: She is oriented to person, place, and time. She appears well-developed and well-nourished. No distress.  HENT:  Head: Normocephalic and atraumatic.  Neck: Neck  supple.  Cardiovascular:       tachycarida  Pulmonary/Chest: Effort normal.  Musculoskeletal:       Right hand with ecchymosis dorsal aspect. Tender with palpation and range of motion. Radial pulse strong, adequate circulation. Good touch sensation.  Neurological: She is alert and oriented to person, place, and time. No cranial nerve deficit or sensory deficit.  Skin: Skin is warm and dry.          Ecchymosis right hand  Psychiatric: She has a normal mood and affect. Her behavior is normal. Judgment and thought content normal.   Procedures  Labs Reviewed - No data to display Dg Hand Complete Right  12/08/2012  *RADIOLOGY REPORT*  Clinical Data: Pain  RIGHT HAND - COMPLETE 3+ VIEW  Comparison: None.  Findings: Three views of the right hand submitted.  No acute fracture or subluxation.  No periosteal reaction or bony erosion.  IMPRESSION: No acute fracture or subluxation.   Original Report Authenticated By: Natasha Mead, M.D.    Assessment: 27 y.o. female with contusion to right hand  Plan:  Pain management   Compression dressing, ice, elevate   Follow up with  PCP or ortho, return here as needed        Janne Napoleon, NP 12/08/12 1655

## 2012-12-09 NOTE — ED Provider Notes (Signed)
Medical screening examination/treatment/procedure(s) were performed by non-physician practitioner and as supervising physician I was immediately available for consultation/collaboration.   Dione Booze, MD 12/09/12 [phone]

## 2012-12-12 ENCOUNTER — Telehealth: Payer: Self-pay | Admitting: Orthopedic Surgery

## 2012-12-12 NOTE — Telephone Encounter (Signed)
Patient called following Emergency Room visit at [facility], to request appointment for problem right hand contusion.  Patient confirmed insurance, [employer], which requires referral authorization from the primary care physician on her card (Dr. Renard Matter).  Understands and will contact their office first, and call back accordingly.

## 2012-12-13 ENCOUNTER — Ambulatory Visit (HOSPITAL_COMMUNITY)
Admission: RE | Admit: 2012-12-13 | Discharge: 2012-12-13 | Disposition: A | Discharge status: Home / Self Care | Payer: Medicaid Other | Source: Ambulatory Visit | Attending: Family Medicine | Admitting: Family Medicine

## 2012-12-13 ENCOUNTER — Other Ambulatory Visit (HOSPITAL_COMMUNITY): Payer: Self-pay | Admitting: Family Medicine

## 2012-12-13 DIAGNOSIS — R609 Edema, unspecified: Secondary | ICD-10-CM

## 2012-12-13 DIAGNOSIS — R52 Pain, unspecified: Secondary | ICD-10-CM

## 2012-12-13 DIAGNOSIS — M79609 Pain in unspecified limb: Secondary | ICD-10-CM | POA: Insufficient documentation

## 2012-12-13 DIAGNOSIS — M7989 Other specified soft tissue disorders: Secondary | ICD-10-CM | POA: Insufficient documentation

## 2012-12-31 ENCOUNTER — Encounter (HOSPITAL_COMMUNITY): Payer: Self-pay | Admitting: *Deleted

## 2012-12-31 ENCOUNTER — Emergency Department (HOSPITAL_COMMUNITY)
Admission: EM | Admit: 2012-12-31 | Discharge: 2012-12-31 | Disposition: A | Discharge status: Home / Self Care | Payer: Medicaid Other | Attending: Emergency Medicine | Admitting: Emergency Medicine

## 2012-12-31 ENCOUNTER — Other Ambulatory Visit: Payer: Self-pay

## 2012-12-31 ENCOUNTER — Emergency Department (HOSPITAL_COMMUNITY): Payer: Medicaid Other

## 2012-12-31 DIAGNOSIS — Z87891 Personal history of nicotine dependence: Secondary | ICD-10-CM | POA: Insufficient documentation

## 2012-12-31 DIAGNOSIS — K219 Gastro-esophageal reflux disease without esophagitis: Secondary | ICD-10-CM | POA: Insufficient documentation

## 2012-12-31 DIAGNOSIS — Z79899 Other long term (current) drug therapy: Secondary | ICD-10-CM | POA: Insufficient documentation

## 2012-12-31 DIAGNOSIS — S91309A Unspecified open wound, unspecified foot, initial encounter: Secondary | ICD-10-CM | POA: Insufficient documentation

## 2012-12-31 DIAGNOSIS — Y939 Activity, unspecified: Secondary | ICD-10-CM | POA: Insufficient documentation

## 2012-12-31 DIAGNOSIS — Y929 Unspecified place or not applicable: Secondary | ICD-10-CM | POA: Insufficient documentation

## 2012-12-31 DIAGNOSIS — W269XXA Contact with unspecified sharp object(s), initial encounter: Secondary | ICD-10-CM | POA: Insufficient documentation

## 2012-12-31 DIAGNOSIS — Z8719 Personal history of other diseases of the digestive system: Secondary | ICD-10-CM | POA: Insufficient documentation

## 2012-12-31 DIAGNOSIS — Z8739 Personal history of other diseases of the musculoskeletal system and connective tissue: Secondary | ICD-10-CM | POA: Insufficient documentation

## 2012-12-31 DIAGNOSIS — Z8619 Personal history of other infectious and parasitic diseases: Secondary | ICD-10-CM | POA: Insufficient documentation

## 2012-12-31 MED ORDER — NAPROXEN 500 MG PO TABS: 500.0000 mg | ORAL_TABLET | Freq: Two times a day (BID) | ORAL | Status: DC

## 2012-12-31 MED ORDER — NAPROXEN 250 MG PO TABS
500.0000 mg | ORAL_TABLET | Freq: Once | ORAL | Status: AC
  Administered 2012-12-31: 500 mg via ORAL
  Filled 2012-12-31: qty 2

## 2012-12-31 MED ORDER — CEPHALEXIN 500 MG PO CAPS: 500.0000 mg | ORAL_CAPSULE | Freq: Four times a day (QID) | ORAL | Status: DC

## 2012-12-31 NOTE — ED Notes (Signed)
Pt c/o a crack on the side of her right foot x 3 days.

## 2012-12-31 NOTE — ED Notes (Signed)
Pt seen and evaluated by EDP for initial assessment. 

## 2012-12-31 NOTE — ED Provider Notes (Signed)
History     CSN: [id number]  Arrival date & time 12/31/12  1956   First MD Initiated Contact with Patient 12/31/12 2057      Chief Complaint  Patient presents with  . Foot Pain    (Consider location/radiation/quality/duration/timing/severity/associated sxs/prior treatment) HPI Comments: Pt has had pain in the L foot X 3 days -started as a small cut on the lateral side of the foot just proximal to the toe.  This is persistent, nothing makes better or worse and not associated with f/cn/v.  She has assocaited foot pain but no swelling.    Patient is a 27 y.o. female presenting with lower extremity pain. The history is provided by the patient and the spouse.  Foot Pain    Past Medical History  Diagnosis Date  . Herpes   . GERD (gastroesophageal reflux disease)   . Gastritis   . Disk prolapse   . Bulging disc     Past Surgical History  Procedure Laterality Date  . Wisdom tooth extraction    . Esophagogastroduodenoscopy  01/19/2012    ULCERS,MULTIPLE IN THE ANTRUM/HIATAL HERNIA/  . Bulging disc in back      Family History  Problem Relation Age of Onset  . Diabetes Mother   . Diabetes Other   . Hypertension Other     History  Substance Use Topics  . Smoking status: Former Smoker -- 3 years    Types: Cigarettes    Quit date: 07/28/2011  . Smokeless tobacco: Never Used  . Alcohol Use: No    OB History   Grav Para Term Preterm Abortions TAB SAB Ect Mult Living   1 1 1       1       Review of Systems  HENT: Negative for neck pain.   Gastrointestinal: Negative for nausea and vomiting.  Musculoskeletal: Negative for back pain and joint swelling.  Skin: Positive for wound.  Neurological: Negative for weakness and numbness.    Allergies  Review of patient's allergies indicates no known allergies.  Home Medications   Current Outpatient Rx  Name  Route  Sig  Dispense  Refill  . citalopram (CELEXA) 40 MG tablet   Oral   Take 40 mg by mouth every morning.         . clonazePAM (KLONOPIN) 0.5 MG disintegrating tablet   Oral   Take 0.5 mg by mouth 3 (three) times daily.          . norgestimate-ethinyl estradiol (SPRINTEC 28) 0.25-35 MG-MCG tablet   Oral   Take 1 tablet by mouth daily.         Marland Kitchen omeprazole (PRILOSEC) 20 MG capsule   Oral   Take 20 mg by mouth 2 (two) times daily. 1 po 30 minutes before breakfast AND SUPPER         . traZODone (DESYREL) 100 MG tablet   Oral   Take 100 mg by mouth at bedtime.         . valACYclovir (VALTREX) 1000 MG tablet   Oral   Take 1,000 mg by mouth at bedtime.          . cephALEXin (KEFLEX) 500 MG capsule   Oral   Take 1 capsule (500 mg total) by mouth 4 (four) times daily.   20 capsule   0   . naproxen (NAPROSYN) 500 MG tablet   Oral   Take 1 tablet (500 mg total) by mouth 2 (two) times daily with a meal.  30 tablet   0     BP 112/68  Pulse 91  Temp(Src) 97.2 F (36.2 C) (Oral)  Resp 20  Ht 5\' 6"  (1.676 m)  Wt 328 lb (148.78 kg)  BMI 52.97 kg/m2  SpO2 99%  Physical Exam  Nursing note and vitals reviewed. Constitutional: She appears well-developed and well-nourished. No distress.  HENT:  Head: Normocephalic and atraumatic.  Eyes: Conjunctivae are normal. No scleral icterus.  Cardiovascular: Normal rate, regular rhythm and intact distal pulses.   Pulmonary/Chest: Effort normal and breath sounds normal.  Musculoskeletal: She exhibits tenderness ( ttp over teh wound, no swelling of the foot, mild ttp of the leg distal to knee, no swelling). She exhibits no edema.  Neurological: She is alert.  Skin: Skin is warm and dry. No rash noted. She is not diaphoretic.    ED Course  Procedures (including critical care time)  Labs Reviewed - No data to display Dg Foot Complete Right  12/31/2012  *RADIOLOGY REPORT*  Clinical Data: Right foot laceration 3 days ago, pain.  RIGHT FOOT COMPLETE - 3+ VIEW  Comparison: None.  Findings: Soft tissue swelling laterally.  No fracture or  foreign body.  No dislocation.  IMPRESSION: Soft tissue swelling laterally.  No osseous findings.   Original Report Authenticated By: Davonna Belling, M.D.      1. Open wound of foot       MDM  Open wound, small, non draining, healthy appearing, has surrounding ttp and xray shows soft tissue swelling, will treat with keflex, naprosyn, topical abx and f/u.  Doubt depe tissue infec, no FB seen.        Vida Roller, MD 12/31/12 2110

## 2013-01-03 ENCOUNTER — Emergency Department (HOSPITAL_COMMUNITY)
Admission: EM | Admit: 2013-01-03 | Discharge: 2013-01-03 | Disposition: A | Discharge status: Home / Self Care | Payer: Medicaid Other | Attending: Emergency Medicine | Admitting: Emergency Medicine

## 2013-01-03 ENCOUNTER — Encounter (HOSPITAL_COMMUNITY): Payer: Self-pay | Admitting: Emergency Medicine

## 2013-01-03 DIAGNOSIS — Z23 Encounter for immunization: Secondary | ICD-10-CM | POA: Insufficient documentation

## 2013-01-03 DIAGNOSIS — Z79899 Other long term (current) drug therapy: Secondary | ICD-10-CM | POA: Insufficient documentation

## 2013-01-03 DIAGNOSIS — B009 Herpesviral infection, unspecified: Secondary | ICD-10-CM | POA: Insufficient documentation

## 2013-01-03 DIAGNOSIS — M79671 Pain in right foot: Secondary | ICD-10-CM

## 2013-01-03 DIAGNOSIS — Z8719 Personal history of other diseases of the digestive system: Secondary | ICD-10-CM | POA: Insufficient documentation

## 2013-01-03 DIAGNOSIS — G8911 Acute pain due to trauma: Secondary | ICD-10-CM | POA: Insufficient documentation

## 2013-01-03 DIAGNOSIS — Z87891 Personal history of nicotine dependence: Secondary | ICD-10-CM | POA: Insufficient documentation

## 2013-01-03 DIAGNOSIS — M79609 Pain in unspecified limb: Secondary | ICD-10-CM | POA: Insufficient documentation

## 2013-01-03 DIAGNOSIS — Z8739 Personal history of other diseases of the musculoskeletal system and connective tissue: Secondary | ICD-10-CM | POA: Insufficient documentation

## 2013-01-03 DIAGNOSIS — K219 Gastro-esophageal reflux disease without esophagitis: Secondary | ICD-10-CM | POA: Insufficient documentation

## 2013-01-03 MED ORDER — ONDANSETRON HCL 4 MG PO TABS
4.0000 mg | ORAL_TABLET | Freq: Once | ORAL | Status: AC
  Administered 2013-01-03: 4 mg via ORAL
  Filled 2013-01-03: qty 1

## 2013-01-03 MED ORDER — BACITRACIN-NEOMYCIN-POLYMYXIN 400-5-5000 EX OINT
TOPICAL_OINTMENT | Freq: Once | CUTANEOUS | Status: AC
  Administered 2013-01-03: 1 via TOPICAL
  Filled 2013-01-03: qty 1

## 2013-01-03 MED ORDER — DICLOFENAC SODIUM 75 MG PO TBEC: 75.0000 mg | DELAYED_RELEASE_TABLET | Freq: Two times a day (BID) | ORAL | Status: DC

## 2013-01-03 MED ORDER — HYDROCODONE-ACETAMINOPHEN 5-325 MG PO TABS
ORAL_TABLET | ORAL | Status: AC
  Administered 2013-01-03: 1
  Filled 2013-01-03: qty 1

## 2013-01-03 MED ORDER — HYDROCODONE-ACETAMINOPHEN 5-325 MG PO TABS: 1.0000 | ORAL_TABLET | ORAL | Status: DC | PRN

## 2013-01-03 MED ORDER — CEFTRIAXONE SODIUM 1 G IJ SOLR
1.0000 g | Freq: Once | INTRAMUSCULAR | Status: AC
  Administered 2013-01-03: 1 g via INTRAMUSCULAR
  Filled 2013-01-03: qty 10

## 2013-01-03 MED ORDER — KETOROLAC TROMETHAMINE 10 MG PO TABS
10.0000 mg | ORAL_TABLET | Freq: Once | ORAL | Status: AC
  Administered 2013-01-03: 10 mg via ORAL
  Filled 2013-01-03: qty 1

## 2013-01-03 MED ORDER — HYDROCODONE-ACETAMINOPHEN 5-325 MG PO TABS
2.0000 | ORAL_TABLET | Freq: Once | ORAL | Status: AC
  Administered 2013-01-03: 1 via ORAL
  Filled 2013-01-03: qty 1

## 2013-01-03 MED ORDER — TETANUS-DIPHTH-ACELL PERTUSSIS 5-2.5-18.5 LF-MCG/0.5 IM SUSP
0.5000 mL | Freq: Once | INTRAMUSCULAR | Status: AC
  Administered 2013-01-03: 0.5 mL via INTRAMUSCULAR
  Filled 2013-01-03: qty 0.5

## 2013-01-03 MED ORDER — HYDROCODONE-ACETAMINOPHEN 10-325 MG PO TABS: 1.0000 | ORAL_TABLET | Freq: Once | ORAL | Status: DC

## 2013-01-03 NOTE — ED Notes (Signed)
Pt had lac to lat aspect of rt foot 1 week ago.  Pain No d/c or redness.

## 2013-01-03 NOTE — ED Notes (Signed)
Pt c/o rt foot pain where she has cut on the rt side of her foot.

## 2013-01-03 NOTE — ED Notes (Signed)
Pt was given 2 - vicodin tabs for pain

## 2013-01-03 NOTE — ED Provider Notes (Signed)
History     CSN: [id number]  Arrival date & time 01/03/13  2016   First MD Initiated Contact with Patient 01/03/13 2042      Chief Complaint  Patient presents with  . Foot Pain    (Consider location/radiation/quality/duration/timing/severity/associated sxs/prior treatment) HPI Comments: Patient sustained a laceration to the lateral surface of the right foot approximately one week ago. She was seen in the emergency department on February 15. The patient was treated with Naprosyn and Keflex. Patient states that she has increasing pain, and at times pain going up the anterior surface of the lower leg. There's been no red streaks noted. His been no high fever. There's been no falls.  Patient is a 27 y.o. female presenting with lower extremity pain. The history is provided by the patient.  Foot Pain This is a recurrent problem. The current episode started in the past 7 days. The problem occurs constantly. The problem has been gradually worsening. Associated symptoms include myalgias. Pertinent negatives include no abdominal pain, arthralgias, chest pain, chills, coughing, fever or neck pain. The symptoms are aggravated by standing and walking. She has tried NSAIDs (antibiotic.) for the symptoms. The treatment provided no relief.    Past Medical History  Diagnosis Date  . Herpes   . GERD (gastroesophageal reflux disease)   . Gastritis   . Disk prolapse   . Bulging disc     Past Surgical History  Procedure Laterality Date  . Wisdom tooth extraction    . Esophagogastroduodenoscopy  01/19/2012    ULCERS,MULTIPLE IN THE ANTRUM/HIATAL HERNIA/  . Bulging disc in back      Family History  Problem Relation Age of Onset  . Diabetes Mother   . Diabetes Other   . Hypertension Other     History  Substance Use Topics  . Smoking status: Former Smoker -- 3 years    Types: Cigarettes    Quit date: 07/28/2011  . Smokeless tobacco: Never Used  . Alcohol Use: No    OB History   Grav Para  Term Preterm Abortions TAB SAB Ect Mult Living   1 1 1       1       Review of Systems  Constitutional: Negative for fever, chills and activity change.       All ROS Neg except as noted in HPI  HENT: Negative for nosebleeds and neck pain.   Eyes: Negative for photophobia and discharge.  Respiratory: Negative for cough, shortness of breath and wheezing.   Cardiovascular: Negative for chest pain and palpitations.  Gastrointestinal: Negative for abdominal pain and blood in stool.  Genitourinary: Negative for dysuria, frequency and hematuria.  Musculoskeletal: Positive for myalgias. Negative for back pain and arthralgias.  Skin: Positive for wound.  Neurological: Negative for dizziness, seizures and speech difficulty.  Psychiatric/Behavioral: Negative for hallucinations and confusion.    Allergies  Review of patient's allergies indicates no known allergies.  Home Medications   Current Outpatient Rx  Name  Route  Sig  Dispense  Refill  . cephALEXin (KEFLEX) 500 MG capsule   Oral   Take 1 capsule (500 mg total) by mouth 4 (four) times daily.   20 capsule   0   . citalopram (CELEXA) 40 MG tablet   Oral   Take 40 mg by mouth every morning.         . clonazePAM (KLONOPIN) 0.5 MG disintegrating tablet   Oral   Take 0.5 mg by mouth 3 (three) times daily.          Marland Kitchen  naproxen (NAPROSYN) 500 MG tablet   Oral   Take 1 tablet (500 mg total) by mouth 2 (two) times daily with a meal.   30 tablet   0   . omeprazole (PRILOSEC) 20 MG capsule   Oral   Take 20 mg by mouth 2 (two) times daily. 1 po 30 minutes before breakfast AND SUPPER         . traZODone (DESYREL) 100 MG tablet   Oral   Take 100 mg by mouth at bedtime.         . valACYclovir (VALTREX) 1000 MG tablet   Oral   Take 1,000 mg by mouth at bedtime.          . diclofenac (VOLTAREN) 75 MG EC tablet   Oral   Take 1 tablet (75 mg total) by mouth 2 (two) times daily.   12 tablet   0   .  HYDROcodone-acetaminophen (NORCO/VICODIN) 5-325 MG per tablet   Oral   Take 1 tablet by mouth every 4 (four) hours as needed for pain.   15 tablet   0     BP 100/58  Pulse 99  Temp(Src) 98.1 F (36.7 C) (Oral)  Resp 18  Ht 5\' 6"  (1.676 m)  Wt 328 lb (148.78 kg)  BMI 52.97 kg/m2  SpO2 98%  LMP 12/31/2012  Physical Exam  Nursing note and vitals reviewed. Constitutional: She is oriented to person, place, and time. She appears well-developed and well-nourished.  Non-toxic appearance.  HENT:  Head: Normocephalic.  Right Ear: Tympanic membrane and external ear normal.  Left Ear: Tympanic membrane and external ear normal.  Eyes: EOM and lids are normal. Pupils are equal, round, and reactive to light.  Neck: Normal range of motion. Neck supple. Carotid bruit is not present.  Cardiovascular: Normal rate, regular rhythm, normal heart sounds, intact distal pulses and normal pulses.   Pulmonary/Chest: Breath sounds normal. No respiratory distress.  Abdominal: Soft. Bowel sounds are normal. There is no tenderness. There is no guarding.  Musculoskeletal: Normal range of motion.  There is a 1.1 cm laceration to the lateral portion of the right foot. There is granulation tissue in place. There is extreme tenderness to palpation around the cut area and extending along the fifth metatarsal. There is no swelling of the toes. There no lesions between the toes. There no puncture wounds to the plantar surface of the right foot. The foot, ankle, nor lower legs are hot to touch.  Lymphadenopathy:       Head (right side): No submandibular adenopathy present.       Head (left side): No submandibular adenopathy present.    She has no cervical adenopathy.  Neurological: She is alert and oriented to person, place, and time. She has normal strength. No cranial nerve deficit or sensory deficit.  Skin: Skin is warm and dry.  Psychiatric: She has a normal mood and affect. Her speech is normal.    ED Course   Procedures (including critical care time)  Labs Reviewed - No data to display No results found.   1. Foot pain, right       MDM  I have reviewed nursing notes, vital signs, and all appropriate lab and imaging results for this patient. Patient was seen and treated on February 15 for the cut and pain to the right foot. Patient states the pain is getting worse. Her pain does not allow her to stand and do her job. The patient presents now for additional evaluation  and management.  A bulky dressing was applied to the cut area and the patient was placed in a postoperative shoe. The patient was given an injection of Rocephin. The patient's pain management has been changed to diclofenac 2 times daily with food and Norco one every 4 hours as needed for pain #15. Patient is to see her primary physician for additional evaluation and management.       Kathie Dike, [place] 01/03/13 2146

## 2013-01-06 NOTE — ED Provider Notes (Signed)
History/physical exam/procedure(s) were performed by non-physician practitioner and as supervising physician I was immediately available for consultation/collaboration. I have reviewed all notes and am in agreement with care and plan.   Brooklynn Brandenburg S Helton Oleson, MD 01/06/13 1706 

## 2013-01-25 ENCOUNTER — Ambulatory Visit: Payer: Medicaid Other | Admitting: Urgent Care

## 2013-01-28 ENCOUNTER — Emergency Department (HOSPITAL_COMMUNITY)
Admission: EM | Admit: 2013-01-28 | Discharge: 2013-01-29 | Disposition: A | Discharge status: Home / Self Care | Payer: Medicaid Other | Attending: Emergency Medicine | Admitting: Emergency Medicine

## 2013-01-28 ENCOUNTER — Encounter (HOSPITAL_COMMUNITY): Payer: Self-pay | Admitting: *Deleted

## 2013-01-28 ENCOUNTER — Emergency Department (HOSPITAL_COMMUNITY): Payer: Medicaid Other

## 2013-01-28 DIAGNOSIS — Z8619 Personal history of other infectious and parasitic diseases: Secondary | ICD-10-CM | POA: Insufficient documentation

## 2013-01-28 DIAGNOSIS — R110 Nausea: Secondary | ICD-10-CM | POA: Insufficient documentation

## 2013-01-28 DIAGNOSIS — K219 Gastro-esophageal reflux disease without esophagitis: Secondary | ICD-10-CM | POA: Insufficient documentation

## 2013-01-28 DIAGNOSIS — M549 Dorsalgia, unspecified: Secondary | ICD-10-CM | POA: Insufficient documentation

## 2013-01-28 DIAGNOSIS — R1012 Left upper quadrant pain: Secondary | ICD-10-CM | POA: Insufficient documentation

## 2013-01-28 DIAGNOSIS — Z3202 Encounter for pregnancy test, result negative: Secondary | ICD-10-CM | POA: Insufficient documentation

## 2013-01-28 DIAGNOSIS — Z79899 Other long term (current) drug therapy: Secondary | ICD-10-CM | POA: Insufficient documentation

## 2013-01-28 DIAGNOSIS — R51 Headache: Secondary | ICD-10-CM | POA: Insufficient documentation

## 2013-01-28 DIAGNOSIS — Z8739 Personal history of other diseases of the musculoskeletal system and connective tissue: Secondary | ICD-10-CM | POA: Insufficient documentation

## 2013-01-28 DIAGNOSIS — Z87891 Personal history of nicotine dependence: Secondary | ICD-10-CM | POA: Insufficient documentation

## 2013-01-28 DIAGNOSIS — Z8719 Personal history of other diseases of the digestive system: Secondary | ICD-10-CM | POA: Insufficient documentation

## 2013-01-28 LAB — COMPREHENSIVE METABOLIC PANEL
ALT: 22 U/L (ref 0–35)
Albumin: 3.4 g/dL — ABNORMAL LOW (ref 3.5–5.2)
Alkaline Phosphatase: 110 U/L (ref 39–117)
Chloride: 106 mEq/L (ref 96–112)
Glucose, Bld: 72 mg/dL (ref 70–99)
Potassium: 3.8 mEq/L (ref 3.5–5.1)
Sodium: 140 mEq/L (ref 135–145)
Total Bilirubin: 0.2 mg/dL — ABNORMAL LOW (ref 0.3–1.2)
Total Protein: 7 g/dL (ref 6.0–8.3)

## 2013-01-28 LAB — URINALYSIS, ROUTINE W REFLEX MICROSCOPIC
Hgb urine dipstick: NEGATIVE
Nitrite: NEGATIVE
Protein, ur: NEGATIVE mg/dL
Specific Gravity, Urine: 1.025 (ref 1.005–1.030)
Urobilinogen, UA: 1 mg/dL (ref 0.0–1.0)

## 2013-01-28 LAB — CBC WITH DIFFERENTIAL/PLATELET
Eosinophils Absolute: 0.1 10*3/uL (ref 0.0–0.7)
Hemoglobin: 14.1 g/dL (ref 12.0–15.0)
Lymphs Abs: 2.6 10*3/uL (ref 0.7–4.0)
MCH: 32.6 pg (ref 26.0–34.0)
Monocytes Relative: 9 % (ref 3–12)
Neutro Abs: 7.7 10*3/uL (ref 1.7–7.7)
Neutrophils Relative %: 67 % (ref 43–77)
Platelets: 233 10*3/uL (ref 150–400)
RBC: 4.33 MIL/uL (ref 3.87–5.11)
WBC: 11.4 10*3/uL — ABNORMAL HIGH (ref 4.0–10.5)

## 2013-01-28 LAB — PREGNANCY, URINE: Preg Test, Ur: NEGATIVE

## 2013-01-28 LAB — URINE MICROSCOPIC-ADD ON

## 2013-01-28 MED ORDER — HYDROMORPHONE HCL PF 1 MG/ML IJ SOLN
1.0000 mg | Freq: Once | INTRAMUSCULAR | Status: AC
  Administered 2013-01-28: 1 mg via INTRAVENOUS
  Filled 2013-01-28: qty 1

## 2013-01-28 MED ORDER — IOHEXOL 300 MG/ML  SOLN
100.0000 mL | Freq: Once | INTRAMUSCULAR | Status: AC | PRN
  Administered 2013-01-28: 100 mL via INTRAVENOUS

## 2013-01-28 MED ORDER — SODIUM CHLORIDE 0.9 % IV SOLN
Freq: Once | INTRAVENOUS | Status: AC
  Administered 2013-01-28: 1000 mL via INTRAVENOUS

## 2013-01-28 MED ORDER — ONDANSETRON HCL 4 MG/2ML IJ SOLN
4.0000 mg | Freq: Once | INTRAMUSCULAR | Status: AC
  Administered 2013-01-28: 4 mg via INTRAVENOUS
  Filled 2013-01-28: qty 2

## 2013-01-28 MED ORDER — IOHEXOL 300 MG/ML  SOLN
50.0000 mL | Freq: Once | INTRAMUSCULAR | Status: AC | PRN
  Administered 2013-01-28: 50 mL via ORAL

## 2013-01-28 NOTE — ED Notes (Signed)
Pt with abd pain and headache with nausea, denies vomiting or diarrhea, pain started after eating dinner tonight

## 2013-01-28 NOTE — ED Provider Notes (Signed)
History  This chart was scribed for Isabel Human, MD by Bennett Scrape, ED Scribe. This patient was seen in room [facility] and the patient's care was started at 9:50 PM.  CSN: [id number]  Arrival date & time 01/28/13  1919   First MD Initiated Contact with Patient 01/28/13 2150      Chief Complaint  Patient presents with  . Abdominal Pain  . Headache     Patient is a 27 y.o. female presenting with abdominal pain. The history is provided by the patient. No language interpreter was used.  Abdominal Pain Pain location:  Epigastric Pain radiates to:  Back Onset quality:  Gradual Timing:  Constant Progression:  Worsening Chronicity:  New Context: eating   Associated symptoms: nausea   Associated symptoms: no chest pain, no cough, no diarrhea, no dysuria, no fever, no shortness of breath, no sore throat and no vomiting     MCKENA Harrington is a 27 y.o. female who presents to the Emergency Department complaining of 3 days of epigastric abdominal pain that radiates into her lower back with associated HA and nausea. She reports that her symptoms are mild currently and usually worse after eating. She denies having a h/o ulcers or gallbladder problems. She denies having prior episodes of similar symptoms. She denies fever, emesis and diarrhea as associated symptoms. She reports back pain from a bulging disc diagnosed one year ago and states that she gets injections every month by Dr. Gerilyn Pilgrim. She reports that she is currently on depression and anxiety medications, trazadone for sleeping and naprosyn and omeprazole for GERD. She denies having any major operations. She is a former smoker but denies alcohol use.  Past Medical History  Diagnosis Date  . Herpes   . GERD (gastroesophageal reflux disease)   . Gastritis   . Disk prolapse   . Bulging disc     Past Surgical History  Procedure Laterality Date  . Wisdom tooth extraction    . Esophagogastroduodenoscopy  01/19/2012     ULCERS,MULTIPLE IN THE ANTRUM/HIATAL HERNIA/  . Bulging disc in back      Family History  Problem Relation Age of Onset  . Diabetes Mother   . Diabetes Other   . Hypertension Other     History  Substance Use Topics  . Smoking status: Former Smoker -- 3 years    Quit date: 07/28/2011  . Smokeless tobacco: Never Used  . Alcohol Use: No    OB History   Grav Para Term Preterm Abortions TAB SAB Ect Mult Living   1 1 1       1       Review of Systems  Constitutional: Negative for fever.  HENT: Negative for ear pain and sore throat.   Respiratory: Negative for cough and shortness of breath.   Cardiovascular: Negative for chest pain.  Gastrointestinal: Positive for nausea and abdominal pain. Negative for vomiting and diarrhea.  Genitourinary: Negative for dysuria.  Musculoskeletal: Positive for back pain.  Skin: Negative for rash.  Neurological: Positive for headaches. Negative for seizures and syncope.  All other systems reviewed and are negative.    Allergies  Review of patient's allergies indicates no known allergies.-confirmed by pt at bedside  Home Medications   Current Outpatient Rx  Name  Route  Sig  Dispense  Refill  . citalopram (CELEXA) 40 MG tablet   Oral   Take 40 mg by mouth every morning.         . clonazePAM (KLONOPIN) 0.5  MG disintegrating tablet   Oral   Take 0.5 mg by mouth 3 (three) times daily.          . naproxen (NAPROSYN) 500 MG tablet   Oral   Take 1 tablet (500 mg total) by mouth 2 (two) times daily with a meal.   30 tablet   0   . omeprazole (PRILOSEC) 20 MG capsule   Oral   Take 20 mg by mouth 2 (two) times daily. 1 po 30 minutes before breakfast AND SUPPER         . traZODone (DESYREL) 100 MG tablet   Oral   Take 100 mg by mouth at bedtime.         . valACYclovir (VALTREX) 1000 MG tablet   Oral   Take 1,000 mg by mouth at bedtime.            Triage Vitals: BP 102/71  Pulse 103  Temp(Src) 97.2 F (36.2 C) (Oral)   Resp 16  Ht 5\' 6"  (1.676 m)  Wt 328 lb (148.78 kg)  BMI 52.97 kg/m2  SpO2 100%  LMP 12/31/2012  Physical Exam  Nursing note and vitals reviewed. Constitutional: She is oriented to person, place, and time. No distress.  Morbidly obese   HENT:  Head: Normocephalic and atraumatic.  Mouth/Throat: Oropharynx is clear and moist.  Eyes: Conjunctivae and EOM are normal. Pupils are equal, round, and reactive to light.  Neck: Normal range of motion. Neck supple. No tracheal deviation present.  Cardiovascular: Normal rate and regular rhythm.  Exam reveals no gallop and no friction rub.   No murmur heard. Pulmonary/Chest: Effort normal and breath sounds normal. No respiratory distress.  Abdominal: Soft. She exhibits no mass. There is tenderness (vague epigastric tenderness). There is no rebound and no guarding.  Massively obese abdomen  Musculoskeletal: Normal range of motion. She exhibits no edema.  No flank pain  Neurological: She is alert and oriented to person, place, and time.  Neurologically intact  Skin: Skin is warm and dry.  Psychiatric: She has a normal mood and affect. Her behavior is normal.    ED Course  Procedures (including critical care time)  DIAGNOSTIC STUDIES: Oxygen Saturation is 100% on room air, normal by my interpretation.    COORDINATION OF CARE: 10:06 PM-Discussed treatment plan which includes CT of abdomen, CBC panel and UA with pt at bedside and pt agreed to plan.   10:15 PM- Ordered 1 mg Dilaudid and 4 mg Zofran injection  12:09 AM Results for orders placed during the hospital encounter of 01/28/13  URINALYSIS, ROUTINE W REFLEX MICROSCOPIC      Result Value Range   Color, Urine YELLOW  YELLOW   APPearance CLOUDY (*) CLEAR   Specific Gravity, Urine 1.025  1.005 - 1.030   pH 6.5  5.0 - 8.0   Glucose, UA NEGATIVE  NEGATIVE mg/dL   Hgb urine dipstick NEGATIVE  NEGATIVE   Bilirubin Urine NEGATIVE  NEGATIVE   Ketones, ur NEGATIVE  NEGATIVE mg/dL    Protein, ur NEGATIVE  NEGATIVE mg/dL   Urobilinogen, UA 1.0  0.0 - 1.0 mg/dL   Nitrite NEGATIVE  NEGATIVE   Leukocytes, UA SMALL (*) NEGATIVE  PREGNANCY, URINE      Result Value Range   Preg Test, Ur NEGATIVE  NEGATIVE  URINE MICROSCOPIC-ADD ON      Result Value Range   Squamous Epithelial / LPF MANY (*) RARE   WBC, UA 7-10  <3 WBC/hpf   Bacteria, UA  MANY (*) RARE  CBC WITH DIFFERENTIAL      Result Value Range   WBC 11.4 (*) 4.0 - 10.5 K/uL   RBC 4.33  3.87 - 5.11 MIL/uL   Hemoglobin 14.1  12.0 - 15.0 g/dL   HCT 41.3  24.4 - 01.0 %   MCV 92.6  78.0 - 100.0 fL   MCH 32.6  26.0 - 34.0 pg   MCHC 35.2  30.0 - 36.0 g/dL   RDW 27.2  53.6 - 64.4 %   Platelets 233  150 - 400 K/uL   Neutrophils Relative 67  43 - 77 %   Neutro Abs 7.7  1.7 - 7.7 K/uL   Lymphocytes Relative 23  12 - 46 %   Lymphs Abs 2.6  0.7 - 4.0 K/uL   Monocytes Relative 9  3 - 12 %   Monocytes Absolute 1.0  0.1 - 1.0 K/uL   Eosinophils Relative 1  0 - 5 %   Eosinophils Absolute 0.1  0.0 - 0.7 K/uL   Basophils Relative 0  0 - 1 %   Basophils Absolute 0.0  0.0 - 0.1 K/uL  COMPREHENSIVE METABOLIC PANEL      Result Value Range   Sodium 140  135 - 145 mEq/L   Potassium 3.8  3.5 - 5.1 mEq/L   Chloride 106  96 - 112 mEq/L   CO2 24  19 - 32 mEq/L   Glucose, Bld 72  70 - 99 mg/dL   BUN 19  6 - 23 mg/dL   Creatinine, Ser 0.34  0.50 - 1.10 mg/dL   Calcium 9.2  8.4 - 74.2 mg/dL   Total Protein 7.0  6.0 - 8.3 g/dL   Albumin 3.4 (*) 3.5 - 5.2 g/dL   AST 45 (*) 0 - 37 U/L   ALT 22  0 - 35 U/L   Alkaline Phosphatase 110  39 - 117 U/L   Total Bilirubin 0.2 (*) 0.3 - 1.2 mg/dL   GFR calc non Af Amer >90  >90 mL/min   GFR calc Af Amer >90  >90 mL/min  LIPASE, BLOOD      Result Value Range   Lipase 67 (*) 11 - 59 U/L   Ct Abdomen Pelvis W Contrast  01/28/2013  *RADIOLOGY REPORT*  Clinical Data: Epigastric abdominal pain, radiating into the back. History of back pain.  GERD, gastritis, bulging disc.  CT ABDOMEN AND PELVIS  WITH CONTRAST  Technique:  Multidetector CT imaging of the abdomen and pelvis was performed following the standard protocol during bolus administration of intravenous contrast.  Contrast: 50mL OMNIPAQUE IOHEXOL 300 MG/ML  SOLN, OMNIPAQUE IOHEXOL 300 MG/ML  SOLN 06/10/2012  Comparison: 06/10/2012  Findings: Lung bases are unremarkable in appearance.  No focal abnormality identified within the liver, spleen, pancreas, adrenal glands, or kidneys.  The gallbladder is present.  The stomach and small bowel loops are normal in appearance. The appendix is well seen and has a normal appearance.  Colonic loops are normal in appearance.  The uterus is present.  Left ovary is prominent, measuring 3.2 x 5.1 cm and likely contains a small follicle.  Right adnexal region is unremarkable by CT. No retroperitoneal or mesenteric adenopathy. No evidence for aortic aneurysm.  Visualized osseous structures have a normal appearance.  IMPRESSION:  1.  No evidence for acute abnormality of the abdomen or pelvis. 2.  Suspect left ovarian follicle.  No evidence for malignancy.   Original Report Authenticated By: Norva Pavlov, M.D.  Lab and ultrasound non-revealing.  LFT's and lipase very slightly elevated.  Advised to avoid fatty foods.  She can take Percocet every 4 hours if needed for pain.  She has an appointment for abdominal ultrasound on Monday, March 17, at 9:45 A.M.      1. Abdominal pain     I personally performed the services described in this documentation, which was scribed in my presence. The recorded information has been reviewed and is accurate. Isabel Human, MD      Carleene Cooper III, MD 01/29/13 [phone]

## 2013-01-29 MED ORDER — OXYCODONE-ACETAMINOPHEN 5-325 MG PO TABS: 1.0000 | ORAL_TABLET | ORAL | Status: DC | PRN

## 2013-01-29 NOTE — ED Notes (Signed)
I noticed several small puncture marks/bruises on pt's left wrist, forearm and elbow just over veins. I asked pt if she had received any other medical tx or blood work recently. Pt denied same.When I asked her about a bruise on the wrist she said she just woke up with it.

## 2013-01-30 ENCOUNTER — Ambulatory Visit (HOSPITAL_COMMUNITY): Admit: 2013-01-30 | Payer: Medicaid Other

## 2013-01-31 ENCOUNTER — Encounter: Payer: Self-pay | Admitting: Gastroenterology

## 2013-01-31 LAB — URINE CULTURE
Colony Count: NO GROWTH
Culture: NO GROWTH

## 2013-02-01 ENCOUNTER — Encounter: Payer: Self-pay | Admitting: Gastroenterology

## 2013-02-01 ENCOUNTER — Ambulatory Visit (HOSPITAL_COMMUNITY)
Admission: RE | Admit: 2013-02-01 | Discharge: 2013-02-01 | Disposition: A | Discharge status: Home / Self Care | Payer: Medicaid Other | Source: Ambulatory Visit | Attending: Emergency Medicine | Admitting: Emergency Medicine

## 2013-02-01 ENCOUNTER — Ambulatory Visit (INDEPENDENT_AMBULATORY_CARE_PROVIDER_SITE_OTHER): Payer: Medicaid Other | Admitting: Gastroenterology

## 2013-02-01 VITALS — BP 129/80 | HR 100 | Temp 97.9°F | Ht 66.0 in | Wt 333.4 lb

## 2013-02-01 DIAGNOSIS — K625 Hemorrhage of anus and rectum: Secondary | ICD-10-CM

## 2013-02-01 DIAGNOSIS — R1011 Right upper quadrant pain: Secondary | ICD-10-CM | POA: Insufficient documentation

## 2013-02-01 DIAGNOSIS — K7689 Other specified diseases of liver: Secondary | ICD-10-CM | POA: Insufficient documentation

## 2013-02-01 MED ORDER — PANTOPRAZOLE SODIUM 40 MG PO TBEC: 40.0000 mg | DELAYED_RELEASE_TABLET | Freq: Two times a day (BID) | ORAL | Status: DC

## 2013-02-01 MED ORDER — OMEPRAZOLE 20 MG PO CPDR: 20.0000 mg | DELAYED_RELEASE_CAPSULE | Freq: Two times a day (BID) | ORAL | Status: DC

## 2013-02-01 MED ORDER — LINACLOTIDE 145 MCG PO CAPS: 145.0000 ug | ORAL_CAPSULE | Freq: Every day | ORAL | Status: DC

## 2013-02-01 NOTE — Progress Notes (Signed)
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 Notes esophageal burning, upper abdominal burning. Out of Prilosec, has been off about a month. Symptoms started last week. Prilosec is on her list, but she has not been taking it for about a month. +epigastric pain with eating, after eating. Notes as epigastric radiating around RUQ. +nausea. Present at least a year, had improved after March 2013 EGD with Dr. Darrick Penna. Recurrent symptoms as of last week. Still taking Naproxen BID. No melena. Stated she saw a streak of bright red blood in stool. No prior colonoscopy. Sees blood maybe up to twice a week. Has to strain sometimes. BM once per day. Feels somewhat constipated. Some days more than once, just depends.    [place] of abdomen February 01, 2013:  Fatty liver No gallstones Gallbladder without wall-thickening   Referring Provider: Alice Reichert, MD Primary Care Physician:  Alice Reichert, MD  Chief Complaint  Patient presents with  . Abdominal Pain  . Gastrophageal Reflux    HPI:    Past Medical History  Diagnosis Date  . Herpes   . GERD (gastroesophageal reflux disease)   . Gastritis   . Disk prolapse   . Bulging disc     Past Surgical History  Procedure Laterality Date  . Wisdom tooth extraction    . Esophagogastroduodenoscopy  01/19/2012    [id number] IN THE ANTRUM/HIATAL HERNIA/  . Bulging disc in back      Current Outpatient Prescriptions  Medication Sig Dispense Refill  . citalopram (CELEXA) 40 MG tablet Take 40 mg by mouth every morning.      . clonazePAM (KLONOPIN) 0.5 MG disintegrating tablet Take 0.5 mg by mouth 3 (three) times daily.       . naproxen (NAPROSYN) 500 MG tablet Take 1 tablet (500 mg total) by mouth 2 (two) times daily with a meal.  30 tablet  0  . omeprazole (PRILOSEC) 20 MG capsule Take 20 mg by mouth 2 (two) times daily. 1 po 30 minutes before breakfast AND SUPPER      . oxyCODONE-acetaminophen  (PERCOCET/ROXICET) 5-325 MG per tablet Take 1 tablet by mouth every 4 (four) hours as needed for pain.  20 tablet  0  . traZODone (DESYREL) 100 MG tablet Take 100 mg by mouth at bedtime.      . valACYclovir (VALTREX) 1000 MG tablet Take 1,000 mg by mouth at bedtime.        No current facility-administered medications for this visit.    Allergies as of 02/01/2013  . (No Known Allergies)    Family History  Problem Relation Age of Onset  . Diabetes Mother   . Diabetes Other   . Hypertension Other     History   Social History  . Marital Status: Single    Spouse Name: N/A    Number of Children: 1  . Years of Education: N/A   Occupational History  .    [employer] Unemployed    Social History Main Topics  . Smoking status: Former Smoker -- 3 years    Quit date: 07/28/2011  . Smokeless tobacco: Never Used  . Alcohol Use: No  . Drug Use: No  . Sexually Active: Yes    Birth Control/ Protection: Pill   Other Topics Concern  . None   Social History Narrative  . None    Review of Systems: Gen: Denies fever, chills, anorexia. Denies fatigue, weakness, weight loss.  CV: +chest discomfort  Resp: +SOB GI:  SEE HPI Derm: Denies rash, itching, dry skin Psych: stays depressed/anxious Heme: Denies bruising, bleeding, and enlarged lymph nodes.  Physical Exam: BP 129/80  Pulse 100  Temp(Src) 97.9 F (36.6 C) (Oral)  Ht 5\' 6"  (1.676 m)  Wt 333 lb 6.4 oz (151.229 kg)  BMI 53.84 kg/m2  LMP 02/01/2013 General:   Alert and oriented. No distress noted. Pleasant and cooperative.  Head:  Normocephalic and atraumatic. Eyes:  Conjuctiva clear without scleral icterus. Mouth:  Oral mucosa pink and moist. Good dentition. No lesions. Neck:  Supple, without mass or thyromegaly. Heart:  S1, S2 present without murmurs, rubs, or gallops. Regular rate and rhythm. Abdomen:  +BS, soft, non-tender and non-distended. No rebound or guarding. No HSM or masses noted. Msk:  Symmetrical without gross  deformities. Normal posture. Pulses:  2+ DP noted bilaterally Extremities:  Without edema. Neurologic:  Alert and  oriented x4;  grossly normal neurologically. Skin:  Intact without significant lesions or rashes. Cervical Nodes:  No significant cervical adenopathy. Psych:  Alert and cooperative. Normal mood and affect.

## 2013-02-01 NOTE — ED Provider Notes (Signed)
Pt seen after outpatient [place] No signs of acute cholecystitis Pt in no distress Advised need for followup and to return at anytime for recurrence/worsening pain   Joya Gaskins, MD 02/01/13 1053

## 2013-02-01 NOTE — Patient Instructions (Addendum)
I have changed your medication from Prilosec to Protonix (due to an interaction between Celexa and Prilosec potentially). Take Protonix twice a day, 30 minutes before meals.   Trial of Linzess 145 mcg capsule. Take 30 minutes before first meal of day. Stop if you have diarrhea.   We have scheduled a HIDA scan to assess your gallbladder.   Return in 4-6 weeks to see Dr. Darrick Penna to discuss a possible colonoscopy.  Follow the low-fat diet in the meantime.   Fat and Cholesterol Control Diet Cholesterol levels in your body are determined significantly by your diet. Cholesterol levels may also be related to heart disease. The following material helps to explain this relationship and discusses what you can do to help keep your heart healthy. Not all cholesterol is bad. Low-density lipoprotein (LDL) cholesterol is the "bad" cholesterol. It may cause fatty deposits to build up inside your arteries. High-density lipoprotein (HDL) cholesterol is "good." It helps to remove the "bad" LDL cholesterol from your blood. Cholesterol is a very important risk factor for heart disease. Other risk factors are high blood pressure, smoking, stress, heredity, and weight. The heart muscle gets its supply of blood through the coronary arteries. If your LDL cholesterol is high and your HDL cholesterol is low, you are at risk for having fatty deposits build up in your coronary arteries. This leaves less room through which blood can flow. Without sufficient blood and oxygen, the heart muscle cannot function properly and you may feel chest pains (angina pectoris). When a coronary artery closes up entirely, a part of the heart muscle may die causing a heart attack (myocardial infarction). CHECKING CHOLESTEROL When your caregiver sends your blood to a lab to be examined for cholesterol, a complete lipid (fat) profile may be done. With this test, the total amount of cholesterol and levels of LDL and HDL are determined. Triglycerides are  a type of fat that circulates in the blood. They can also be used to determine heart disease risk. The list below describes what the numbers should be: Test: Total Cholesterol.  Less than 200 mg/dl. Test: LDL "bad cholesterol."  Less than 100 mg/dl.  Less than 70 mg/dl if you are at very high risk of a heart attack or sudden cardiac death. Test: HDL "good cholesterol."  Greater than 50 mg/dl for women.  Greater than 40 mg/dl for men. Test: Triglycerides.  Less than 150 mg/dl. CONTROLLING CHOLESTEROL WITH DIET Although exercise and lifestyle factors are important, your diet is key. That is because certain foods are known to raise cholesterol and others to lower it. The goal is to balance foods for their effect on cholesterol and more importantly, to replace saturated and trans fat with other types of fat, such as monounsaturated fat, polyunsaturated fat, and omega-3 fatty acids. On average, a person should consume no more than 15 to 17 g of saturated fat daily. Saturated and trans fats are considered "bad" fats, and they will raise LDL cholesterol. Saturated fats are primarily found in animal products such as meats, butter, and cream. However, that does not mean you need to give up all your favorite foods. Today, there are good tasting, low-fat, low-cholesterol substitutes for most of the things you like to eat. Choose low-fat or nonfat alternatives. Choose round or loin cuts of red meat. These types of cuts are lowest in fat and cholesterol. Chicken (without the skin), fish, veal, and ground [place] breast are great choices. Eliminate fatty meats, such as hot dogs and salami. Even  shellfish have little or no saturated fat. Have a 3 oz (85 g) portion when you eat lean meat, poultry, or fish. Trans fats are also called "partially hydrogenated oils." They are oils that have been scientifically manipulated so that they are solid at room temperature resulting in a longer shelf life and improved taste  and texture of foods in which they are added. Trans fats are found in stick margarine, some tub margarines, cookies, crackers, and baked goods.  When baking and cooking, oils are a great substitute for butter. The monounsaturated oils are especially beneficial since it is believed they lower LDL and raise HDL. The oils you should avoid entirely are saturated tropical oils, such as coconut and palm.  Remember to eat a lot from food groups that are naturally free of saturated and trans fat, including fish, fruit, vegetables, beans, grains (barley, rice, couscous, bulgur wheat), and pasta (without cream sauces).  IDENTIFYING FOODS THAT LOWER CHOLESTEROL  Soluble fiber may lower your cholesterol. This type of fiber is found in fruits such as apples, vegetables such as broccoli, potatoes, and carrots, legumes such as beans, peas, and lentils, and grains such as barley. Foods fortified with plant sterols (phytosterol) may also lower cholesterol. You should eat at least 2 g per day of these foods for a cholesterol lowering effect.  Read package labels to identify low-saturated fats, trans fat free, and low-fat foods at the supermarket. Select cheeses that have only 2 to 3 g saturated fat per ounce. Use a heart-healthy tub margarine that is free of trans fats or partially hydrogenated oil. When buying baked goods (cookies, crackers), avoid partially hydrogenated oils. Breads and muffins should be made from whole grains (whole-wheat or whole oat flour, instead of "flour" or "enriched flour"). Buy non-creamy canned soups with reduced salt and no added fats.  FOOD PREPARATION TECHNIQUES  Never deep-fry. If you must fry, either stir-fry, which uses very little fat, or use non-stick cooking sprays. When possible, broil, bake, or roast meats, and steam vegetables. Instead of putting butter or margarine on vegetables, use lemon and herbs, applesauce, and cinnamon (for squash and sweet potatoes), nonfat yogurt, salsa, and  low-fat dressings for salads.  LOW-SATURATED FAT / LOW-FAT FOOD SUBSTITUTES Meats / Saturated Fat (g)  Avoid: Steak, marbled (3 oz/85 g) / 11 g  Choose: Steak, lean (3 oz/85 g) / 4 g  Avoid: Hamburger (3 oz/85 g) / 7 g  Choose: Hamburger, lean (3 oz/85 g) / 5 g  Avoid: Ham (3 oz/85 g) / 6 g  Choose: Ham, lean cut (3 oz/85 g) / 2.4 g  Avoid: Chicken, with skin, dark meat (3 oz/85 g) / 4 g  Choose: Chicken, skin removed, dark meat (3 oz/85 g) / 2 g  Avoid: Chicken, with skin, light meat (3 oz/85 g) / 2.5 g  Choose: Chicken, skin removed, light meat (3 oz/85 g) / 1 g Dairy / Saturated Fat (g)  Avoid: Whole milk (1 cup) / 5 g  Choose: Low-fat milk, 2% (1 cup) / 3 g  Choose: Low-fat milk, 1% (1 cup) / 1.5 g  Choose: Skim milk (1 cup) / 0.3 g  Avoid: Hard cheese (1 oz/28 g) / 6 g  Choose: Skim milk cheese (1 oz/28 g) / 2 to 3 g  Avoid: Cottage cheese, 4% fat (1 cup) / 6.5 g  Choose: Low-fat cottage cheese, 1% fat (1 cup) / 1.5 g  Avoid: Ice cream (1 cup) / 9 g  Choose: Sherbet (1  cup) / 2.5 g  Choose: Nonfat frozen yogurt (1 cup) / 0.3 g  Choose: Frozen fruit bar / trace  Avoid: Whipped cream (1 tbs) / 3.5 g  Choose: Nondairy whipped topping (1 tbs) / 1 g Condiments / Saturated Fat (g)  Avoid: Mayonnaise (1 tbs) / 2 g  Choose: Low-fat mayonnaise (1 tbs) / 1 g  Avoid: Butter (1 tbs) / 7 g  Choose: Extra light margarine (1 tbs) / 1 g  Avoid: Coconut oil (1 tbs) / 11.8 g  Choose: Olive oil (1 tbs) / 1.8 g  Choose: Corn oil (1 tbs) / 1.7 g  Choose: Safflower oil (1 tbs) / 1.2 g  Choose: Sunflower oil (1 tbs) / 1.4 g  Choose: Soybean oil (1 tbs) / 2.4 g  Choose: Canola oil (1 tbs) / 1 g Document Released: 11/02/2005 Document Revised: 01/25/2012 Document Reviewed: 04/23/2011 [facility] Patient Information 2013 [place], [place].

## 2013-02-03 ENCOUNTER — Encounter (HOSPITAL_COMMUNITY): Payer: Self-pay

## 2013-02-03 ENCOUNTER — Encounter (HOSPITAL_COMMUNITY)
Admission: RE | Admit: 2013-02-03 | Discharge: 2013-02-03 | Disposition: A | Discharge status: Home / Self Care | Payer: Medicaid Other | Source: Ambulatory Visit | Attending: Gastroenterology | Admitting: Gastroenterology

## 2013-02-03 DIAGNOSIS — K625 Hemorrhage of anus and rectum: Secondary | ICD-10-CM | POA: Insufficient documentation

## 2013-02-03 DIAGNOSIS — R109 Unspecified abdominal pain: Secondary | ICD-10-CM

## 2013-02-03 DIAGNOSIS — R1031 Right lower quadrant pain: Secondary | ICD-10-CM | POA: Insufficient documentation

## 2013-02-03 MED ORDER — TECHNETIUM TC 99M MEBROFENIN IV KIT
5.0000 | PACK | Freq: Once | INTRAVENOUS | Status: AC | PRN
  Administered 2013-02-03: 5 via INTRAVENOUS

## 2013-02-03 NOTE — Assessment & Plan Note (Signed)
Scant hematochezia in the setting of constipation. Likely benign, but she has never had a lower GI evaluation. Start Linzess 145 mcg daily. Return in 6 weeks to discuss colonoscopy.

## 2013-02-03 NOTE — Assessment & Plan Note (Signed)
Recurrent after cessation of Prilosec X 1 month. Pt is obese and continues to gain weight, adding to severity of symptoms. Change to Protonix BID instead of Prilosec. Return in 6 weeks.

## 2013-02-03 NOTE — Assessment & Plan Note (Signed)
27 year old obese female with history of ulcers secondary to NSAIDs, now presenting with epigastric pain, RUQ pain, nausea, worse with eating. [place] of abdomen with fatty liver, no stones, CT negative, AST mildly elevated otherwise normal LFTs. She has been off Prilosec for about a month with recurrent GERD. SHE IS UP 23 POUNDS SINCE LAST YEAR. Still taking Naproxen BID. Likely symptoms due to gastritis, NSAID injury, possible biliary etiology.   Restart PPI at BID dosing. Switched to [employer]. HIDA scan Return in 6 weeks LOW FAT DIET

## 2013-02-03 NOTE — Progress Notes (Signed)
Referring Provider: Alice Reichert, MD Primary Care Physician:  Alice Reichert, MD Primary Gastroenterologist: Dr. Darrick Penna   Chief Complaint  Patient presents with  . Abdominal Pain  . 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 EGD performed March 2013 by Dr. Darrick Penna.  Notes esophageal burning, upper abdominal burning. Out of Prilosec, has been off about a month. Symptoms started last week. Prilosec is on her list, but she has not been taking it for about a month. +epigastric pain with eating, after eating. Notes as epigastric radiating around RUQ. +nausea. Present at least a year, had improved after March 2013 EGD with Dr. Darrick Penna. Recurrent symptoms as of last week. Still taking Naproxen BID. No melena. Stated she saw a streak of bright red blood in stool. No prior colonoscopy. Sees blood maybe up to twice a week. Has to strain sometimes. BM once per day. Feels somewhat constipated. Some days more than once, just depends.   Her weight is up 23 pounds from last February.   [place] of abdomen February 01, 2013:  Fatty liver No gallstones Gallbladder without wall-thickening  CT March 2014: no acute abnormality of abdomen. Suspected left ovarian follicle.   Past Medical History  Diagnosis Date  . Herpes   . GERD (gastroesophageal reflux disease)   . Gastritis   . Disk prolapse   . Bulging disc     Past Surgical History  Procedure Laterality Date  . Wisdom tooth extraction    . Esophagogastroduodenoscopy  01/19/2012    [id number] IN THE ANTRUM/HIATAL HERNIA/  . Bulging disc in back      Current Outpatient Prescriptions  Medication Sig Dispense Refill  . citalopram (CELEXA) 40 MG tablet Take 40 mg by mouth every morning.      . clonazePAM (KLONOPIN) 0.5 MG disintegrating tablet Take 0.5 mg by mouth 3 (three) times daily.       . naproxen (NAPROSYN) 500 MG tablet Take 1 tablet (500 mg total) by mouth 2  (two) times daily with a meal.  30 tablet  0  . omeprazole (PRILOSEC) 20 MG capsule Take 20 mg by mouth 2 (two) times daily. 1 po 30 minutes before breakfast AND SUPPER      . oxyCODONE-acetaminophen (PERCOCET/ROXICET) 5-325 MG per tablet Take 1 tablet by mouth every 4 (four) hours as needed for pain.  20 tablet  0  . traZODone (DESYREL) 100 MG tablet Take 100 mg by mouth at bedtime.      . valACYclovir (VALTREX) 1000 MG tablet Take 1,000 mg by mouth at bedtime.        No current facility-administered medications for this visit.    Allergies as of 02/01/2013  . (No Known Allergies)    Family History  Problem Relation Age of Onset  . Diabetes Mother   . Diabetes Other   . Hypertension Other     History   Social History  . Marital Status: Single    Spouse Name: N/A    Number of Children: 1  . Years of Education: N/A   Occupational History  .    [employer] Unemployed    Social History Main Topics  . Smoking status: Former Smoker -- 3 years    Quit date: 07/28/2011  . Smokeless tobacco: Never Used  . Alcohol Use: No  . Drug Use: No  . Sexually Active: Yes    Birth Control/ Protection: Pill   Other Topics  Concern  . None   Social History Narrative  . None    Review of Systems: Gen: Denies fever, chills, anorexia. Denies fatigue, weakness, weight loss.  CV: +chest discomfort  Resp: +SOB GI: SEE HPI Derm: Denies rash, itching, dry skin Psych: stays depressed/anxious Heme: Denies bruising, bleeding, and enlarged lymph nodes.  Physical Exam: BP 129/80  Pulse 100  Temp(Src) 97.9 F (36.6 C) (Oral)  Ht 5\' 6"  (1.676 m)  Wt 333 lb 6.4 oz (151.229 kg)  BMI 53.84 kg/m2  LMP 02/01/2013 General:   Alert and oriented. No distress noted. Pleasant and cooperative. Obese.  Head:  Normocephalic and atraumatic. Eyes:  Conjuctiva clear without scleral icterus. Mouth:  Oral mucosa pink and moist. Good dentition. No lesions. Neck:  Supple, without mass or thyromegaly. Heart:  S1,  S2 present without murmurs, rubs, or gallops. Regular rate and rhythm. Abdomen:  +BS, soft, obese, TTP epigastric and RUQ and non-distended. No rebound or guarding. Difficult to appreciate any HSM or mass due to obesity.  Msk:  Symmetrical without gross deformities. Normal posture. Extremities:  Without edema. Neurologic:  Alert and  oriented x4;  grossly normal neurologically. Skin:  Intact without significant lesions or rashes. Cervical Nodes:  No significant cervical adenopathy. Psych:  Alert and cooperative. Normal mood and affect.  Lab Results  Component Value Date   WBC 11.4* 01/28/2013   HGB 14.1 01/28/2013   HCT 40.1 01/28/2013   MCV 92.6 01/28/2013   PLT 233 01/28/2013   Lab Results  Component Value Date   ALT 22 01/28/2013   AST 45* 01/28/2013   ALKPHOS 110 01/28/2013   BILITOT 0.2* 01/28/2013

## 2013-02-06 ENCOUNTER — Telehealth: Payer: Self-pay

## 2013-02-06 MED ORDER — LUBIPROSTONE 8 MCG PO CAPS: 8.0000 ug | ORAL_CAPSULE | Freq: Two times a day (BID) | ORAL | Status: DC

## 2013-02-06 NOTE — Telephone Encounter (Signed)
Pt left VM that she would like to have results of HIDA done on 02/03/2013.

## 2013-02-06 NOTE — Telephone Encounter (Signed)
Sent in Amitiza po BID, one month supply. Have patient give [place] a PR in about 2-3 weeks.

## 2013-02-06 NOTE — Progress Notes (Signed)
Faxed to PCP

## 2013-02-06 NOTE — Progress Notes (Signed)
Quick Note:  Normal HIDA.  Needs to continue PPI BID, follow low-fat diet, keep appt with [place] upcoming in next few weeks. NO NSAIDs. ______

## 2013-02-06 NOTE — Telephone Encounter (Signed)
I have routed to [facility] under result notes.

## 2013-02-06 NOTE — Telephone Encounter (Signed)
Received letter from pharmacy. Pt has [place] Medicaid and they are not covering linzess yet and pt has not tried [place]. Do you want to send new rx for amitiza?

## 2013-02-07 NOTE — Telephone Encounter (Signed)
Routing to [place], she has LM for pt this morning about some test results.

## 2013-02-07 NOTE — Telephone Encounter (Signed)
Pt called and I informed her. ( She said the Linzess gave her diarrhea anyway).

## 2013-02-07 NOTE — Progress Notes (Signed)
Quick Note:  LMOM to call. ______ 

## 2013-02-07 NOTE — Progress Notes (Signed)
Quick Note:  Pt returned call and was informed of results. ______ 

## 2013-02-08 ENCOUNTER — Other Ambulatory Visit: Payer: Self-pay | Admitting: Gastroenterology

## 2013-02-08 ENCOUNTER — Telehealth: Payer: Self-pay | Admitting: Gastroenterology

## 2013-02-08 ENCOUNTER — Telehealth: Payer: Self-pay | Admitting: *Deleted

## 2013-02-08 DIAGNOSIS — K625 Hemorrhage of anus and rectum: Secondary | ICD-10-CM

## 2013-02-08 DIAGNOSIS — R109 Unspecified abdominal pain: Secondary | ICD-10-CM

## 2013-02-08 DIAGNOSIS — K219 Gastro-esophageal reflux disease without esophagitis: Secondary | ICD-10-CM

## 2013-02-08 MED ORDER — PEG 3350-KCL-NA BICARB-NACL 420 G PO SOLR: 4000.0000 mL | ORAL | Status: DC

## 2013-02-08 NOTE — Telephone Encounter (Signed)
I LMOM for patient to return my call.

## 2013-02-08 NOTE — Telephone Encounter (Signed)
Pt called to let DS know that she needs her tcs/egd scheduled for a Friday. [phone]

## 2013-02-08 NOTE — Telephone Encounter (Signed)
Pt aware the procedures will be scheduled.

## 2013-02-08 NOTE — Telephone Encounter (Signed)
Pt returned call. Said she is having the abdominal pain in the center of her stomach and it is constant. She is taking Protonix bid and the Amitiza bid and following a low fat diet. She is having about 3 BM's daily, but they are all very firm, and she still sees some blood when she wipes. Please advise!

## 2013-02-08 NOTE — Telephone Encounter (Signed)
[name] called today. She is still experiencing a lot of stomach pain, and doesn't think that the medicine we prescribed is helping her. She saw the surgeon the other day and he told her that her gall bladder looks normal. Please follow up with her. Thank you.

## 2013-02-08 NOTE — Telephone Encounter (Signed)
Patient is scheduled for Friday April 11th with [facility] and she is aware and I have mailed her the instructions

## 2013-02-08 NOTE — Telephone Encounter (Signed)
Set up for surveillance EGD due to history of PUD, still taking NSAIDs. Needs TCS at time of EGD due to rectal bleeding. TCS/EGD with [facility]>

## 2013-02-08 NOTE — Telephone Encounter (Signed)
Routing to Leigh Ann 

## 2013-02-08 NOTE — Telephone Encounter (Signed)
Called, lmom for a return call.

## 2013-02-10 ENCOUNTER — Emergency Department (HOSPITAL_COMMUNITY): Payer: Medicaid Other

## 2013-02-10 ENCOUNTER — Encounter (HOSPITAL_COMMUNITY): Payer: Self-pay | Admitting: *Deleted

## 2013-02-10 ENCOUNTER — Emergency Department (HOSPITAL_COMMUNITY)
Admission: EM | Admit: 2013-02-10 | Discharge: 2013-02-11 | Disposition: A | Discharge status: Home / Self Care | Payer: Medicaid Other | Attending: Emergency Medicine | Admitting: Emergency Medicine

## 2013-02-10 ENCOUNTER — Encounter (HOSPITAL_COMMUNITY): Payer: Self-pay | Admitting: Pharmacy Technician

## 2013-02-10 DIAGNOSIS — R197 Diarrhea, unspecified: Secondary | ICD-10-CM | POA: Insufficient documentation

## 2013-02-10 DIAGNOSIS — K921 Melena: Secondary | ICD-10-CM | POA: Insufficient documentation

## 2013-02-10 DIAGNOSIS — Z79899 Other long term (current) drug therapy: Secondary | ICD-10-CM | POA: Insufficient documentation

## 2013-02-10 DIAGNOSIS — Z87891 Personal history of nicotine dependence: Secondary | ICD-10-CM | POA: Insufficient documentation

## 2013-02-10 DIAGNOSIS — R109 Unspecified abdominal pain: Secondary | ICD-10-CM | POA: Insufficient documentation

## 2013-02-10 DIAGNOSIS — R110 Nausea: Secondary | ICD-10-CM | POA: Insufficient documentation

## 2013-02-10 DIAGNOSIS — Z8739 Personal history of other diseases of the musculoskeletal system and connective tissue: Secondary | ICD-10-CM | POA: Insufficient documentation

## 2013-02-10 DIAGNOSIS — R51 Headache: Secondary | ICD-10-CM | POA: Insufficient documentation

## 2013-02-10 DIAGNOSIS — Z8619 Personal history of other infectious and parasitic diseases: Secondary | ICD-10-CM | POA: Insufficient documentation

## 2013-02-10 DIAGNOSIS — Z8719 Personal history of other diseases of the digestive system: Secondary | ICD-10-CM | POA: Insufficient documentation

## 2013-02-10 DIAGNOSIS — Z3202 Encounter for pregnancy test, result negative: Secondary | ICD-10-CM | POA: Insufficient documentation

## 2013-02-10 LAB — PREGNANCY, URINE: Preg Test, Ur: NEGATIVE

## 2013-02-10 MED ORDER — SODIUM CHLORIDE 0.9 % IV SOLN
Freq: Once | INTRAVENOUS | Status: AC
  Administered 2013-02-11: 1000 mL via INTRAVENOUS

## 2013-02-10 MED ORDER — SODIUM CHLORIDE 0.9 % IV BOLUS (SEPSIS)
1000.0000 mL | Freq: Once | INTRAVENOUS | Status: AC
  Administered 2013-02-10: 1000 mL via INTRAVENOUS

## 2013-02-10 MED ORDER — HYDROMORPHONE HCL PF 1 MG/ML IJ SOLN
1.0000 mg | Freq: Once | INTRAMUSCULAR | Status: AC
  Administered 2013-02-10: 1 mg via INTRAVENOUS
  Filled 2013-02-10: qty 1

## 2013-02-10 NOTE — ED Provider Notes (Signed)
History    This chart was scribed for [employer]. Colon Branch, MD by Toya Smothers, ED Scribe. The patient was seen in room APA08/APA08. Patient's care was started at 2141.  CSN: [id number]  Arrival date & time 02/10/13  2141   First MD Initiated Contact with Patient 02/10/13 2300      Chief Complaint  Patient presents with  . Rectal Bleeding    Patient is a 27 y.o. female presenting with hematochezia. The history is provided by the patient. No language interpreter was used.  Rectal Bleeding  Associated symptoms include abdominal pain. Pertinent negatives include no fever, no vomiting, no chest pain, no headaches, no coughing and no rash.    KELSY POLACK is a 27 y.o. female with h/o herpes and GERD,  who presents to the Emergency Department complaining of 2 weeks of intermittent right upper abdominal pain with HA, nausea, diarrhea, and anal bleeding. Pain is sharp. No fever, chills, cough, congestion, rhinorrhea, chest pain, SOB, or n/v/d. Last bowel movement yesterday. Pt denies use of tobacco, alcohol, and illicit drug use. Pt is sexually active. Pt reports that she has a colonoscopy scheduled for 02/24/13.  PCP Dr. Renard Matter   Past Medical History  Diagnosis Date  . Herpes   . GERD (gastroesophageal reflux disease)   . Gastritis   . Disk prolapse   . Bulging disc     Past Surgical History  Procedure Laterality Date  . Wisdom tooth extraction    . Esophagogastroduodenoscopy  01/19/2012    NWG:NFAOZH,YQMVHQIO IN THE ANTRUM/HIATAL HERNIA/  . Bulging disc in back      Family History  Problem Relation Age of Onset  . Diabetes Mother   . Diabetes Other   . Hypertension Other     History  Substance Use Topics  . Smoking status: Former Smoker -- 3 years    Quit date: 07/28/2011  . Smokeless tobacco: Never Used  . Alcohol Use: No    OB History   Grav Para Term Preterm Abortions TAB SAB Ect Mult Living   1 1 1       1       Review of Systems  Constitutional: Negative for  fever.       10 Systems reviewed and are negative for acute change except as noted in the HPI.  HENT: Negative for congestion.   Eyes: Negative for discharge and redness.  Respiratory: Negative for cough and shortness of breath.   Cardiovascular: Negative for chest pain.  Gastrointestinal: Positive for abdominal pain, blood in stool and hematochezia. Negative for vomiting.  Musculoskeletal: Negative for back pain.  Skin: Negative for rash.  Neurological: Negative for syncope, numbness and headaches.  Psychiatric/Behavioral:       No behavior change.  All other systems reviewed and are negative.    Allergies  Review of patient's allergies indicates no known allergies.  Home Medications   Current Outpatient Rx  Name  Route  Sig  Dispense  Refill  . citalopram (CELEXA) 40 MG tablet   Oral   Take 40 mg by mouth every morning.         . clonazePAM (KLONOPIN) 0.5 MG disintegrating tablet   Oral   Take 0.5 mg by mouth 3 (three) times daily.          Marland Kitchen lubiprostone (AMITIZA) 8 MCG capsule   Oral   Take 1 capsule (8 mcg total) by mouth 2 (two) times daily with a meal.   60 capsule   1   .  pantoprazole (PROTONIX) 40 MG tablet   Oral   Take 1 tablet (40 mg total) by mouth 2 (two) times daily.   60 tablet   3   . polyethylene glycol-electrolytes (TRILYTE) 420 G solution   Oral   Take 4,000 mLs by mouth as directed.   4000 mL   0   . traZODone (DESYREL) 100 MG tablet   Oral   Take 100 mg by mouth at bedtime.         . valACYclovir (VALTREX) 1000 MG tablet   Oral   Take 1,000 mg by mouth at bedtime.            BP 116/81  Pulse 101  Temp(Src) 99 F (37.2 C) (Oral)  Resp 20  Ht 5\' 6"  (1.676 m)  Wt 333 lb (151.048 kg)  BMI 53.77 kg/m2  SpO2 97%  LMP 02/03/2013  Physical Exam  Nursing note and vitals reviewed. Constitutional: She is oriented to person, place, and time. She appears well-developed and well-nourished.  HENT:  Head: Normocephalic and  atraumatic.  Eyes: Conjunctivae and EOM are normal. Pupils are equal, round, and reactive to light.  Cardiovascular: Normal rate, regular rhythm and normal heart sounds.   Pulmonary/Chest: Effort normal and breath sounds normal. She has no rales.  Abdominal: Soft. Bowel sounds are normal.  Right sided abdominal with palpation.  Musculoskeletal: Normal range of motion.  Neurological: She is alert and oriented to person, place, and time.  Skin: Skin is warm and dry.  Dime size shallow lesion to the left cheek that was preceded by a small white pimple that was squeezed.  Psychiatric: She has a normal mood and affect.    ED Course  Procedures Results for orders placed during the hospital encounter of 02/10/13  CBC WITH DIFFERENTIAL      Result Value Range   WBC 7.3  4.0 - 10.5 K/uL   RBC 4.46  3.87 - 5.11 MIL/uL   Hemoglobin 14.3  12.0 - 15.0 g/dL   HCT 16.1  09.6 - 04.5 %   MCV 91.9  78.0 - 100.0 fL   MCH 32.1  26.0 - 34.0 pg   MCHC 34.9  30.0 - 36.0 g/dL   RDW 40.9  81.1 - 91.4 %   Platelets 229  150 - 400 K/uL   Neutrophils Relative 89 (*) 43 - 77 %   Neutro Abs 6.5  1.7 - 7.7 K/uL   Lymphocytes Relative 9 (*) 12 - 46 %   Lymphs Abs 0.7  0.7 - 4.0 K/uL   Monocytes Relative 1 (*) 3 - 12 %   Monocytes Absolute 0.1  0.1 - 1.0 K/uL   Eosinophils Relative 0  0 - 5 %   Eosinophils Absolute 0.0  0.0 - 0.7 K/uL   Basophils Relative 0  0 - 1 %   Basophils Absolute 0.0  0.0 - 0.1 K/uL  BASIC METABOLIC PANEL      Result Value Range   Sodium 137  135 - 145 mEq/L   Potassium 3.9  3.5 - 5.1 mEq/L   Chloride 105  96 - 112 mEq/L   CO2 21  19 - 32 mEq/L   Glucose, Bld 166 (*) 70 - 99 mg/dL   BUN 12  6 - 23 mg/dL   Creatinine, Ser 7.82  0.50 - 1.10 mg/dL   Calcium 9.7  8.4 - 95.6 mg/dL   GFR calc non Af Amer >90  >90 mL/min   GFR calc Af Amer >  90  >90 mL/min  PREGNANCY, URINE      Result Value Range   Preg Test, Ur NEGATIVE  NEGATIVE    DIAGNOSTIC STUDIES: Oxygen Saturation is 97%  on room air, normal by my interpretation.    COORDINATION OF CARE: 23:05- Evaluated Pt. Pt is awake, alert, and without distress. 23:13- Patient understand and agree with initial ED impression and plan with expectations set for ED visit.  Dg Abd Acute W/chest  02/11/2013  *RADIOLOGY REPORT*  Clinical Data:  Right epigastric pain, rectal bleeding, low grade fever, symptoms for 2 weeks  ACUTE ABDOMEN SERIES (ABDOMEN 2 VIEW & CHEST 1 VIEW)  Comparison: Chest radiograph 01/03/2012, abdominal radiographs 11/28/2011  Findings: Normal heart size, mediastinal contours, pulmonary vascularity. Lungs clear. No pleural effusion or pneumothorax. Normal bowel gas pattern. No bowel dilatation, bowel wall thickening or free intraperitoneal air. No urinary tract calcification or acute osseous findings.  IMPRESSION: No acute abnormalities.   Original Report Authenticated By: Ulyses Southward, M.D.         MDM  Patient with abdominal pain x 2 weeks and rectal bleeding with bowel movements x 2 weeks. She is scheduled for colonoscopy with Dr. Darrick Penna 02/14/2013. Labs are normal, acute abdominal series is normal. She has a lesion on the left side of her face that is most likely MRSA. Will begin treatment with Septra. Acute abdominal series unremarkable. Reviewed results with patient. Pt stable in ED with no significant deterioration in condition.The patient appears reasonably screened and/or stabilized for discharge and I doubt any other medical condition or other [facility] requiring further screening, evaluation, or treatment in the ED at this time prior to discharge.  I personally performed the services described in this documentation, which was scribed in my presence. The recorded information has been reviewed and considered.  MDM Reviewed: nursing note and vitals Interpretation: labs and x-ray       Nicoletta Dress. Colon Branch, MD 02/11/13 6213

## 2013-02-10 NOTE — ED Notes (Signed)
abd pain, headache, and rectal bleeding.  Plans colonoscopy 4/11

## 2013-02-11 LAB — CBC WITH DIFFERENTIAL/PLATELET
Basophils Absolute: 0 10*3/uL (ref 0.0–0.1)
Basophils Relative: 0 % (ref 0–1)
Eosinophils Relative: 0 % (ref 0–5)
Lymphocytes Relative: 9 % — ABNORMAL LOW (ref 12–46)
MCHC: 34.9 g/dL (ref 30.0–36.0)
Neutro Abs: 6.5 10*3/uL (ref 1.7–7.7)
Platelets: 229 10*3/uL (ref 150–400)
RDW: 13.6 % (ref 11.5–15.5)
WBC: 7.3 10*3/uL (ref 4.0–10.5)

## 2013-02-11 LAB — BASIC METABOLIC PANEL
CO2: 21 mEq/L (ref 19–32)
Calcium: 9.7 mg/dL (ref 8.4–10.5)
Chloride: 105 mEq/L (ref 96–112)
GFR calc Af Amer: >90 mL/min (ref >90)
Sodium: 137 mEq/L (ref 135–145)

## 2013-02-11 MED ORDER — ONDANSETRON 4 MG PO TBDP: 4.0000 mg | ORAL_TABLET | Freq: Three times a day (TID) | ORAL | Status: DC | PRN

## 2013-02-11 MED ORDER — SULFAMETHOXAZOLE-TRIMETHOPRIM 800-160 MG PO TABS: 1.0000 | ORAL_TABLET | Freq: Two times a day (BID) | ORAL | Status: AC

## 2013-02-11 MED ORDER — HYDROMORPHONE HCL PF 1 MG/ML IJ SOLN
1.0000 mg | Freq: Once | INTRAMUSCULAR | Status: AC
  Administered 2013-02-11: 1 mg via INTRAVENOUS
  Filled 2013-02-11: qty 1

## 2013-02-23 MED ORDER — SODIUM CHLORIDE 0.9 % IV SOLN
INTRAVENOUS | Status: DC
  Administered 2013-02-24: 08:00:00 via INTRAVENOUS

## 2013-02-24 ENCOUNTER — Ambulatory Visit (HOSPITAL_COMMUNITY)
Admission: RE | Admit: 2013-02-24 | Discharge: 2013-02-24 | Disposition: A | Discharge status: Home / Self Care | Payer: Medicaid Other | Source: Ambulatory Visit | Attending: Gastroenterology | Admitting: Gastroenterology

## 2013-02-24 ENCOUNTER — Encounter (HOSPITAL_COMMUNITY)
Admission: RE | Disposition: A | Discharge status: Home / Self Care | Payer: Self-pay | Source: Ambulatory Visit | Attending: Gastroenterology

## 2013-02-24 ENCOUNTER — Encounter (HOSPITAL_COMMUNITY): Payer: Self-pay | Admitting: *Deleted

## 2013-02-24 DIAGNOSIS — K208 Other esophagitis: Secondary | ICD-10-CM

## 2013-02-24 DIAGNOSIS — K228 Other specified diseases of esophagus: Secondary | ICD-10-CM | POA: Insufficient documentation

## 2013-02-24 DIAGNOSIS — K219 Gastro-esophageal reflux disease without esophagitis: Secondary | ICD-10-CM

## 2013-02-24 DIAGNOSIS — K2289 Other specified disease of esophagus: Secondary | ICD-10-CM | POA: Insufficient documentation

## 2013-02-24 DIAGNOSIS — K625 Hemorrhage of anus and rectum: Secondary | ICD-10-CM | POA: Insufficient documentation

## 2013-02-24 DIAGNOSIS — R1013 Epigastric pain: Secondary | ICD-10-CM | POA: Insufficient documentation

## 2013-02-24 DIAGNOSIS — K2960 Other gastritis without bleeding: Secondary | ICD-10-CM | POA: Insufficient documentation

## 2013-02-24 DIAGNOSIS — R109 Unspecified abdominal pain: Secondary | ICD-10-CM

## 2013-02-24 DIAGNOSIS — K5900 Constipation, unspecified: Secondary | ICD-10-CM

## 2013-02-24 DIAGNOSIS — K648 Other hemorrhoids: Secondary | ICD-10-CM

## 2013-02-24 HISTORY — PX: COLONOSCOPY WITH ESOPHAGOGASTRODUODENOSCOPY (EGD): SHX5779

## 2013-02-24 SURGERY — COLONOSCOPY WITH ESOPHAGOGASTRODUODENOSCOPY (EGD)
Anesthesia: Moderate Sedation

## 2013-02-24 MED ORDER — SIMETHICONE 40 MG/0.6ML PO SUSP
ORAL | Status: DC | PRN
  Administered 2013-02-24: 09:00:00

## 2013-02-24 MED ORDER — MIDAZOLAM HCL 5 MG/5ML IJ SOLN
INTRAMUSCULAR | Status: DC | PRN
  Administered 2013-02-24 (×2): 2 mg via INTRAVENOUS

## 2013-02-24 MED ORDER — MEPERIDINE HCL 100 MG/ML IJ SOLN
INTRAMUSCULAR | Status: DC | PRN
  Administered 2013-02-24: 50 mg via INTRAVENOUS
  Administered 2013-02-24: 25 mg via INTRAVENOUS

## 2013-02-24 MED ORDER — MIDAZOLAM HCL 5 MG/5ML IJ SOLN
INTRAMUSCULAR | Status: AC
  Filled 2013-02-24: qty 10

## 2013-02-24 MED ORDER — MEPERIDINE HCL 100 MG/ML IJ SOLN
INTRAMUSCULAR | Status: AC
  Filled 2013-02-24: qty 2

## 2013-02-24 NOTE — Op Note (Signed)
[facility] [address] [place], [address]   ENDOSCOPY PROCEDURE REPORT  PATIENT: Isabel, Harrington  MR#: [id number] BIRTHDATE: Sep 16, 1986 , 26  yrs. old GENDER: Female  ENDOSCOPIST: Jonette Eva, MD REFERRED XB:[id number] Renard Matter, M.D.  PROCEDURE DATE: 02/24/2013 PROCEDURE:   EGD w/ biopsy  INDICATIONS:Heartburn.   Epigastric pain.  STOPPED TAKING PRILOSEC. CONTINUED NAPROXEN BID. SINCE 2013 GAINED 23 LBS & BMI 53. MEDICATIONS: TCS + Demerol 25 mg IV TOPICAL ANESTHETIC:   Cetacaine Spray  DESCRIPTION OF PROCEDURE:     Physical exam was performed.  Informed consent was obtained from the patient after explaining the benefits, risks, and alternatives to the procedure.  The patient was connected to the monitor and placed in the left lateral position.  Continuous oxygen was provided by nasal cannula and IV medicine administered through an indwelling cannula.  After administration of sedation, the patients esophagus was intubated and the EC-3890Li ([id number]) and EG-2990i ([id number])  endoscope was advanced under direct visualization to the second portion of the duodenum.  The scope was removed slowly by carefully examining the color, texture, anatomy, and integrity of the mucosa on the way out.  The patient was recovered in endoscopy and discharged home in satisfactory condition.   ESOPHAGUS: Three small linear ulcers were found in the lower third of the esophagus.  Biopsies were taken.   STOMACH: Moderate erosive gastritis (inflammation) with hemorrhage was found in the gastric body and gastric antrum.  Multiple biopsies were performed using cold forceps.   DUODENUM: The duodenal mucosa showed no abnormalities in the bulb and second portion of the duodenum.  COMPLICATIONS:   None  ENDOSCOPIC IMPRESSION: 1.   EROSIVE ESOHAGITIS DUE TO UNCONTROLLED REFLUX 2.   MODERATE Erosive gastritis MOST LIKELY DUE TO NAPROZEN USE W/O A PPI  RECOMMENDATIONS: LOSE 20  LBS. CONTINUE PROTONIX.  TAKE 30 MINUTES PRIOR TO MEALS TWICE DAILY. AVOID ITEMS THAT TRIGGER GASTRITIS. DRINK WATER TO KEEP YOUR URINE LIGHT YELLOW. FOLLOW A LOW FAT/HIGH FIBER DIET.  AVOID ITEMS THAT CAUSE BLOATING.  BIOPSY WILL BE BACK IN 7 DAYS. FOLLOW UP IN AUG 2014.   REPEAT EXAM:   _______________________________ Rosalie DoctorJonette Eva, MD 02/24/2013 11:28 AM       PATIENT NAME:  [name] MR#: [id number]

## 2013-02-24 NOTE — Op Note (Signed)
[facility] [address] [place], [address]   COLONOSCOPY PROCEDURE REPORT  PATIENT: [name], [name]  MR#: [id number] BIRTHDATE: 11/22/1985 , 26  yrs. old GENDER: Female ENDOSCOPIST: Jonette Eva, MD REFERRED QI:ONGEX Renard Matter, M.D. PROCEDURE DATE:  02/24/2013 PROCEDURE:   Colonoscopy, diagnostic INDICATIONS:Rectal Bleeding and Constipation. MEDICATIONS: Demerol 50 mg IV and Versed 4 mg IV  DESCRIPTION OF PROCEDURE:    Physical exam was performed.  Informed consent was obtained from the patient after explaining the benefits, risks, and alternatives to procedure.  The patient was connected to monitor and placed in left lateral position. Continuous oxygen was provided by nasal cannula and IV medicine administered through an indwelling cannula.  After administration of sedation and rectal exam, the patients rectum was intubated and the EG-2990i ([id number]) and EC-3890Li ([id number])  colonoscope was advanced under direct visualization to the ileum.  The scope was removed slowly by carefully examining the color, texture, anatomy, and integrity mucosa on the way out.  The patient was recovered in endoscopy and discharged home in satisfactory condition.    COLON FINDINGS: The mucosa appeared normal in the terminal ileum.  , The colon was otherwise normal.  There was no diverticulosis, inflammation, polyps or cancers unless previously stated.  , and Moderate sized internal hemorrhoids were found.  PREP QUALITY: excellent. CECAL W/D TIME: 10 minutes  COMPLICATIONS: None  ENDOSCOPIC IMPRESSION: 1.   INTERMITTENT RECTAL BLEEDING DUE TO Moderate sized internal hemorrhoids  RECOMMENDATIONS: LOSE WEIGHT HIGH FIBER DIET AVOID CONSTIPATION DRINK WATER AMITIZA BID OPV IN 4 MOS       _______________________________ eSignedJonette Eva, MD 02/24/2013 9:37 AM

## 2013-02-24 NOTE — H&P (Signed)
  Primary Care Physician:  Alice Reichert, MD Primary Gastroenterologist:  Dr. Darrick Penna  Pre-Procedure History & Physical: HPI:  [name] is a 27 y.o. female here for BRBPR/ABDOMINAL PAIN.  Past Medical History  Diagnosis Date  . Herpes   . GERD (gastroesophageal reflux disease)   . Gastritis   . Disk prolapse   . Bulging disc     Past Surgical History  Procedure Laterality Date  . Wisdom tooth extraction    . Esophagogastroduodenoscopy  01/19/2012    ZOX:[id number],[id number] IN THE ANTRUM/HIATAL HERNIA/    Prior to Admission medications   Medication Sig Start Date End Date Taking? Authorizing Provider  citalopram (CELEXA) 40 MG tablet Take 40 mg by mouth every morning.   Yes Historical Provider, MD  clonazePAM (KLONOPIN) 0.5 MG disintegrating tablet Take 0.5 mg by mouth 3 (three) times daily.    Yes Historical Provider, MD  lubiprostone (AMITIZA) 8 MCG capsule Take 1 capsule (8 mcg total) by mouth 2 (two) times daily with a meal. 02/06/13  Yes Nira Retort, NP  ondansetron (ZOFRAN ODT) 4 MG disintegrating tablet Take 1 tablet (4 mg total) by mouth every 8 (eight) hours as needed for nausea. 02/11/13  Yes Nicoletta Dress. Colon Branch, MD  pantoprazole (PROTONIX) 40 MG tablet Take 1 tablet (40 mg total) by mouth 2 (two) times daily. 02/01/13  Yes Nira Retort, NP  polyethylene glycol-electrolytes (TRILYTE) 420 G solution Take 4,000 mLs by mouth as directed. 02/08/13  Yes West Bali, MD  traZODone (DESYREL) 100 MG tablet Take 100 mg by mouth at bedtime.   Yes Historical Provider, MD  valACYclovir (VALTREX) 1000 MG tablet Take 1,000 mg by mouth at bedtime.    Yes Historical Provider, MD    Allergies as of 02/08/2013  . (No Known Allergies)    Family History  Problem Relation Age of Onset  . Diabetes Mother   . Diabetes Other   . Hypertension Other   . Colon cancer Neg Hx   . Colon polyps Neg Hx     History   Social History  . Marital Status: Single    Spouse Name: N/A    Number of  Children: 1  . Years of Education: N/A   Occupational History  .    [employer] Unemployed    Social History Main Topics  . Smoking status: Former Smoker -- 0.25 packs/day for .5 years    Types: Cigarettes    Quit date: 07/28/2011  . Smokeless tobacco: Never Used  . Alcohol Use: No  . Drug Use: No  . Sexually Active: Yes    Birth Control/ Protection: None   Other Topics Concern  . Not on file   Social History Narrative  . No narrative on file    Review of Systems: See HPI, otherwise negative ROS   Physical Exam: BP 131/66  Pulse 84  Temp(Src) 98 F (36.7 C) (Oral)  Resp 16  Ht 5\' 6"  (1.676 m)  Wt 338 lb (153.316 kg)  BMI 54.58 kg/m2  SpO2 95%  LMP 02/23/2013 General:   Alert,  pleasant and cooperative in NAD Head:  Normocephalic and atraumatic. Neck:  Supple; Lungs:  Clear throughout to auscultation.    Heart:  Regular rate and rhythm. Abdomen:  Soft, nontender and nondistended. Normal bowel sounds, without guarding, and without rebound.   Neurologic:  Alert and  oriented x4;  grossly normal neurologically.  Impression/Plan:     BRBPR/ABDOMINAL PAIN  PLAN: EGD/TCS TODAY

## 2013-02-27 ENCOUNTER — Encounter (HOSPITAL_COMMUNITY): Payer: Self-pay | Admitting: Gastroenterology

## 2013-02-28 ENCOUNTER — Telehealth: Payer: Self-pay | Admitting: Gastroenterology

## 2013-02-28 NOTE — Telephone Encounter (Signed)
CC PCP 

## 2013-02-28 NOTE — Telephone Encounter (Signed)
Unable to reach patient by phone. I had already mailed her an appt card for OV on 8/11 at 10 with AS in E30. Notes say today E15. I changed the E30 to E15 and now it has an extra E15 spot on AS schedule on 8/11

## 2013-02-28 NOTE — Telephone Encounter (Signed)
PLEASE CALL PT. HER ESOPHAGEAL BIOPSIES SHOW ACID DAMAGE TO HER ESOPHAGUS DUE TO REFLUX. SHE DOES NOT HAVE H PYLORI GASTRITIS. SHE SHOULD:  LOSE 20 LBS. CONTINUE PROTONIX. TAKE 30 MINUTES PRIOR TO MEALS TWICE DAILY. AVOID ITEMS THAT TRIGGER GASTRITIS. SEE INFO BELOW DRINK WATER TO KEEP YOUR URINE LIGHT YELLOW. FOLLOW A LOW FAT/HIGH FIBER DIET. AVOID ITEMS THAT CAUSE BLOATING.  FOLLOW UP IN 4 MOS E15 GERD.

## 2013-03-01 NOTE — Telephone Encounter (Signed)
Tried to call pt. Phone number not working.

## 2013-03-06 ENCOUNTER — Emergency Department (HOSPITAL_COMMUNITY)
Admission: EM | Admit: 2013-03-06 | Discharge: 2013-03-07 | Disposition: A | Discharge status: Home / Self Care | Payer: Medicaid Other | Attending: Emergency Medicine | Admitting: Emergency Medicine

## 2013-03-06 ENCOUNTER — Encounter (HOSPITAL_COMMUNITY): Payer: Self-pay | Admitting: *Deleted

## 2013-03-06 ENCOUNTER — Emergency Department (HOSPITAL_COMMUNITY): Payer: Medicaid Other

## 2013-03-06 DIAGNOSIS — S8002XA Contusion of left knee, initial encounter: Secondary | ICD-10-CM

## 2013-03-06 DIAGNOSIS — Z87891 Personal history of nicotine dependence: Secondary | ICD-10-CM | POA: Insufficient documentation

## 2013-03-06 DIAGNOSIS — Z79899 Other long term (current) drug therapy: Secondary | ICD-10-CM | POA: Insufficient documentation

## 2013-03-06 DIAGNOSIS — Y929 Unspecified place or not applicable: Secondary | ICD-10-CM | POA: Insufficient documentation

## 2013-03-06 DIAGNOSIS — R51 Headache: Secondary | ICD-10-CM

## 2013-03-06 DIAGNOSIS — S8000XA Contusion of unspecified knee, initial encounter: Secondary | ICD-10-CM | POA: Insufficient documentation

## 2013-03-06 DIAGNOSIS — K219 Gastro-esophageal reflux disease without esophagitis: Secondary | ICD-10-CM | POA: Insufficient documentation

## 2013-03-06 DIAGNOSIS — Z8739 Personal history of other diseases of the musculoskeletal system and connective tissue: Secondary | ICD-10-CM | POA: Insufficient documentation

## 2013-03-06 DIAGNOSIS — Y939 Activity, unspecified: Secondary | ICD-10-CM | POA: Insufficient documentation

## 2013-03-06 DIAGNOSIS — R42 Dizziness and giddiness: Secondary | ICD-10-CM | POA: Insufficient documentation

## 2013-03-06 DIAGNOSIS — W1789XA Other fall from one level to another, initial encounter: Secondary | ICD-10-CM | POA: Insufficient documentation

## 2013-03-06 DIAGNOSIS — Z8619 Personal history of other infectious and parasitic diseases: Secondary | ICD-10-CM | POA: Insufficient documentation

## 2013-03-06 DIAGNOSIS — Z8719 Personal history of other diseases of the digestive system: Secondary | ICD-10-CM | POA: Insufficient documentation

## 2013-03-06 LAB — CBC WITH DIFFERENTIAL/PLATELET
Basophils Absolute: 0 10*3/uL (ref 0.0–0.1)
Basophils Relative: 0 % (ref 0–1)
HCT: 40.4 % (ref 36.0–46.0)
Lymphocytes Relative: 25 % (ref 12–46)
MCHC: 35.1 g/dL (ref 30.0–36.0)
Monocytes Absolute: 0.6 10*3/uL (ref 0.1–1.0)
Neutro Abs: 6.7 10*3/uL (ref 1.7–7.7)
Neutrophils Relative %: 67 % (ref 43–77)
RDW: 13.1 % (ref 11.5–15.5)
WBC: 10 10*3/uL (ref 4.0–10.5)

## 2013-03-06 LAB — BASIC METABOLIC PANEL
CO2: 25 mEq/L (ref 19–32)
Chloride: 104 mEq/L (ref 96–112)
Creatinine, Ser: 0.9 mg/dL (ref 0.50–1.10)
GFR calc Af Amer: >90 mL/min (ref >90)
Potassium: 3.5 mEq/L (ref 3.5–5.1)
Sodium: 139 mEq/L (ref 135–145)

## 2013-03-06 MED ORDER — HYDROMORPHONE HCL PF 1 MG/ML IJ SOLN
1.0000 mg | Freq: Once | INTRAMUSCULAR | Status: AC
  Administered 2013-03-06: 1 mg via INTRAMUSCULAR
  Filled 2013-03-06: qty 1

## 2013-03-06 MED ORDER — KETOROLAC TROMETHAMINE 30 MG/ML IJ SOLN
60.0000 mg | Freq: Once | INTRAMUSCULAR | Status: AC
  Administered 2013-03-06: 60 mg via INTRAMUSCULAR
  Filled 2013-03-06 (×2): qty 1

## 2013-03-06 MED ORDER — OXYCODONE-ACETAMINOPHEN 5-325 MG PO TABS
1.0000 | ORAL_TABLET | Freq: Once | ORAL | Status: AC
  Administered 2013-03-06: 1 via ORAL
  Filled 2013-03-06: qty 1

## 2013-03-06 NOTE — Telephone Encounter (Signed)
Pt aware of results. I also updated her phone number.  [name] can you please make her an appointment for 4 months with [facility]

## 2013-03-06 NOTE — ED Provider Notes (Signed)
History  This chart was scribed for [name], MD by Greggory Stallion, ED Scribe. This patient was seen in room APA05/APA05 and the patient's care was started at 7:21 PM.   CSN: [id number]  Arrival date & time 03/06/13  1633     Chief Complaint  Patient presents with  . Fall  . headaches      Patient is a 27 y.o. female presenting with fall. The history is provided by the patient. No language interpreter was used.  Fall The accident occurred more than 2 days ago. Fall occurred: once in the shower and once while walking up the steps. She fell from a height of 3 to 5 ft. There was no blood loss. The point of impact was the left knee. The pain is present in the left knee. The pain is moderate. She was ambulatory at the scene. There was no entrapment after the fall. There was no drug use involved in the accident. There was no alcohol use involved in the accident. Associated symptoms include headaches. Pertinent negatives include no abdominal pain and no hematuria. The symptoms are aggravated by ambulation. She has tried nothing for the symptoms.    [name] is a 27 y.o. female who presents to the Emergency Department complaining of falling that started 3 days ago and severe, constant HA that started 2 days ago. Pt states she has never had problems falling but she has had problems with HA before. Pt states she has fallen twice today, once while in the shower and the other while walking up the steps. She states she becomes weak and dizzy when the HA occur. Pt states she has a bruise on her left knee and is dehydrated. Pt denies fever, neck pain, sore throat, visual disturbance, CP, cough, SOB, abdominal pain, nausea, emesis, diarrhea, urinary symptoms, back pain, numbness and rash as associated symptoms. Pt states she has been taking Valtrex once a day for the past year.    Past Medical History  Diagnosis Date  . Herpes   . GERD (gastroesophageal reflux disease)   . Gastritis   .  Disk prolapse   . Bulging disc     Past Surgical History  Procedure Laterality Date  . Wisdom tooth extraction    . Esophagogastroduodenoscopy  01/19/2012    NWG:NFAOZH,YQMVHQIO IN THE ANTRUM/HIATAL HERNIA/  . Colonoscopy with esophagogastroduodenoscopy (egd) N/A 02/24/2013    Procedure: COLONOSCOPY WITH ESOPHAGOGASTRODUODENOSCOPY (EGD);  Surgeon: West Bali, MD;  Location: [facility];  Service: Endoscopy;  Laterality: N/A;  8;30    Family History  Problem Relation Age of Onset  . Diabetes Mother   . Diabetes Other   . Hypertension Other   . Colon cancer Neg Hx   . Colon polyps Neg Hx     History  Substance Use Topics  . Smoking status: Former Smoker -- 0.25 packs/day for .5 years    Types: Cigarettes    Quit date: 07/28/2011  . Smokeless tobacco: Never Used  . Alcohol Use: No    OB History   Grav Para Term Preterm Abortions TAB SAB Ect Mult Living   1 1 1       1       Review of Systems  Constitutional: Negative for appetite change and fatigue.  HENT: Negative for congestion, sinus pressure and ear discharge.   Eyes: Negative for discharge.  Respiratory: Negative for cough.   Cardiovascular: Negative for chest pain.  Gastrointestinal: Negative for abdominal pain and diarrhea.  Genitourinary: Negative for frequency and hematuria.  Musculoskeletal: Negative for back pain.  Skin: Negative for rash.  Neurological: Positive for dizziness, weakness and headaches. Negative for seizures.  Psychiatric/Behavioral: Negative for hallucinations.    Allergies  Review of patient's allergies indicates no known allergies.  Home Medications   Current Outpatient Rx  Name  Route  Sig  Dispense  Refill  . citalopram (CELEXA) 20 MG tablet   Oral   Take 20 mg by mouth daily.         . DULoxetine (CYMBALTA) 60 MG capsule   Oral   Take 60 mg by mouth at bedtime.         . gabapentin (NEURONTIN) 300 MG capsule   Oral   Take 300 mg by mouth 3 (three) times daily.          . hydrOXYzine (VISTARIL) 50 MG capsule   Oral   Take 50 mg by mouth 3 (three) times daily as needed for itching.         . lubiprostone (AMITIZA) 8 MCG capsule   Oral   Take 1 capsule (8 mcg total) by mouth 2 (two) times daily with a meal.   60 capsule   1   . omeprazole (PRILOSEC) 20 MG capsule   Oral   Take 20 mg by mouth daily.         Marland Kitchen tiZANidine (ZANAFLEX) 2 MG tablet   Oral   Take 2 mg by mouth every 8 (eight) hours.         . topiramate (TOPAMAX) 100 MG tablet   Oral   Take 100 mg by mouth daily.         . traMADol (ULTRAM) 50 MG tablet   Oral   Take 50-100 mg by mouth every 6 (six) hours as needed for pain.         . traZODone (DESYREL) 100 MG tablet   Oral   Take 200 mg by mouth at bedtime.          . valACYclovir (VALTREX) 1000 MG tablet   Oral   Take 1,000 mg by mouth at bedtime.          . ondansetron (ZOFRAN ODT) 4 MG disintegrating tablet   Oral   Take 1 tablet (4 mg total) by mouth every 8 (eight) hours as needed for nausea.   20 tablet   0     Triage Vitals: BP 112/44  Pulse 88  Temp(Src) 98.2 F (36.8 C) (Oral)  Resp 20  Ht 5\' 6"  (1.676 m)  Wt 337 lb (152.862 kg)  BMI 54.42 kg/m2  SpO2 100%  LMP 02/23/2013  Physical Exam  Nursing note and vitals reviewed. Constitutional: She is oriented to person, place, and time. She appears well-developed.  HENT:  Head: Normocephalic.  Dry mucous membranes.  Eyes: Conjunctivae and EOM are normal. No scleral icterus.  Neck: Neck supple. No thyromegaly present.  Cardiovascular: Normal rate and regular rhythm.  Exam reveals no gallop and no friction rub.   No murmur heard. Pulmonary/Chest: No stridor. She has no wheezes. She has no rales. She exhibits no tenderness.  Abdominal: She exhibits no distension. There is no tenderness. There is no rebound.  Musculoskeletal: Normal range of motion. She exhibits no edema.  Tenderness of left knee.  Lymphadenopathy:    She has no  cervical adenopathy.  Neurological: She is oriented to person, place, and time. Coordination normal.  Skin: No rash noted. No erythema.  Psychiatric: She  has a normal mood and affect. Her behavior is normal.    ED Course  Procedures (including critical care time)  DIAGNOSTIC STUDIES: Oxygen Saturation is 100% on RA, normal by my interpretation.    COORDINATION OF CARE: 7:27 PM-Discussed treatment plan which includes CBC, BMP, and EKG with pt at bedside and pt agreed to plan.   9:58 PM- Discussed results of blood work which include no abnormalities. Advised pt to call Dr. Renard Matter tomorrow to make and appointment. Pt complains head still hurts. Will give pt 1 mg injection of dilaudid.   Labs Reviewed  CBC WITH DIFFERENTIAL  BASIC METABOLIC PANEL   No results found.   No diagnosis found.    MDM    The chart was scribed for me under my direct supervision.  I personally performed the history, physical, and medical decision making and all procedures in the evaluation of this patient.[name], MD 03/06/13 [phone]

## 2013-03-06 NOTE — ED Notes (Signed)
Increased falls x 3 days, headaches x 2 days ago.  C/o dizziness and lightheadedness with falls.  Denies LOC with falls.

## 2013-03-13 ENCOUNTER — Encounter: Payer: Self-pay | Admitting: General Practice

## 2013-03-13 NOTE — Telephone Encounter (Signed)
Pt scheduled for 06/26/13@:00am.  Letter mailed

## 2013-03-15 ENCOUNTER — Ambulatory Visit: Payer: Medicaid Other | Admitting: Gastroenterology

## 2013-05-06 NOTE — Progress Notes (Signed)
REVIEWED.  EGD/DIL APR 2014 REFLUX ESOPHAGITIS, NL GASTRIC MUCOSA

## 2013-05-14 ENCOUNTER — Other Ambulatory Visit: Payer: Self-pay

## 2013-05-14 ENCOUNTER — Encounter (HOSPITAL_COMMUNITY): Payer: Self-pay | Admitting: Emergency Medicine

## 2013-05-14 ENCOUNTER — Emergency Department (HOSPITAL_COMMUNITY)
Admission: EM | Admit: 2013-05-14 | Discharge: 2013-05-14 | Disposition: A | Discharge status: Home / Self Care | Payer: Medicaid Other | Attending: Emergency Medicine | Admitting: Emergency Medicine

## 2013-05-14 DIAGNOSIS — M79609 Pain in unspecified limb: Secondary | ICD-10-CM | POA: Insufficient documentation

## 2013-05-14 DIAGNOSIS — R209 Unspecified disturbances of skin sensation: Secondary | ICD-10-CM | POA: Insufficient documentation

## 2013-05-14 DIAGNOSIS — Z87891 Personal history of nicotine dependence: Secondary | ICD-10-CM | POA: Insufficient documentation

## 2013-05-14 DIAGNOSIS — R079 Chest pain, unspecified: Secondary | ICD-10-CM

## 2013-05-14 DIAGNOSIS — K219 Gastro-esophageal reflux disease without esophagitis: Secondary | ICD-10-CM | POA: Insufficient documentation

## 2013-05-14 DIAGNOSIS — Z8619 Personal history of other infectious and parasitic diseases: Secondary | ICD-10-CM | POA: Insufficient documentation

## 2013-05-14 DIAGNOSIS — H53149 Visual discomfort, unspecified: Secondary | ICD-10-CM | POA: Insufficient documentation

## 2013-05-14 DIAGNOSIS — R259 Unspecified abnormal involuntary movements: Secondary | ICD-10-CM | POA: Insufficient documentation

## 2013-05-14 DIAGNOSIS — R51 Headache: Secondary | ICD-10-CM | POA: Insufficient documentation

## 2013-05-14 DIAGNOSIS — Z79899 Other long term (current) drug therapy: Secondary | ICD-10-CM | POA: Insufficient documentation

## 2013-05-14 DIAGNOSIS — Z8739 Personal history of other diseases of the musculoskeletal system and connective tissue: Secondary | ICD-10-CM | POA: Insufficient documentation

## 2013-05-14 DIAGNOSIS — Z8719 Personal history of other diseases of the digestive system: Secondary | ICD-10-CM | POA: Insufficient documentation

## 2013-05-14 MED ORDER — OXYCODONE-ACETAMINOPHEN 5-325 MG PO TABS
1.0000 | ORAL_TABLET | Freq: Once | ORAL | Status: AC
  Administered 2013-05-14: 1 via ORAL
  Filled 2013-05-14: qty 1

## 2013-05-14 MED ORDER — ONDANSETRON 8 MG PO TBDP
8.0000 mg | ORAL_TABLET | Freq: Once | ORAL | Status: AC
  Administered 2013-05-14: 8 mg via ORAL
  Filled 2013-05-14: qty 1

## 2013-05-14 MED ORDER — KETOROLAC TROMETHAMINE 30 MG/ML IJ SOLN
30.0000 mg | Freq: Once | INTRAMUSCULAR | Status: AC
  Administered 2013-05-14: 30 mg via INTRAMUSCULAR
  Filled 2013-05-14: qty 1

## 2013-05-14 NOTE — ED Notes (Signed)
Patient complaining of headache that started this morning around approximately 0900. Also reports right arm pain and jerking since 0900 today. States numbness and tingling to right leg as well.

## 2013-05-14 NOTE — ED Provider Notes (Signed)
History  This chart was scribed for Joya Gaskins, MD by Manuela Schwartz, ED scribe. This patient was seen in room APA07/[facility]07 and the patient's care was started at 2054.  CSN: [id number] Arrival date & time 05/14/13  4098  First MD Initiated Contact with Patient 05/14/13 2054     Chief Complaint  Patient presents with  . Headache  . Arm Pain  . Numbness   Patient is a 27 y.o. female presenting with headaches and arm pain. The history is provided by the patient. No language interpreter was used.  Headache Pain location:  Generalized Radiates to:  Does not radiate Onset quality:  Gradual Duration:  12 hours Timing:  Constant Progression:  Unchanged Chronicity:  New Similar to prior headaches: yes   Context: bright light   Relieved by:  Nothing Worsened by:  Nothing tried Ineffective treatments:  None tried Associated symptoms: numbness (pain and numbness of entire right leg)   Associated symptoms: no abdominal pain, no blurred vision, no congestion, no cough, no fever, no focal weakness, no hearing loss, no nausea and no vomiting   Arm Pain Associated symptoms include chest pain and headaches. Pertinent negatives include no abdominal pain and no shortness of breath.   HPI Comments: [name] is a 27 y.o. female who presents to the Emergency Department w/hx of headaches complaining of severe, constant, gradually worsening HA onset this AM. She states woke this AM and over 30 minutes her HA worsened to a severe HA. She states associated photophobia. No falls or injury. She states yesterday felt perfectly fine, no HA, injury/illness. She states that her right hand is also twitching continuously and her right leg is numb w/pain which radiates down her entire right leg. Her leg pain began today also while she was walking. She has a hx of bulging disc.   She also c/o Chest pain onset here in ED and that she has had this episode before and the CP appears worse with palpation of her  chest She denies fever, emesis, visual disturbances, abdominal pain.    Past Medical History  Diagnosis Date  . Herpes   . GERD (gastroesophageal reflux disease)   . Gastritis   . Disk prolapse   . Bulging disc    Past Surgical History  Procedure Laterality Date  . Wisdom tooth extraction    . Esophagogastroduodenoscopy  01/19/2012    JXB:JYNWGN,FAOZHYQM IN THE ANTRUM/HIATAL HERNIA/  . Colonoscopy with esophagogastroduodenoscopy (egd) N/A 02/24/2013    Procedure: COLONOSCOPY WITH ESOPHAGOGASTRODUODENOSCOPY (EGD);  Surgeon: West Bali, MD;  Location: [facility];  Service: Endoscopy;  Laterality: N/A;  8;30   Family History  Problem Relation Age of Onset  . Diabetes Mother   . Diabetes Other   . Hypertension Other   . Colon cancer Neg Hx   . Colon polyps Neg Hx    History  Substance Use Topics  . Smoking status: Former Smoker -- 0.25 packs/day for .5 years    Types: Cigarettes    Quit date: 07/28/2011  . Smokeless tobacco: Never Used  . Alcohol Use: No   OB History   Grav Para Term Preterm Abortions TAB SAB Ect Mult Living   1 1 1       1      Review of Systems  Constitutional: Negative for fever and chills.  HENT: Negative for hearing loss, congestion and rhinorrhea.   Eyes: Negative for blurred vision.  Respiratory: Negative for cough and shortness of breath.  Cardiovascular: Positive for chest pain.  Gastrointestinal: Negative for nausea, vomiting and abdominal pain.  Genitourinary: Negative for dysuria.  Neurological: Positive for numbness (pain and numbness of entire right leg) and headaches. Negative for focal weakness, syncope and weakness.       Twitching of her right hand  All other systems reviewed and are negative.   A complete 10 system review of systems was obtained and all systems are negative except as noted in the HPI and PMH.   Allergies  Review of patient's allergies indicates no known allergies.  Home Medications   Current Outpatient  Rx  Name  Route  Sig  Dispense  Refill  . citalopram (CELEXA) 20 MG tablet   Oral   Take 20 mg by mouth daily.         . DULoxetine (CYMBALTA) 60 MG capsule   Oral   Take 60 mg by mouth at bedtime.         . gabapentin (NEURONTIN) 300 MG capsule   Oral   Take 300 mg by mouth 3 (three) times daily.         . hydrOXYzine (VISTARIL) 50 MG capsule   Oral   Take 50 mg by mouth 3 (three) times daily as needed for itching.         . lubiprostone (AMITIZA) 8 MCG capsule   Oral   Take 1 capsule (8 mcg total) by mouth 2 (two) times daily with a meal.   60 capsule   1   . omeprazole (PRILOSEC) 20 MG capsule   Oral   Take 20 mg by mouth daily.         . ondansetron (ZOFRAN ODT) 4 MG disintegrating tablet   Oral   Take 1 tablet (4 mg total) by mouth every 8 (eight) hours as needed for nausea.   20 tablet   0   . tiZANidine (ZANAFLEX) 2 MG tablet   Oral   Take 2 mg by mouth every 8 (eight) hours.         . topiramate (TOPAMAX) 100 MG tablet   Oral   Take 100 mg by mouth daily.         . traMADol (ULTRAM) 50 MG tablet   Oral   Take 50-100 mg by mouth every 6 (six) hours as needed for pain.         . traZODone (DESYREL) 100 MG tablet   Oral   Take 200 mg by mouth at bedtime.          . valACYclovir (VALTREX) 1000 MG tablet   Oral   Take 1,000 mg by mouth at bedtime.           Triage Vitals: BP 111/71  Pulse 95  Temp(Src) 97.3 F (36.3 C) (Oral)  Resp 24  Ht 5\' 6"  (1.676 m)  Wt 343 lb (155.584 kg)  BMI 55.39 kg/m2  SpO2 100%  LMP 05/08/2013 Physical Exam CONSTITUTIONAL: Well developed/well nourished, pt using telephone when I enter and watching TV HEAD: Normocephalic/atraumatic EYES: EOMI/PERRL, no nystagmus ENMT: Mucous membranes moist NECK: supple no meningeal signs, no bruits SPINE:entire spine nontender Chest - tender to palpation, no crepitance or brusing CV: S1/S2 noted, no murmurs/rubs/gallops noted LUNGS: Lungs are clear to  auscultation bilaterally, no apparent distress ABDOMEN: soft, nontender, no rebound or guarding GU:no cva tenderness NEURO:Awake/alert, facies symmetric, no arm or leg drift is noted Cranial nerves 3/4/5/6/05/24/09/11/12 tested and intact Gait normal without ataxia No past pointing No focal sensory  deficits noted to her extremities No tremor noted.  She will flap her right hand up and down when I am speaking to her, but has full movement of the right hand. EXTREMITIES: pulses normal, full ROM, no deformity, no focal tenderness noted SKIN: warm, color normal PSYCH: no abnormalities of mood noted    ED Course  Procedures   DIAGNOSTIC STUDIES: Oxygen Saturation is 100% on room air, normal by my interpretation.    COORDINATION OF CARE: At 935 PM Discussed treatment plan with patient which includes toradol, zofran, EKG. Patient agrees.   Pt her with multiple complaints For her HA - gradual in onset, no neuro deficits on my exam, history not suggestive of SAH/meningitis or other acute neurologic process.  For her CP - this started just while being in the ED.  She has no findings to suggest ACS/PE/dissection.  She is in no distress, nontoxic , watching TV.  Stable for d/c I advised her to f/u with her PCP within 48 hours  MDM  Nursing notes including past medical history and social history reviewed and considered in documentation Previous records reviewed and considered - pt has previous neuroimaging      Date: 05/14/2013  Rate: 87  Rhythm: normal sinus rhythm  QRS Axis: normal  Intervals: normal  ST/T Wave abnormalities: normal  Conduction Disutrbances:none     I personally performed the services described in this documentation, which was scribed in my presence. The recorded information has been reviewed and is accurate.       Joya Gaskins, MD 05/15/13 [phone]

## 2013-05-15 ENCOUNTER — Emergency Department (HOSPITAL_COMMUNITY): Payer: Medicaid Other

## 2013-05-15 ENCOUNTER — Encounter (HOSPITAL_COMMUNITY): Payer: Self-pay | Admitting: *Deleted

## 2013-05-15 ENCOUNTER — Emergency Department (HOSPITAL_COMMUNITY)
Admission: EM | Admit: 2013-05-15 | Discharge: 2013-05-15 | Disposition: A | Discharge status: Home / Self Care | Payer: Medicaid Other | Attending: Emergency Medicine | Admitting: Emergency Medicine

## 2013-05-15 DIAGNOSIS — Z8739 Personal history of other diseases of the musculoskeletal system and connective tissue: Secondary | ICD-10-CM | POA: Insufficient documentation

## 2013-05-15 DIAGNOSIS — Z79899 Other long term (current) drug therapy: Secondary | ICD-10-CM | POA: Insufficient documentation

## 2013-05-15 DIAGNOSIS — Z8719 Personal history of other diseases of the digestive system: Secondary | ICD-10-CM | POA: Insufficient documentation

## 2013-05-15 DIAGNOSIS — R51 Headache: Secondary | ICD-10-CM | POA: Insufficient documentation

## 2013-05-15 DIAGNOSIS — Z87891 Personal history of nicotine dependence: Secondary | ICD-10-CM | POA: Insufficient documentation

## 2013-05-15 DIAGNOSIS — K219 Gastro-esophageal reflux disease without esophagitis: Secondary | ICD-10-CM | POA: Insufficient documentation

## 2013-05-15 DIAGNOSIS — Z8619 Personal history of other infectious and parasitic diseases: Secondary | ICD-10-CM | POA: Insufficient documentation

## 2013-05-15 DIAGNOSIS — R209 Unspecified disturbances of skin sensation: Secondary | ICD-10-CM | POA: Insufficient documentation

## 2013-05-15 LAB — CBC WITH DIFFERENTIAL/PLATELET
Basophils Absolute: 0 10*3/uL (ref 0.0–0.1)
Basophils Relative: 0 % (ref 0–1)
Eosinophils Absolute: 0.1 10*3/uL (ref 0.0–0.7)
Eosinophils Relative: 1 % (ref 0–5)
HCT: 42.1 % (ref 36.0–46.0)
Lymphocytes Relative: 27 % (ref 12–46)
MCH: 31.9 pg (ref 26.0–34.0)
MCHC: 34.7 g/dL (ref 30.0–36.0)
MCV: 91.9 fL (ref 78.0–100.0)
Monocytes Absolute: 0.7 10*3/uL (ref 0.1–1.0)
RDW: 13.1 % (ref 11.5–15.5)

## 2013-05-15 LAB — COMPREHENSIVE METABOLIC PANEL
AST: 52 U/L — ABNORMAL HIGH (ref 0–37)
CO2: 27 mEq/L (ref 19–32)
Calcium: 9.5 mg/dL (ref 8.4–10.5)
Creatinine, Ser: 1.1 mg/dL (ref 0.50–1.10)
GFR calc non Af Amer: 70 mL/min — ABNORMAL LOW (ref >90)

## 2013-05-15 MED ORDER — OXYCODONE-ACETAMINOPHEN 5-325 MG PO TABS: 1.0000 | ORAL_TABLET | Freq: Four times a day (QID) | ORAL | Status: DC | PRN

## 2013-05-15 MED ORDER — LORAZEPAM 1 MG PO TABS: ORAL_TABLET | ORAL | Status: DC

## 2013-05-15 MED ORDER — KETOROLAC TROMETHAMINE 30 MG/ML IJ SOLN
30.0000 mg | Freq: Once | INTRAMUSCULAR | Status: AC
  Administered 2013-05-15: 30 mg via INTRAVENOUS
  Filled 2013-05-15: qty 1

## 2013-05-15 MED ORDER — OXYCODONE-ACETAMINOPHEN 5-325 MG PO TABS
1.0000 | ORAL_TABLET | Freq: Once | ORAL | Status: AC
  Administered 2013-05-15: 1 via ORAL
  Filled 2013-05-15: qty 1

## 2013-05-15 MED ORDER — LORAZEPAM 2 MG/ML IJ SOLN
2.0000 mg | Freq: Once | INTRAMUSCULAR | Status: AC
  Administered 2013-05-15: 2 mg via INTRAVENOUS
  Filled 2013-05-15: qty 1

## 2013-05-15 NOTE — ED Notes (Signed)
Pt presents with c/o headache x 3 days, and rt hand tremors. Pt reports was seen here yesterday and was [facility] with worsening symptoms since being seen. Pt was noted laughing and carrying on with boyfriend upon entering the room. Tremors in said right hand and arm cease when holding arm and pt was talking to friend. NAD noted. Denies photosensitivity, vision changes and n/v at this time.

## 2013-05-15 NOTE — ED Provider Notes (Signed)
History     This chart was scribed for [name], MD, MD by Smitty Pluck, ED Scribe. The patient was seen in room APA10/APA10 and the patient's care was started at 5:07 PM.  CSN: [id number] Arrival date & time 05/15/13  1528    Chief Complaint  Patient presents with  . Headache    Patient is a 27 y.o. female presenting with headaches. The history is provided by the patient and medical records. No language interpreter was used.  Headache Pain location:  Frontal Quality:  Unable to specify Radiates to:  Does not radiate Severity currently:  Unable to specify Severity at highest:  Unable to specify Onset quality:  Sudden Duration:  1 day Timing:  Constant Chronicity:  Recurrent Similar to prior headaches: yes   Associated symptoms: numbness   Associated symptoms: no abdominal pain, no back pain, no congestion, no cough, no diarrhea, no fatigue, no seizures and no sinus pressure    HPI Comments: [name] is a 27 y.o. female who presents to the Emergency Department complaining of constant, moderate, frontal HA onset 1 day ago. Pt was seen in ED 1 day ago for HA and had nl EKG. Pt has hx of HA and this one feels similar. Pt reports having tingling sensation in right upper extremity and lower right extremity. She states she has twitching in her right hand. Movement aggravates the HA. Pt denies head injury, fever, chills, nausea, vomiting, diarrhea, weakness, cough, SOB and any other pain.   Past Medical History  Diagnosis Date  . Herpes   . GERD (gastroesophageal reflux disease)   . Gastritis   . Disk prolapse   . Bulging disc    Past Surgical History  Procedure Laterality Date  . Wisdom tooth extraction    . Esophagogastroduodenoscopy  01/19/2012    NWG:NFAOZH,YQMVHQIO IN THE ANTRUM/HIATAL HERNIA/  . Colonoscopy with esophagogastroduodenoscopy (egd) N/A 02/24/2013    Procedure: COLONOSCOPY WITH ESOPHAGOGASTRODUODENOSCOPY (EGD);  Surgeon: West Bali, MD;   Location: [facility];  Service: Endoscopy;  Laterality: N/A;  8;30   Family History  Problem Relation Age of Onset  . Diabetes Mother   . Diabetes Other   . Hypertension Other   . Colon cancer Neg Hx   . Colon polyps Neg Hx    History  Substance Use Topics  . Smoking status: Former Smoker -- 0.25 packs/day for .5 years    Types: Cigarettes    Quit date: 07/28/2011  . Smokeless tobacco: Never Used  . Alcohol Use: No   OB History   Grav Para Term Preterm Abortions TAB SAB Ect Mult Living   1 1 1       1      Review of Systems  Constitutional: Negative for appetite change and fatigue.  HENT: Negative for congestion, sinus pressure and ear discharge.   Eyes: Negative for discharge.  Respiratory: Negative for cough.   Cardiovascular: Negative for chest pain.  Gastrointestinal: Negative for abdominal pain and diarrhea.  Genitourinary: Negative for frequency and hematuria.  Musculoskeletal: Negative for back pain.  Skin: Negative for rash.  Neurological: Positive for numbness and headaches. Negative for seizures.  Psychiatric/Behavioral: Negative for hallucinations.    Allergies  Review of patient's allergies indicates no known allergies.  Home Medications   Current Outpatient Rx  Name  Route  Sig  Dispense  Refill  . citalopram (CELEXA) 20 MG tablet   Oral   Take 20 mg by mouth daily.         Marland Kitchen  DULoxetine (CYMBALTA) 60 MG capsule   Oral   Take 60 mg by mouth at bedtime.         . gabapentin (NEURONTIN) 300 MG capsule   Oral   Take 300 mg by mouth 3 (three) times daily.         . hydrOXYzine (VISTARIL) 50 MG capsule   Oral   Take 50 mg by mouth 3 (three) times daily as needed for itching.         . lubiprostone (AMITIZA) 8 MCG capsule   Oral   Take 1 capsule (8 mcg total) by mouth 2 (two) times daily with a meal.   60 capsule   1   . tiZANidine (ZANAFLEX) 2 MG tablet   Oral   Take 2 mg by mouth every 8 (eight) hours.         . topiramate  (TOPAMAX) 100 MG tablet   Oral   Take 100 mg by mouth daily.         . traMADol (ULTRAM) 50 MG tablet   Oral   Take 50-100 mg by mouth every 6 (six) hours as needed for pain.         . traZODone (DESYREL) 100 MG tablet   Oral   Take 200 mg by mouth at bedtime.          . valACYclovir (VALTREX) 1000 MG tablet   Oral   Take 1,000 mg by mouth at bedtime.           BP 99/60  Pulse 92  Temp(Src) 97.1 F (36.2 C) (Oral)  Resp 18  Ht 5\' 6"  (1.676 m)  Wt 334 lb (151.501 kg)  BMI 53.93 kg/m2  SpO2 96%  LMP 05/08/2013  Physical Exam  Nursing note and vitals reviewed. Constitutional: She is oriented to person, place, and time. She appears well-developed.  HENT:  Head: Normocephalic.  Eyes: Conjunctivae and EOM are normal. No scleral icterus.  Neck: Neck supple. No thyromegaly present.  Cardiovascular: Normal rate and regular rhythm.  Exam reveals no gallop and no friction rub.   No murmur heard. Pulmonary/Chest: No stridor. She has no wheezes. She has no rales. She exhibits no tenderness.  Abdominal: She exhibits no distension. There is no tenderness. There is no rebound.  Musculoskeletal: Normal range of motion. She exhibits no edema.  Lymphadenopathy:    She has no cervical adenopathy.  Neurological: She is alert and oriented to person, place, and time. No cranial nerve deficit.  Tremor in right arm that stops when pt is distracted   Skin: No rash noted. No erythema.  Psychiatric: She has a normal mood and affect. Her behavior is normal.    ED Course  Procedures (including critical care time) DIAGNOSTIC STUDIES: Oxygen Saturation is 96% on room air, adequate by my interpretation.    COORDINATION OF CARE: 5:13 PM Discussed ED treatment with pt and pt agrees.  7:29 PM Recheck: Discussed lab results and treatment course with pt. Pt is feeling better. Pt is ready for discharge.     Labs Reviewed  COMPREHENSIVE METABOLIC PANEL - Abnormal; Notable for the  following:    AST 52 (*)    Alkaline Phosphatase 130 (*)    Total Bilirubin 0.2 (*)    GFR calc non Af Amer 70 (*)    GFR calc Af Amer 81 (*)    All other components within normal limits  CBC WITH DIFFERENTIAL   Mr Brain Wo Contrast  05/15/2013   *RADIOLOGY  REPORT*  Clinical Data: Severe headache.  Morbid obesity.  MRI HEAD WITHOUT CONTRAST  Technique:  Multiplanar, multiecho pulse sequences of the brain and surrounding structures were obtained according to standard protocol without intravenous contrast.  Comparison: CT head 03/06/2013.  Findings: The patient had difficulty remaining motionless for the study.  Images are suboptimal.  Small or subtle lesions could be overlooked.  There is no acute stroke, intracranial hemorrhage, mass lesion, hydrocephalus, or extra-axial fluid.  There is no atrophy or white matter disease within limits of detection on this motion degraded exam.  There is subtle evidence of diffuse increased intracranial pressure, with slight effacement of the basilar cisterns, central squeezing of the ventricles, including the lateral, third, and fourth ventricles, as well as mild tonsillar descent through the foramen magnum.  The pituitary and pineal appear otherwise normal.  The  calvarium is intact.  Orbits are negative.  There is no significant sinus or mastoid disease.  Compared with prior CT, similar appearance was noted at that time.  IMPRESSION: Constellation of findings most consistent with pseudotumor cerebri or benign increased intracranial pressure.     Correlate clinically.  Neurologic consultation may be warranted.   Original Report Authenticated By: Davonna Belling, M.D.   No diagnosis found.  MDM    The chart was scribed for me under my direct supervision.  I personally performed the history, physical, and medical decision making and all procedures in the evaluation of this patient.[name], MD 05/15/13 [id number]

## 2013-05-15 NOTE — ED Notes (Signed)
Headache, twitching of rt arm, Seen here yesterday for same. Tingling /numbness of rt leg.  Alert,No HI.

## 2013-05-22 ENCOUNTER — Other Ambulatory Visit: Payer: Self-pay | Admitting: Adult Health

## 2013-06-12 ENCOUNTER — Other Ambulatory Visit: Payer: Self-pay | Admitting: Gastroenterology

## 2013-06-26 ENCOUNTER — Ambulatory Visit: Payer: Medicaid Other | Admitting: Gastroenterology

## 2013-06-27 ENCOUNTER — Emergency Department (HOSPITAL_COMMUNITY)
Admission: EM | Admit: 2013-06-27 | Discharge: 2013-06-27 | Disposition: A | Discharge status: Home / Self Care | Payer: Medicaid Other | Attending: Emergency Medicine | Admitting: Emergency Medicine

## 2013-06-27 ENCOUNTER — Encounter (HOSPITAL_COMMUNITY): Payer: Self-pay | Admitting: *Deleted

## 2013-06-27 DIAGNOSIS — R197 Diarrhea, unspecified: Secondary | ICD-10-CM

## 2013-06-27 DIAGNOSIS — Z8739 Personal history of other diseases of the musculoskeletal system and connective tissue: Secondary | ICD-10-CM | POA: Insufficient documentation

## 2013-06-27 DIAGNOSIS — Z8619 Personal history of other infectious and parasitic diseases: Secondary | ICD-10-CM | POA: Insufficient documentation

## 2013-06-27 DIAGNOSIS — K649 Unspecified hemorrhoids: Secondary | ICD-10-CM | POA: Insufficient documentation

## 2013-06-27 DIAGNOSIS — E876 Hypokalemia: Secondary | ICD-10-CM

## 2013-06-27 DIAGNOSIS — Z87891 Personal history of nicotine dependence: Secondary | ICD-10-CM | POA: Insufficient documentation

## 2013-06-27 DIAGNOSIS — Z79899 Other long term (current) drug therapy: Secondary | ICD-10-CM | POA: Insufficient documentation

## 2013-06-27 DIAGNOSIS — R109 Unspecified abdominal pain: Secondary | ICD-10-CM | POA: Insufficient documentation

## 2013-06-27 DIAGNOSIS — R110 Nausea: Secondary | ICD-10-CM | POA: Insufficient documentation

## 2013-06-27 DIAGNOSIS — Z8719 Personal history of other diseases of the digestive system: Secondary | ICD-10-CM | POA: Insufficient documentation

## 2013-06-27 DIAGNOSIS — R42 Dizziness and giddiness: Secondary | ICD-10-CM | POA: Insufficient documentation

## 2013-06-27 LAB — URINALYSIS, ROUTINE W REFLEX MICROSCOPIC
Nitrite: NEGATIVE
Specific Gravity, Urine: >1.03 — ABNORMAL HIGH (ref 1.005–1.030)
Urobilinogen, UA: 1 mg/dL (ref 0.0–1.0)

## 2013-06-27 LAB — COMPREHENSIVE METABOLIC PANEL
Albumin: 3.6 g/dL (ref 3.5–5.2)
BUN: 5 mg/dL — ABNORMAL LOW (ref 6–23)
Creatinine, Ser: 0.99 mg/dL (ref 0.50–1.10)
GFR calc Af Amer: >90 mL/min (ref >90)
Total Bilirubin: 0.4 mg/dL (ref 0.3–1.2)
Total Protein: 7.2 g/dL (ref 6.0–8.3)

## 2013-06-27 LAB — RAPID URINE DRUG SCREEN, HOSP PERFORMED
Cocaine: NOT DETECTED
Opiates: NOT DETECTED

## 2013-06-27 LAB — CBC WITH DIFFERENTIAL/PLATELET
Basophils Relative: 0 % (ref 0–1)
Eosinophils Absolute: 0.1 10*3/uL (ref 0.0–0.7)
HCT: 39.5 % (ref 36.0–46.0)
Hemoglobin: 14 g/dL (ref 12.0–15.0)
MCH: 31.5 pg (ref 26.0–34.0)
MCHC: 35.4 g/dL (ref 30.0–36.0)
MCV: 89 fL (ref 78.0–100.0)
Monocytes Absolute: 0.7 10*3/uL (ref 0.1–1.0)
Monocytes Relative: 9 % (ref 3–12)

## 2013-06-27 LAB — LIPASE, BLOOD: Lipase: 42 U/L (ref 11–59)

## 2013-06-27 MED ORDER — LOPERAMIDE HCL 2 MG PO CAPS
4.0000 mg | ORAL_CAPSULE | Freq: Once | ORAL | Status: AC
  Administered 2013-06-27: 4 mg via ORAL
  Filled 2013-06-27: qty 2

## 2013-06-27 MED ORDER — PROMETHAZINE HCL 25 MG PO TABS: 25.0000 mg | ORAL_TABLET | Freq: Four times a day (QID) | ORAL | Status: DC | PRN

## 2013-06-27 MED ORDER — FENTANYL CITRATE 0.05 MG/ML IJ SOLN
50.0000 ug | Freq: Once | INTRAMUSCULAR | Status: AC
  Administered 2013-06-27: 50 ug via INTRAVENOUS
  Filled 2013-06-27: qty 2

## 2013-06-27 MED ORDER — DIPHENHYDRAMINE HCL 50 MG/ML IJ SOLN
50.0000 mg | Freq: Once | INTRAMUSCULAR | Status: AC
  Administered 2013-06-27: 50 mg via INTRAVENOUS
  Filled 2013-06-27: qty 1

## 2013-06-27 MED ORDER — POTASSIUM CHLORIDE CRYS ER 20 MEQ PO TBCR: 20.0000 meq | EXTENDED_RELEASE_TABLET | Freq: Two times a day (BID) | ORAL | Status: DC

## 2013-06-27 MED ORDER — SODIUM CHLORIDE 0.9 % IV SOLN
1000.0000 mL | Freq: Once | INTRAVENOUS | Status: AC
  Administered 2013-06-27: 1000 mL via INTRAVENOUS

## 2013-06-27 MED ORDER — METOCLOPRAMIDE HCL 5 MG/ML IJ SOLN
10.0000 mg | Freq: Once | INTRAMUSCULAR | Status: AC
  Administered 2013-06-27: 10 mg via INTRAVENOUS
  Filled 2013-06-27: qty 2

## 2013-06-27 MED ORDER — SODIUM CHLORIDE 0.9 % IV SOLN: 1000.0000 mL | INTRAVENOUS | Status: DC

## 2013-06-27 MED ORDER — PROMETHAZINE HCL 25 MG/ML IJ SOLN
25.0000 mg | Freq: Once | INTRAMUSCULAR | Status: AC
  Administered 2013-06-27: 25 mg via INTRAVENOUS
  Filled 2013-06-27: qty 1

## 2013-06-27 NOTE — ED Provider Notes (Signed)
CSN: [id number]     Arrival date & time 06/27/13  1927 History  This chart was scribed for [name], MD by Ardelia Mems, ED Scribe. This patient was seen in room APA01/APA01 and the patient's care was started at 8:17 PM.    Chief Complaint  Patient presents with  . Diarrhea  . Abdominal Pain    The history is provided by the patient. No language interpreter was used.    HPI Comments: [name] is a 27 y.o. female with a history of severe obesity, GERD and gastritis who presents to the Emergency Department complaining of gradual onset, gradually worsening, constant, moderate, "sharp, throbbing" non-radiating abdominal pain, located just above the umbilicus, over the past 4 days. She states that her abdominal pain is worsened by positioning and sitting or standing up, and made better by lying supine. She reports associated intermittent nausea over the past 4 days, but denies emesis. She reports associated diarrhea, and states that she has about 10 episodes of watery diarrhea each day for the past 4 days. Her last episode of diarrhea was about 1 hour ago.She also reports associated mild dizziness. Pt was seen in the ED in March of 2014 for abdominal pain, which she states was similar to current pain. She received a CT of her abdomen which was normal. She also states that she has hemorrhoids. She denies sick contacts, recent antibiotic use and suspicious food intakes. She states that she also has hemorrhoids, which she has had for "a while". She states that she has not had any medication changes recently. She denies fever, chills, weakness, feeling light headed, rash or any other symptoms. She denies smoking and denies alcohol use. 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  PCP- Dr. Butch Penny Gastroenterologist- Dr. Darrick Penna  Past Medical History  Diagnosis Date  . Herpes   . GERD (gastroesophageal reflux disease)   . Gastritis   . Disk  prolapse   . Bulging disc    Past Surgical History  Procedure Laterality Date  . Wisdom tooth extraction    . Esophagogastroduodenoscopy  01/19/2012    WGN:FAOZHY,QMVHQION IN THE ANTRUM/HIATAL HERNIA/  . Colonoscopy with esophagogastroduodenoscopy (egd) N/A 02/24/2013    Procedure: COLONOSCOPY WITH ESOPHAGOGASTRODUODENOSCOPY (EGD);  Surgeon: West Bali, MD;  Location: [facility];  Service: Endoscopy;  Laterality: N/A;  8;30   Family History  Problem Relation Age of Onset  . Diabetes Mother   . Diabetes Other   . Hypertension Other   . Colon cancer Neg Hx   . Colon polyps Neg Hx    History  Substance Use Topics  . Smoking status: Former Smoker -- 0.25 packs/day for .5 years    Types: Cigarettes    Quit date: 07/28/2011  . Smokeless tobacco: Never Used  . Alcohol Use: No   OB History   Grav Para Term Preterm Abortions TAB SAB Ect Mult Living   1 1 1       1      Review of Systems  Constitutional: Negative for fever and chills.  Gastrointestinal: Positive for nausea, abdominal pain and diarrhea. Negative for vomiting.  Skin: Negative for rash.  Neurological: Positive for dizziness. Negative for weakness and light-headedness.  All other systems reviewed and are negative.   Allergies  Review of patient's allergies indicates no known allergies.  Home Medications   Current Outpatient Rx  Name  Route  Sig  Dispense  Refill  .  citalopram (CELEXA) 20 MG tablet   Oral   Take 20 mg by mouth daily.         . DULoxetine (CYMBALTA) 60 MG capsule   Oral   Take 60 mg by mouth at bedtime.         . gabapentin (NEURONTIN) 300 MG capsule   Oral   Take 300 mg by mouth 3 (three) times daily.         . hydrOXYzine (VISTARIL) 50 MG capsule   Oral   Take 50 mg by mouth 3 (three) times daily as needed for itching.         Marland Kitchen LORazepam (ATIVAN) 1 MG tablet   Oral   Take 1 mg by mouth every 8 (eight) hours.         Marland Kitchen lubiprostone (AMITIZA) 8 MCG capsule   Oral    Take 8 mcg by mouth 2 (two) times daily.         Marland Kitchen topiramate (TOPAMAX) 100 MG tablet   Oral   Take 100 mg by mouth daily.         . traMADol (ULTRAM) 50 MG tablet   Oral   Take 50-100 mg by mouth every 6 (six) hours as needed for pain.         . traZODone (DESYREL) 100 MG tablet   Oral   Take 200 mg by mouth at bedtime.          . valACYclovir (VALTREX) 1000 MG tablet   Oral   Take 1,000 mg by mouth at bedtime.         . potassium chloride SA (K-DUR,KLOR-CON) 20 MEQ tablet   Oral   Take 1 tablet (20 mEq total) by mouth 2 (two) times daily.   10 tablet   0   . promethazine (PHENERGAN) 25 MG tablet   Oral   Take 1 tablet (25 mg total) by mouth every 6 (six) hours as needed for nausea (or abdominal cramping).   10 tablet   0   . tiZANidine (ZANAFLEX) 2 MG tablet   Oral   Take 2 mg by mouth every 8 (eight) hours.          Triage Vitals: BP 120/69  Pu lb (151.048 kg)lse 88  Temp(Src) 98.3 F (36.8 C) (Oral)  Resp 18  Ht 5\' 6"  (1.676 m)  Wt 333  BMI 53.77 kg/m2  SpO2 98%  LMP 06/03/2013  Vital signs normal    Physical Exam  Nursing note and vitals reviewed. Constitutional: She is oriented to person, place, and time. She appears well-developed and well-nourished.  Non-toxic appearance. She does not appear ill. No distress.  Morbidly obese  HENT:  Head: Normocephalic and atraumatic.  Right Ear: External ear normal.  Left Ear: External ear normal.  Nose: Nose normal. No mucosal edema or rhinorrhea.  Mouth/Throat: Mucous membranes are normal. No dental abscesses or edematous.  Tongue is slightly dry.  Eyes: Conjunctivae and EOM are normal. Pupils are equal, round, and reactive to light.  Neck: Normal range of motion and full passive range of motion without pain. Neck supple.  Cardiovascular: Normal rate, regular rhythm and normal heart sounds.  Exam reveals no gallop and no friction rub.   No murmur heard. Pulmonary/Chest: Effort normal and breath  sounds normal. No respiratory distress. She has no wheezes. She has no rhonchi. She has no rales. She exhibits no tenderness and no crepitus.  Abdominal: Soft. Normal appearance and bowel sounds are normal. She  exhibits no distension. There is no tenderness. There is no rebound and no guarding.    Very obese.  Musculoskeletal: Normal range of motion. She exhibits no edema and no tenderness.  Moves all extremities well.   Neurological: She is alert and oriented to person, place, and time. She has normal strength. No cranial nerve deficit.  Skin: Skin is warm, dry and intact. No rash noted. No erythema. No pallor.  Psychiatric: She has a normal mood and affect. Her speech is normal and behavior is normal. Her mood appears not anxious.    ED Course   Medications  0.9 %  sodium chloride infusion (1,000 mLs Intravenous New Bag/Given 06/27/13 2112)    Followed by  0.9 %  sodium chloride infusion (not administered)  fentaNYL (SUBLIMAZE) injection 50 mcg (not administered)  loperamide (IMODIUM) capsule 4 mg (not administered)  metoCLOPramide (REGLAN) injection 10 mg (10 mg Intravenous Given 06/27/13 2112)  diphenhydrAMINE (BENADRYL) injection 50 mg (50 mg Intravenous Given 06/27/13 2112)  promethazine (PHENERGAN) injection 25 mg (25 mg Intravenous Given 06/27/13 2112)   Procedures (including critical care time)  DIAGNOSTIC STUDIES: Oxygen Saturation is 98% on RA, normal by my interpretation.    COORDINATION OF CARE: 8:22 PM- Pt advised of plan for diagnostic lab work, along with plan to receive medications in the ED and pt agrees.  This is this patient's ADD visit in 6 months. She has had several CT scans, in 2012 at most recently in March that were normal. She had a MR of her liver and [place] of her abdomen in March. ReView of the [employer] shows she has gotten approximately 16 narcotic prescriptions since February. Some from our ED, her PCP and Dr Gerilyn Pilgrim.   Results for orders  placed during the hospital encounter of 06/27/13  URINALYSIS, ROUTINE W REFLEX MICROSCOPIC      Result Value Range   Color, Urine YELLOW  YELLOW   APPearance HAZY (*) CLEAR   Specific Gravity, Urine >1.030 (*) 1.005 - 1.030   pH 6.0  5.0 - 8.0   Glucose, UA NEGATIVE  NEGATIVE mg/dL   Hgb urine dipstick LARGE (*) NEGATIVE   Bilirubin Urine NEGATIVE  NEGATIVE   Ketones, ur NEGATIVE  NEGATIVE mg/dL   Protein, ur NEGATIVE  NEGATIVE mg/dL   Urobilinogen, UA 1.0  0.0 - 1.0 mg/dL   Nitrite NEGATIVE  NEGATIVE   Leukocytes, UA NEGATIVE  NEGATIVE  URINE RAPID DRUG SCREEN (HOSP PERFORMED)      Result Value Range   Opiates NONE DETECTED  NONE DETECTED   Cocaine NONE DETECTED  NONE DETECTED   Benzodiazepines NONE DETECTED  NONE DETECTED   Amphetamines NONE DETECTED  NONE DETECTED   Tetrahydrocannabinol NONE DETECTED  NONE DETECTED   Barbiturates NONE DETECTED  NONE DETECTED  CBC WITH DIFFERENTIAL      Result Value Range   WBC 8.0  4.0 - 10.5 K/uL   RBC 4.44  3.87 - 5.11 MIL/uL   Hemoglobin 14.0  12.0 - 15.0 g/dL   HCT 16.1  09.6 - 04.5 %   MCV 89.0  78.0 - 100.0 fL   MCH 31.5  26.0 - 34.0 pg   MCHC 35.4  30.0 - 36.0 g/dL   RDW 40.9  81.1 - 91.4 %   Platelets 252  150 - 400 K/uL   Neutrophils Relative % 58  43 - 77 %   Neutro Abs 4.6  1.7 - 7.7 K/uL   Lymphocytes Relative 33  12 -  46 %   Lymphs Abs 2.6  0.7 - 4.0 K/uL   Monocytes Relative 9  3 - 12 %   Monocytes Absolute 0.7  0.1 - 1.0 K/uL   Eosinophils Relative 1  0 - 5 %   Eosinophils Absolute 0.1  0.0 - 0.7 K/uL   Basophils Relative 0  0 - 1 %   Basophils Absolute 0.0  0.0 - 0.1 K/uL  COMPREHENSIVE METABOLIC PANEL      Result Value Range   Sodium 141  135 - 145 mEq/L   Potassium 3.1 (*) 3.5 - 5.1 mEq/L   Chloride 107  96 - 112 mEq/L   CO2 23  19 - 32 mEq/L   Glucose, Bld 102 (*) 70 - 99 mg/dL   BUN 5 (*) 6 - 23 mg/dL   Creatinine, Ser 4.09  0.50 - 1.10 mg/dL   Calcium 9.4  8.4 - 81.1 mg/dL   Total Protein 7.2  6.0 - 8.3  g/dL   Albumin 3.6  3.5 - 5.2 g/dL   AST 52 (*) 0 - 37 U/L   ALT 32  0 - 35 U/L   Alkaline Phosphatase 111  39 - 117 U/L   Total Bilirubin 0.4  0.3 - 1.2 mg/dL   GFR calc non Af Amer 79 (*) >90 mL/min   GFR calc Af Amer >90  >90 mL/min  LIPASE, BLOOD      Result Value Range   Lipase 42  11 - 59 U/L  URINE MICROSCOPIC-ADD ON      Result Value Range   Squamous Epithelial / LPF MANY (*) RARE   WBC, UA 0-2  <3 WBC/hpf   RBC / HPF 11-20  <3 RBC/hpf   Bacteria, UA FEW (*) RARE   Urine-Other MUCOUS PRESENT     Laboratory interpretation all normal except for hypokalemia, contaminated urine    1. Abdominal pain   2. Diarrhea   3. Hypokalemia     New Prescriptions   POTASSIUM CHLORIDE SA (K-DUR,KLOR-CON) 20 MEQ TABLET    Take 1 tablet (20 mEq total) by mouth 2 (two) times daily.   PROMETHAZINE (PHENERGAN) 25 MG TABLET    Take 1 tablet (25 mg total) by mouth every 6 (six) hours as needed for nausea (or abdominal cramping).    Plan discharge   Devoria Albe, MD, FACEP   MDM    I personally performed the services described in this documentation, which was scribed in my presence. The recorded information has been reviewed and considered.  Devoria Albe, MD, Armando Gang    [name], MD 06/27/13 [phone]

## 2013-06-27 NOTE — ED Notes (Signed)
Pt with abd pain and diarrhea since Sat., denies N/V

## 2013-06-27 NOTE — ED Notes (Signed)
Pt wants something for pain, EDP notified. 

## 2013-06-28 ENCOUNTER — Telehealth: Payer: Self-pay | Admitting: Internal Medicine

## 2013-06-28 NOTE — Telephone Encounter (Signed)
Individual called me after hours last evening stating she was patient's mother. Called on behalf of patient stating she was having severe diarrhea for 4 days with abdominal pain and was getting sicker. She could not provide additional information as she did not have direct contact with patient. Subsequently, the patient called me on my cell phone to report a 4 day history of incessant nonbloody diarrhea in which she now describes as severe abdominal pain. States she "could not stand it any longer ".  Patient states she had not called [place] until now.  I recommended the patient proceed to the ED for further evaluation as she may have a significant underlying condition.

## 2013-06-28 NOTE — Telephone Encounter (Signed)
REVIEWED.  

## 2013-06-29 ENCOUNTER — Encounter: Payer: Self-pay | Admitting: Gastroenterology

## 2013-07-03 ENCOUNTER — Telehealth: Payer: Self-pay | Admitting: *Deleted

## 2013-07-03 ENCOUNTER — Ambulatory Visit: Payer: Medicaid Other | Admitting: Gastroenterology

## 2013-07-03 NOTE — Telephone Encounter (Signed)
Please send letter for f/u.  

## 2013-07-03 NOTE — Telephone Encounter (Signed)
Pt is a no show

## 2013-07-04 ENCOUNTER — Encounter: Payer: Self-pay | Admitting: *Deleted

## 2013-07-04 NOTE — Telephone Encounter (Signed)
Letter sent to pt

## 2013-07-17 ENCOUNTER — Encounter (HOSPITAL_COMMUNITY): Payer: Self-pay | Admitting: *Deleted

## 2013-07-17 ENCOUNTER — Emergency Department (HOSPITAL_COMMUNITY)
Admission: EM | Admit: 2013-07-17 | Discharge: 2013-07-17 | Disposition: A | Discharge status: Home / Self Care | Payer: Medicaid Other | Attending: Emergency Medicine | Admitting: Emergency Medicine

## 2013-07-17 ENCOUNTER — Emergency Department (HOSPITAL_COMMUNITY): Payer: Medicaid Other

## 2013-07-17 DIAGNOSIS — Z8739 Personal history of other diseases of the musculoskeletal system and connective tissue: Secondary | ICD-10-CM | POA: Insufficient documentation

## 2013-07-17 DIAGNOSIS — Z87891 Personal history of nicotine dependence: Secondary | ICD-10-CM | POA: Insufficient documentation

## 2013-07-17 DIAGNOSIS — Y9389 Activity, other specified: Secondary | ICD-10-CM | POA: Insufficient documentation

## 2013-07-17 DIAGNOSIS — R296 Repeated falls: Secondary | ICD-10-CM | POA: Insufficient documentation

## 2013-07-17 DIAGNOSIS — J189 Pneumonia, unspecified organism: Secondary | ICD-10-CM

## 2013-07-17 DIAGNOSIS — E860 Dehydration: Secondary | ICD-10-CM

## 2013-07-17 DIAGNOSIS — J159 Unspecified bacterial pneumonia: Secondary | ICD-10-CM | POA: Insufficient documentation

## 2013-07-17 DIAGNOSIS — R109 Unspecified abdominal pain: Secondary | ICD-10-CM | POA: Insufficient documentation

## 2013-07-17 DIAGNOSIS — Y929 Unspecified place or not applicable: Secondary | ICD-10-CM | POA: Insufficient documentation

## 2013-07-17 DIAGNOSIS — R110 Nausea: Secondary | ICD-10-CM | POA: Insufficient documentation

## 2013-07-17 DIAGNOSIS — R197 Diarrhea, unspecified: Secondary | ICD-10-CM | POA: Insufficient documentation

## 2013-07-17 DIAGNOSIS — Z8619 Personal history of other infectious and parasitic diseases: Secondary | ICD-10-CM | POA: Insufficient documentation

## 2013-07-17 DIAGNOSIS — Z79899 Other long term (current) drug therapy: Secondary | ICD-10-CM | POA: Insufficient documentation

## 2013-07-17 DIAGNOSIS — IMO0002 Reserved for concepts with insufficient information to code with codable children: Secondary | ICD-10-CM | POA: Insufficient documentation

## 2013-07-17 DIAGNOSIS — Z3202 Encounter for pregnancy test, result negative: Secondary | ICD-10-CM | POA: Insufficient documentation

## 2013-07-17 LAB — URINALYSIS, ROUTINE W REFLEX MICROSCOPIC
Hgb urine dipstick: NEGATIVE
Nitrite: NEGATIVE
Protein, ur: NEGATIVE mg/dL
Urobilinogen, UA: 2 mg/dL — ABNORMAL HIGH (ref 0.0–1.0)

## 2013-07-17 LAB — CBC WITH DIFFERENTIAL/PLATELET
Eosinophils Absolute: 0 10*3/uL (ref 0.0–0.7)
Eosinophils Relative: 1 % (ref 0–5)
HCT: 39.1 % (ref 36.0–46.0)
Lymphocytes Relative: 15 % (ref 12–46)
Lymphs Abs: 1.2 10*3/uL (ref 0.7–4.0)
MCH: 31.9 pg (ref 26.0–34.0)
MCV: 89.7 fL (ref 78.0–100.0)
Monocytes Absolute: 0.9 10*3/uL (ref 0.1–1.0)
Monocytes Relative: 11 % (ref 3–12)
RBC: 4.36 MIL/uL (ref 3.87–5.11)
WBC: 7.9 10*3/uL (ref 4.0–10.5)

## 2013-07-17 LAB — POCT PREGNANCY, URINE: Preg Test, Ur: NEGATIVE

## 2013-07-17 LAB — COMPREHENSIVE METABOLIC PANEL
ALT: 35 U/L (ref 0–35)
BUN: 7 mg/dL (ref 6–23)
CO2: 22 mEq/L (ref 19–32)
Calcium: 9.1 mg/dL (ref 8.4–10.5)
Creatinine, Ser: 0.99 mg/dL (ref 0.50–1.10)
GFR calc Af Amer: >90 mL/min (ref >90)
GFR calc non Af Amer: 79 mL/min — ABNORMAL LOW (ref >90)
Glucose, Bld: 104 mg/dL — ABNORMAL HIGH (ref 70–99)

## 2013-07-17 LAB — LIPASE, BLOOD: Lipase: 29 U/L (ref 11–59)

## 2013-07-17 MED ORDER — LEVOFLOXACIN IN D5W 750 MG/150ML IV SOLN
750.0000 mg | Freq: Once | INTRAVENOUS | Status: AC
  Administered 2013-07-17: 750 mg via INTRAVENOUS
  Filled 2013-07-17: qty 150

## 2013-07-17 MED ORDER — ONDANSETRON HCL 4 MG/2ML IJ SOLN
4.0000 mg | Freq: Once | INTRAMUSCULAR | Status: AC
  Administered 2013-07-17: 4 mg via INTRAVENOUS
  Filled 2013-07-17: qty 2

## 2013-07-17 MED ORDER — SODIUM CHLORIDE 0.9 % IV BOLUS (SEPSIS)
1000.0000 mL | Freq: Once | INTRAVENOUS | Status: AC
  Administered 2013-07-17: 1000 mL via INTRAVENOUS

## 2013-07-17 MED ORDER — LEVOFLOXACIN 750 MG PO TABS: 750.0000 mg | ORAL_TABLET | Freq: Every day | ORAL | Status: DC

## 2013-07-17 MED ORDER — ONDANSETRON HCL 4 MG PO TABS: 4.0000 mg | ORAL_TABLET | Freq: Four times a day (QID) | ORAL | Status: DC

## 2013-07-17 MED ORDER — METOCLOPRAMIDE HCL 5 MG/ML IJ SOLN
10.0000 mg | Freq: Once | INTRAMUSCULAR | Status: AC
  Administered 2013-07-17: 10 mg via INTRAVENOUS
  Filled 2013-07-17: qty 2

## 2013-07-17 MED ORDER — TRAMADOL HCL 50 MG PO TABS: 50.0000 mg | ORAL_TABLET | Freq: Four times a day (QID) | ORAL | Status: DC | PRN

## 2013-07-17 MED ORDER — MORPHINE SULFATE 4 MG/ML IJ SOLN
4.0000 mg | Freq: Once | INTRAMUSCULAR | Status: AC
  Administered 2013-07-17: 4 mg via INTRAVENOUS
  Filled 2013-07-17: qty 1

## 2013-07-17 NOTE — ED Notes (Signed)
Cough,non productive, abd pain, nausea, no vomiting, diarrhea for 2 days.  Fell 3 days ago , has abrasion to rt knee.

## 2013-07-17 NOTE — ED Provider Notes (Signed)
CSN: [id number]     Arrival date & time 07/17/13  1213 History  This chart was scribed for Isabel Crease, MD by Bennett Scrape, ED Scribe. This patient was seen in room APA08/APA08 and the patient's care was started at 12:44 PM.   Chief Complaint  Patient presents with  . Cough    The history is provided by the patient. No language interpreter was used.    HPI Comments: [name] is a 27 y.o. female who presents to the Emergency Department complaining of 4 days of gradual onset, constant non-productive cough with 2 days of associated wheezing, upper abdominal pain, nausea and diarrhea. She describes the abdominal pain as a soreness that is aggravated with coughing. She denies having a h/o asthma or prior episodes of wheezing but is a former smoker. She denies emesis as an associated symptom. She also reports a fall 3 days ago after feeling dizzy with an associated abrasion to the right knee. She reports decreased ROM of the right knee secondary to pain since the fall. She denies having any prior problems with the right knee.    Past Medical History  Diagnosis Date  . Herpes   . GERD (gastroesophageal reflux disease)   . Gastritis   . Disk prolapse   . Bulging disc    Past Surgical History  Procedure Laterality Date  . Wisdom tooth extraction    . Esophagogastroduodenoscopy  01/19/2012    WUJ:WJXBJY,NWGNFAOZ IN THE ANTRUM/HIATAL HERNIA/  . Colonoscopy with esophagogastroduodenoscopy (egd) N/A 02/24/2013    HYQ:MVHQIONGEXBM RECTAL BLEEDING DUE TO Moderate sized internal hemorrhoids   Family History  Problem Relation Age of Onset  . Diabetes Mother   . Diabetes Other   . Hypertension Other   . Colon cancer Neg Hx   . Colon polyps Neg Hx    History  Substance Use Topics  . Smoking status: Former Smoker -- 0.25 packs/day for .5 years    Types: Cigarettes    Quit date: 07/28/2011  . Smokeless tobacco: Never Used  . Alcohol Use: No   OB History   Grav Para Term  Preterm Abortions TAB SAB Ect Mult Living   1 1 1       1      Review of Systems  Respiratory: Positive for cough. Negative for shortness of breath.   Gastrointestinal: Positive for nausea, abdominal pain and diarrhea. Negative for vomiting.  Musculoskeletal: Positive for arthralgias.  Skin: Positive for wound.  All other systems reviewed and are negative.    Allergies  Review of patient's allergies indicates no known allergies.  Home Medications   Current Outpatient Rx  Name  Route  Sig  Dispense  Refill  . citalopram (CELEXA) 20 MG tablet   Oral   Take 20 mg by mouth daily.         . DULoxetine (CYMBALTA) 60 MG capsule   Oral   Take 60 mg by mouth at bedtime.         . gabapentin (NEURONTIN) 300 MG capsule   Oral   Take 300 mg by mouth 3 (three) times daily.         . hydrOXYzine (VISTARIL) 50 MG capsule   Oral   Take 50 mg by mouth 3 (three) times daily as needed for itching.         Marland Kitchen LORazepam (ATIVAN) 1 MG tablet   Oral   Take 1 mg by mouth every 8 (eight) hours.         Marland Kitchen  lubiprostone (AMITIZA) 8 MCG capsule   Oral   Take 8 mcg by mouth 2 (two) times daily.         . potassium chloride SA (K-DUR,KLOR-CON) 20 MEQ tablet   Oral   Take 1 tablet (20 mEq total) by mouth 2 (two) times daily.   10 tablet   0   . promethazine (PHENERGAN) 25 MG tablet   Oral   Take 1 tablet (25 mg total) by mouth every 6 (six) hours as needed for nausea (or abdominal cramping).   10 tablet   0   . tiZANidine (ZANAFLEX) 2 MG tablet   Oral   Take 2 mg by mouth every 8 (eight) hours.         . topiramate (TOPAMAX) 100 MG tablet   Oral   Take 100 mg by mouth daily.         . traMADol (ULTRAM) 50 MG tablet   Oral   Take 50-100 mg by mouth every 6 (six) hours as needed for pain.         . traZODone (DESYREL) 100 MG tablet   Oral   Take 200 mg by mouth at bedtime.          . valACYclovir (VALTREX) 1000 MG tablet   Oral   Take 1,000 mg by mouth at  bedtime.          Triage Vitals: BP 114/77  Pulse 108  Temp(Src) 99.5 F (37.5 C) (Oral)  Resp 20  Ht 5\' 6"  (1.676 m)  Wt 333 lb (151.048 kg)  BMI 53.77 kg/m2  SpO2 96%  LMP 07/13/2013  Physical Exam  Nursing note and vitals reviewed. Constitutional: She is oriented to person, place, and time. She appears well-developed and well-nourished. No distress.  HENT:  Head: Normocephalic and atraumatic.  Right Ear: Hearing normal.  Left Ear: Hearing normal.  Nose: Nose normal.  Mouth/Throat: Oropharynx is clear and moist and mucous membranes are normal.  Eyes: Conjunctivae and EOM are normal. Pupils are equal, round, and reactive to light.  Neck: Normal range of motion. Neck supple.  Cardiovascular: Normal rate, regular rhythm, S1 normal and S2 normal.  Exam reveals no gallop and no friction rub.   No murmur heard. Pulmonary/Chest: Effort normal and breath sounds normal. No respiratory distress. She exhibits no tenderness.  Abdominal: Soft. Normal appearance and bowel sounds are normal. There is no hepatosplenomegaly. There is no tenderness. There is no rebound, no guarding, no tenderness at McBurney's point and negative Murphy's sign. No hernia.  Musculoskeletal: Normal range of motion.  Neurological: She is alert and oriented to person, place, and time. She has normal strength. No cranial nerve deficit or sensory deficit. Coordination normal. GCS eye subscore is 4. GCS verbal subscore is 5. GCS motor subscore is 6.  Skin: Skin is warm, dry and intact. No rash noted. No cyanosis.  Psychiatric: She has a normal mood and affect. Her speech is normal and behavior is normal. Thought content normal.    ED Course  Procedures (including critical care time)  DIAGNOSTIC STUDIES: Oxygen Saturation is 96% on room air, normal by my interpretation.    COORDINATION OF CARE: 12:46 PM-Discussed treatment plan with pt at bedside and pt agreed to plan.   Labs Review Labs Reviewed   COMPREHENSIVE METABOLIC PANEL - Abnormal; Notable for the following:    Glucose, Bld 104 (*)    Albumin 3.0 (*)    AST 48 (*)    GFR calc non Af  Amer 22 (*)    All other components within normal limits  URINALYSIS, ROUTINE W REFLEX MICROSCOPIC - Abnormal; Notable for the following:    Urobilinogen, UA 2.0 (*)    Leukocytes, UA MODERATE (*)    All other components within normal limits  URINE MICROSCOPIC-ADD ON - Abnormal; Notable for the following:    Bacteria, UA FEW (*)    Crystals CA OXALATE CRYSTALS (*)    All other components within normal limits  URINE CULTURE  CBC WITH DIFFERENTIAL  LIPASE, BLOOD  POCT PREGNANCY, URINE   Imaging Review Dg Abd Acute W/chest  07/17/2013   *RADIOLOGY REPORT*  Clinical Data: . Cough, chest pain and shortness of breath  ACUTE ABDOMEN SERIES (ABDOMEN 2 VIEW & CHEST 1 VIEW)  Comparison: 02/11/2013 and prior chest radiographs  Findings: Left lower lung opacity and faint right upper lobe opacity are noted - suspicious for pneumonia. The cardiomediastinal silhouette is unremarkable. There is no evidence of pleural effusion, pneumothorax or mass.  The bowel gas pattern is unremarkable. There is no evidence of bowel obstruction or pneumoperitoneum. No suspicious calcifications are present. No acute bony abnormalities are noted.  IMPRESSION: Left lower lung and faint right upper lobe opacities suspicious for pneumonia.  Radiograph follow-up to resolution is recommended.  Unremarkable bowel gas pattern.   Original Report Authenticated By: Harmon Pier, M.D.    MDM  Diagnosis: Community acquired pneumonia  Presented to the ER with multiple complaints. Patient reports that she has had nausea, vomiting and diarrhea with generalized weakness. This reports that she became acutely dizzy when trying to ambulate and fell, injuring her right knee. Abdominal exam is benign. She has mild tenderness diffusely without any focality or signs of peritonitis. No acute surgical  process suspected.  Patient also complaining of cough and shortness of breath. She had low-grade temperature at arrival to the ER. Oxygen saturations, however are unremarkable. Chest x-ray does confirm pneumonia. Patient will be administered IV Levaquin here in the ER, discharged on by mouth Levaquin, symptomatic treatment. Follow up with her primary doctor in one to 2 days. Return if her symptoms worsen.   I personally performed the services described in this documentation, which was scribed in my presence. The recorded information has been reviewed and is accurate.   Isabel Crease, MD 07/17/13 [phone]

## 2013-07-18 LAB — URINE CULTURE: Colony Count: NO GROWTH

## 2013-07-24 ENCOUNTER — Telehealth: Payer: Self-pay | Admitting: Obstetrics & Gynecology

## 2013-07-24 NOTE — Telephone Encounter (Signed)
Pt advised to see PCP and see if related to pneumonia.

## 2013-07-25 ENCOUNTER — Emergency Department (HOSPITAL_COMMUNITY): Payer: Medicaid Other

## 2013-07-25 ENCOUNTER — Encounter (HOSPITAL_COMMUNITY): Payer: Self-pay | Admitting: *Deleted

## 2013-07-25 ENCOUNTER — Emergency Department (HOSPITAL_COMMUNITY)
Admission: EM | Admit: 2013-07-25 | Discharge: 2013-07-25 | Disposition: A | Discharge status: Home / Self Care | Payer: Medicaid Other | Attending: Emergency Medicine | Admitting: Emergency Medicine

## 2013-07-25 DIAGNOSIS — E669 Obesity, unspecified: Secondary | ICD-10-CM | POA: Insufficient documentation

## 2013-07-25 DIAGNOSIS — R062 Wheezing: Secondary | ICD-10-CM | POA: Insufficient documentation

## 2013-07-25 DIAGNOSIS — Z87891 Personal history of nicotine dependence: Secondary | ICD-10-CM | POA: Insufficient documentation

## 2013-07-25 DIAGNOSIS — J189 Pneumonia, unspecified organism: Secondary | ICD-10-CM

## 2013-07-25 DIAGNOSIS — R079 Chest pain, unspecified: Secondary | ICD-10-CM | POA: Insufficient documentation

## 2013-07-25 DIAGNOSIS — K219 Gastro-esophageal reflux disease without esophagitis: Secondary | ICD-10-CM | POA: Insufficient documentation

## 2013-07-25 DIAGNOSIS — Z79899 Other long term (current) drug therapy: Secondary | ICD-10-CM | POA: Insufficient documentation

## 2013-07-25 DIAGNOSIS — Z8719 Personal history of other diseases of the digestive system: Secondary | ICD-10-CM | POA: Insufficient documentation

## 2013-07-25 DIAGNOSIS — Z8739 Personal history of other diseases of the musculoskeletal system and connective tissue: Secondary | ICD-10-CM | POA: Insufficient documentation

## 2013-07-25 DIAGNOSIS — Z8619 Personal history of other infectious and parasitic diseases: Secondary | ICD-10-CM | POA: Insufficient documentation

## 2013-07-25 DIAGNOSIS — Z3202 Encounter for pregnancy test, result negative: Secondary | ICD-10-CM | POA: Insufficient documentation

## 2013-07-25 DIAGNOSIS — R509 Fever, unspecified: Secondary | ICD-10-CM | POA: Insufficient documentation

## 2013-07-25 DIAGNOSIS — R5381 Other malaise: Secondary | ICD-10-CM | POA: Insufficient documentation

## 2013-07-25 DIAGNOSIS — J159 Unspecified bacterial pneumonia: Secondary | ICD-10-CM | POA: Insufficient documentation

## 2013-07-25 LAB — PREGNANCY, URINE: Preg Test, Ur: NEGATIVE

## 2013-07-25 LAB — CBC WITH DIFFERENTIAL/PLATELET
Basophils Absolute: 0 10*3/uL (ref 0.0–0.1)
Basophils Relative: 0 % (ref 0–1)
Eosinophils Absolute: 0.2 10*3/uL (ref 0.0–0.7)
Hemoglobin: 14.2 g/dL (ref 12.0–15.0)
MCH: 31.7 pg (ref 26.0–34.0)
MCHC: 35.8 g/dL (ref 30.0–36.0)
Monocytes Relative: 7 % (ref 3–12)
Neutro Abs: 3.9 10*3/uL (ref 1.7–7.7)
Neutrophils Relative %: 60 % (ref 43–77)
Platelets: 215 10*3/uL (ref 150–400)

## 2013-07-25 LAB — BASIC METABOLIC PANEL
BUN: 9 mg/dL (ref 6–23)
Chloride: 108 mEq/L (ref 96–112)
GFR calc Af Amer: 80 mL/min — ABNORMAL LOW (ref >90)
GFR calc non Af Amer: 69 mL/min — ABNORMAL LOW (ref >90)
Potassium: 3.3 mEq/L — ABNORMAL LOW (ref 3.5–5.1)
Sodium: 139 mEq/L (ref 135–145)

## 2013-07-25 LAB — URINALYSIS, ROUTINE W REFLEX MICROSCOPIC
Bilirubin Urine: NEGATIVE
Nitrite: NEGATIVE
Specific Gravity, Urine: <1.005 — ABNORMAL LOW (ref 1.005–1.030)
Urobilinogen, UA: 1 mg/dL (ref 0.0–1.0)
pH: 5.5 (ref 5.0–8.0)

## 2013-07-25 LAB — URINE MICROSCOPIC-ADD ON

## 2013-07-25 LAB — D-DIMER, QUANTITATIVE: D-Dimer, Quant: 1.13 ug/mL-FEU — ABNORMAL HIGH (ref 0.00–0.48)

## 2013-07-25 MED ORDER — IPRATROPIUM BROMIDE 0.02 % IN SOLN
0.5000 mg | Freq: Once | RESPIRATORY_TRACT | Status: AC
  Administered 2013-07-25: 0.5 mg via RESPIRATORY_TRACT
  Filled 2013-07-25: qty 2.5

## 2013-07-25 MED ORDER — AZITHROMYCIN 250 MG PO TABS
500.0000 mg | ORAL_TABLET | Freq: Once | ORAL | Status: AC
  Administered 2013-07-25: 500 mg via ORAL
  Filled 2013-07-25: qty 2

## 2013-07-25 MED ORDER — KETOROLAC TROMETHAMINE 30 MG/ML IJ SOLN
30.0000 mg | Freq: Once | INTRAMUSCULAR | Status: AC
Start: 2013-07-25 — End: 2013-07-25
  Administered 2013-07-25: 30 mg via INTRAVENOUS
  Filled 2013-07-25: qty 1

## 2013-07-25 MED ORDER — SODIUM CHLORIDE 0.9 % IV BOLUS (SEPSIS)
1000.0000 mL | Freq: Once | INTRAVENOUS | Status: AC
  Administered 2013-07-25: 1000 mL via INTRAVENOUS

## 2013-07-25 MED ORDER — ALBUTEROL SULFATE HFA 108 (90 BASE) MCG/ACT IN AERS: 1.0000 | INHALATION_SPRAY | Freq: Four times a day (QID) | RESPIRATORY_TRACT | Status: DC | PRN

## 2013-07-25 MED ORDER — ALBUTEROL SULFATE (5 MG/ML) 0.5% IN NEBU
2.5000 mg | INHALATION_SOLUTION | Freq: Once | RESPIRATORY_TRACT | Status: AC
  Administered 2013-07-25: 2.5 mg via RESPIRATORY_TRACT
  Filled 2013-07-25: qty 0.5

## 2013-07-25 MED ORDER — AZITHROMYCIN 250 MG PO TABS: 250.0000 mg | ORAL_TABLET | Freq: Every day | ORAL | Status: DC

## 2013-07-25 MED ORDER — SODIUM CHLORIDE 0.9 % IV SOLN
Freq: Once | INTRAVENOUS | Status: AC
  Administered 2013-07-25: 15:00:00 via INTRAVENOUS

## 2013-07-25 MED ORDER — IOHEXOL 350 MG/ML SOLN
100.0000 mL | Freq: Once | INTRAVENOUS | Status: AC | PRN
  Administered 2013-07-25: 100 mL via INTRAVENOUS

## 2013-07-25 NOTE — ED Notes (Signed)
Patient transported to X-ray 

## 2013-07-25 NOTE — ED Notes (Signed)
Pt up to restroom with steady gait.

## 2013-07-25 NOTE — ED Notes (Signed)
Pt returns to er for further evaluation of pneumonia, was seen in er on 07/17/2013, diagnosed with cap, given Levaquin, when first asked when she finished the antibiotics pt stated 4 days ago, when asked again pt states that thinks that it was two days ago, pt states that she is still having a hard time breathing, will stop breathing at night, wheezing, feels sob, has not been able to follow up with pcp because she no longer has pcp.

## 2013-07-25 NOTE — ED Notes (Signed)
Pt reports dizziness with position changes.  PA notified and bolus fluid ordered along with PO fluid trial.

## 2013-07-25 NOTE — ED Notes (Signed)
PA notified of bp and requesting orthostatic vs before being d/c.

## 2013-07-25 NOTE — ED Notes (Signed)
Pt has drank 2 cokes and tolerating well.  nad noted.  Waiting for fluids to finish infusing.

## 2013-07-25 NOTE — ED Notes (Signed)
Pt attempted to provide urine sample however, was unable to provide enough for a sample. Will attempt later.

## 2013-07-25 NOTE — ED Notes (Signed)
PA at bedside.

## 2013-07-25 NOTE — ED Provider Notes (Signed)
CSN: [id number]     Arrival date & time 07/25/13  3875 History   First MD Initiated Contact with Patient 07/25/13 [phone]     Chief Complaint  Patient presents with  . Follow-up   (Consider location/radiation/quality/duration/timing/severity/associated sxs/prior Treatment) HPI Comments: [name] is a 27 y.o. Female presenting for reevaluation of her pneumonia.  She was diagnosed with CAP 8 days ago and has completed a 5 day course of levaquin.  She reports persistent cough which has been a wet cough although nonproductive,  Burning chest pain with coughing , occasional wheezing and wakes up choking and coughing at night, and believes she has stopped breathing a few times in her sleep (since she wakes up coughing).  She finished her antibiotics several days ago and has taken otc tussin without cough relief.  She reports low grade fevers (to 99.5 checked at home.)  She denies history of asthma, and has a distant history of tobacco abuse.  She also has complaint of right nipple pain which started yesterday which is also worsened with coughing and palpation.  She denies nipple discharge, injury and does not feel any abnormalities such as nodules or mass.  Her LMP was 1 week ago.  She is on OCP's and denies left breast pain.   The history is provided by the patient.    Past Medical History  Diagnosis Date  . Herpes   . GERD (gastroesophageal reflux disease)   . Gastritis   . Disk prolapse   . Bulging disc    Past Surgical History  Procedure Laterality Date  . Wisdom tooth extraction    . Esophagogastroduodenoscopy  01/19/2012    IRJ:JOACZY,SAYTKZSW IN THE ANTRUM/HIATAL HERNIA/  . Colonoscopy with esophagogastroduodenoscopy (egd) N/A 02/24/2013    FUX:NATFTDDUKGUR RECTAL BLEEDING DUE TO Moderate sized internal hemorrhoids   Family History  Problem Relation Age of Onset  . Diabetes Mother   . Diabetes Other   . Hypertension Other   . Colon cancer Neg Hx   . Colon polyps Neg Hx     History  Substance Use Topics  . Smoking status: Former Smoker -- 0.25 packs/day for .5 years    Types: Cigarettes    Quit date: 07/28/2011  . Smokeless tobacco: Never Used  . Alcohol Use: No   OB History   Grav Para Term Preterm Abortions TAB SAB Ect Mult Living   1 1 1       1      Review of Systems  Constitutional: Positive for fatigue. Negative for fever.  HENT: Negative for congestion, sore throat, rhinorrhea and neck pain.   Eyes: Negative.   Respiratory: Positive for cough, shortness of breath and wheezing. Negative for chest tightness.   Cardiovascular: Positive for chest pain.  Gastrointestinal: Negative for nausea and abdominal pain.  Genitourinary: Negative.   Musculoskeletal: Negative for joint swelling and arthralgias.  Skin: Negative.  Negative for rash and wound.  Neurological: Negative for dizziness, weakness, light-headedness, numbness and headaches.  Psychiatric/Behavioral: Negative.     Allergies  Review of patient's allergies indicates no known allergies.  Home Medications   Current Outpatient Rx  Name  Route  Sig  Dispense  Refill  . citalopram (CELEXA) 20 MG tablet   Oral   Take 20 mg by mouth daily.         . DULoxetine (CYMBALTA) 60 MG capsule   Oral   Take 60 mg by mouth at bedtime.         . hydrOXYzine (  VISTARIL) 50 MG capsule   Oral   Take 50 mg by mouth 3 (three) times daily as needed for itching.         Marland Kitchen LORazepam (ATIVAN) 1 MG tablet   Oral   Take 1 mg by mouth 3 (three) times daily.          Marland Kitchen lubiprostone (AMITIZA) 8 MCG capsule   Oral   Take 8 mcg by mouth 2 (two) times daily.         Marland Kitchen tiZANidine (ZANAFLEX) 2 MG tablet   Oral   Take 2 mg by mouth at bedtime.          . topiramate (TOPAMAX) 100 MG tablet   Oral   Take 100 mg by mouth daily.         . traMADol (ULTRAM) 50 MG tablet   Oral   Take 1 tablet (50 mg total) by mouth every 6 (six) hours as needed for pain.   15 tablet   0   . traZODone  (DESYREL) 100 MG tablet   Oral   Take 200 mg by mouth at bedtime.          . valACYclovir (VALTREX) 1000 MG tablet   Oral   Take 1,000 mg by mouth at bedtime.         Marland Kitchen albuterol (PROVENTIL HFA;VENTOLIN HFA) 108 (90 BASE) MCG/ACT inhaler   Inhalation   Inhale 1-2 puffs into the lungs every 6 (six) hours as needed for wheezing.   1 Inhaler   0   . azithromycin (ZITHROMAX Z-PAK) 250 MG tablet   Oral   Take 1 tablet (250 mg total) by mouth daily.   4 tablet   0   . levofloxacin (LEVAQUIN) 750 MG tablet   Oral   Take 1 tablet (750 mg total) by mouth daily. X 7 days   5 tablet   0    BP 107/67  Pulse 86  Temp(Src) 98 F (36.7 C) (Oral)  Resp 20  SpO2 100%  LMP 07/13/2013 Physical Exam  Nursing note and vitals reviewed. Constitutional: She appears well-developed and well-nourished.  obese  HENT:  Head: Normocephalic and atraumatic.  Eyes: Conjunctivae are normal.  Neck: Normal range of motion.  Cardiovascular: Normal rate, regular rhythm, normal heart sounds and intact distal pulses.   Pulmonary/Chest: Effort normal. She has wheezes.  Occasional right mid lung field expiratory wheeze,  Resolves with cough, which is wet sounding. No rhonchi, no rales.  ttp right nipple,  No discharge, mass, inversion or dimpling noted.  Abdominal: Soft. Bowel sounds are normal. There is no tenderness.  Musculoskeletal: Normal range of motion.  Neurological: She is alert.  Skin: Skin is warm and dry.  Psychiatric: She has a normal mood and affect.    ED Course  Procedures (including critical care time) Labs Review Labs Reviewed  BASIC METABOLIC PANEL - Abnormal; Notable for the following:    Potassium 3.3 (*)    Glucose, Bld 137 (*)    Creatinine, Ser 1.11 (*)    GFR calc non Af Amer 69 (*)    GFR calc Af Amer 80 (*)    All other components within normal limits  URINALYSIS, ROUTINE W REFLEX MICROSCOPIC - Abnormal; Notable for the following:    Specific Gravity, Urine <1.005  (*)    Hgb urine dipstick TRACE (*)    Leukocytes, UA TRACE (*)    All other components within normal limits  D-DIMER, QUANTITATIVE - Abnormal; Notable  for the following:    D-Dimer, Quant 1.13 (*)    All other components within normal limits  CBC WITH DIFFERENTIAL  PREGNANCY, URINE  URINE MICROSCOPIC-ADD ON   Imaging Review Dg Chest 2 View  07/25/2013   *RADIOLOGY REPORT*  Clinical Data: Cough, shortness of breath, wheezing, recent pneumonia  CHEST - 2 VIEW  Comparison: 07/17/2013  Findings: Normal heart size, mediastinal contours, and pulmonary vascularity. Lungs clear. No pleural effusion or pneumothorax. Bones unremarkable.  IMPRESSION: No acute abnormalities. Previously identified pulmonary infiltrates resolved.   Original Report Authenticated By: Ulyses Southward, M.D.   Ct Angio Chest Pe W/cm &/or Wo Cm  07/25/2013   *RADIOLOGY REPORT*  Clinical Data: Shortness of breath and chest pain; tachycardia  CT ANGIOGRAPHY CHEST  Technique:  Multidetector CT imaging of the chest using the standard protocol during bolus administration of intravenous contrast. Multiplanar reconstructed images including MIPs were obtained and reviewed to evaluate the vascular anatomy.  Contrast: OMNIPAQUE IOHEXOL 350 MG/ML SOLN  Comparison: Chest radiograph July 25, 2013 and CT angiogram chest January 03, 2012  Findings: There is no demonstrable pulmonary embolus.  There is no thoracic aortic aneurysm or dissection.  There is airspace consolidation in the superior segment right lower lobe.  There is more patchy infiltrate in the lateral and posterior segments of the left lower lobe.  There is no appreciable thoracic adenopathy.  Pericardium is not thickened.  There is moderate fat throughout the superior mediastinum.  In the visualized upper abdomen, spleen is enlarged, measuring 14.3 cm in length.  There are no blastic or lytic bone lesions.  IMPRESSION: Bilateral lower lobe consolidation, more on the right than on  the left.  No demonstrable pulmonary embolus.  Splenomegaly.   Original Report Authenticated By: Bretta Bang, M.D.    MDM   1. Community acquired pneumonia    Pt's plain film reviewed,  With apparent resolution of left consolidation. At presentation, pt tachycardic,  Given risk factors of dvt/pe including ocps in morbidly obese pt, Ct angio completed.  No PE, but bilateral lower consolidations, Pt has completed round of levaquin.  Per discussion with Dr. Estell Harpin, who also saw pt during visit, will tx with zithromax, first dose given here.  She is stable to be treated at home.  She had complaint of weakness prior to dc home.  Labs reviewed and stable. Blood pressure low,  But not with corresponding tachycardia with orthostatics, however was given IV fluids, also tolerated PO fluids without problem. Prior ed visits revealing for similar low bps. Advised f/u with pcp or return here for any worsened sx.  She was also given albuterol/atrovent neb while here, albuterol mdi for prn home use.    Burgess Amor, PA-C 07/25/13 2148

## 2013-07-27 NOTE — ED Provider Notes (Signed)
Medical screening examination/treatment/procedure(s) were conducted as a shared visit with non-physician practitioner(s) and myself.  I personally evaluated the patient during the encounter pe lungs clear,  Heart rrr.  Pt does not look toxic  [name], MD 07/27/13 [phone]

## 2013-08-07 ENCOUNTER — Ambulatory Visit (INDEPENDENT_AMBULATORY_CARE_PROVIDER_SITE_OTHER): Payer: Medicaid Other | Admitting: Gastroenterology

## 2013-08-07 ENCOUNTER — Encounter: Payer: Self-pay | Admitting: Gastroenterology

## 2013-08-07 VITALS — BP 117/73 | HR 90 | Temp 97.6°F | Ht 66.0 in | Wt 334.0 lb

## 2013-08-07 DIAGNOSIS — K219 Gastro-esophageal reflux disease without esophagitis: Secondary | ICD-10-CM

## 2013-08-07 DIAGNOSIS — K625 Hemorrhage of anus and rectum: Secondary | ICD-10-CM

## 2013-08-07 DIAGNOSIS — K5900 Constipation, unspecified: Secondary | ICD-10-CM

## 2013-08-07 MED ORDER — LUBIPROSTONE 24 MCG PO CAPS: 24.0000 ug | ORAL_CAPSULE | Freq: Two times a day (BID) | ORAL | Status: DC

## 2013-08-07 NOTE — Assessment & Plan Note (Signed)
Controlled with Protonix daily. Continue with PPI and lifestyle changes/weight loss. Congratulated on weight loss efforts thus far.

## 2013-08-07 NOTE — Patient Instructions (Addendum)
Congratulations on the weight loss!! KEEP UP THE GOOD WORK!  Start taking Amitiza 24 mcg, 1 capsule each evening with food for 3 days. If this does well, increase to twice a day. I have provided samples and sent this to your pharmacy.   We have scheduled you for banding of your hemorrhoids with Dr. Darrick Penna in the near future.

## 2013-08-07 NOTE — Assessment & Plan Note (Signed)
TCS on file: internal hemorrhoids. Bleeding in the setting of constipation. Increase Amitiza to 24 mcg BID and schedule for outpatient banding with Dr. Darrick Penna.

## 2013-08-07 NOTE — Assessment & Plan Note (Signed)
Persistent hard stool. Increase Amitiza to 24 mcg BID.

## 2013-08-07 NOTE — Progress Notes (Signed)
Referring Provider: Alice Reichert, MD Primary Care Physician:  Alice Reichert, MD Primary GI: Dr Darrick Penna   Chief Complaint  Patient presents with  . Follow-up    HPI:   [name] returns today in follow-up with a history of chronic constipation, remote PUD secondary to NSAID, and rectal bleeding. TCS/EGD on file with internal hemorrhoids and erosive esophagitis/gastritis. Benign path.  Lost almost 10 lbs since last visit and proud of this. Drinking water, changing dietary intake, walking daily.   Hard stool. Rectal bleeding with bowel movements. On Amitiza BID. No nausea. Abdominal discomfort, relieved after BM. Couldn't tolerate Linzess. Protonix daily controls GERD. Avoiding NSAIDs.   Past Medical History  Diagnosis Date  . Herpes   . GERD (gastroesophageal reflux disease)   . Gastritis   . Disk prolapse   . Bulging disc     Past Surgical History  Procedure Laterality Date  . Wisdom tooth extraction    . Esophagogastroduodenoscopy  01/19/2012    WUJ:WJXBJY,NWGNFAOZ IN THE ANTRUM/HIATAL HERNIA/  . Colonoscopy with esophagogastroduodenoscopy (egd) N/A 02/24/2013    HYQ:MVHQIONGEXBM RECTAL BLEEDING DUE TO Moderate sized internal hemorrhoids, EGD: erosive esophagitis due to uncontrolled reflux, moderate erosive gastritis, benign path    Current Outpatient Prescriptions  Medication Sig Dispense Refill  . albuterol (PROVENTIL HFA;VENTOLIN HFA) 108 (90 BASE) MCG/ACT inhaler Inhale 1-2 puffs into the lungs every 6 (six) hours as needed for wheezing.  1 Inhaler  0  . citalopram (CELEXA) 20 MG tablet Take 20 mg by mouth daily.      . DULoxetine (CYMBALTA) 60 MG capsule Take 60 mg by mouth at bedtime.      . hydrOXYzine (VISTARIL) 50 MG capsule Take 50 mg by mouth 3 (three) times daily as needed for itching.      Marland Kitchen LORazepam (ATIVAN) 1 MG tablet Take 1 mg by mouth 3 (three) times daily.       Marland Kitchen lubiprostone (AMITIZA) 8 MCG capsule Take 8 mcg by mouth 2 (two) times daily.       Marland Kitchen tiZANidine (ZANAFLEX) 2 MG tablet Take 2 mg by mouth at bedtime.       . topiramate (TOPAMAX) 100 MG tablet Take 100 mg by mouth daily.      . traMADol (ULTRAM) 50 MG tablet Take 1 tablet (50 mg total) by mouth every 6 (six) hours as needed for pain.  15 tablet  0  . traZODone (DESYREL) 100 MG tablet Take 200 mg by mouth at bedtime.       . valACYclovir (VALTREX) 1000 MG tablet Take 1,000 mg by mouth at bedtime.      Marland Kitchen azithromycin (ZITHROMAX Z-PAK) 250 MG tablet Take 1 tablet (250 mg total) by mouth daily.  4 tablet  0  . levofloxacin (LEVAQUIN) 750 MG tablet Take 1 tablet (750 mg total) by mouth daily. X 7 days  5 tablet  0   No current facility-administered medications for this visit.    Allergies as of 08/07/2013  . (No Known Allergies)    Family History  Problem Relation Age of Onset  . Diabetes Mother   . Diabetes Other   . Hypertension Other   . Colon cancer Neg Hx   . Colon polyps Neg Hx     History   Social History  . Marital Status: Single    Spouse Name: N/A    Number of Children: 1  . Years of Education: N/A   Occupational History  .    Marland Kitchen  Unemployed    Social History Main Topics  . Smoking status: Former Smoker -- 0.25 packs/day for .5 years    Types: Cigarettes    Quit date: 07/28/2011  . Smokeless tobacco: Never Used  . Alcohol Use: No  . Drug Use: No  . Sexual Activity: Yes    Birth Control/ Protection: None   Other Topics Concern  . None   Social History Narrative  . None    Review of Systems: Negative unless mentioned in HPI.   Physical Exam: BP 117/73  Pulse 90  Temp(Src) 97.6 F (36.4 C) (Oral)  Ht 5\' 6"  (1.676 m)  Wt 334 lb (151.501 kg)  BMI 53.93 kg/m2  LMP 07/13/2013 General:   Alert and oriented. No distress noted. Pleasant and cooperative.  Head:  Normocephalic and atraumatic. Eyes:  Conjuctiva clear without scleral icterus. Heart:  S1, S2 present without murmurs, rubs, or gallops. Regular rate and rhythm. Abdomen:   +BS, soft, non-tender and non-distended. No rebound or guarding. No HSM or masses noted. Msk:  Symmetrical without gross deformities. Normal posture. Extremities:  Without edema. Neurologic:  Alert and  oriented x4;  grossly normal neurologically. Skin:  Intact without significant lesions or rashes. Psych:  Alert and cooperative. Normal mood and affect.

## 2013-08-08 ENCOUNTER — Telehealth: Payer: Self-pay | Admitting: Gastroenterology

## 2013-08-08 ENCOUNTER — Encounter: Payer: Medicaid Other | Admitting: Gastroenterology

## 2013-08-08 NOTE — Progress Notes (Signed)
REVIEWED.  

## 2013-08-08 NOTE — Telephone Encounter (Signed)
Correction- I offered OV for banding on 9/24 with SF and then patient agreed to come today instead

## 2013-08-08 NOTE — Progress Notes (Signed)
CC'd to PCP 

## 2013-08-08 NOTE — Telephone Encounter (Signed)
REVIEWED.  

## 2013-08-08 NOTE — Telephone Encounter (Signed)
Pt was seen yesterday and I scheduled her a banding for 9/25 with SF. Pt later called back to see is we had anything later that day. I told her that I had a cancellation for 9/23 at 215 if she could come then and she agreed. Pt did not show for banding today and I tried calling her around 230pm to check on her, but I haven't heard from her.

## 2013-08-09 ENCOUNTER — Encounter: Payer: Medicaid Other | Admitting: Gastroenterology

## 2013-09-21 ENCOUNTER — Other Ambulatory Visit: Payer: Self-pay

## 2013-11-07 ENCOUNTER — Telehealth: Payer: Self-pay | Admitting: Gastroenterology

## 2013-11-07 MED ORDER — HYDROCORTISONE ACETATE 25 MG RE SUPP: 25.0000 mg | Freq: Two times a day (BID) | RECTAL | Status: DC

## 2013-11-07 NOTE — Telephone Encounter (Signed)
Pt was a no show for her banding on 9/24 with SF. Her mother calls today to [facility] banding ASAP because patient is still bleeding. I put patient of SF schedule for a banding on 1/8 at 1130 and mother said that she would make sure patient shows up this time. Patient and mother also asked if the nurse would call to advise what she can do in the meantime. [phone] or [phone]

## 2013-11-07 NOTE — Telephone Encounter (Signed)
PLEASE CALL PT.   DRINK WATER TO KEEP YOUR URINE LIGHT YELLOW. FOLLOW A HIGH FIBER DIET. SEE INFO BELOW. She may use Anusol HC supp bid for 12 days.

## 2013-11-13 NOTE — Telephone Encounter (Signed)
Called and informed pt. She said the Anusol is not covered by Medicaid and she needs something cheaper.

## 2013-11-13 NOTE — Telephone Encounter (Signed)
PLEASE CALL PHARMACY AND ASK IF THERE IS  A CHEAPER ALTERNATIVE FOR MEDICAID PTS. IF SO I WILL CALL IN RX.

## 2013-11-14 NOTE — Telephone Encounter (Signed)
Called pharmacy and they do not have a list. Per Raynelle Fanning, [place] will not pay for any suppositories, but should pay for PROCTOZONE CREAM.

## 2013-11-15 MED ORDER — HYDROCORTISONE 2.5 % RE CREA: 1.0000 "application " | TOPICAL_CREAM | Freq: Two times a day (BID) | RECTAL | Status: DC

## 2013-11-15 NOTE — Telephone Encounter (Signed)
PLEASE CALL PT. RX FOR PROCTOZONE SENT.

## 2013-11-15 NOTE — Telephone Encounter (Signed)
LMOM that Rx has been sent.  

## 2013-11-15 NOTE — Addendum Note (Signed)
Addended by: [place] on: 11/15/2013 12:27 PM   Modules accepted: Orders, Medications

## 2013-11-20 ENCOUNTER — Other Ambulatory Visit: Payer: Self-pay | Admitting: Gastroenterology

## 2013-11-23 ENCOUNTER — Encounter: Payer: Self-pay | Admitting: Gastroenterology

## 2013-11-23 ENCOUNTER — Ambulatory Visit (INDEPENDENT_AMBULATORY_CARE_PROVIDER_SITE_OTHER): Payer: Medicaid Other | Admitting: Gastroenterology

## 2013-11-23 VITALS — BP 126/74 | HR 78 | Temp 97.6°F | Wt 326.0 lb

## 2013-11-23 DIAGNOSIS — K648 Other hemorrhoids: Secondary | ICD-10-CM

## 2013-11-23 NOTE — Patient Instructions (Signed)
CONTINUE AMITIZA.  FOLLOW A HIGH FIBER DIET. AVOID ITEMS THAT CAUSE BLOATING AND GAS. FIND THE FIBER THAT WORKS FOR YOU. SEE INFO BELOW.  DRINK WATER TO KEEP YOUR URINE LIGHT YELLOW.  SIT FOR LESS THAN 5 MINUTES ON THE COMMODE.  FOLLOW UP IN 2-3 WEEKS.    High-Fiber Diet A high-fiber diet changes your normal diet to include more whole grains, legumes, fruits, and vegetables. Changes in the diet involve replacing refined carbohydrates with unrefined foods. The calorie level of the diet is essentially unchanged. The Dietary Reference Intake (recommended amount) for adult males is 38 grams per day. For adult females, it is 25 grams per day. Pregnant and lactating women should consume 28 grams of fiber per day. Fiber is the intact part of a plant that is not broken down during digestion. Functional fiber is fiber that has been isolated from the plant to provide a beneficial effect in the body. PURPOSE  Increase stool bulk.   Ease and regulate bowel movements.   Lower cholesterol.  INDICATIONS THAT YOU NEED MORE FIBER  Constipation and hemorrhoids.   Uncomplicated diverticulosis (intestine condition) and irritable bowel syndrome.   Weight management.   As a protective measure against hardening of the arteries (atherosclerosis), diabetes, and cancer.   DO NOT USE WITH:  Acute diverticulitis (intestine infection).   Partial small bowel obstructions.   Complicated diverticular disease involving bleeding, rupture (perforation), or abscess (boil, furuncle).   Presence of autonomic neuropathy (nerve damage) or gastroparesis (stomach cannot empty itself).    GUIDELINES FOR INCREASING FIBER IN THE DIET  Start adding fiber to the diet slowly. A gradual increase of about 5 more grams (2 slices of whole-wheat bread, 2 servings of most fruits or vegetables, or 1 bowl of high-fiber cereal) per day is best. Too rapid an increase in fiber may result in constipation, flatulence, and bloating.    Drink enough water and fluids to keep your urine clear or pale yellow. Water, juice, or caffeine-free drinks are recommended. Not drinking enough fluid may cause constipation.   Eat a variety of high-fiber foods rather than one type of fiber.   Try to increase your intake of fiber through using high-fiber foods rather than fiber pills or supplements that contain small amounts of fiber.   The goal is to change the types of food eaten. Do not supplement your present diet with high-fiber foods, but replace foods in your present diet.    INCLUDE A VARIETY OF FIBER SOURCES  Replace refined and processed grains with whole grains, canned fruits with fresh fruits, and incorporate other fiber sources. White rice, white breads, and most bakery goods contain little or no fiber.   Brown whole-grain rice, buckwheat oats, and many fruits and vegetables are all good sources of fiber. These include: broccoli, Brussels sprouts, cabbage, cauliflower, beets, sweet potatoes, white potatoes (skin on), carrots, tomatoes, eggplant, squash, berries, fresh fruits, and dried fruits.   Cereals appear to be the richest source of fiber. Cereal fiber is found in whole grains and bran. Bran is the fiber-rich outer coat of cereal grain, which is largely removed in refining. In whole-grain cereals, the bran remains. In breakfast cereals, the largest amount of fiber is found in those with "bran" in their names. The fiber content is sometimes indicated on the label.   You may need to include additional fruits and vegetables each day.   In baking, for 1 cup white flour, you may use the following substitutions:   1 cup  whole-wheat flour minus 2 tablespoons.   1/2 cup white flour plus 1/2 cup whole-wheat flour.

## 2013-11-23 NOTE — Progress Notes (Signed)
SYMPTOMS: RECTAL BLEEDING-every other time, and RECTAL PAIN-ONCE A DAY(MINS)   CONSTIPATION: NO DIARRHEA: NO  STRAINS WITH BMs: YES  TIME SPENT ON TOILET: 15 MINS TISSUE POKES OUT OF RECTUM: NO FIBER SUPPLEMENTS: NO  GLASSES OF WATER/DAY: 6-8: YES( 4 BOTTLES)   ADDITIONAL QUESTIONS:  LATEX ALLERGY: NO PREGNANT: NO ERECTILE DYSFUNCTION MEDS OR NITRATES: NO ANTICOAGULATION/ANTIPLATELET MEDS: NO DIAGNOSED WITH CROHN'S DISEASE, PROCTITIS, PORTAL HTN, OR ANAL/RECTAL CA: NO TAKING IMMUNOSUPPRESSANTS/XRT: NO  PLAN: 1. [facility] BANDING TODAY   PROCEDURE TECHNIQUE: BENEFITS RISK EXPLAINED TO PT. ANOSCOPY PERFORMED. BULGING INTERNAL HEMORRHOID COLUMN IN THE R POSTERIOR AND ANTERIOR COLUMNS. ONE [facility] BAND PLACED IN RIGHT POSTERIOR POSITION. POST-BANDING RECTAL EXAM REVEALED GOOD PLACEMENT. EXAM NON-TENDER.

## 2013-11-23 NOTE — Assessment & Plan Note (Signed)
R ANT BAND IN 2-3 WEEKS RECOMMEND FIBER POWDER IF PT NOT INCREASING FIBER IN HER DIET.

## 2013-11-27 ENCOUNTER — Encounter (HOSPITAL_COMMUNITY): Payer: Self-pay | Admitting: Emergency Medicine

## 2013-11-27 ENCOUNTER — Emergency Department (HOSPITAL_COMMUNITY)
Admission: EM | Admit: 2013-11-27 | Discharge: 2013-11-27 | Disposition: A | Discharge status: Home / Self Care | Payer: Medicaid Other | Attending: Emergency Medicine | Admitting: Emergency Medicine

## 2013-11-27 ENCOUNTER — Emergency Department (HOSPITAL_COMMUNITY): Payer: Medicaid Other

## 2013-11-27 DIAGNOSIS — R51 Headache: Secondary | ICD-10-CM | POA: Insufficient documentation

## 2013-11-27 DIAGNOSIS — R748 Abnormal levels of other serum enzymes: Secondary | ICD-10-CM

## 2013-11-27 DIAGNOSIS — Z3202 Encounter for pregnancy test, result negative: Secondary | ICD-10-CM | POA: Insufficient documentation

## 2013-11-27 DIAGNOSIS — Z8739 Personal history of other diseases of the musculoskeletal system and connective tissue: Secondary | ICD-10-CM | POA: Insufficient documentation

## 2013-11-27 DIAGNOSIS — Z79899 Other long term (current) drug therapy: Secondary | ICD-10-CM | POA: Insufficient documentation

## 2013-11-27 DIAGNOSIS — Z87891 Personal history of nicotine dependence: Secondary | ICD-10-CM | POA: Insufficient documentation

## 2013-11-27 DIAGNOSIS — K219 Gastro-esophageal reflux disease without esophagitis: Secondary | ICD-10-CM | POA: Insufficient documentation

## 2013-11-27 DIAGNOSIS — Z8619 Personal history of other infectious and parasitic diseases: Secondary | ICD-10-CM | POA: Insufficient documentation

## 2013-11-27 DIAGNOSIS — R197 Diarrhea, unspecified: Secondary | ICD-10-CM | POA: Insufficient documentation

## 2013-11-27 DIAGNOSIS — R112 Nausea with vomiting, unspecified: Secondary | ICD-10-CM | POA: Insufficient documentation

## 2013-11-27 DIAGNOSIS — N83209 Unspecified ovarian cyst, unspecified side: Secondary | ICD-10-CM | POA: Insufficient documentation

## 2013-11-27 LAB — URINALYSIS, ROUTINE W REFLEX MICROSCOPIC
GLUCOSE, UA: NEGATIVE mg/dL
HGB URINE DIPSTICK: NEGATIVE
Nitrite: NEGATIVE
PROTEIN: 30 mg/dL — AB
Specific Gravity, Urine: >1.03 — ABNORMAL HIGH (ref 1.005–1.030)
Urobilinogen, UA: 0.2 mg/dL (ref 0.0–1.0)
pH: 6 (ref 5.0–8.0)

## 2013-11-27 LAB — COMPREHENSIVE METABOLIC PANEL
ALBUMIN: 4.3 g/dL (ref 3.5–5.2)
ALK PHOS: 133 U/L — AB (ref 39–117)
ALT: 124 U/L — AB (ref 0–35)
AST: 150 U/L — AB (ref 0–37)
BILIRUBIN TOTAL: 0.8 mg/dL (ref 0.3–1.2)
BUN: 8 mg/dL (ref 6–23)
CHLORIDE: 103 meq/L (ref 96–112)
CO2: 24 mEq/L (ref 19–32)
Calcium: 10.2 mg/dL (ref 8.4–10.5)
Creatinine, Ser: 0.84 mg/dL (ref 0.50–1.10)
GFR calc Af Amer: >90 mL/min (ref >90)
GFR calc non Af Amer: >90 mL/min (ref >90)
Glucose, Bld: 108 mg/dL — ABNORMAL HIGH (ref 70–99)
POTASSIUM: 3.7 meq/L (ref 3.7–5.3)
SODIUM: 143 meq/L (ref 137–147)
Total Protein: 7.9 g/dL (ref 6.0–8.3)

## 2013-11-27 LAB — CBC WITH DIFFERENTIAL/PLATELET
BASOS PCT: 0 % (ref 0–1)
Basophils Absolute: 0 10*3/uL (ref 0.0–0.1)
Eosinophils Absolute: 0 10*3/uL (ref 0.0–0.7)
Eosinophils Relative: 0 % (ref 0–5)
HCT: 42 % (ref 36.0–46.0)
HEMOGLOBIN: 15.2 g/dL — AB (ref 12.0–15.0)
LYMPHS ABS: 1.5 10*3/uL (ref 0.7–4.0)
Lymphocytes Relative: 15 % (ref 12–46)
MCH: 31.3 pg (ref 26.0–34.0)
MCHC: 36.2 g/dL — AB (ref 30.0–36.0)
MCV: 86.6 fL (ref 78.0–100.0)
MONOS PCT: 7 % (ref 3–12)
Monocytes Absolute: 0.6 10*3/uL (ref 0.1–1.0)
NEUTROS ABS: 7.8 10*3/uL — AB (ref 1.7–7.7)
NEUTROS PCT: 78 % — AB (ref 43–77)
Platelets: 229 10*3/uL (ref 150–400)
RBC: 4.85 MIL/uL (ref 3.87–5.11)
RDW: 13.1 % (ref 11.5–15.5)
WBC: 9.9 10*3/uL (ref 4.0–10.5)

## 2013-11-27 LAB — URINE MICROSCOPIC-ADD ON

## 2013-11-27 LAB — PREGNANCY, URINE: Preg Test, Ur: NEGATIVE

## 2013-11-27 MED ORDER — SODIUM CHLORIDE 0.9 % IV BOLUS (SEPSIS)
1000.0000 mL | Freq: Once | INTRAVENOUS | Status: AC
  Administered 2013-11-27: 1000 mL via INTRAVENOUS

## 2013-11-27 MED ORDER — HYDROMORPHONE HCL PF 1 MG/ML IJ SOLN
1.0000 mg | Freq: Once | INTRAMUSCULAR | Status: AC
  Administered 2013-11-27: 1 mg via INTRAVENOUS
  Filled 2013-11-27: qty 1

## 2013-11-27 MED ORDER — ONDANSETRON HCL 4 MG/2ML IJ SOLN
4.0000 mg | Freq: Once | INTRAMUSCULAR | Status: AC
  Administered 2013-11-27: 4 mg via INTRAVENOUS
  Filled 2013-11-27: qty 2

## 2013-11-27 MED ORDER — IOHEXOL 300 MG/ML  SOLN
100.0000 mL | Freq: Once | INTRAMUSCULAR | Status: AC | PRN
  Administered 2013-11-27: 100 mL via INTRAVENOUS

## 2013-11-27 MED ORDER — HYDROCODONE-ACETAMINOPHEN 5-325 MG PO TABS: 1.0000 | ORAL_TABLET | Freq: Four times a day (QID) | ORAL | Status: DC | PRN

## 2013-11-27 MED ORDER — IOHEXOL 300 MG/ML  SOLN
50.0000 mL | Freq: Once | INTRAMUSCULAR | Status: AC | PRN
  Administered 2013-11-27: 50 mL via ORAL

## 2013-11-27 NOTE — ED Provider Notes (Signed)
CSN: [id number]     Arrival date & time 11/27/13  1228 History   First MD Initiated Contact with Patient 11/27/13 1318 This chart was scribed for [name], MD by Valera Castle, ED Scribe. This patient was seen in room APA11/APA11 and the patient's care was started at 1:20 PM.      Chief Complaint  Patient presents with  . Abdominal Pain    Patient is a 28 y.o. female presenting with abdominal pain. The history is provided by the patient. No language interpreter was used.  Abdominal Pain Pain location:  RLQ Duration: this AM after waking up. Timing:  Constant Progression:  Worsening Chronicity:  New Associated symptoms: diarrhea, nausea and vomiting   Associated symptoms: no chest pain, no cough, no fatigue and no hematuria    HPI Comments: [name] is a 28 y.o. female who presents to the Emergency Department complaining of right, lower abdominal pain, with associated vomiting, diarrhea, and severe headache, onset this morning after waking up. She denies h/o surgeries. She reports her LNMP was in 10/2013. She denies any other associated symptoms.   PCP - Alice Reichert, MD  Past Medical History  Diagnosis Date  . Herpes   . GERD (gastroesophageal reflux disease)   . Gastritis   . Disk prolapse   . Bulging disc    Past Surgical History  Procedure Laterality Date  . Wisdom tooth extraction    . Esophagogastroduodenoscopy  01/19/2012    WUJ:WJXBJY,NWGNFAOZ IN THE ANTRUM/HIATAL HERNIA/  . Colonoscopy with esophagogastroduodenoscopy (egd) N/A 02/24/2013    HYQ:MVHQIONGEXBM RECTAL BLEEDING DUE TO Moderate sized internal hemorrhoids, EGD: erosive esophagitis due to uncontrolled reflux, moderate erosive gastritis, benign path   Family History  Problem Relation Age of Onset  . Diabetes Mother   . Diabetes Other   . Hypertension Other   . Colon cancer Neg Hx   . Colon polyps Neg Hx    History  Substance Use Topics  . Smoking status: Former Smoker -- 0.25 packs/day  for .5 years    Types: Cigarettes    Quit date: 07/28/2011  . Smokeless tobacco: Never Used  . Alcohol Use: No   OB History   Grav Para Term Preterm Abortions TAB SAB Ect Mult Living   1 1 1       1      Review of Systems  Constitutional: Negative for appetite change and fatigue.  HENT: Negative for congestion, ear discharge and sinus pressure.   Eyes: Negative for discharge.  Respiratory: Negative for cough.   Cardiovascular: Negative for chest pain.  Gastrointestinal: Positive for nausea, vomiting, abdominal pain (RLQ) and diarrhea.  Genitourinary: Negative for frequency and hematuria.  Musculoskeletal: Negative for back pain.  Skin: Negative for rash.  Neurological: Positive for headaches. Negative for seizures.  Psychiatric/Behavioral: Negative for hallucinations.    Allergies  Review of patient's allergies indicates no known allergies.  Home Medications   Current Outpatient Rx  Name  Route  Sig  Dispense  Refill  . citalopram (CELEXA) 20 MG tablet   Oral   Take 20 mg by mouth daily.         . DULoxetine (CYMBALTA) 60 MG capsule   Oral   Take 60 mg by mouth at bedtime.         . hydrOXYzine (VISTARIL) 50 MG capsule   Oral   Take 50 mg by mouth 3 (three) times daily as needed for itching.         Marland Kitchen  lubiprostone (AMITIZA) 24 MCG capsule   Oral   Take 1 capsule (24 mcg total) by mouth 2 (two) times daily with a meal.   60 capsule   3   . pantoprazole (PROTONIX) 40 MG tablet      TAKE 1 TABLET (40 MG TOTAL) BY MOUTH 2 (TWO) TIMES DAILY.   60 tablet   3   . tiZANidine (ZANAFLEX) 2 MG tablet   Oral   Take 2 mg by mouth at bedtime.          . topiramate (TOPAMAX) 100 MG tablet   Oral   Take 100 mg by mouth daily.         . traMADol (ULTRAM) 50 MG tablet   Oral   Take 1 tablet (50 mg total) by mouth every 6 (six) hours as needed for pain.   15 tablet   0   . traZODone (DESYREL) 100 MG tablet   Oral   Take 200 mg by mouth at bedtime.           . valACYclovir (VALTREX) 1000 MG tablet   Oral   Take 1,000 mg by mouth at bedtime.          BP 126/76  Pulse 94  Temp(Src) 98.2 F (36.8 C) (Oral)  Resp 18  Ht 5\' 6"  (1.676 m)  Wt 320 lb (145.151 kg)  BMI 51.67 kg/m2  SpO2 99%  LMP 11/16/2013  Physical Exam  Constitutional: She is oriented to person, place, and time. She appears well-developed.  HENT:  Head: Normocephalic.  Eyes: Conjunctivae and EOM are normal. No scleral icterus.  Neck: Neck supple. No thyromegaly present.  Cardiovascular: Normal rate and regular rhythm.  Exam reveals no gallop and no friction rub.   No murmur heard. Pulmonary/Chest: No stridor. She has no wheezes. She has no rales. She exhibits no tenderness.  Abdominal: Soft. Bowel sounds are normal. She exhibits no distension. There is tenderness (moderate RLQ tenderness). There is no rebound and no guarding.  Musculoskeletal: Normal range of motion. She exhibits no edema.  Lymphadenopathy:    She has no cervical adenopathy.  Neurological: She is oriented to person, place, and time. She exhibits normal muscle tone. Coordination normal.  Skin: No rash noted. No erythema.  Psychiatric: She has a normal mood and affect. Her behavior is normal.    ED Course  Procedures (including critical care time)  DIAGNOSTIC STUDIES: Oxygen Saturation is 99% on room air, normal by my interpretation.    COORDINATION OF CARE: 1:23 PM-Discussed treatment plan with pt at bedside and pt agreed to plan.   Results for orders placed during the hospital encounter of 11/27/13  CBC WITH DIFFERENTIAL      Result Value Range   WBC 9.9  4.0 - 10.5 K/uL   RBC 4.85  3.87 - 5.11 MIL/uL   Hemoglobin 15.2 (*) 12.0 - 15.0 g/dL   HCT 16.1  09.6 - 04.5 %   MCV 86.6  78.0 - 100.0 fL   MCH 31.3  26.0 - 34.0 pg   MCHC 36.2 (*) 30.0 - 36.0 g/dL   RDW 40.9  81.1 - 91.4 %   Platelets 229  150 - 400 K/uL   Neutrophils Relative % 78 (*) 43 - 77 %   Neutro Abs 7.8 (*) 1.7 - 7.7  K/uL   Lymphocytes Relative 15  12 - 46 %   Lymphs Abs 1.5  0.7 - 4.0 K/uL   Monocytes Relative 7  3 -  12 %   Monocytes Absolute 0.6  0.1 - 1.0 K/uL   Eosinophils Relative 0  0 - 5 %   Eosinophils Absolute 0.0  0.0 - 0.7 K/uL   Basophils Relative 0  0 - 1 %   Basophils Absolute 0.0  0.0 - 0.1 K/uL  COMPREHENSIVE METABOLIC PANEL      Result Value Range   Sodium 143  137 - 147 mEq/L   Potassium 3.7  3.7 - 5.3 mEq/L   Chloride 103  96 - 112 mEq/L   CO2 24  19 - 32 mEq/L   Glucose, Bld 108 (*) 70 - 99 mg/dL   BUN 8  6 - 23 mg/dL   Creatinine, Ser 1.610.84  0.50 - 1.10 mg/dL   Calcium 09.610.2  8.4 - 04.510.5 mg/dL   Total Protein 7.9  6.0 - 8.3 g/dL   Albumin 4.3  3.5 - 5.2 g/dL   AST 409150 (*) 0 - 37 U/L   ALT 124 (*) 0 - 35 U/L   Alkaline Phosphatase 133 (*) 39 - 117 U/L   Total Bilirubin 0.8  0.3 - 1.2 mg/dL   GFR calc non Af Amer >90  >90 mL/min   GFR calc Af Amer >90  >90 mL/min   No results found.   EKG Interpretation   None      Meds ordered this encounter  Medications  . sodium chloride 0.9 % bolus 1,000 mL    Sig:   . HYDROmorphone (DILAUDID) injection 1 mg    Sig:   . ondansetron (ZOFRAN) injection 4 mg    Sig:     MDM  Ovarian cyst The chart was scribed for me under my direct supervision.  I personally performed the history, physical, and medical decision making and all procedures in the evaluation of this patient.[name].   Corona Popovich L Chandani Rogowski, MD 11/27/13 1910

## 2013-11-27 NOTE — ED Notes (Signed)
complai of n/v/d and headache

## 2013-11-27 NOTE — ED Notes (Signed)
Pt requesting more pain medication.  Notified edp.  Pt reports pain 8/10.

## 2013-11-27 NOTE — Discharge Instructions (Signed)
Follow up with your md in 3-4 days for recheck °

## 2013-11-28 LAB — URINE CULTURE
Colony Count: NO GROWTH
Culture: NO GROWTH

## 2013-12-01 ENCOUNTER — Encounter: Payer: Self-pay | Admitting: Adult Health

## 2013-12-01 ENCOUNTER — Ambulatory Visit (INDEPENDENT_AMBULATORY_CARE_PROVIDER_SITE_OTHER): Payer: Medicaid Other | Admitting: Adult Health

## 2013-12-01 ENCOUNTER — Other Ambulatory Visit: Payer: Self-pay | Admitting: Adult Health

## 2013-12-01 VITALS — BP 100/60 | Ht 66.0 in | Wt 327.0 lb

## 2013-12-01 DIAGNOSIS — N949 Unspecified condition associated with female genital organs and menstrual cycle: Secondary | ICD-10-CM

## 2013-12-01 DIAGNOSIS — N83209 Unspecified ovarian cyst, unspecified side: Secondary | ICD-10-CM

## 2013-12-01 DIAGNOSIS — R748 Abnormal levels of other serum enzymes: Secondary | ICD-10-CM

## 2013-12-01 HISTORY — DX: Abnormal levels of other serum enzymes: R74.8

## 2013-12-01 HISTORY — DX: Unspecified ovarian cyst, unspecified side: N83.209

## 2013-12-01 NOTE — Patient Instructions (Signed)
Follow up in 5 days  For [place] and cmp and see me increase fluids Use heating pad Ovarian Cyst An ovarian cyst is a fluid-filled sac that forms on an ovary. The ovaries are small organs that produce eggs in women. Various types of cysts can form on the ovaries. Most are not cancerous. Many do not cause problems, and they often go away on their own. Some may cause symptoms and require treatment. Common types of ovarian cysts include:  Functional cysts These cysts may occur every month during the menstrual cycle. This is normal. The cysts usually go away with the next menstrual cycle if the woman does not get pregnant. Usually, there are no symptoms with a functional cyst.  Endometrioma cysts These cysts form from the tissue that lines the uterus. They are also called "chocolate cysts" because they become filled with blood that turns brown. This type of cyst can cause pain in the lower abdomen during intercourse and with your menstrual period.  Cystadenoma cysts This type develops from the cells on the outside of the ovary. These cysts can get very big and cause lower abdomen pain and pain with intercourse. This type of cyst can twist on itself, cut off its blood supply, and cause severe pain. It can also easily rupture and cause a lot of pain.  Dermoid cysts This type of cyst is sometimes found in both ovaries. These cysts may contain different kinds of body tissue, such as skin, teeth, hair, or cartilage. They usually do not cause symptoms unless they get very big.  Theca lutein cysts These cysts occur when too much of a certain hormone (human chorionic gonadotropin) is produced and overstimulates the ovaries to produce an egg. This is most common after procedures used to assist with the conception of a baby (in vitro fertilization). CAUSES   Fertility drugs can cause a condition in which multiple large cysts are formed on the ovaries. This is called ovarian hyperstimulation syndrome.  A condition  called polycystic ovary syndrome can cause hormonal imbalances that can lead to nonfunctional ovarian cysts. SIGNS AND SYMPTOMS  Many ovarian cysts do not cause symptoms. If symptoms are present, they may include:  Pelvic pain or pressure.  Pain in the lower abdomen.  Pain during sexual intercourse.  Increasing girth (swelling) of the abdomen.  Abnormal menstrual periods.  Increasing pain with menstrual periods.  Stopping having menstrual periods without being pregnant. DIAGNOSIS  These cysts are commonly found during a routine or annual pelvic exam. Tests may be ordered to find out more about the cyst. These tests may include:  Ultrasound.  X-ray of the pelvis.  CT scan.  MRI.  Blood tests. TREATMENT  Many ovarian cysts go away on their own without treatment. Your health care provider may want to check your cyst regularly for 2 3 months to see if it changes. For women in menopause, it is particularly important to monitor a cyst closely because of the higher rate of ovarian cancer in menopausal women. When treatment is needed, it may include any of the following:  A procedure to drain the cyst (aspiration). This may be done using a long needle and ultrasound. It can also be done through a laparoscopic procedure. This involves using a thin, lighted tube with a tiny camera on the end (laparoscope) inserted through a small incision.  Surgery to remove the whole cyst. This may be done using laparoscopic surgery or an open surgery involving a larger incision in the lower abdomen.  Hormone treatment or birth control pills. These methods are sometimes used to help dissolve a cyst. HOME CARE INSTRUCTIONS   Only take over-the-counter or prescription medicines as directed by your health care provider.  Follow up with your health care provider as directed.  Get regular pelvic exams and Pap tests. SEEK MEDICAL CARE IF:   Your periods are late, irregular, or painful, or they  stop.  Your pelvic pain or abdominal pain does not go away.  Your abdomen becomes larger or swollen.  You have pressure on your bladder or trouble emptying your bladder completely.  You have pain during sexual intercourse.  You have feelings of fullness, pressure, or discomfort in your stomach.  You lose weight for no apparent reason.  You feel generally ill.  You become constipated.  You lose your appetite.  You develop acne.  You have an increase in body and facial hair.  You are gaining weight, without changing your exercise and eating habits.  You think you are pregnant. SEEK IMMEDIATE MEDICAL CARE IF:   You have increasing abdominal pain.  You feel sick to your stomach (nauseous), and you throw up (vomit).  You develop a fever that comes on suddenly.  You have abdominal pain during a bowel movement.  Your menstrual periods become heavier than usual. Document Released: 11/02/2005 Document Revised: 08/23/2013 Document Reviewed: 07/10/2013 [facility] Patient Information 2014 [place], [place].

## 2013-12-01 NOTE — Progress Notes (Signed)
Subjective:     Patient ID: [name] A Harrington, female   DOB: 1986-04-21, 28 y.o.   MRN: [id number]  HPI [name] is a 28 year old white female who was seen in the ER 11/27/13 for abdominal pain and vomiting and diarrhea.She had a CT that showed right ovarian cyst and labs that showed elevated liver enzymes.She still has some pain RLQ and pees small amounts.Denies alcohol use or increased use of tylenol or advil.   Review of Systems See HPI Reviewed past medical,surgical, social and family history. Reviewed medications and allergies.     Objective:   Physical Exam BP 100/60  Ht 5\' 6"  (1.676 m)  Wt 327 lb (148.326 kg)  BMI 52.80 kg/m2  LMP 11/16/2013   Skin warm and dry.Pelvic: external genitalia is normal in appearance, vagina: scant discharge without odor, cervix:smooth and bulbous, uterus: normal size, shape and contour, non tender, no masses felt, adnexa: no masses has tenderness noted RLQ.No RUQ tenderness. Reviewed CT and labs(AST 150,ALT 124) with her and her Mom.  Assessment:     Right ovarian cyst Elevated liver enzymes    Plan:     Try not to use tylenol   Increase water  Decrease caffeine Use heating pad if needed Return in 5 days for US and will get CMP and hepatis panel then  and see me Review handout on ovarian cyst

## 2013-12-05 ENCOUNTER — Ambulatory Visit (INDEPENDENT_AMBULATORY_CARE_PROVIDER_SITE_OTHER): Payer: Medicaid Other

## 2013-12-05 ENCOUNTER — Encounter: Payer: Self-pay | Admitting: Adult Health

## 2013-12-05 ENCOUNTER — Ambulatory Visit (INDEPENDENT_AMBULATORY_CARE_PROVIDER_SITE_OTHER): Payer: Medicaid Other | Admitting: Adult Health

## 2013-12-05 VITALS — BP 120/70 | Ht 66.0 in | Wt 323.0 lb

## 2013-12-05 DIAGNOSIS — N83209 Unspecified ovarian cyst, unspecified side: Secondary | ICD-10-CM

## 2013-12-05 DIAGNOSIS — N949 Unspecified condition associated with female genital organs and menstrual cycle: Secondary | ICD-10-CM

## 2013-12-05 DIAGNOSIS — R748 Abnormal levels of other serum enzymes: Secondary | ICD-10-CM

## 2013-12-05 MED ORDER — NORGESTIMATE-ETH ESTRADIOL 0.25-35 MG-MCG PO TABS: 1.0000 | ORAL_TABLET | Freq: Every day | ORAL | Status: DC

## 2013-12-05 MED ORDER — HYDROCODONE-ACETAMINOPHEN 5-325 MG PO TABS: 1.0000 | ORAL_TABLET | Freq: Four times a day (QID) | ORAL | Status: DC | PRN

## 2013-12-05 NOTE — Patient Instructions (Signed)
Ovarian Cyst An ovarian cyst is a fluid-filled sac that forms on an ovary. The ovaries are small organs that produce eggs in women. Various types of cysts can form on the ovaries. Most are not cancerous. Many do not cause problems, and they often go away on their own. Some may cause symptoms and require treatment. Common types of ovarian cysts include:  Functional cysts These cysts may occur every month during the menstrual cycle. This is normal. The cysts usually go away with the next menstrual cycle if the woman does not get pregnant. Usually, there are no symptoms with a functional cyst.  Endometrioma cysts These cysts form from the tissue that lines the uterus. They are also called "chocolate cysts" because they become filled with blood that turns brown. This type of cyst can cause pain in the lower abdomen during intercourse and with your menstrual period.  Cystadenoma cysts This type develops from the cells on the outside of the ovary. These cysts can get very big and cause lower abdomen pain and pain with intercourse. This type of cyst can twist on itself, cut off its blood supply, and cause severe pain. It can also easily rupture and cause a lot of pain.  Dermoid cysts This type of cyst is sometimes found in both ovaries. These cysts may contain different kinds of body tissue, such as skin, teeth, hair, or cartilage. They usually do not cause symptoms unless they get very big.  Theca lutein cysts These cysts occur when too much of a certain hormone (human chorionic gonadotropin) is produced and overstimulates the ovaries to produce an egg. This is most common after procedures used to assist with the conception of a baby (in vitro fertilization). CAUSES   Fertility drugs can cause a condition in which multiple large cysts are formed on the ovaries. This is called ovarian hyperstimulation syndrome.  A condition called polycystic ovary syndrome can cause hormonal imbalances that can lead to  nonfunctional ovarian cysts. SIGNS AND SYMPTOMS  Many ovarian cysts do not cause symptoms. If symptoms are present, they may include:  Pelvic pain or pressure.  Pain in the lower abdomen.  Pain during sexual intercourse.  Increasing girth (swelling) of the abdomen.  Abnormal menstrual periods.  Increasing pain with menstrual periods.  Stopping having menstrual periods without being pregnant. DIAGNOSIS  These cysts are commonly found during a routine or annual pelvic exam. Tests may be ordered to find out more about the cyst. These tests may include:  Ultrasound.  X-ray of the pelvis.  CT scan.  MRI.  Blood tests. TREATMENT  Many ovarian cysts go away on their own without treatment. Your health care provider may want to check your cyst regularly for 2 3 months to see if it changes. For women in menopause, it is particularly important to monitor a cyst closely because of the higher rate of ovarian cancer in menopausal women. When treatment is needed, it may include any of the following:  A procedure to drain the cyst (aspiration). This may be done using a long needle and ultrasound. It can also be done through a laparoscopic procedure. This involves using a thin, lighted tube with a tiny camera on the end (laparoscope) inserted through a small incision.  Surgery to remove the whole cyst. This may be done using laparoscopic surgery or an open surgery involving a larger incision in the lower abdomen.  Hormone treatment or birth control pills. These methods are sometimes used to help dissolve a cyst. HOME CARE   INSTRUCTIONS   Only take over-the-counter or prescription medicines as directed by your health care provider.  Follow up with your health care provider as directed.  Get regular pelvic exams and Pap tests. SEEK MEDICAL CARE IF:   Your periods are late, irregular, or painful, or they stop.  Your pelvic pain or abdominal pain does not go away.  Your abdomen becomes  larger or swollen.  You have pressure on your bladder or trouble emptying your bladder completely.  You have pain during sexual intercourse.  You have feelings of fullness, pressure, or discomfort in your stomach.  You lose weight for no apparent reason.  You feel generally ill.  You become constipated.  You lose your appetite.  You develop acne.  You have an increase in body and facial hair.  You are gaining weight, without changing your exercise and eating habits.  You think you are pregnant. SEEK IMMEDIATE MEDICAL CARE IF:   You have increasing abdominal pain.  You feel sick to your stomach (nauseous), and you throw up (vomit).  You develop a fever that comes on suddenly.  You have abdominal pain during a bowel movement.  Your menstrual periods become heavier than usual. Document Released: 11/02/2005 Document Revised: 08/23/2013 Document Reviewed: 07/10/2013 [facility] Patient Information 2014 [place], [place]. Start sprintec today Follow up in 6 weeks for [place]

## 2013-12-05 NOTE — Progress Notes (Signed)
Subjective:     Patient ID: [name], female   DOB: [birth year], 28 y.o.   MRN: [id number]  HPI [name] is a 28 year old white female in for a US for ovarian cyst seen on CT in ER, still has pain and feel like can't empty bladder.  Review of Systems See HPI Reviewed past medical,surgical, social and family history. Reviewed medications and allergies.     Objective:   Physical Exam BP 120/70  Ht 5\' 6"  (1.676 m)  Wt 323 lb (146.512 kg)  BMI 52.16 kg/m2  LMP 01/01/2015UPT negative   Reviewed US with pt and her Mom,Uterus 6.1 x 4.8 x 3.6 cm, retroverted uterus no myometrial masses noted within  Endometrium 2.0 mm, symmetrical,  Right ovary 4.2 x 3.9 x 3.4 cm, with 3.6 x 2.6 x 2.4cm simple cyst noted  Left ovary 2.1 x 1.3 x 1.1 cm,  No free fluid noted within pelvis  Technician Comments:  Retroverted uterus noted, endom thin, symmetrical (2.620mm), Rt ovary with simple 3.6 x 2.6 x 2.4cm cyst noted, lt ovary appears wnl, no free fluid noted within pelvis.  Had good blood flow to ovary per Tasha. Discussed with Dr Emelda FearFerguson and will try suppression with OCs. Bladder was empty on US, told her this feeling may be due to cymbalta.  Assessment:    Right ovarian cyst, with pain Elevated liver enzymes    Plan:     Rx sprintec disp 1 pack take 1 daily with 11 refills   Rx norco 5/325 mg #40 1 every 6 hours prn pain no refills Check CMP and hepatitis panel, Follow up in 6 weeks for US and see me

## 2013-12-06 ENCOUNTER — Telehealth: Payer: Self-pay | Admitting: Adult Health

## 2013-12-06 LAB — COMPREHENSIVE METABOLIC PANEL
ALBUMIN: 4.2 g/dL (ref 3.5–5.2)
ALK PHOS: 112 U/L (ref 39–117)
ALT: 29 U/L (ref 0–35)
AST: 45 U/L — ABNORMAL HIGH (ref 0–37)
BUN: 17 mg/dL (ref 6–23)
CHLORIDE: 109 meq/L (ref 96–112)
CO2: 20 mEq/L (ref 19–32)
Calcium: 9 mg/dL (ref 8.4–10.5)
Creat: 0.88 mg/dL (ref 0.50–1.10)
GLUCOSE: 102 mg/dL — AB (ref 70–99)
POTASSIUM: 4 meq/L (ref 3.5–5.3)
Sodium: 139 mEq/L (ref 135–145)
Total Bilirubin: 0.4 mg/dL (ref 0.3–1.2)
Total Protein: 6.4 g/dL (ref 6.0–8.3)

## 2013-12-06 LAB — HEPATITIS PANEL, ACUTE
HCV Ab: NEGATIVE
Hep A IgM: NONREACTIVE
Hep B C IgM: NONREACTIVE
Hepatitis B Surface Ag: NEGATIVE

## 2013-12-06 NOTE — Telephone Encounter (Signed)
Left message that hepatitis panel negative and LFTs much better, avoid lots of Advil and tylenol, F/U as scheduled, call prn

## 2013-12-14 ENCOUNTER — Encounter: Payer: Self-pay | Admitting: Internal Medicine

## 2013-12-14 ENCOUNTER — Encounter: Payer: Medicaid Other | Admitting: Gastroenterology

## 2013-12-14 ENCOUNTER — Ambulatory Visit (INDEPENDENT_AMBULATORY_CARE_PROVIDER_SITE_OTHER): Payer: Medicaid Other | Admitting: Internal Medicine

## 2013-12-14 VITALS — BP 121/71 | HR 99 | Temp 97.4°F | Wt 321.6 lb

## 2013-12-14 DIAGNOSIS — K648 Other hemorrhoids: Secondary | ICD-10-CM

## 2013-12-14 NOTE — Progress Notes (Signed)
[facility] banding procedure note:  The patient presents with symptomatic hemorrhoids. Had right posterior column banded a couple of weeks ago. Reports significant pain for a few days after this maneuver. Pain has subsided. Rectal bleeding has improved. Based on anoscopy and prior recommendations, the right anterior column will be banded today. This approach was discussed with the patient. [facility] was used to perform band ligation without complication. Digital anorectal examination was then performed to assure proper positioning of the band and to adjust the banded tissue as required. The Band was found to be in excellent position. There was no pinching or discomfort whatsoever after this maneuver. The patient tolerated this procedure very well.  No complications were encountered.

## 2013-12-14 NOTE — Patient Instructions (Signed)
Avoid straining.  Benefiber 2 teaspoons twice daily  Limit toilet time to 2-3 minutes  Call with any interim problems  Schedule followup appointment in 2-3 weeks from now   

## 2013-12-28 ENCOUNTER — Telehealth: Payer: Self-pay | Admitting: Adult Health

## 2013-12-28 NOTE — Telephone Encounter (Signed)
Pt given sprintec and hydrocodone but states that the medication is not working. Pt been taking for about 4 weeks. Pt states that she has not had a period yet either. Spoke with JAG and she said that the pt would have to see Emelda FearFerguson to try and fix the problem.   Appointment made for the 17th.

## 2014-01-02 ENCOUNTER — Ambulatory Visit: Payer: Medicaid Other | Admitting: Obstetrics and Gynecology

## 2014-01-03 ENCOUNTER — Ambulatory Visit (INDEPENDENT_AMBULATORY_CARE_PROVIDER_SITE_OTHER): Payer: Medicaid Other | Admitting: Obstetrics and Gynecology

## 2014-01-03 ENCOUNTER — Encounter: Payer: Self-pay | Admitting: Obstetrics and Gynecology

## 2014-01-03 ENCOUNTER — Encounter: Payer: Medicaid Other | Admitting: Gastroenterology

## 2014-01-03 VITALS — BP 120/82 | Ht 66.0 in | Wt 321.0 lb

## 2014-01-03 DIAGNOSIS — N83209 Unspecified ovarian cyst, unspecified side: Secondary | ICD-10-CM

## 2014-01-03 DIAGNOSIS — N83201 Unspecified ovarian cyst, right side: Secondary | ICD-10-CM

## 2014-01-03 MED ORDER — HYDROCODONE-ACETAMINOPHEN 5-325 MG PO TABS: 1.0000 | ORAL_TABLET | Freq: Four times a day (QID) | ORAL | Status: DC | PRN

## 2014-01-03 MED ORDER — NORETHINDRONE-ETH ESTRADIOL 1-50 MG-MCG PO TABS: 1.0000 | ORAL_TABLET | Freq: Every day | ORAL | Status: DC

## 2014-01-03 NOTE — Progress Notes (Signed)
This chart was scribed by Bennett Scrapehristina Taylor, Medical Scribe, for Dr. Christin BachJohn Daiel Strohecker on 01/03/14 at 4:00 PM. This chart was reviewed by Dr. Christin BachJohn Nolyn Eilert and is accurate.     [facility] Visit  Patient name: [name] MRN [id number]  Date of birth: 1986/01/09  CC & HPI:  [name] is a [age] y.o. female presenting today for right suprapubic pain related to uncontrolled right ovarian cyst pain that has been going on "for a while". Pt was diagnosed with a 3.6 x 2.6 x 2.4cm simple cyst to the right ovary on 12/05/13. She was strated on 5-325 mg Norco with no improvement. She is also on Sprintec for menses control, she denies using it for sexual activity. Currently she is on the last week of the month.    ROS:  + right suprapubic pain Denies any other symptoms  Pertinent History Reviewed:  Medical & Surgical Hx:  Reviewed: Significant for right ovarian cyst Medications: Reviewed & Updated - see associated section Social History: Reviewed -  reports that she quit smoking about 2 years ago. Her smoking use included Cigarettes. She has a .125 pack-year smoking history. She has never used smokeless tobacco.  Objective Findings:  Vitals: BP 120/82  Ht 5\' 6"  (1.676 m)  Wt 321 lb (145.605 kg)  BMI 51.84 kg/m2  LMP 12/31/2013 Chaperone present for the exam. Exam performed with pt's permission with little discomfort and no complications.  Physical Examination: General appearance - alert, well appearing, and in no distress and oriented to person, place, and time Mental status - alert, oriented to person, place, and time, normal mood, behavior, speech, dress, motor activity, and thought processes Pelvic -Cervix is non-purulent with menstrual blood, negative CMT. Right adenxa is minimally tender. Left adenxa is non-tender Per US done on 12/05/13, 3.6 x 2.6 x 2.4cm simple cyst noted on right ovary  Assessment & Plan:  Assessment is right ovarian cyst pain Plan: stronger birth  control pill refil HC 5/325

## 2014-01-03 NOTE — Patient Instructions (Signed)
Ovarian Cyst °An ovarian cyst is a sac filled with fluid or blood. This sac is attached to the ovary. Some cysts go away on their own. Other cysts need treatment.  °HOME CARE  °· Only take medicine as told by your doctor. °· Follow up with your doctor as told. °· Get regular pelvic exams and Pap tests. °GET HELP IF: °· Your periods are late, not regular, or painful. °· You stop having periods. °· Your belly (abdominal) or pelvic pain does not go away. °· Your belly becomes large or puffy (swollen). °· You have a hard time peeing (totally emptying your bladder). °· You have pressure on your bladder. °· You have pain during sex. °· You feel fullness, pressure, or discomfort in your belly. °· You lose weight for no reason. °· You feel sick most of the time. °· You have a hard time pooping (constipation). °· You do not feel like eating. °· You develop pimples (acne). °· You have an increase in hair on your body and face. °· You are gaining weight for no reason. °· You think you are pregnant. °GET HELP RIGHT AWAY IF:  °· Your belly pain gets worse. °· You feel sick to your stomach (nauseous), and you throw up (vomit). °· You have a fever that comes on fast. °· You have belly pain while pooping (bowel movement). °· Your periods are heavier than usual. °MAKE SURE YOU:  °· Understand these instructions. °· Will watch your condition. °· Will get help right away if you are not doing well or get worse. °Document Released: 04/20/2008 Document Revised: 08/23/2013 Document Reviewed: 07/10/2013 °Exit[employer]® Patient Information ©2014 [employer]. ° °

## 2014-01-16 ENCOUNTER — Other Ambulatory Visit: Payer: Medicaid Other

## 2014-01-16 ENCOUNTER — Ambulatory Visit: Payer: Medicaid Other | Admitting: Adult Health

## 2014-01-25 ENCOUNTER — Encounter: Payer: Medicaid Other | Admitting: Gastroenterology

## 2014-01-25 ENCOUNTER — Telehealth: Payer: Self-pay | Admitting: Gastroenterology

## 2014-01-25 NOTE — Telephone Encounter (Signed)
REVIEWED.  

## 2014-01-25 NOTE — Telephone Encounter (Signed)
Pt was a no show

## 2014-01-31 ENCOUNTER — Telehealth: Payer: Self-pay

## 2014-01-31 NOTE — Telephone Encounter (Signed)
Pt requesting refill on pain medication for abdominal pain from ovarian cyst. Per Dr. Emelda FearFerguson, pt will need an appt. Next available appointment made for next Tuesday, February 06, 2013 with Dr. Emelda FearFerguson per front staff.

## 2014-02-06 ENCOUNTER — Encounter: Payer: Self-pay | Admitting: Obstetrics and Gynecology

## 2014-02-06 ENCOUNTER — Ambulatory Visit (INDEPENDENT_AMBULATORY_CARE_PROVIDER_SITE_OTHER): Payer: Medicaid Other | Admitting: Obstetrics and Gynecology

## 2014-02-06 VITALS — BP 132/70 | Ht 66.0 in | Wt 315.8 lb

## 2014-02-06 DIAGNOSIS — G8929 Other chronic pain: Secondary | ICD-10-CM

## 2014-02-06 DIAGNOSIS — R1031 Right lower quadrant pain: Secondary | ICD-10-CM

## 2014-02-06 NOTE — Patient Instructions (Signed)
Aleve or motrin as needed for pain Weight loss encouraged  , it will help

## 2014-02-06 NOTE — Progress Notes (Signed)
Subjective:     Patient ID: [name], female   DOB: June 04, 1986, 28 y.o.   MRN: [id number]  HPI BP 132/70  Ht 5\' 6"  (1.676 m)  Wt 143.246 kg (315 lb 12.8 oz)  BMI 51.00 kg/m2  LMP 02/03/2014 This obese female G1P1001 now on menses , on OCP, has rlq pain without fever chills or N,V.   Review of Systems     Objective:   Physical Exam    pain is musculoskeletal , reproducible with abd wall tight from a "crunch" Pelvic exam not as tender , including a vaginal u/s to rule out adnexal pathololgy Assessment:     Musculoskeletal discomfort.     Plan:     Weight loss Activity Recheck prn

## 2014-02-14 ENCOUNTER — Ambulatory Visit: Payer: Medicaid Other | Admitting: Obstetrics and Gynecology

## 2014-02-14 ENCOUNTER — Other Ambulatory Visit: Payer: Medicaid Other

## 2014-03-24 ENCOUNTER — Other Ambulatory Visit: Payer: Self-pay | Admitting: Gastroenterology

## 2014-04-12 ENCOUNTER — Other Ambulatory Visit (HOSPITAL_COMMUNITY): Payer: Self-pay | Admitting: Family Medicine

## 2014-04-12 ENCOUNTER — Ambulatory Visit (HOSPITAL_COMMUNITY)
Admission: RE | Admit: 2014-04-12 | Discharge: 2014-04-12 | Disposition: A | Discharge status: Home / Self Care | Payer: Medicaid Other | Source: Ambulatory Visit | Attending: Family Medicine | Admitting: Family Medicine

## 2014-04-12 DIAGNOSIS — M7730 Calcaneal spur, unspecified foot: Secondary | ICD-10-CM | POA: Insufficient documentation

## 2014-04-12 DIAGNOSIS — M79672 Pain in left foot: Secondary | ICD-10-CM

## 2014-04-12 DIAGNOSIS — R52 Pain, unspecified: Secondary | ICD-10-CM

## 2014-06-05 ENCOUNTER — Encounter (HOSPITAL_COMMUNITY): Payer: Self-pay | Admitting: Emergency Medicine

## 2014-06-05 ENCOUNTER — Emergency Department (HOSPITAL_COMMUNITY): Payer: Medicaid Other

## 2014-06-05 ENCOUNTER — Emergency Department (HOSPITAL_COMMUNITY)
Admission: EM | Admit: 2014-06-05 | Discharge: 2014-06-05 | Disposition: A | Discharge status: Home / Self Care | Payer: Medicaid Other | Attending: Emergency Medicine | Admitting: Emergency Medicine

## 2014-06-05 DIAGNOSIS — N390 Urinary tract infection, site not specified: Secondary | ICD-10-CM | POA: Insufficient documentation

## 2014-06-05 DIAGNOSIS — G8929 Other chronic pain: Secondary | ICD-10-CM | POA: Insufficient documentation

## 2014-06-05 DIAGNOSIS — R197 Diarrhea, unspecified: Secondary | ICD-10-CM | POA: Insufficient documentation

## 2014-06-05 DIAGNOSIS — Z8719 Personal history of other diseases of the digestive system: Secondary | ICD-10-CM | POA: Insufficient documentation

## 2014-06-05 DIAGNOSIS — R1012 Left upper quadrant pain: Secondary | ICD-10-CM | POA: Diagnosis not present

## 2014-06-05 DIAGNOSIS — R51 Headache: Secondary | ICD-10-CM | POA: Diagnosis not present

## 2014-06-05 DIAGNOSIS — J3489 Other specified disorders of nose and nasal sinuses: Secondary | ICD-10-CM | POA: Diagnosis not present

## 2014-06-05 DIAGNOSIS — R6883 Chills (without fever): Secondary | ICD-10-CM | POA: Diagnosis not present

## 2014-06-05 DIAGNOSIS — Z8619 Personal history of other infectious and parasitic diseases: Secondary | ICD-10-CM | POA: Diagnosis not present

## 2014-06-05 DIAGNOSIS — Z3202 Encounter for pregnancy test, result negative: Secondary | ICD-10-CM | POA: Insufficient documentation

## 2014-06-05 DIAGNOSIS — R110 Nausea: Secondary | ICD-10-CM | POA: Insufficient documentation

## 2014-06-05 DIAGNOSIS — R1013 Epigastric pain: Secondary | ICD-10-CM | POA: Diagnosis present

## 2014-06-05 DIAGNOSIS — Z9889 Other specified postprocedural states: Secondary | ICD-10-CM | POA: Insufficient documentation

## 2014-06-05 DIAGNOSIS — E669 Obesity, unspecified: Secondary | ICD-10-CM | POA: Insufficient documentation

## 2014-06-05 DIAGNOSIS — R1011 Right upper quadrant pain: Secondary | ICD-10-CM | POA: Diagnosis not present

## 2014-06-05 DIAGNOSIS — Z8742 Personal history of other diseases of the female genital tract: Secondary | ICD-10-CM | POA: Diagnosis not present

## 2014-06-05 DIAGNOSIS — Z79899 Other long term (current) drug therapy: Secondary | ICD-10-CM | POA: Insufficient documentation

## 2014-06-05 DIAGNOSIS — Z8739 Personal history of other diseases of the musculoskeletal system and connective tissue: Secondary | ICD-10-CM | POA: Diagnosis not present

## 2014-06-05 DIAGNOSIS — F411 Generalized anxiety disorder: Secondary | ICD-10-CM | POA: Insufficient documentation

## 2014-06-05 DIAGNOSIS — Z87891 Personal history of nicotine dependence: Secondary | ICD-10-CM | POA: Insufficient documentation

## 2014-06-05 LAB — CBC WITH DIFFERENTIAL/PLATELET
BASOS ABS: 0 10*3/uL (ref 0.0–0.1)
Basophils Relative: 0 % (ref 0–1)
EOS PCT: 1 % (ref 0–5)
Eosinophils Absolute: 0.1 10*3/uL (ref 0.0–0.7)
HEMATOCRIT: 37.6 % (ref 36.0–46.0)
Hemoglobin: 13 g/dL (ref 12.0–15.0)
LYMPHS PCT: 24 % (ref 12–46)
Lymphs Abs: 1.7 10*3/uL (ref 0.7–4.0)
MCH: 31.3 pg (ref 26.0–34.0)
MCHC: 34.6 g/dL (ref 30.0–36.0)
MCV: 90.6 fL (ref 78.0–100.0)
MONO ABS: 0.5 10*3/uL (ref 0.1–1.0)
Monocytes Relative: 6 % (ref 3–12)
Neutro Abs: 5 10*3/uL (ref 1.7–7.7)
Neutrophils Relative %: 69 % (ref 43–77)
Platelets: 214 10*3/uL (ref 150–400)
RBC: 4.15 MIL/uL (ref 3.87–5.11)
RDW: 13.4 % (ref 11.5–15.5)
WBC: 7.2 10*3/uL (ref 4.0–10.5)

## 2014-06-05 LAB — LIPASE, BLOOD: Lipase: 36 U/L (ref 11–59)

## 2014-06-05 LAB — COMPREHENSIVE METABOLIC PANEL
ALK PHOS: 104 U/L (ref 39–117)
ALT: 10 U/L (ref 0–35)
AST: 25 U/L (ref 0–37)
Albumin: 3.6 g/dL (ref 3.5–5.2)
Anion gap: 13 (ref 5–15)
BUN: 9 mg/dL (ref 6–23)
CHLORIDE: 108 meq/L (ref 96–112)
CO2: 19 mEq/L (ref 19–32)
Calcium: 9.1 mg/dL (ref 8.4–10.5)
Creatinine, Ser: 0.96 mg/dL (ref 0.50–1.10)
GFR calc Af Amer: >90 mL/min (ref >90)
GFR, EST NON AFRICAN AMERICAN: 82 mL/min — AB (ref >90)
Glucose, Bld: 82 mg/dL (ref 70–99)
POTASSIUM: 4 meq/L (ref 3.7–5.3)
Sodium: 140 mEq/L (ref 137–147)
Total Bilirubin: 0.4 mg/dL (ref 0.3–1.2)
Total Protein: 7.4 g/dL (ref 6.0–8.3)

## 2014-06-05 LAB — URINALYSIS, ROUTINE W REFLEX MICROSCOPIC
BILIRUBIN URINE: NEGATIVE
Glucose, UA: NEGATIVE mg/dL
Hgb urine dipstick: NEGATIVE
Ketones, ur: NEGATIVE mg/dL
Nitrite: NEGATIVE
SPECIFIC GRAVITY, URINE: 1.02 (ref 1.005–1.030)
Urobilinogen, UA: 0.2 mg/dL (ref 0.0–1.0)
pH: 6 (ref 5.0–8.0)

## 2014-06-05 LAB — URINE MICROSCOPIC-ADD ON

## 2014-06-05 LAB — PREGNANCY, URINE: PREG TEST UR: NEGATIVE

## 2014-06-05 MED ORDER — CIPROFLOXACIN HCL 500 MG PO TABS: 500.0000 mg | ORAL_TABLET | Freq: Two times a day (BID) | ORAL | Status: DC

## 2014-06-05 MED ORDER — HYDROMORPHONE HCL PF 1 MG/ML IJ SOLN
1.0000 mg | Freq: Once | INTRAMUSCULAR | Status: AC
  Administered 2014-06-05: 1 mg via INTRAVENOUS
  Filled 2014-06-05: qty 1

## 2014-06-05 MED ORDER — IOHEXOL 300 MG/ML  SOLN
100.0000 mL | Freq: Once | INTRAMUSCULAR | Status: AC | PRN
  Administered 2014-06-05: 100 mL via INTRAVENOUS

## 2014-06-05 MED ORDER — SODIUM CHLORIDE 0.9 % IV BOLUS (SEPSIS)
250.0000 mL | Freq: Once | INTRAVENOUS | Status: AC
  Administered 2014-06-05: 250 mL via INTRAVENOUS

## 2014-06-05 MED ORDER — HYDROCODONE-ACETAMINOPHEN 5-325 MG PO TABS: 1.0000 | ORAL_TABLET | Freq: Four times a day (QID) | ORAL | Status: DC | PRN

## 2014-06-05 MED ORDER — ONDANSETRON HCL 4 MG/2ML IJ SOLN
4.0000 mg | Freq: Once | INTRAMUSCULAR | Status: AC
  Administered 2014-06-05: 4 mg via INTRAVENOUS
  Filled 2014-06-05: qty 2

## 2014-06-05 MED ORDER — SODIUM CHLORIDE 0.9 % IV SOLN
INTRAVENOUS | Status: DC
  Administered 2014-06-05: 13:00:00 via INTRAVENOUS

## 2014-06-05 MED ORDER — ONDANSETRON 4 MG PO TBDP: 4.0000 mg | ORAL_TABLET | Freq: Three times a day (TID) | ORAL | Status: DC | PRN

## 2014-06-05 NOTE — ED Notes (Signed)
Having abdomen pain for one week.

## 2014-06-05 NOTE — ED Notes (Signed)
Patient unable to provide urine specimen at this time.

## 2014-06-05 NOTE — ED Notes (Signed)
MD at bedside. 

## 2014-06-05 NOTE — ED Provider Notes (Signed)
CT d/w Pt family and Pt can f/u splenomegaly with PCP. ,Patient / Family / Caregiver informed of clinical course, understand medical decision-making process, and agree with plan.  Isabel HornJohn M Torrie Namba, MD 06/05/14 [phone]

## 2014-06-05 NOTE — Discharge Instructions (Signed)
Abdominal Pain, Women °Abdominal (stomach, pelvic, or belly) pain can be caused by many things. It is important to tell your doctor: °· The location of the pain. °· Does it come and go or is it present all the time? °· Are there things that start the pain (eating certain foods, exercise)? °· Are there other symptoms associated with the pain (fever, nausea, vomiting, diarrhea)? °All of this is helpful to know when trying to find the cause of the pain. °CAUSES  °· Stomach: virus or bacteria infection, or ulcer. °· Intestine: appendicitis (inflamed appendix), regional ileitis (Crohn's disease), ulcerative colitis (inflamed colon), irritable bowel syndrome, diverticulitis (inflamed diverticulum of the colon), or cancer of the stomach or intestine. °· Gallbladder disease or stones in the gallbladder. °· Kidney disease, kidney stones, or infection. °· Pancreas infection or cancer. °· Fibromyalgia (pain disorder). °· Diseases of the female organs: °¨ Uterus: fibroid (non-cancerous) tumors or infection. °¨ Fallopian tubes: infection or tubal pregnancy. °¨ Ovary: cysts or tumors. °¨ Pelvic adhesions (scar tissue). °¨ Endometriosis (uterus lining tissue growing in the pelvis and on the pelvic organs). °¨ Pelvic congestion syndrome (female organs filling up with blood just before the menstrual period). °¨ Pain with the menstrual period. °¨ Pain with ovulation (producing an egg). °¨ Pain with an IUD (intrauterine device, birth control) in the uterus. °¨ Cancer of the female organs. °· Functional pain (pain not caused by a disease, may improve without treatment). °· Psychological pain. °· Depression. °DIAGNOSIS  °Your doctor will decide the seriousness of your pain by doing an examination. °· Blood tests. °· X-rays. °· Ultrasound. °· CT scan (computed tomography, special type of X-ray). °· MRI (magnetic resonance imaging). °· Cultures, for infection. °· Barium enema (dye inserted in the large intestine, to better view it with  X-rays). °· Colonoscopy (looking in intestine with a lighted tube). °· Laparoscopy (minor surgery, looking in abdomen with a lighted tube). °· Major abdominal exploratory surgery (looking in abdomen with a large incision). °TREATMENT  °The treatment will depend on the cause of the pain.  °· Many cases can be observed and treated at home. °· Over-the-counter medicines recommended by your caregiver. °· Prescription medicine. °· Antibiotics, for infection. °· Birth control pills, for painful periods or for ovulation pain. °· Hormone treatment, for endometriosis. °· Nerve blocking injections. °· Physical therapy. °· Antidepressants. °· Counseling with a psychologist or psychiatrist. °· Minor or major surgery. °HOME CARE INSTRUCTIONS  °· Do not take laxatives, unless directed by your caregiver. °· Take over-the-counter pain medicine only if ordered by your caregiver. Do not take aspirin because it can cause an upset stomach or bleeding. °· Try a clear liquid diet (broth or water) as ordered by your caregiver. Slowly move to a bland diet, as tolerated, if the pain is related to the stomach or intestine. °· Have a thermometer and take your temperature several times a day, and record it. °· Bed rest and sleep, if it helps the pain. °· Avoid sexual intercourse, if it causes pain. °· Avoid stressful situations. °· Keep your follow-up appointments and tests, as your caregiver orders. °· If the pain does not go away with medicine or surgery, you may try: °¨ Acupuncture. °¨ Relaxation exercises (yoga, meditation). °¨ Group therapy. °¨ Counseling. °SEEK MEDICAL CARE IF:  °· You notice certain foods cause stomach pain. °· Your home care treatment is not helping your pain. °· You need stronger pain medicine. °· You want your IUD removed. °· You feel faint or   lightheaded.  You develop nausea and vomiting.  You develop a rash.  You are having side effects or an allergy to your medicine. SEEK IMMEDIATE MEDICAL CARE IF:   Your  pain does not go away or gets worse.  You have a fever.  Your pain is felt only in portions of the abdomen. The right side could possibly be appendicitis. The left lower portion of the abdomen could be colitis or diverticulitis.  You are passing blood in your stools (bright red or black tarry stools, with or without vomiting).  You have blood in your urine.  You develop chills, with or without a fever.  You pass out. MAKE SURE YOU:   Understand these instructions.  Will watch your condition.  Will get help right away if you are not doing well or get worse. Document Released: 08/30/2007 Document Revised: 01/25/2012 Document Reviewed: 09/19/2009 [facility] Patient Information 2015 [employer], [place]. This information is not intended to replace advice given to you by your health care provider. Make sure you discuss any questions you have with your health care provider.  Take antibiotic as directed for the urinary tract infection. Take pain medicine as needed. Take antinausea medicine as needed for the nausea. Make an appointment to followup with your Dr. Return for any newer worse symptoms or not improving in 2 days.

## 2014-06-05 NOTE — ED Provider Notes (Signed)
CSN: [id number]     Arrival date & time 06/05/14  1107 History  This chart was scribed for Vanetta Mulders, MD by Elon Spanner, ED Scribe. This patient was seen in room APA05/APA05 and the patient's care was started at 12:14 PM.   Chief Complaint  Patient presents with  . Abdominal Pain    The history is provided by the patient. No language interpreter was used.    HPI Comments: [name] is a 28 y.o. female with past medical history of GERD, gastritis who presents to the Emergency Department complaining of intermittent, non-radiating epigastric abdominal pain that began 1 week ago but worsened at 9 pm yesterday.  She rates her current pain at 9/10 and describes it as sharp and throbbing.  She has sought treatment for pain akin to this episode before but was never received a diagnosis and is unsure whether imaging was obtained.  Patient has associated nausea and 8 episodes of diarrhea. She denies vomiting and hematochezia. LMP was 7/6.   Past Medical History  Diagnosis Date  . Herpes   . GERD (gastroesophageal reflux disease)   . Gastritis   . Disk prolapse   . Bulging disc   . Obesity   . Mental disorder     anxiety  . Other and unspecified ovarian cyst 12/01/2013  . Elevated liver enzymes 12/01/2013  . Ovarian cyst, right    Past Surgical History  Procedure Laterality Date  . Wisdom tooth extraction    . Esophagogastroduodenoscopy  01/19/2012    YNW:GNFAOZ,HYQMVHQI IN THE ANTRUM/HIATAL HERNIA/  . Colonoscopy with esophagogastroduodenoscopy (egd) N/A 02/24/2013    ONG:EXBMWUXLKGMW RECTAL BLEEDING DUE TO Moderate sized internal hemorrhoids, EGD: erosive esophagitis due to uncontrolled reflux, moderate erosive gastritis, benign path   Family History  Problem Relation Age of Onset  . Diabetes Mother   . Diabetes Other   . Hypertension Other   . Colon cancer Neg Hx   . Colon polyps Neg Hx   . Diabetes Maternal Grandmother   . COPD Maternal Grandmother   . Diabetes Maternal  Grandfather    History  Substance Use Topics  . Smoking status: Former Smoker -- 0.25 packs/day for .5 years    Types: Cigarettes    Quit date: 07/28/2011  . Smokeless tobacco: Never Used  . Alcohol Use: No   OB History   Grav Para Term Preterm Abortions TAB SAB Ect Mult Living   1 1 1       1      Review of Systems  Constitutional: Positive for chills. Negative for fever.  HENT: Positive for congestion and rhinorrhea. Negative for sore throat.   Eyes: Negative for visual disturbance.  Respiratory: Negative for cough and shortness of breath.   Cardiovascular: Negative for chest pain and leg swelling.  Gastrointestinal: Positive for nausea, abdominal pain and diarrhea. Negative for vomiting, blood in stool and anal bleeding.  Genitourinary: Negative for dysuria.  Musculoskeletal: Negative for back pain and neck pain.  Skin: Negative for rash.  Neurological: Positive for dizziness and headaches.  Hematological: Does not bruise/bleed easily.  Psychiatric/Behavioral: Negative for confusion.      Allergies  Review of patient's allergies indicates no known allergies.  Home Medications   Prior to Admission medications   Medication Sig Start Date End Date Taking? Authorizing Provider  DULoxetine (CYMBALTA) 60 MG capsule Take 60 mg by mouth at bedtime.   Yes Historical Provider, MD  norethindrone-ethinyl estradiol (OVCON-50) 1-50 MG-MCG tablet Take 1 tablet by mouth  daily. 01/03/14  Yes Tilda Burrow, MD  pantoprazole (PROTONIX) 40 MG tablet TAKE 1 TABLET (40 MG TOTAL) BY MOUTH 2 (TWO) TIMES DAILY. 11/20/13  Yes Tiffany Kocher, PA-C  tiZANidine (ZANAFLEX) 2 MG tablet Take 2 mg by mouth at bedtime.    Yes Historical Provider, MD  topiramate (TOPAMAX) 100 MG tablet Take 100 mg by mouth daily.   Yes Historical Provider, MD  traMADol (ULTRAM) 50 MG tablet Take 1 tablet (50 mg total) by mouth every 6 (six) hours as needed for pain. 07/17/13  Yes [name], MD  traZODone  (DESYREL) 100 MG tablet Take 200 mg by mouth at bedtime.    Yes Historical Provider, MD  valACYclovir (VALTREX) 1000 MG tablet Take 1,000 mg by mouth at bedtime.   Yes Historical Provider, MD   BP 106/69  Pulse 93  Temp(Src) 97.8 F (36.6 C) (Oral)  Resp 18  SpO2 100%  LMP 05/21/2014 Physical Exam  Nursing note and vitals reviewed. Constitutional: She is oriented to person, place, and time. She appears well-developed and well-nourished. No distress.  HENT:  Head: Normocephalic and atraumatic.  Eyes: Conjunctivae and EOM are normal.  Neck: Neck supple. No tracheal deviation present.  Cardiovascular: Normal rate, regular rhythm and normal heart sounds.   No murmur heard. Pulmonary/Chest: Effort normal and breath sounds normal. No respiratory distress. She has no wheezes. She has no rales.  Abdominal: Soft. Bowel sounds are normal. She exhibits no distension. There is tenderness. There is no guarding.  Upper quadrant abdominal tenderness. RUQ tenderness greater than LUQ.   Musculoskeletal: Normal range of motion. She exhibits no edema.  Neurological: She is alert and oriented to person, place, and time. No cranial nerve deficit.  Skin: Skin is warm and dry.  Psychiatric: She has a normal mood and affect. Her behavior is normal.    ED Course  Procedures (including critical care time)  DIAGNOSTIC STUDIES: Oxygen Saturation is 97% on RA, normal by my interpretation.    COORDINATION OF CARE:  12:20 PM Discussed plan to order labs.  Patient acknowledges and agrees with plan.    Labs Review Labs Reviewed  URINALYSIS, ROUTINE W REFLEX MICROSCOPIC - Abnormal; Notable for the following:    Protein, ur TRACE (*)    Leukocytes, UA MODERATE (*)    All other components within normal limits  COMPREHENSIVE METABOLIC PANEL - Abnormal; Notable for the following:    GFR calc non Af Amer 82 (*)    All other components within normal limits  URINE MICROSCOPIC-ADD ON - Abnormal; Notable for the  following:    Squamous Epithelial / LPF FEW (*)    Bacteria, UA FEW (*)    All other components within normal limits  URINE CULTURE  PREGNANCY, URINE  LIPASE, BLOOD  CBC WITH DIFFERENTIAL   Results for orders placed during the hospital encounter of 06/05/14  URINALYSIS, ROUTINE W REFLEX MICROSCOPIC      Result Value Ref Range   Color, Urine YELLOW  YELLOW   APPearance CLEAR  CLEAR   Specific Gravity, Urine 1.020  1.005 - 1.030   pH 6.0  5.0 - 8.0   Glucose, UA NEGATIVE  NEGATIVE mg/dL   Hgb urine dipstick NEGATIVE  NEGATIVE   Bilirubin Urine NEGATIVE  NEGATIVE   Ketones, ur NEGATIVE  NEGATIVE mg/dL   Protein, ur TRACE (*) NEGATIVE mg/dL   Urobilinogen, UA 0.2  0.0 - 1.0 mg/dL   Nitrite NEGATIVE  NEGATIVE   Leukocytes, UA MODERATE (*)  NEGATIVE  PREGNANCY, URINE      Result Value Ref Range   Preg Test, Ur NEGATIVE  NEGATIVE  COMPREHENSIVE METABOLIC PANEL      Result Value Ref Range   Sodium 140  137 - 147 mEq/L   Potassium 4.0  3.7 - 5.3 mEq/L   Chloride 108  96 - 112 mEq/L   CO2 19  19 - 32 mEq/L   Glucose, Bld 82  70 - 99 mg/dL   BUN 9  6 - 23 mg/dL   Creatinine, Ser 1.610.96  0.50 - 1.10 mg/dL   Calcium 9.1  8.4 - 09.610.5 mg/dL   Total Protein 7.4  6.0 - 8.3 g/dL   Albumin 3.6  3.5 - 5.2 g/dL   AST 25  0 - 37 U/L   ALT 10  0 - 35 U/L   Alkaline Phosphatase 104  39 - 117 U/L   Total Bilirubin 0.4  0.3 - 1.2 mg/dL   GFR calc non Af Amer 82 (*) >90 mL/min   GFR calc Af Amer >90  >90 mL/min   Anion gap 13  5 - 15  LIPASE, BLOOD      Result Value Ref Range   Lipase 36  11 - 59 U/L  CBC WITH DIFFERENTIAL      Result Value Ref Range   WBC 7.2  4.0 - 10.5 K/uL   RBC 4.15  3.87 - 5.11 MIL/uL   Hemoglobin 13.0  12.0 - 15.0 g/dL   HCT 04.537.6  40.936.0 - 81.146.0 %   MCV 90.6  78.0 - 100.0 fL   MCH 31.3  26.0 - 34.0 pg   MCHC 34.6  30.0 - 36.0 g/dL   RDW 91.413.4  78.211.5 - 95.615.5 %   Platelets 214  150 - 400 K/uL   Neutrophils Relative % 69  43 - 77 %   Neutro Abs 5.0  1.7 - 7.7 K/uL    Lymphocytes Relative 24  12 - 46 %   Lymphs Abs 1.7  0.7 - 4.0 K/uL   Monocytes Relative 6  3 - 12 %   Monocytes Absolute 0.5  0.1 - 1.0 K/uL   Eosinophils Relative 1  0 - 5 %   Eosinophils Absolute 0.1  0.0 - 0.7 K/uL   Basophils Relative 0  0 - 1 %   Basophils Absolute 0.0  0.0 - 0.1 K/uL  URINE MICROSCOPIC-ADD ON      Result Value Ref Range   Squamous Epithelial / LPF FEW (*) RARE   WBC, UA 11-20  <3 WBC/hpf   Bacteria, UA FEW (*) RARE     Imaging Review No results found.   EKG Interpretation None      MDM   Final diagnoses:  Abdominal pain, epigastric    Patient's workup based on labs without any significant findings to explain the epigastric abdominal pain. No leukocytosis. Liver function tests are normal bilirubin non-elevated lipase not elevated not consistent with pancreatitis. CT scan of abdomen and pelvis is pending. Urinalysis had 11-20 white support only a few bacteria. Possible urinary tract infection urine culture sent.    I personally performed the services described in this documentation, which was scribed in my presence. The recorded information has been reviewed and is accurate.     Vanetta MuldersScott Antinette Keough, MD 06/05/14 [phone]

## 2014-06-06 LAB — URINE CULTURE

## 2014-06-08 ENCOUNTER — Observation Stay (HOSPITAL_COMMUNITY)
Admission: EM | Admit: 2014-06-08 | Discharge: 2014-06-10 | Disposition: A | Discharge status: Home / Self Care | Payer: Medicaid Other | Attending: Family Medicine | Admitting: Family Medicine

## 2014-06-08 ENCOUNTER — Encounter (HOSPITAL_COMMUNITY): Payer: Self-pay | Admitting: Emergency Medicine

## 2014-06-08 ENCOUNTER — Emergency Department (HOSPITAL_COMMUNITY): Payer: Medicaid Other

## 2014-06-08 DIAGNOSIS — Z8739 Personal history of other diseases of the musculoskeletal system and connective tissue: Secondary | ICD-10-CM | POA: Diagnosis not present

## 2014-06-08 DIAGNOSIS — R112 Nausea with vomiting, unspecified: Principal | ICD-10-CM | POA: Diagnosis present

## 2014-06-08 DIAGNOSIS — R1115 Cyclical vomiting syndrome unrelated to migraine: Secondary | ICD-10-CM

## 2014-06-08 DIAGNOSIS — R197 Diarrhea, unspecified: Secondary | ICD-10-CM | POA: Insufficient documentation

## 2014-06-08 DIAGNOSIS — E669 Obesity, unspecified: Secondary | ICD-10-CM | POA: Insufficient documentation

## 2014-06-08 DIAGNOSIS — K2970 Gastritis, unspecified, without bleeding: Secondary | ICD-10-CM | POA: Diagnosis present

## 2014-06-08 DIAGNOSIS — Z8619 Personal history of other infectious and parasitic diseases: Secondary | ICD-10-CM | POA: Insufficient documentation

## 2014-06-08 DIAGNOSIS — R1013 Epigastric pain: Secondary | ICD-10-CM | POA: Diagnosis present

## 2014-06-08 DIAGNOSIS — Z79899 Other long term (current) drug therapy: Secondary | ICD-10-CM | POA: Insufficient documentation

## 2014-06-08 DIAGNOSIS — K2990 Gastroduodenitis, unspecified, without bleeding: Secondary | ICD-10-CM

## 2014-06-08 DIAGNOSIS — Z792 Long term (current) use of antibiotics: Secondary | ICD-10-CM | POA: Insufficient documentation

## 2014-06-08 DIAGNOSIS — R109 Unspecified abdominal pain: Secondary | ICD-10-CM | POA: Diagnosis present

## 2014-06-08 DIAGNOSIS — E860 Dehydration: Secondary | ICD-10-CM | POA: Insufficient documentation

## 2014-06-08 DIAGNOSIS — G8929 Other chronic pain: Secondary | ICD-10-CM | POA: Diagnosis not present

## 2014-06-08 DIAGNOSIS — Z8742 Personal history of other diseases of the female genital tract: Secondary | ICD-10-CM | POA: Diagnosis not present

## 2014-06-08 DIAGNOSIS — F411 Generalized anxiety disorder: Secondary | ICD-10-CM | POA: Insufficient documentation

## 2014-06-08 DIAGNOSIS — K219 Gastro-esophageal reflux disease without esophagitis: Secondary | ICD-10-CM | POA: Diagnosis not present

## 2014-06-08 DIAGNOSIS — Z3202 Encounter for pregnancy test, result negative: Secondary | ICD-10-CM | POA: Insufficient documentation

## 2014-06-08 DIAGNOSIS — Z87891 Personal history of nicotine dependence: Secondary | ICD-10-CM | POA: Insufficient documentation

## 2014-06-08 DIAGNOSIS — R1110 Vomiting, unspecified: Secondary | ICD-10-CM

## 2014-06-08 HISTORY — DX: Headache, unspecified: R51.9

## 2014-06-08 HISTORY — DX: Right lower quadrant pain: R10.31

## 2014-06-08 HISTORY — DX: Headache: R51

## 2014-06-08 HISTORY — DX: Other chronic pain: G89.29

## 2014-06-08 HISTORY — DX: Epigastric pain: R10.13

## 2014-06-08 LAB — URINALYSIS, ROUTINE W REFLEX MICROSCOPIC
BILIRUBIN URINE: NEGATIVE
Glucose, UA: NEGATIVE mg/dL
Hgb urine dipstick: NEGATIVE
Ketones, ur: 15 mg/dL — AB
NITRITE: NEGATIVE
Protein, ur: NEGATIVE mg/dL
Specific Gravity, Urine: >1.03 — ABNORMAL HIGH (ref 1.005–1.030)
UROBILINOGEN UA: 0.2 mg/dL (ref 0.0–1.0)
pH: 5.5 (ref 5.0–8.0)

## 2014-06-08 LAB — CBC WITH DIFFERENTIAL/PLATELET
BASOS ABS: 0 10*3/uL (ref 0.0–0.1)
Basophils Relative: 1 % (ref 0–1)
EOS PCT: 1 % (ref 0–5)
Eosinophils Absolute: 0.1 10*3/uL (ref 0.0–0.7)
HEMATOCRIT: 40.6 % (ref 36.0–46.0)
Hemoglobin: 14.4 g/dL (ref 12.0–15.0)
Lymphocytes Relative: 16 % (ref 12–46)
Lymphs Abs: 1.2 10*3/uL (ref 0.7–4.0)
MCH: 31.9 pg (ref 26.0–34.0)
MCHC: 35.5 g/dL (ref 30.0–36.0)
MCV: 90 fL (ref 78.0–100.0)
MONO ABS: 0.6 10*3/uL (ref 0.1–1.0)
Monocytes Relative: 7 % (ref 3–12)
Neutro Abs: 5.7 10*3/uL (ref 1.7–7.7)
Neutrophils Relative %: 75 % (ref 43–77)
Platelets: 263 10*3/uL (ref 150–400)
RBC: 4.51 MIL/uL (ref 3.87–5.11)
RDW: 13.5 % (ref 11.5–15.5)
WBC: 7.6 10*3/uL (ref 4.0–10.5)

## 2014-06-08 LAB — COMPREHENSIVE METABOLIC PANEL
ALT: 12 U/L (ref 0–35)
AST: 33 U/L (ref 0–37)
Albumin: 3.9 g/dL (ref 3.5–5.2)
Alkaline Phosphatase: 102 U/L (ref 39–117)
Anion gap: 14 (ref 5–15)
BUN: 8 mg/dL (ref 6–23)
CO2: 18 mEq/L — ABNORMAL LOW (ref 19–32)
Calcium: 9.5 mg/dL (ref 8.4–10.5)
Chloride: 110 mEq/L (ref 96–112)
Creatinine, Ser: 1.12 mg/dL — ABNORMAL HIGH (ref 0.50–1.10)
GFR calc Af Amer: 78 mL/min — ABNORMAL LOW (ref >90)
GFR calc non Af Amer: 68 mL/min — ABNORMAL LOW (ref >90)
Glucose, Bld: 94 mg/dL (ref 70–99)
Potassium: 3.7 mEq/L (ref 3.7–5.3)
Sodium: 142 mEq/L (ref 137–147)
Total Bilirubin: 0.3 mg/dL (ref 0.3–1.2)
Total Protein: 7.8 g/dL (ref 6.0–8.3)

## 2014-06-08 LAB — URINE MICROSCOPIC-ADD ON

## 2014-06-08 LAB — LIPASE, BLOOD: Lipase: 35 U/L (ref 11–59)

## 2014-06-08 LAB — PREGNANCY, URINE: PREG TEST UR: NEGATIVE

## 2014-06-08 MED ORDER — TIZANIDINE HCL 4 MG PO TABS
2.0000 mg | ORAL_TABLET | Freq: Every day | ORAL | Status: DC
  Administered 2014-06-08 – 2014-06-09 (×2): 2 mg via ORAL
  Filled 2014-06-08 (×2): qty 1

## 2014-06-08 MED ORDER — PANTOPRAZOLE SODIUM 40 MG PO TBEC
40.0000 mg | DELAYED_RELEASE_TABLET | Freq: Once | ORAL | Status: AC
  Administered 2014-06-08: 40 mg via ORAL
  Filled 2014-06-08: qty 1

## 2014-06-08 MED ORDER — ONDANSETRON HCL 4 MG PO TABS: 4.0000 mg | ORAL_TABLET | Freq: Four times a day (QID) | ORAL | Status: DC | PRN

## 2014-06-08 MED ORDER — SODIUM CHLORIDE 0.9 % IV SOLN
INTRAVENOUS | Status: DC
  Administered 2014-06-08: 19:00:00 via INTRAVENOUS

## 2014-06-08 MED ORDER — TOPIRAMATE 100 MG PO TABS
100.0000 mg | ORAL_TABLET | Freq: Every morning | ORAL | Status: DC
  Administered 2014-06-08 – 2014-06-10 (×2): 100 mg via ORAL
  Filled 2014-06-08 (×3): qty 1

## 2014-06-08 MED ORDER — ONDANSETRON HCL 4 MG/2ML IJ SOLN: 4.0000 mg | INTRAMUSCULAR | Status: DC | PRN

## 2014-06-08 MED ORDER — PROMETHAZINE HCL 25 MG/ML IJ SOLN
12.5000 mg | Freq: Once | INTRAMUSCULAR | Status: AC
  Administered 2014-06-08: 12.5 mg via INTRAVENOUS
  Filled 2014-06-08: qty 1

## 2014-06-08 MED ORDER — PANTOPRAZOLE SODIUM 40 MG PO TBEC
40.0000 mg | DELAYED_RELEASE_TABLET | Freq: Every morning | ORAL | Status: DC
  Administered 2014-06-08: 40 mg via ORAL
  Filled 2014-06-08: qty 1

## 2014-06-08 MED ORDER — MORPHINE SULFATE 2 MG/ML IJ SOLN
1.0000 mg | INTRAMUSCULAR | Status: DC | PRN
  Administered 2014-06-08 – 2014-06-10 (×6): 1 mg via INTRAVENOUS
  Filled 2014-06-08 (×6): qty 1

## 2014-06-08 MED ORDER — PANTOPRAZOLE SODIUM 40 MG IV SOLR: 40.0000 mg | INTRAVENOUS | Status: DC

## 2014-06-08 MED ORDER — SODIUM CHLORIDE 0.9 % IV SOLN
INTRAVENOUS | Status: AC
  Administered 2014-06-09: 11:00:00 via INTRAVENOUS

## 2014-06-08 MED ORDER — ONDANSETRON HCL 4 MG/2ML IJ SOLN
4.0000 mg | Freq: Three times a day (TID) | INTRAMUSCULAR | Status: AC | PRN
  Filled 2014-06-08: qty 2

## 2014-06-08 MED ORDER — TRAZODONE HCL 50 MG PO TABS
200.0000 mg | ORAL_TABLET | Freq: Every day | ORAL | Status: DC
  Administered 2014-06-08 – 2014-06-09 (×2): 200 mg via ORAL
  Filled 2014-06-08 (×2): qty 4

## 2014-06-08 MED ORDER — SODIUM CHLORIDE 0.9 % IV SOLN
INTRAVENOUS | Status: AC
  Administered 2014-06-08 – 2014-06-09 (×2): via INTRAVENOUS

## 2014-06-08 MED ORDER — DULOXETINE HCL 60 MG PO CPEP
60.0000 mg | ORAL_CAPSULE | Freq: Every day | ORAL | Status: DC
  Administered 2014-06-08 – 2014-06-09 (×2): 60 mg via ORAL
  Filled 2014-06-08 (×2): qty 1

## 2014-06-08 MED ORDER — MORPHINE SULFATE 4 MG/ML IJ SOLN
4.0000 mg | INTRAMUSCULAR | Status: AC | PRN
  Administered 2014-06-08 (×2): 4 mg via INTRAVENOUS
  Filled 2014-06-08 (×2): qty 1

## 2014-06-08 MED ORDER — ONDANSETRON HCL 4 MG/2ML IJ SOLN
4.0000 mg | Freq: Four times a day (QID) | INTRAMUSCULAR | Status: DC | PRN
  Administered 2014-06-08 – 2014-06-09 (×3): 4 mg via INTRAVENOUS
  Filled 2014-06-08 (×2): qty 2

## 2014-06-08 MED ORDER — ALUM & MAG HYDROXIDE-SIMETH 200-200-20 MG/5ML PO SUSP: 30.0000 mL | Freq: Four times a day (QID) | ORAL | Status: DC | PRN

## 2014-06-08 MED ORDER — CIPROFLOXACIN HCL 250 MG PO TABS
500.0000 mg | ORAL_TABLET | Freq: Two times a day (BID) | ORAL | Status: DC
  Administered 2014-06-08 – 2014-06-10 (×4): 500 mg via ORAL
  Filled 2014-06-08 (×4): qty 2

## 2014-06-08 MED ORDER — FAMOTIDINE IN NACL 20-0.9 MG/50ML-% IV SOLN
20.0000 mg | Freq: Once | INTRAVENOUS | Status: AC
  Administered 2014-06-08: 20 mg via INTRAVENOUS
  Filled 2014-06-08: qty 50

## 2014-06-08 MED ORDER — ONDANSETRON HCL 4 MG/2ML IJ SOLN
4.0000 mg | INTRAMUSCULAR | Status: AC | PRN
  Administered 2014-06-08 (×2): 4 mg via INTRAVENOUS
  Filled 2014-06-08 (×2): qty 2

## 2014-06-08 MED ORDER — SODIUM CHLORIDE 0.9 % IV BOLUS (SEPSIS)
1000.0000 mL | Freq: Once | INTRAVENOUS | Status: AC
  Administered 2014-06-08: 1000 mL via INTRAVENOUS

## 2014-06-08 NOTE — ED Notes (Signed)
Pt reports seen for same on Tuesday. Pt reports continued nausea,vomiting,abdominal pain.

## 2014-06-08 NOTE — ED Provider Notes (Signed)
CSN: [id number]     Arrival date & time 06/08/14  1619 History   First MD Initiated Contact with Patient 06/08/14 1656     Chief Complaint  Patient presents with  . Abdominal Pain     HPI Pt was seen at 1655.  Per pt, c/o gradual onset and persistence of constant upper abd "pain" for the past 1 -2 weeks.  Has been associated with multiple intermittent episodes of N/V/D.  Describes the abd pain as "sharp" and "stabbing."  Pt was evaluated in the ED 3 days ago for same, dx UTI, rx cipro, norco and zofran. States she has been trying to take the medications, but has not had improvement in her symptoms. Denies fevers, no back pain, no rash, no CP/SOB, no black or blood in stools or emesis.       Past Medical History  Diagnosis Date  . Herpes   . GERD (gastroesophageal reflux disease)   . Gastritis   . Disk prolapse   . Bulging disc   . Obesity   . Mental disorder     anxiety  . Other and unspecified ovarian cyst 12/01/2013  . Elevated liver enzymes 12/01/2013  . Ovarian cyst, right   . Headache   . Abdominal pain, chronic, right lower quadrant   . Abdominal pain, chronic, epigastric    Past Surgical History  Procedure Laterality Date  . Wisdom tooth extraction    . Esophagogastroduodenoscopy  01/19/2012    WUJ:WJXBJY,NWGNFAOZSLF:ULCERS,MULTIPLE IN THE ANTRUM/HIATAL HERNIA/  . Colonoscopy with esophagogastroduodenoscopy (egd) N/A 02/24/2013    HYQ:[id number]:INTERMITTENT RECTAL BLEEDING DUE TO Moderate sized internal hemorrhoids, EGD: erosive esophagitis due to uncontrolled reflux, moderate erosive gastritis, benign path   Family History  Problem Relation Age of Onset  . Diabetes Mother   . Diabetes Other   . Hypertension Other   . Colon cancer Neg Hx   . Colon polyps Neg Hx   . Diabetes Maternal Grandmother   . COPD Maternal Grandmother   . Diabetes Maternal Grandfather    History  Substance Use Topics  . Smoking status: Former Smoker -- 0.25 packs/day for .5 years    Types: Cigarettes    Quit date:  07/28/2011  . Smokeless tobacco: Never Used  . Alcohol Use: No   OB History   Grav Para Term Preterm Abortions TAB SAB Ect Mult Living   1 1 1       1      Review of Systems ROS: Statement: All systems negative except as marked or noted in the HPI; Constitutional: Negative for fever and chills. ; ; Eyes: Negative for eye pain, redness and discharge. ; ; ENMT: Negative for ear pain, hoarseness, nasal congestion, sinus pressure and sore throat. ; ; Cardiovascular: Negative for chest pain, palpitations, diaphoresis, dyspnea and peripheral edema. ; ; Respiratory: Negative for cough, wheezing and stridor. ; ; Gastrointestinal: +N/V/D, abd pain. Negative for blood in stool, hematemesis, jaundice and rectal bleeding. . ; ; Genitourinary: Negative for dysuria, flank pain and hematuria. ; ; Musculoskeletal: Negative for back pain and neck pain. Negative for swelling and trauma.; ; Skin: Negative for pruritus, rash, abrasions, blisters, bruising and skin lesion.; ; Neuro: Negative for headache, lightheadedness and neck stiffness. Negative for weakness, altered level of consciousness , altered mental status, extremity weakness, paresthesias, involuntary movement, seizure and syncope.      Allergies  Review of patient's allergies indicates no known allergies.  Home Medications   Prior to Admission medications   Medication Sig  Start Date End Date Taking? Authorizing Provider  ciprofloxacin (CIPRO) 500 MG tablet Take 1 tablet (500 mg total) by mouth 2 (two) times daily. 06/05/14  Yes Vanetta Mulders, MD  DULoxetine (CYMBALTA) 60 MG capsule Take 60 mg by mouth at bedtime.   Yes Historical Provider, MD  HYDROcodone-acetaminophen (NORCO/VICODIN) 5-325 MG per tablet Take 1-2 tablets by mouth every 6 (six) hours as needed. 06/05/14  Yes Vanetta Mulders, MD  norethindrone-ethinyl estradiol (OVCON-50) 1-50 MG-MCG tablet Take 1 tablet by mouth daily. 01/03/14  Yes Tilda Burrow, MD  ondansetron (ZOFRAN ODT) 4 MG  disintegrating tablet Take 1 tablet (4 mg total) by mouth every 8 (eight) hours as needed. 06/05/14  Yes Vanetta Mulders, MD  pantoprazole (PROTONIX) 40 MG tablet Take 40 mg by mouth daily.   Yes Historical Provider, MD  tiZANidine (ZANAFLEX) 2 MG tablet Take 2 mg by mouth at bedtime.    Yes Historical Provider, MD  topiramate (TOPAMAX) 100 MG tablet Take 100 mg by mouth daily.   Yes Historical Provider, MD  traMADol (ULTRAM) 50 MG tablet Take 1 tablet (50 mg total) by mouth every 6 (six) hours as needed for pain. 07/17/13  Yes [name], MD  traZODone (DESYREL) 100 MG tablet Take 200 mg by mouth at bedtime.    Yes Historical Provider, MD  valACYclovir (VALTREX) 1000 MG tablet Take 1,000 mg by mouth at bedtime.   Yes Historical Provider, MD   BP 120/99  Pulse 93  Temp(Src) 98.3 F (36.8 C) (Oral)  Ht 5\' 6"  (1.676 m)  Wt 316 lb (143.337 kg)  BMI 51.03 kg/m2  SpO2 99%  LMP 05/21/2014 Physical Exam 1700: Physical examination:  Nursing notes reviewed; Vital signs and O2 SAT reviewed;  Constitutional: Well developed, Well nourished, In no acute distress; Head:  Normocephalic, atraumatic; Eyes: EOMI, PERRL, No scleral icterus; ENMT: Mouth and pharynx normal, Mucous membranes dry; Neck: Supple, Full range of motion, No lymphadenopathy; Cardiovascular: Regular rate and rhythm, No murmur, rub, or gallop; Respiratory: Breath sounds clear & equal bilaterally, No rales, rhonchi, wheezes.  Speaking full sentences with ease, Normal respiratory effort/excursion; Chest: Nontender, Movement normal; Abdomen: Soft, +RUQ, mid-epigastric, LUQ tenderness to palp. No rebound or guarding. Nondistended, Normal bowel sounds; Genitourinary: No CVA tenderness; Extremities: Pulses normal, No tenderness, No edema, No calf edema or asymmetry.; Neuro: AA&Ox3, Major CN grossly intact.  Speech clear. No gross focal motor or sensory deficits in extremities.; Skin: Color normal, Warm, Dry.   ED Course  Procedures      MDM  MDM Reviewed: previous chart, nursing note and vitals Reviewed previous: labs and CT scan Interpretation: labs and x-ray    Ct Abdomen Pelvis W Contrast 06/05/2014   CLINICAL DATA:  One week history of epigastric pain and diarrhea  EXAM: CT ABDOMEN AND PELVIS WITH CONTRAST  TECHNIQUE: Multidetector CT imaging of the abdomen and pelvis was performed using the standard protocol following bolus administration of intravenous contrast.  CONTRAST:  OMNIPAQUE IOHEXOL 300 MG/ML SOLN intravenously ; the patient also received oral contrast material.  COMPARISON:  CT scan of the abdomen and pelvis dated November 27, 2013.  FINDINGS: The liver exhibits no focal mass or ductal dilation. The gallbladder is adequately distended with no evidence of stones. The spleen is enlarged with maximal measured dimension of 15 cm with a calculated splenic volume of 956 cc. The pancreas, adrenal glands, and kidneys are normal. The abdominal aorta and surrounding soft tissues are normal.  The stomach is moderately  distended with contrast. There is no perigastric inflammatory change or fluid collections. The small bowel is normal. The appendix is not discretely demonstrated. The partially contrast filled colon exhibits no evidence of obstruction or acute inflammation. The mesenteric fat exhibits no abnormal masses or areas of inflammation. There is no ascites.  The urinary bladder, uterus, and adnexal structures are unremarkable. There is no free pelvic fluid. There is no inguinal or umbilical hernia. The psoas musculature is normal. The lumbar spine and bony pelvis exhibit no acute abnormalities. The lung bases are clear.  IMPRESSION: 1. There is splenomegaly.  There is no lymphadenopathy. 2. There is no acute abnormality of the liver or gallbladder or pancreas. 3. The urinary tract, uterus and adnexal structures, and bowel are unremarkable.   Electronically Signed   By: David  [place]   On: 06/05/2014 16:22     Results for orders placed during the hospital encounter of 06/08/14  URINALYSIS, ROUTINE W REFLEX MICROSCOPIC      Result Value Ref Range   Color, Urine YELLOW  YELLOW   APPearance HAZY (*) CLEAR   Specific Gravity, Urine >1.030 (*) 1.005 - 1.030   pH 5.5  5.0 - 8.0   Glucose, UA NEGATIVE  NEGATIVE mg/dL   Hgb urine dipstick NEGATIVE  NEGATIVE   Bilirubin Urine NEGATIVE  NEGATIVE   Ketones, ur 15 (*) NEGATIVE mg/dL   Protein, ur NEGATIVE  NEGATIVE mg/dL   Urobilinogen, UA 0.2  0.0 - 1.0 mg/dL   Nitrite NEGATIVE  NEGATIVE   Leukocytes, UA SMALL (*) NEGATIVE  PREGNANCY, URINE      Result Value Ref Range   Preg Test, Ur NEGATIVE  NEGATIVE  CBC WITH DIFFERENTIAL      Result Value Ref Range   WBC 7.6  4.0 - 10.5 K/uL   RBC 4.51  3.87 - 5.11 MIL/uL   Hemoglobin 14.4  12.0 - 15.0 g/dL   HCT 16.1  09.6 - 04.5 %   MCV 90.0  78.0 - 100.0 fL   MCH 31.9  26.0 - 34.0 pg   MCHC 35.5  30.0 - 36.0 g/dL   RDW 40.9  81.1 - 91.4 %   Platelets 263  150 - 400 K/uL   Neutrophils Relative % 75  43 - 77 %   Neutro Abs 5.7  1.7 - 7.7 K/uL   Lymphocytes Relative 16  12 - 46 %   Lymphs Abs 1.2  0.7 - 4.0 K/uL   Monocytes Relative 7  3 - 12 %   Monocytes Absolute 0.6  0.1 - 1.0 K/uL   Eosinophils Relative 1  0 - 5 %   Eosinophils Absolute 0.1  0.0 - 0.7 K/uL   Basophils Relative 1  0 - 1 %   Basophils Absolute 0.0  0.0 - 0.1 K/uL  COMPREHENSIVE METABOLIC PANEL      Result Value Ref Range   Sodium 142  137 - 147 mEq/L   Potassium 3.7  3.7 - 5.3 mEq/L   Chloride 110  96 - 112 mEq/L   CO2 18 (*) 19 - 32 mEq/L   Glucose, Bld 94  70 - 99 mg/dL   BUN 8  6 - 23 mg/dL   Creatinine, Ser 7.82 (*) 0.50 - 1.10 mg/dL   Calcium 9.5  8.4 - 95.6 mg/dL   Total Protein 7.8  6.0 - 8.3 g/dL   Albumin 3.9  3.5 - 5.2 g/dL   AST 33  0 - 37 U/L   ALT  12  0 - 35 U/L   Alkaline Phosphatase 102  39 - 117 U/L   Total Bilirubin 0.3  0.3 - 1.2 mg/dL   GFR calc non Af Amer 68 (*) >90 mL/min   GFR calc Af Amer 78  (*) >90 mL/min   Anion gap 14  5 - 15  LIPASE, BLOOD      Result Value Ref Range   Lipase 35  11 - 59 U/L  URINE MICROSCOPIC-ADD ON      Result Value Ref Range   WBC, UA 11-20  <3 WBC/hpf   Bacteria, UA FEW (*) RARE   Dg Abd Acute W/chest 06/08/2014   CLINICAL DATA:  Upper abdominal pain.  Vomiting.  Diarrhea.  EXAM: ACUTE ABDOMEN SERIES (ABDOMEN 2 VIEW & CHEST 1 VIEW)  COMPARISON:  Chest x-ray 07/25/2013. Abdominal radiograph 07/17/2013.  FINDINGS: Lung volumes are normal. No consolidative airspace disease. No pleural effusions. No pneumothorax. No pulmonary nodule or mass noted. Pulmonary vasculature and the cardiomediastinal silhouette are within normal limits.  Gas and stool are seen scattered throughout the colon extending to the level of the rectum. No pathologic distension of small bowel is noted. No gross evidence of pneumoperitoneum.  IMPRESSION: 1.  Nonobstructive bowel gas pattern. 2. No pneumoperitoneum. 3. No radiographic evidence of acute cardiopulmonary disease.   Electronically Signed   By: Trudie Reed M.D.   On: 06/08/2014 20:34     Results for [name], [name] (MRN [id number]) as of 06/08/2014 20:30  Ref. Range 11/27/2013 13:48 12/05/2013 16:05 06/05/2014 13:04 06/08/2014 17:11  BUN Latest Range: 6-23 mg/dL 8 17 9 8   Creatinine Latest Range: 0.50-1.10 mg/dL 7.82 9.56 2.13 0.86 (H)    2040:  Pt continues to have N/V despite multiple doses of IV medications. BUN/Cr elevated compared to previous; IVF continued. VS remain stable. No clear UTI on Udip, UC is pending. UC from previous ED visit on 06/05/14 with multiple bacterial growth. Dx and testing d/w pt and family.  Questions answered.  Verb understanding, agreeable to admit.  T/C to [facility] Dr. Onalee Hua, case discussed, including:  HPI, pertinent PM/SHx, VS/PE, dx testing, ED course and treatment:  Agreeable to admit, requests to write temporary orders, obtain observation medical bed to Dr. Renard Matter' service.    Isabel Anger,  [name] 06/08/14 2147

## 2014-06-08 NOTE — H&P (Signed)
PCP:   Alice Reichert, MD   Chief Complaint:  abd pain  HPI: 28 yo female h/o ibs comes in with one week h/o diarrhea, n/v and epigastric abd pain.  She started with diarrhea for several days nonbloody.  No fevers.  Then started with epigastric abd pain and n/v.  She has been on [place] with her gi doctor and this was stopped 2 months ago.  Her son had also been sick last week but with just diarrhea.  Pt feels better since arrival to ED.  Review of Systems:  Positive and negative as per HPI otherwise all other systems are negative  Past Medical History: Past Medical History  Diagnosis Date  . Herpes   . GERD (gastroesophageal reflux disease)   . Gastritis   . Disk prolapse   . Bulging disc   . Obesity   . Mental disorder     anxiety  . Other and unspecified ovarian cyst 12/01/2013  . Elevated liver enzymes 12/01/2013  . Ovarian cyst, right   . Headache   . Abdominal pain, chronic, right lower quadrant   . Abdominal pain, chronic, epigastric    Past Surgical History  Procedure Laterality Date  . Wisdom tooth extraction    . Esophagogastroduodenoscopy  01/19/2012    ZOX:WRUEAV,[id number] IN THE ANTRUM/HIATAL HERNIA/  . Colonoscopy with esophagogastroduodenoscopy (egd) N/A 02/24/2013    YNW:[id number] RECTAL BLEEDING DUE TO Moderate sized internal hemorrhoids, EGD: erosive esophagitis due to uncontrolled reflux, moderate erosive gastritis, benign path    Medications: Prior to Admission medications   Medication Sig Start Date End Date Taking? Authorizing Provider  ciprofloxacin (CIPRO) 500 MG tablet Take 1 tablet (500 mg total) by mouth 2 (two) times daily. 06/05/14  Yes Vanetta Mulders, MD  DULoxetine (CYMBALTA) 60 MG capsule Take 60 mg by mouth at bedtime.   Yes Historical Provider, MD  HYDROcodone-acetaminophen (NORCO/VICODIN) 5-325 MG per tablet Take 1-2 tablets by mouth every 6 (six) hours as needed. 06/05/14  Yes Vanetta Mulders, MD  norethindrone-ethinyl estradiol  (OVCON-50) 1-50 MG-MCG tablet Take 1 tablet by mouth daily. 01/03/14  Yes Tilda Burrow, MD  ondansetron (ZOFRAN ODT) 4 MG disintegrating tablet Take 1 tablet (4 mg total) by mouth every 8 (eight) hours as needed. 06/05/14  Yes Vanetta Mulders, MD  pantoprazole (PROTONIX) 40 MG tablet Take 40 mg by mouth daily.   Yes Historical Provider, MD  tiZANidine (ZANAFLEX) 2 MG tablet Take 2 mg by mouth at bedtime.    Yes Historical Provider, MD  topiramate (TOPAMAX) 100 MG tablet Take 100 mg by mouth daily.   Yes Historical Provider, MD  traMADol (ULTRAM) 50 MG tablet Take 1 tablet (50 mg total) by mouth every 6 (six) hours as needed for pain. 07/17/13  Yes [name], MD  traZODone (DESYREL) 100 MG tablet Take 200 mg by mouth at bedtime.    Yes Historical Provider, MD  valACYclovir (VALTREX) 1000 MG tablet Take 1,000 mg by mouth at bedtime.   Yes Historical Provider, MD    Allergies:  No Known Allergies  Social History:  reports that she quit smoking about 2 years ago. Her smoking use included Cigarettes. She has a .125 pack-year smoking history. She has never used smokeless tobacco. She reports that she does not drink alcohol or use illicit drugs.  Family History: Family History  Problem Relation Age of Onset  . Diabetes Mother   . Diabetes Other   . Hypertension Other   . Colon cancer Neg Hx   .  Colon polyps Neg Hx   . Diabetes Maternal Grandmother   . COPD Maternal Grandmother   . Diabetes Maternal Grandfather     Physical Exam: Filed Vitals:   06/08/14 1624 06/08/14 1625  BP: 120/99   Pulse: 93   Temp: 98.3 F (36.8 C)   TempSrc: Oral   Height:  5\' 6"  (1.676 m)  Weight:  143.337 kg (316 lb)  SpO2: 99%    General appearance: alert, cooperative and no distress Head: Normocephalic, without obvious abnormality, atraumatic Eyes: negative Nose: Nares normal. Septum midline. Mucosa normal. No drainage or sinus tenderness. Neck: no JVD and supple, symmetrical, trachea  midline Lungs: clear to auscultation bilaterally Heart: regular rate and rhythm, S1, S2 normal, no murmur, click, rub or gallop Abdomen: soft, non-tender; bowel sounds normal; no masses,  no organomegaly Extremities: extremities normal, atraumatic, no cyanosis or edema Pulses: 2+ and symmetric Skin: Skin color, texture, turgor normal. No rashes or lesions Neurologic: Grossly normal  Labs on Admission:   Recent Labs  06/08/14 1711  NA 142  K 3.7  CL 110  CO2 18*  GLUCOSE 94  BUN 8  CREATININE 1.12*  CALCIUM 9.5    Recent Labs  06/08/14 1711  AST 33  ALT 12  ALKPHOS 102  BILITOT 0.3  PROT 7.8  ALBUMIN 3.9    Recent Labs  06/08/14 1711  LIPASE 35    Recent Labs  06/08/14 1711  WBC 7.6  NEUTROABS 5.7  HGB 14.4  HCT 40.6  MCV 90.0  PLT 263   Radiological Exams on Admission: Ct Abdomen Pelvis W Contrast  06/05/2014   CLINICAL DATA:  One week history of epigastric pain and diarrhea  EXAM: CT ABDOMEN AND PELVIS WITH CONTRAST  TECHNIQUE: Multidetector CT imaging of the abdomen and pelvis was performed using the standard protocol following bolus administration of intravenous contrast.  CONTRAST:  100mL OMNIPAQUE IOHEXOL 300 MG/ML SOLN intravenously ; the patient also received oral contrast material.  COMPARISON:  CT scan of the abdomen and pelvis dated November 27, 2013.  FINDINGS: The liver exhibits no focal mass or ductal dilation. The gallbladder is adequately distended with no evidence of stones. The spleen is enlarged with maximal measured dimension of 15 cm with a calculated splenic volume of 956 cc. The pancreas, adrenal glands, and kidneys are normal. The abdominal aorta and surrounding soft tissues are normal.  The stomach is moderately distended with contrast. There is no perigastric inflammatory change or fluid collections. The small bowel is normal. The appendix is not discretely demonstrated. The partially contrast filled colon exhibits no evidence of  obstruction or acute inflammation. The mesenteric fat exhibits no abnormal masses or areas of inflammation. There is no ascites.  The urinary bladder, uterus, and adnexal structures are unremarkable. There is no free pelvic fluid. There is no inguinal or umbilical hernia. The psoas musculature is normal. The lumbar spine and bony pelvis exhibit no acute abnormalities. The lung bases are clear.  IMPRESSION: 1. There is splenomegaly.  There is no lymphadenopathy. 2. There is no acute abnormality of the liver or gallbladder or pancreas. 3. The urinary tract, uterus and adnexal structures, and bowel are unremarkable.   Electronically Signed   By: Kyan Yurkovich  [place]   On: 06/05/2014 16:22   Dg Abd Acute W/chest  06/08/2014   CLINICAL DATA:  Upper abdominal pain.  Vomiting.  Diarrhea.  EXAM: ACUTE ABDOMEN SERIES (ABDOMEN 2 VIEW & CHEST 1 VIEW)  COMPARISON:  Chest x-ray 07/25/2013. Abdominal radiograph  07/17/2013.  FINDINGS: Lung volumes are normal. No consolidative airspace disease. No pleural effusions. No pneumothorax. No pulmonary nodule or mass noted. Pulmonary vasculature and the cardiomediastinal silhouette are within normal limits.  Gas and stool are seen scattered throughout the colon extending to the level of the rectum. No pathologic distension of small bowel is noted. No gross evidence of pneumoperitoneum.  IMPRESSION: 1.  Nonobstructive bowel gas pattern. 2. No pneumoperitoneum. 3. No radiographic evidence of acute cardiopulmonary disease.   Electronically Signed   By: Trudie Reed M.D.   On: 06/08/2014 20:34    Assessment/Plan  28 yo female with n/v/d and abdominal pain  Principal Problem:   Dehydration in setting of gi illness:  ibs flare vs acute vge.   Ivf.  Could be related to her ibs and recent d/c of activia (this helped her for well over a year).  Will consult gi in am to assess the need to restart this medication.    Active Problems:  Stable unless o/w noted   GERD (gastroesophageal  reflux disease)   Gastritis   Abdominal pain, chronic, epigastric   Abdominal pain, acute   Nausea & vomiting  obs on  Med.  Full code.  Deonna Krummel A 06/08/2014, 8:54 PM

## 2014-06-09 LAB — CBC
HEMATOCRIT: 33.8 % — AB (ref 36.0–46.0)
Hemoglobin: 11.5 g/dL — ABNORMAL LOW (ref 12.0–15.0)
MCH: 31.2 pg (ref 26.0–34.0)
MCHC: 34 g/dL (ref 30.0–36.0)
MCV: 91.6 fL (ref 78.0–100.0)
Platelets: 196 10*3/uL (ref 150–400)
RBC: 3.69 MIL/uL — ABNORMAL LOW (ref 3.87–5.11)
RDW: 13.8 % (ref 11.5–15.5)
WBC: 5.3 10*3/uL (ref 4.0–10.5)

## 2014-06-09 LAB — COMPREHENSIVE METABOLIC PANEL
ALK PHOS: 80 U/L (ref 39–117)
ALT: 10 U/L (ref 0–35)
AST: 24 U/L (ref 0–37)
Albumin: 3 g/dL — ABNORMAL LOW (ref 3.5–5.2)
Anion gap: 12 (ref 5–15)
BUN: 7 mg/dL (ref 6–23)
CO2: 18 meq/L — AB (ref 19–32)
Calcium: 8.3 mg/dL — ABNORMAL LOW (ref 8.4–10.5)
Chloride: 113 mEq/L — ABNORMAL HIGH (ref 96–112)
Creatinine, Ser: 1 mg/dL (ref 0.50–1.10)
GFR calc non Af Amer: 78 mL/min — ABNORMAL LOW (ref >90)
GFR, EST AFRICAN AMERICAN: 90 mL/min — AB (ref >90)
GLUCOSE: 89 mg/dL (ref 70–99)
POTASSIUM: 3.4 meq/L — AB (ref 3.7–5.3)
Sodium: 143 mEq/L (ref 137–147)
TOTAL PROTEIN: 6 g/dL (ref 6.0–8.3)
Total Bilirubin: 0.3 mg/dL (ref 0.3–1.2)

## 2014-06-09 MED ORDER — RISAQUAD PO CAPS
1.0000 | ORAL_CAPSULE | Freq: Every day | ORAL | Status: DC
  Administered 2014-06-09 – 2014-06-10 (×2): 1 via ORAL
  Filled 2014-06-09 (×2): qty 1

## 2014-06-09 MED ORDER — PANTOPRAZOLE SODIUM 40 MG PO TBEC
40.0000 mg | DELAYED_RELEASE_TABLET | Freq: Two times a day (BID) | ORAL | Status: DC
  Administered 2014-06-09 – 2014-06-10 (×2): 40 mg via ORAL
  Filled 2014-06-09 (×2): qty 1

## 2014-06-09 NOTE — Progress Notes (Signed)
Subjective: The patient was admitted with nausea vomiting and diarrhea. She is on intravenous fluids and feeling some better but still has upper abdominal discomfort.  Objective: Vital signs in last 24 hours: Temp:  [98.3 F (36.8 C)-98.5 F (36.9 C)] 98.4 F (36.9 C) (07/25 0402) Pulse Rate:  [82-104] 82 (07/25 0402) Resp:  [18] 18 (07/25 0402) BP: (86-121)/(44-99) 110/64 mmHg (07/25 0535) SpO2:  [99 %-100 %] 100 % (07/25 0402) Weight:  [137.7 kg (303 lb 9.2 oz)-143.337 kg (316 lb)] 140.524 kg (309 lb 12.8 oz) (07/25 0402) Weight change:  Last BM Date: 06/08/14  Intake/Output from previous day: 07/24 0701 - 07/25 0700 In: 547.5 [I.V.:547.5] Out: -  Intake/Output this shift:    Physical Exam: General appearance; the patient is alert and oriented  H. EENT negative  Neck supple no JVD or thyroid abnormalities  Lungs clear to P&A  Heart regular rhythm no murmurs  Abdomen the palpable organs or masses no organomegaly  Extremities free of edema   Recent Labs  06/08/14 1711 06/09/14 0546  WBC 7.6 5.3  HGB 14.4 11.5*  HCT 40.6 33.8*  PLT 263 196   BMET  Recent Labs  06/08/14 1711 06/09/14 0546  NA 142 143  K 3.7 3.4*  CL 110 113*  CO2 18* 18*  GLUCOSE 94 89  BUN 8 7  CREATININE 1.12* 1.00  CALCIUM 9.5 8.3*    Studies/Results: Dg Abd Acute W/chest  06/08/2014   CLINICAL DATA:  Upper abdominal pain.  Vomiting.  Diarrhea.  EXAM: ACUTE ABDOMEN SERIES (ABDOMEN 2 VIEW & CHEST 1 VIEW)  COMPARISON:  Chest x-ray 07/25/2013. Abdominal radiograph 07/17/2013.  FINDINGS: Lung volumes are normal. No consolidative airspace disease. No pleural effusions. No pneumothorax. No pulmonary nodule or mass noted. Pulmonary vasculature and the cardiomediastinal silhouette are within normal limits.  Gas and stool are seen scattered throughout the colon extending to the level of the rectum. No pathologic distension of small bowel is noted. No gross evidence of pneumoperitoneum.   IMPRESSION: 1.  Nonobstructive bowel gas pattern. 2. No pneumoperitoneum. 3. No radiographic evidence of acute cardiopulmonary disease.   Electronically Signed   By: Trudie Reedaniel  Entrikin M.D.   On: 06/08/2014 20:34    Medications:  . sodium chloride   Intravenous STAT  . ciprofloxacin  500 mg Oral BID  . DULoxetine  60 mg Oral QHS  . pantoprazole  40 mg Oral q morning - 10a  . tiZANidine  2 mg Oral QHS  . topiramate  100 mg Oral q morning - 10a  . traZODone  200 mg Oral QHS    . sodium chloride 75 mL/hr at 06/09/14 0117     Assessment/Plan: 1. Dehydration, low serum potassium, gastroenteritis plan to continue current the IV fluids-we'll replace potassium. Continue ciprofloxacin   LOS: 1 day   Geet Hosking G 06/09/2014, 7:26 AM

## 2014-06-09 NOTE — Progress Notes (Signed)
Patients nausea improved after zofran. No complaints voiced at this time.

## 2014-06-09 NOTE — Consult Note (Addendum)
Referring Provider: No ref. provider found Primary Care Physician:  Alice Reichert, MD Primary Gastroenterologist:  Jonette Eva  Reason for Consultation:  ABDOMINAL PAIN, NAUSEA, VOMITING   Impression: 28 YO FEMALE SEEN IN ED JUL 21 FOR NAUSEA/DIARRHEA AND RX FOR UTI W/ CIPRO. RETURNED 7/24 DUE TO VOMITING. ABDOMINAL PAIN IMPROVED. LAST WATERY STOOL/VOMITING YESTERDAY. SX MOST LIKELY DUE TO UTI/VIRAL GASTROENTERITIS. STOPPED AMITIZA BECAUSE SHE WAS NOT HAVING CONSTIPATION ANYMORE. PT CLINICALLY IMPROVED AND TOLERATING POs.  Plan: 1. FULL LIQUID DIET & ADVANCE AS TOLERATED TO LACTOSE FREE DYS 3 DIET 2. BID PPI 3. HOLD AMITIZA. CONTINUE TO MANAGE CONSTIPATION WITH DIET. 4. OPV IN OCT 2015 W/ DR. Elina Streng.    HPI:  RECORDS REVIEWED FROM 2014 TO PRESENT. KNOWN TO OUR PRACTICE. LAST VISIT SEP 2014. NO SHOW FOR APPT MAR 2015.   PT'S SON HAD GI ILLNESS SUN. IT LASTED 24 HRS. SHE STARTED TO HAVE NAUSEA AND DIARRHEA 7/21. SEE [facility] ED-CT NAIAP. Dx; UTI AND D/C WITH CIP Rx. ABDOMINAL PAIN IMPROVED BUT WATERY STOOLS(2-3/DAY) CONTINUED AND VOMITING STARTED 7/24. RETURNED TO ED DUE TO VOMITING. ADMITTED WITH LOW BP/DEHYDRATION. SUBJECTIVE CHILLS. WAS HAVING UPPER ABDOMINAL PAIN, BUT NOW RESOLVED. INTENTIONAL WEIGHT LOSS:  SEP 2014 334 LBS.  PT DENIES FEVER, HEMATOCHEZIA, HEMATEMESIS, melena,  CHEST PAIN, SHORTNESS OF BREATH,  constipation, problems swallowing, OR heartburn or indigestion.  Past Medical History  Diagnosis Date  . Herpes   . GERD (gastroesophageal reflux disease)   . Gastritis   . Disk prolapse   . Bulging disc   . Obesity   . Mental disorder     anxiety  . Other and unspecified ovarian cyst 12/01/2013  . Elevated liver enzymes 12/01/2013  . Ovarian cyst, right   . Headache   . Abdominal pain, chronic, right lower quadrant   . Abdominal pain, chronic, epigastric     Past Surgical History  Procedure Laterality Date  . Wisdom tooth extraction    . Esophagogastroduodenoscopy   01/19/2012    ZOX:[id number],[id number] IN THE ANTRUM/HIATAL HERNIA/  . Colonoscopy with esophagogastroduodenoscopy (egd) N/A 02/24/2013    YNW:[id number] RECTAL BLEEDING DUE TO Moderate sized internal hemorrhoids, EGD: erosive esophagitis due to uncontrolled reflux, moderate erosive gastritis, benign path    Prior to Admission medications   Medication Sig Start Date End Date Taking? Authorizing Provider  ciprofloxacin (CIPRO) 500 MG tablet Take 1 tablet (500 mg total) by mouth 2 (two) times daily. 06/05/14  Yes Vanetta Mulders, MD  DULoxetine (CYMBALTA) 60 MG capsule Take 60 mg by mouth at bedtime.   Yes Historical Provider, MD  HYDROcodone-acetaminophen (NORCO/VICODIN) 5-325 MG per tablet Take 1-2 tablets by mouth every 6 (six) hours as needed. 06/05/14  Yes Vanetta Mulders, MD  norethindrone-ethinyl estradiol (OVCON-50) 1-50 MG-MCG tablet Take 1 tablet by mouth daily. 01/03/14  Yes Tilda Burrow, MD  ondansetron (ZOFRAN ODT) 4 MG disintegrating tablet Take 1 tablet (4 mg total) by mouth every 8 (eight) hours as needed. 06/05/14  Yes Vanetta Mulders, MD  pantoprazole (PROTONIX) 40 MG tablet Take 40 mg by mouth every morning.    Yes Historical Provider, MD  tiZANidine (ZANAFLEX) 2 MG tablet Take 2 mg by mouth at bedtime.    Yes Historical Provider, MD  topiramate (TOPAMAX) 100 MG tablet Take 100 mg by mouth every morning.    Yes Historical Provider, MD  traMADol (ULTRAM) 50 MG tablet Take 1 tablet (50 mg total) by mouth every 6 (six) hours as needed for pain. 07/17/13  Yes Christopher J.  Pollina, MD  traZODone (DESYREL) 100 MG tablet Take 200 mg by mouth at bedtime.    Yes Historical Provider, MD  valACYclovir (VALTREX) 1000 MG tablet Take 1,000 mg by mouth at bedtime.   Yes Historical Provider, MD    Current Facility-Administered Medications  Medication Dose Route Frequency Provider Last Rate Last Dose  . 0.9 %  sodium chloride infusion   Intravenous STAT Laray AngerKathleen M McManus, DO 150 mL/hr at 06/09/14  1111    . alum & mag hydroxide-simeth (MAALOX/MYLANTA) 200-200-20 MG/5ML suspension 30 mL  30 mL Oral Q6H PRN Haydee Monicaachal A David, MD      . ciprofloxacin (CIPRO) tablet 500 mg  500 mg Oral BID Haydee Monicaachal A David, MD   500 mg at 06/09/14 0908  . DULoxetine (CYMBALTA) DR capsule 60 mg  60 mg Oral QHS Haydee Monicaachal A David, MD   60 mg at 06/08/14 2239  . morphine 2 MG/ML injection 1 mg  1 mg Intravenous Q4H PRN Haydee Monicaachal A David, MD   1 mg at 06/09/14 0740  . ondansetron (ZOFRAN) tablet 4 mg  4 mg Oral Q6H PRN Haydee Monicaachal A David, MD       Or  . ondansetron [facility](ZOFRAN) injection 4 mg  4 mg Intravenous Q6H PRN Haydee Monicaachal A David, MD   4 mg at 06/09/14 0740  . pantoprazole (PROTONIX) EC tablet 40 mg  40 mg Oral q morning - 10a Haydee Monicaachal A David, MD   40 mg at 06/08/14 2240  . tiZANidine (ZANAFLEX) tablet 2 mg  2 mg Oral QHS Haydee Monicaachal A David, MD   2 mg at 06/08/14 2240  . topiramate (TOPAMAX) tablet 100 mg  100 mg Oral q morning - 10a Haydee Monicaachal A David, MD   100 mg at 06/08/14 2215  . traZODone (DESYREL) tablet 200 mg  200 mg Oral QHS Haydee Monicaachal A David, MD   200 mg at 06/08/14 2240    Allergies as of 06/08/2014  . (No Known Allergies)    Family History  Problem Relation Age of Onset  . Diabetes Mother   . Diabetes Other   . Hypertension Other   . Colon cancer Neg Hx   . Colon polyps Neg Hx   . Diabetes Maternal Grandmother   . COPD Maternal Grandmother   . Diabetes Maternal Grandfather      History   Social History  . Marital Status: Single    Spouse Name: N/A    Number of Children: 1  . Years of Education: N/A   Occupational History  .    [employer]. Unemployed    Social History Main Topics  . Smoking status: Former Smoker -- 0.25 packs/day for .5 years    Types: Cigarettes    Quit date: 07/28/2011  . Smokeless tobacco: Never Used  . Alcohol Use: No  . Drug Use: No  . Sexual Activity: Not Currently    Birth Control/ Protection: None   Review of Systems: PER HPI OTHERWISE ALL SYSTEMS ARE NEGATIVE.   Vitals: Blood  pressure 110/64, pulse 82, temperature 98.4 F (36.9 C), temperature source Oral, resp. rate 18, height 5\' 6"  (1.676 m), weight 309 lb 12.8 oz (140.524 kg), last menstrual period 05/21/2014, SpO2 100.00%.  Physical Exam: General:   Alert,  Well-developed, well-nourished, pleasant and cooperative in NAD Head:  Normocephalic and atraumatic. Eyes:  Sclera clear, no icterus.   Conjunctiva pink. Neck:  Supple; no masses. Lungs:  Clear throughout to auscultation.   No wheezes. No acute distress. Heart:  Regular  rate and rhythm; no murmurs, clicks, rubs,  or gallops. Abdomen:  Soft, nontender and nondistended. No masses  noted. Normal bowel sounds, without guarding, and without rebound.  OBESE Msk:  Symmetrical without gross deformities. Normal posture. Extremities:  Without edema. Neurologic:  Alert and  oriented x4;  grossly normal neurologically. Cervical Nodes:  No significant cervical adenopathy. Psych:  Alert and cooperative. Normal mood and affect.  Lab Results:  Recent Labs  06/08/14 1711 06/09/14 0546  WBC 7.6 5.3  HGB 14.4 11.5*  HCT 40.6 33.8*  PLT 263 196   BMET  Recent Labs  06/08/14 1711 06/09/14 0546  NA 142 143  K 3.7 3.4*  CL 110 113*  CO2 18* 18*  GLUCOSE 94 89  BUN 8 7  CREATININE 1.12* 1.00  CALCIUM 9.5 8.3*   LFT  Recent Labs  06/09/14 0546  PROT 6.0  ALBUMIN 3.0*  AST 24  ALT 10  ALKPHOS 80  BILITOT 0.3     Studies/Results: JUL 2015: CT ABD/PELVIS-NO ACUTE ABDOMINAL PROCESS(NAIAP), AAS: NAIAP    LOS: 1 day   Elgin Carn  06/09/2014, 11:28 AM

## 2014-06-10 ENCOUNTER — Telehealth: Payer: Self-pay | Admitting: Gastroenterology

## 2014-06-10 NOTE — Telephone Encounter (Signed)
OPV E30 OCT 2015 CONSTIPATION/GERD

## 2014-06-10 NOTE — Progress Notes (Signed)
Patient with orders to be discharged home. Discharge instructions given, patient verbalized understanding. Patient stable. Patient left in private vehicle with mother.

## 2014-06-10 NOTE — Discharge Summary (Signed)
Physician Discharge Summary  [name]:[id number] DOB: 1985/11/20 DOA: 06/08/2014  PCP: Alice Reichert, MD  Admit date: 06/08/2014 Discharge date: 06/10/2014     Discharge Diagnoses:  1. Dehydration gastroenteritis 2. Urinary tract infection no organism identified 3. GERD-gastritis 4. Low serum potassium 5.   Discharge Condition: Stable Disposition: Home Diet recommendation: Advance diet to regular diet  Filed Weights   06/08/14 2157 06/09/14 0402 06/10/14 0501  Weight: 137.7 kg (303 lb 9.2 oz) 140.524 kg (309 lb 12.8 oz) 141.568 kg (312 lb 1.6 oz)    History of present illness:  The patient was admitted through the emergency department with a history of nausea vomiting and diarrhea over a period of several days. She was started on intravenous fluids and emergency department and subsequently was admitted. Her son had been sick last week with diarrhea.  Hospital Course:  The patient was started on intravenous fluids on admission. She was given clear liquids by mouth she was given Zofran as needed for nausea and was continued on Cipro 500 mg twice a day. His treatment for urinary tract infection which he had on admission. She was seen by GI service. Supportive care was recommended. The patient had CT of the abdomen on admission which did not show acute process. She was continued on medications listed below and gradually improved. He was felt she could be discharged.    Medication List         ciprofloxacin 500 MG tablet  Commonly known as:  CIPRO  Take 1 tablet (500 mg total) by mouth 2 (two) times daily.     DULoxetine 60 MG capsule  Commonly known as:  CYMBALTA  Take 60 mg by mouth at bedtime.     HYDROcodone-acetaminophen 5-325 MG per tablet  Commonly known as:  NORCO/VICODIN  Take 1-2 tablets by mouth every 6 (six) hours as needed.     norethindrone-ethinyl estradiol 1-50 MG-MCG tablet  Commonly known as:  OVCON-50  Take 1 tablet by mouth daily.      ondansetron 4 MG disintegrating tablet  Commonly known as:  ZOFRAN ODT  Take 1 tablet (4 mg total) by mouth every 8 (eight) hours as needed.     pantoprazole 40 MG tablet  Commonly known as:  PROTONIX  Take 40 mg by mouth every morning.     tiZANidine 2 MG tablet  Commonly known as:  ZANAFLEX  Take 2 mg by mouth at bedtime.     topiramate 100 MG tablet  Commonly known as:  TOPAMAX  Take 100 mg by mouth every morning.     traMADol 50 MG tablet  Commonly known as:  ULTRAM  Take 1 tablet (50 mg total) by mouth every 6 (six) hours as needed for pain.     traZODone 100 MG tablet  Commonly known as:  DESYREL  Take 200 mg by mouth at bedtime.     valACYclovir 1000 MG tablet  Commonly known as:  VALTREX  Take 1,000 mg by mouth at bedtime.       No Known Allergies  The results of significant diagnostics from this hospitalization (including imaging, microbiology, ancillary and laboratory) are listed below for reference.    Significant Diagnostic Studies: Ct Abdomen Pelvis W Contrast  06/05/2014   CLINICAL DATA:  One week history of epigastric pain and diarrhea  EXAM: CT ABDOMEN AND PELVIS WITH CONTRAST  TECHNIQUE: Multidetector CT imaging of the abdomen and pelvis was performed using the standard protocol following bolus administration of intravenous  contrast.  CONTRAST:  100mL OMNIPAQUE IOHEXOL 300 MG/ML SOLN intravenously ; the patient also received oral contrast material.  COMPARISON:  CT scan of the abdomen and pelvis dated November 27, 2013.  FINDINGS: The liver exhibits no focal mass or ductal dilation. The gallbladder is adequately distended with no evidence of stones. The spleen is enlarged with maximal measured dimension of 15 cm with a calculated splenic volume of 956 cc. The pancreas, adrenal glands, and kidneys are normal. The abdominal aorta and surrounding soft tissues are normal.  The stomach is moderately distended with contrast. There is no perigastric inflammatory change or  fluid collections. The small bowel is normal. The appendix is not discretely demonstrated. The partially contrast filled colon exhibits no evidence of obstruction or acute inflammation. The mesenteric fat exhibits no abnormal masses or areas of inflammation. There is no ascites.  The urinary bladder, uterus, and adnexal structures are unremarkable. There is no free pelvic fluid. There is no inguinal or umbilical hernia. The psoas musculature is normal. The lumbar spine and bony pelvis exhibit no acute abnormalities. The lung bases are clear.  IMPRESSION: 1. There is splenomegaly.  There is no lymphadenopathy. 2. There is no acute abnormality of the liver or gallbladder or pancreas. 3. The urinary tract, uterus and adnexal structures, and bowel are unremarkable.   Electronically Signed   By: David  [place]   On: 06/05/2014 16:22   Dg Abd Acute W/chest  06/08/2014   CLINICAL DATA:  Upper abdominal pain.  Vomiting.  Diarrhea.  EXAM: ACUTE ABDOMEN SERIES (ABDOMEN 2 VIEW & CHEST 1 VIEW)  COMPARISON:  Chest x-ray 07/25/2013. Abdominal radiograph 07/17/2013.  FINDINGS: Lung volumes are normal. No consolidative airspace disease. No pleural effusions. No pneumothorax. No pulmonary nodule or mass noted. Pulmonary vasculature and the cardiomediastinal silhouette are within normal limits.  Gas and stool are seen scattered throughout the colon extending to the level of the rectum. No pathologic distension of small bowel is noted. No gross evidence of pneumoperitoneum.  IMPRESSION: 1.  Nonobstructive bowel gas pattern. 2. No pneumoperitoneum. 3. No radiographic evidence of acute cardiopulmonary disease.   Electronically Signed   By: Trudie Reedaniel  Entrikin M.D.   On: 06/08/2014 20:34    Microbiology: Recent Results (from the past 240 hour(s))  URINE CULTURE     Status: None   Collection Time    06/05/14  2:20 PM      Result Value Ref Range Status   Specimen Description URINE, CLEAN CATCH   Final   Special Requests NONE    Final   Culture  Setup Time     Final   Value: 06/06/2014 01:11     Performed at [facility]   Colony Count     Final   Value: 20,OOO COLONIES/ML     Performed at [facility]   Culture     Final   Value: Multiple bacterial morphotypes present, none predominant. Suggest appropriate recollection if clinically indicated.     Performed at [facility]   Report Status 06/06/2014 FINAL   Final     Labs: Basic Metabolic Panel:  Recent Labs Lab 06/05/14 1304 06/08/14 1711 06/09/14 0546  NA 140 142 143  K 4.0 3.7 3.4*  CL 108 110 113*  CO2 19 18* 18*  GLUCOSE 82 94 89  BUN 9 8 7   CREATININE 0.96 1.12* 1.00  CALCIUM 9.1 9.5 8.3*   Liver Function Tests:  Recent Labs Lab 06/05/14 1304 06/08/14 1711 06/09/14 0546  AST 25 33 24  ALT 10 12 10   ALKPHOS 104 102 80  BILITOT 0.4 0.3 0.3  PROT 7.4 7.8 6.0  ALBUMIN 3.6 3.9 3.0*    Recent Labs Lab 06/05/14 1304 06/08/14 1711  LIPASE 36 35   No results found for this basename: AMMONIA,  in the last 168 hours CBC:  Recent Labs Lab 06/05/14 1304 06/08/14 1711 06/09/14 0546  WBC 7.2 7.6 5.3  NEUTROABS 5.0 5.7  --   HGB 13.0 14.4 11.5*  HCT 37.6 40.6 33.8*  MCV 90.6 90.0 91.6  PLT 214 263 196   Cardiac Enzymes: No results found for this basename: CKTOTAL, CKMB, CKMBINDEX, TROPONINI,  in the last 168 hours BNP: BNP (last 3 results) No results found for this basename: PROBNP,  in the last 8760 hours CBG: No results found for this basename: GLUCAP,  in the last 168 hours  Principal Problem:   Dehydration Active Problems:   GERD (gastroesophageal reflux disease)   Gastritis   Abdominal pain, chronic, epigastric   Abdominal pain, acute   Nausea & vomiting   Intractable nausea and vomiting   Time coordinating discharge: 30 minutes Signed:  Butch Penny, MD 06/10/2014, 6:58 AM

## 2014-06-13 NOTE — Telephone Encounter (Signed)
Reminder in epic °

## 2014-06-19 ENCOUNTER — Telehealth: Payer: Self-pay

## 2014-06-19 NOTE — Telephone Encounter (Signed)
Pt called and said she is having some rectal bleed for the last couple of days. Several times through the day. Bright red and dark red. Has hx of hemorrhoids, please advise!

## 2014-06-20 MED ORDER — HYDROCORTISONE ACETATE 25 MG RE SUPP: 25.0000 mg | Freq: Two times a day (BID) | RECTAL | Status: DC

## 2014-06-20 NOTE — Telephone Encounter (Signed)
PLEASE CALL PT. SHE SHOULD USE ANUSOL SUPP FOR 12 DAYS AND REPEAT ANOTHER COURSE IF HER RECTAL BLEEDING IS NOT RESOLVED. SHE SHOULD SEE SURGERY FOR A HEMORRHOIDECTOMY. SHE CAN GO TO DR. Lovell SheehanJENKINS OR [facility].

## 2014-06-20 NOTE — Telephone Encounter (Signed)
LMOM to call.

## 2014-06-20 NOTE — Telephone Encounter (Signed)
Pt left VM for a call and I returned her call and left VM.

## 2014-06-22 ENCOUNTER — Other Ambulatory Visit: Payer: Self-pay | Admitting: Gastroenterology

## 2014-06-22 ENCOUNTER — Other Ambulatory Visit: Payer: Self-pay | Admitting: Adult Health

## 2014-06-23 ENCOUNTER — Encounter (HOSPITAL_COMMUNITY): Payer: Self-pay | Admitting: Emergency Medicine

## 2014-06-23 ENCOUNTER — Emergency Department (HOSPITAL_COMMUNITY)
Admission: EM | Admit: 2014-06-23 | Discharge: 2014-06-23 | Disposition: A | Discharge status: Home / Self Care | Payer: Medicaid Other | Attending: Emergency Medicine | Admitting: Emergency Medicine

## 2014-06-23 DIAGNOSIS — N3000 Acute cystitis without hematuria: Secondary | ICD-10-CM | POA: Diagnosis not present

## 2014-06-23 DIAGNOSIS — E669 Obesity, unspecified: Secondary | ICD-10-CM | POA: Diagnosis not present

## 2014-06-23 DIAGNOSIS — Z8739 Personal history of other diseases of the musculoskeletal system and connective tissue: Secondary | ICD-10-CM | POA: Insufficient documentation

## 2014-06-23 DIAGNOSIS — G8929 Other chronic pain: Secondary | ICD-10-CM | POA: Insufficient documentation

## 2014-06-23 DIAGNOSIS — Z3202 Encounter for pregnancy test, result negative: Secondary | ICD-10-CM | POA: Insufficient documentation

## 2014-06-23 DIAGNOSIS — R300 Dysuria: Secondary | ICD-10-CM | POA: Diagnosis present

## 2014-06-23 DIAGNOSIS — IMO0002 Reserved for concepts with insufficient information to code with codable children: Secondary | ICD-10-CM | POA: Insufficient documentation

## 2014-06-23 DIAGNOSIS — Z79899 Other long term (current) drug therapy: Secondary | ICD-10-CM | POA: Insufficient documentation

## 2014-06-23 DIAGNOSIS — K2990 Gastroduodenitis, unspecified, without bleeding: Secondary | ICD-10-CM

## 2014-06-23 DIAGNOSIS — Z87891 Personal history of nicotine dependence: Secondary | ICD-10-CM | POA: Insufficient documentation

## 2014-06-23 DIAGNOSIS — Z8619 Personal history of other infectious and parasitic diseases: Secondary | ICD-10-CM | POA: Diagnosis not present

## 2014-06-23 DIAGNOSIS — K2970 Gastritis, unspecified, without bleeding: Secondary | ICD-10-CM | POA: Diagnosis not present

## 2014-06-23 DIAGNOSIS — K219 Gastro-esophageal reflux disease without esophagitis: Secondary | ICD-10-CM | POA: Insufficient documentation

## 2014-06-23 DIAGNOSIS — Z8742 Personal history of other diseases of the female genital tract: Secondary | ICD-10-CM | POA: Diagnosis not present

## 2014-06-23 DIAGNOSIS — F411 Generalized anxiety disorder: Secondary | ICD-10-CM | POA: Diagnosis not present

## 2014-06-23 LAB — CBC WITH DIFFERENTIAL/PLATELET
Basophils Absolute: 0 10*3/uL (ref 0.0–0.1)
Basophils Relative: 0 % (ref 0–1)
Eosinophils Absolute: 0.1 10*3/uL (ref 0.0–0.7)
Eosinophils Relative: 1 % (ref 0–5)
HCT: 39.7 % (ref 36.0–46.0)
HEMOGLOBIN: 14 g/dL (ref 12.0–15.0)
LYMPHS ABS: 2.4 10*3/uL (ref 0.7–4.0)
Lymphocytes Relative: 31 % (ref 12–46)
MCH: 31.7 pg (ref 26.0–34.0)
MCHC: 35.3 g/dL (ref 30.0–36.0)
MCV: 90 fL (ref 78.0–100.0)
MONOS PCT: 7 % (ref 3–12)
Monocytes Absolute: 0.5 10*3/uL (ref 0.1–1.0)
NEUTROS ABS: 4.9 10*3/uL (ref 1.7–7.7)
NEUTROS PCT: 61 % (ref 43–77)
PLATELETS: 240 10*3/uL (ref 150–400)
RBC: 4.41 MIL/uL (ref 3.87–5.11)
RDW: 14.1 % (ref 11.5–15.5)
WBC: 7.9 10*3/uL (ref 4.0–10.5)

## 2014-06-23 LAB — WET PREP, GENITAL
Trich, Wet Prep: NONE SEEN
YEAST WET PREP: NONE SEEN

## 2014-06-23 LAB — URINALYSIS, ROUTINE W REFLEX MICROSCOPIC
BILIRUBIN URINE: NEGATIVE
GLUCOSE, UA: NEGATIVE mg/dL
Ketones, ur: NEGATIVE mg/dL
Nitrite: NEGATIVE
Specific Gravity, Urine: >1.03 — ABNORMAL HIGH (ref 1.005–1.030)
Urobilinogen, UA: 0.2 mg/dL (ref 0.0–1.0)
pH: 6 (ref 5.0–8.0)

## 2014-06-23 LAB — BASIC METABOLIC PANEL
Anion gap: 16 — ABNORMAL HIGH (ref 5–15)
BUN: 8 mg/dL (ref 6–23)
CHLORIDE: 108 meq/L (ref 96–112)
CO2: 19 mEq/L (ref 19–32)
Calcium: 9.2 mg/dL (ref 8.4–10.5)
Creatinine, Ser: 1.01 mg/dL (ref 0.50–1.10)
GFR, EST AFRICAN AMERICAN: 89 mL/min — AB (ref >90)
GFR, EST NON AFRICAN AMERICAN: 77 mL/min — AB (ref >90)
Glucose, Bld: 86 mg/dL (ref 70–99)
POTASSIUM: 3.5 meq/L — AB (ref 3.7–5.3)
SODIUM: 143 meq/L (ref 137–147)

## 2014-06-23 LAB — URINE MICROSCOPIC-ADD ON

## 2014-06-23 LAB — PREGNANCY, URINE: Preg Test, Ur: NEGATIVE

## 2014-06-23 MED ORDER — SODIUM CHLORIDE 0.9 % IV BOLUS (SEPSIS)
1000.0000 mL | Freq: Once | INTRAVENOUS | Status: AC
  Administered 2014-06-23: 1000 mL via INTRAVENOUS

## 2014-06-23 MED ORDER — CEPHALEXIN 500 MG PO CAPS: 500.0000 mg | ORAL_CAPSULE | Freq: Four times a day (QID) | ORAL | Status: DC

## 2014-06-23 MED ORDER — HYDROMORPHONE HCL PF 1 MG/ML IJ SOLN
1.0000 mg | Freq: Once | INTRAMUSCULAR | Status: AC
  Administered 2014-06-23: 1 mg via INTRAVENOUS
  Filled 2014-06-23: qty 1

## 2014-06-23 MED ORDER — OXYCODONE-ACETAMINOPHEN 5-325 MG PO TABS: 1.0000 | ORAL_TABLET | Freq: Four times a day (QID) | ORAL | Status: DC | PRN

## 2014-06-23 NOTE — ED Provider Notes (Addendum)
CSN: [id number]     Arrival date & time 06/23/14  1737 History   First MD Initiated Contact with Patient 06/23/14 1748     Chief Complaint  Patient presents with  . Dysuria     (Consider location/radiation/quality/duration/timing/severity/associated sxs/prior Treatment) HPI Comments: Patient is a 28 year old female with history of obesity, recently treated for urinary tract infection. She was admitted to the hospital for this and discharged approximately 5 days ago. She states since that time her symptoms had worsened. She describes vaginal burning and irritation. She denies fevers or chills. She denies any bowel complaints.  Patient is a 28 y.o. female presenting with dysuria. The history is provided by the patient.  Dysuria Pain quality:  Burning Pain severity:  Moderate Onset quality:  Gradual Duration:  3 days Timing:  Constant Progression:  Worsening Chronicity:  New Recent urinary tract infections: yes   Relieved by:  Nothing Worsened by:  Nothing tried Ineffective treatments:  None tried Associated symptoms: abdominal pain   Associated symptoms: no nausea and no vomiting     Past Medical History  Diagnosis Date  . Herpes   . GERD (gastroesophageal reflux disease)   . Gastritis   . Disk prolapse   . Bulging disc   . Obesity   . Mental disorder     anxiety  . Other and unspecified ovarian cyst 12/01/2013  . Elevated liver enzymes 12/01/2013  . Ovarian cyst, right   . Headache   . Abdominal pain, chronic, right lower quadrant   . Abdominal pain, chronic, epigastric    Past Surgical History  Procedure Laterality Date  . Wisdom tooth extraction    . Esophagogastroduodenoscopy  01/19/2012    QQV:ZDGLOV,[id number]:ULCERS,MULTIPLE IN THE ANTRUM/HIATAL HERNIA/  . Colonoscopy with esophagogastroduodenoscopy (egd) N/A 02/24/2013    JOA:[id number]:INTERMITTENT RECTAL BLEEDING DUE TO Moderate sized internal hemorrhoids, EGD: erosive esophagitis due to uncontrolled reflux, moderate erosive gastritis,  benign path   Family History  Problem Relation Age of Onset  . Diabetes Mother   . Diabetes Other   . Hypertension Other   . Colon cancer Neg Hx   . Colon polyps Neg Hx   . Diabetes Maternal Grandmother   . COPD Maternal Grandmother   . Diabetes Maternal Grandfather    History  Substance Use Topics  . Smoking status: Former Smoker -- 0.25 packs/day for .5 years    Types: Cigarettes    Quit date: 07/28/2011  . Smokeless tobacco: Never Used  . Alcohol Use: No   OB History   Grav Para Term Preterm Abortions TAB SAB Ect Mult Living   1 1 1       1      Review of Systems  Gastrointestinal: Positive for abdominal pain. Negative for nausea and vomiting.  Genitourinary: Positive for dysuria.  All other systems reviewed and are negative.     Allergies  Review of patient's allergies indicates no known allergies.  Home Medications   Prior to Admission medications   Medication Sig Start Date End Date Taking? Authorizing Provider  DULoxetine (CYMBALTA) 60 MG capsule Take 60 mg by mouth at bedtime.   Yes Historical Provider, MD  norethindrone-ethinyl estradiol (OVCON-50) 1-50 MG-MCG tablet Take 1 tablet by mouth daily. 01/03/14  Yes Tilda BurrowJohn Ferguson V, MD  pantoprazole (PROTONIX) 40 MG tablet Take 40 mg by mouth every morning.    Yes Historical Provider, MD  tiZANidine (ZANAFLEX) 2 MG tablet Take 2 mg by mouth at bedtime.    Yes Historical Provider, MD  topiramate (TOPAMAX) 100 MG tablet Take 100 mg by mouth every morning.    Yes Historical Provider, MD  traZODone (DESYREL) 100 MG tablet Take 200 mg by mouth at bedtime.    Yes Historical Provider, MD  valACYclovir (VALTREX) 1000 MG tablet Take 1,000 mg by mouth at bedtime.   Yes Historical Provider, MD  hydrocortisone (ANUSOL-HC) 25 MG suppository Place 1 suppository (25 mg total) rectally every 12 (twelve) hours. For 12 days 06/20/14   [place], MD   BP 149/89  Pulse 97  Temp(Src) 98.5 F (36.9 C) (Oral)  Resp 20  Ht 5\' 6"   (1.676 m)  Wt 311 lb (141.069 kg)  BMI 50.22 kg/m2  SpO2 100%  LMP 05/21/2014 Physical Exam  Nursing note and vitals reviewed. Constitutional: She is oriented to person, place, and time. She appears well-developed and well-nourished. No distress.  HENT:  Head: Normocephalic and atraumatic.  Neck: Normal range of motion. Neck supple.  Cardiovascular: Normal rate and regular rhythm.  Exam reveals no gallop and no friction rub.   No murmur heard. Pulmonary/Chest: Effort normal and breath sounds normal. No respiratory distress. She has no wheezes.  Abdominal: Soft. Bowel sounds are normal. She exhibits no distension. There is no tenderness.  Genitourinary: Uterus normal. Vaginal discharge found.  Is a slight whitish discharge present. Cervix appears normal. There is no cervical motion tenderness and there are no adnexal masses to my exam. Please note exam is difficult due to body habitus.  Musculoskeletal: Normal range of motion.  Neurological: She is alert and oriented to person, place, and time.  Skin: Skin is warm and dry. She is not diaphoretic.    ED Course  Procedures (including critical care time) Labs Review Labs Reviewed  WET PREP, GENITAL - Abnormal; Notable for the following:    Clue Cells Wet Prep HPF POC FEW (*)    WBC, Wet Prep HPF POC MODERATE (*)    All other components within normal limits  URINALYSIS, ROUTINE W REFLEX MICROSCOPIC - Abnormal; Notable for the following:    APPearance CLOUDY (*)    Specific Gravity, Urine >1.030 (*)    Hgb urine dipstick TRACE (*)    Protein, ur TRACE (*)    Leukocytes, UA SMALL (*)    All other components within normal limits  URINE MICROSCOPIC-ADD ON - Abnormal; Notable for the following:    Squamous Epithelial / LPF FEW (*)    Bacteria, UA FEW (*)    All other components within normal limits  BASIC METABOLIC PANEL - Abnormal; Notable for the following:    Potassium 3.5 (*)    GFR calc non Af Amer 77 (*)    GFR calc Af Amer 89  (*)    Anion gap 16 (*)    All other components within normal limits  GC/CHLAMYDIA PROBE AMP  PREGNANCY, URINE  CBC WITH DIFFERENTIAL  HIV ANTIBODY (ROUTINE TESTING)    Imaging Review No results found.   EKG Interpretation None       MDM   Final diagnoses:  None    Urinalysis reveals white cells and small leukocytes. I will treat with a different antibiotic for this. Wet prep reveals moderate white cells and few clue cells. I will hold off on antibiotics for this pending cultures. She has no white count or fever. I doubt there is an acute process such as appendicitis or ovarian torsion. A CT scan was performed less than 2 weeks ago and revealed no evidence for acute  intra-abdominal pathology. I do not feel as though another is indicated. She will be discharged to home to return as needed.    Geoffery Lyons, MD 06/23/14 [id number]  Geoffery Lyons, MD 06/23/14 1949

## 2014-06-23 NOTE — ED Notes (Signed)
Pt reports painful urination x1 week. Did not report this to her PCP on 06/20/14.

## 2014-06-23 NOTE — Discharge Instructions (Signed)
Keflex as prescribed.  We'll call you if your cultures indicate you require further treatment.   Urinary Tract Infection Urinary tract infections (UTIs) can develop anywhere along your urinary tract. Your urinary tract is your body's drainage system for removing wastes and extra water. Your urinary tract includes two kidneys, two ureters, a bladder, and a urethra. Your kidneys are a pair of bean-shaped organs. Each kidney is about the size of your fist. They are located below your ribs, one on each side of your spine. CAUSES Infections are caused by microbes, which are microscopic organisms, including fungi, viruses, and bacteria. These organisms are so small that they can only be seen through a microscope. Bacteria are the microbes that most commonly cause UTIs. SYMPTOMS  Symptoms of UTIs may vary by age and gender of the patient and by the location of the infection. Symptoms in young women typically include a frequent and intense urge to urinate and a painful, burning feeling in the bladder or urethra during urination. Older women and men are more likely to be tired, shaky, and weak and have muscle aches and abdominal pain. A fever may mean the infection is in your kidneys. Other symptoms of a kidney infection include pain in your back or sides below the ribs, nausea, and vomiting. DIAGNOSIS To diagnose a UTI, your caregiver will ask you about your symptoms. Your caregiver also will ask to provide a urine sample. The urine sample will be tested for bacteria and white blood cells. White blood cells are made by your body to help fight infection. TREATMENT  Typically, UTIs can be treated with medication. Because most UTIs are caused by a bacterial infection, they usually can be treated with the use of antibiotics. The choice of antibiotic and length of treatment depend on your symptoms and the type of bacteria causing your infection. HOME CARE INSTRUCTIONS  If you were prescribed antibiotics, take  them exactly as your caregiver instructs you. Finish the medication even if you feel better after you have only taken some of the medication.  Drink enough water and fluids to keep your urine clear or pale yellow.  Avoid caffeine, tea, and carbonated beverages. They tend to irritate your bladder.  Empty your bladder often. Avoid holding urine for long periods of time.  Empty your bladder before and after sexual intercourse.  After a bowel movement, women should cleanse from front to back. Use each tissue only once. SEEK MEDICAL CARE IF:   You have back pain.  You develop a fever.  Your symptoms do not begin to resolve within 3 days. SEEK IMMEDIATE MEDICAL CARE IF:   You have severe back pain or lower abdominal pain.  You develop chills.  You have nausea or vomiting.  You have continued burning or discomfort with urination. MAKE SURE YOU:   Understand these instructions.  Will watch your condition.  Will get help right away if you are not doing well or get worse. Document Released: 08/12/2005 Document Revised: 05/03/2012 Document Reviewed: 12/11/2011 [facility] Patient Information 2015 [place], [place]. This information is not intended to replace advice given to you by your health care provider. Make sure you discuss any questions you have with your health care provider.

## 2014-06-24 LAB — HIV ANTIBODY (ROUTINE TESTING W REFLEX): HIV 1&2 Ab, 4th Generation: NONREACTIVE

## 2014-06-25 ENCOUNTER — Other Ambulatory Visit: Payer: Self-pay | Admitting: Gastroenterology

## 2014-06-25 DIAGNOSIS — K648 Other hemorrhoids: Secondary | ICD-10-CM

## 2014-06-25 NOTE — Telephone Encounter (Signed)
LMOM for her to call us

## 2014-06-25 NOTE — Telephone Encounter (Signed)
Our records indicate that she takes once daily. rx say BID. Please clarify.

## 2014-06-25 NOTE — Telephone Encounter (Signed)
I called and informed pt. She said she could not afford the Anusol, but she is doing just a little better. She would like the referral to Dr. Lovell SheehanJenkins.

## 2014-06-25 NOTE — Telephone Encounter (Signed)
Referral has been made to Dr. Jenkins 

## 2014-06-25 NOTE — Telephone Encounter (Signed)
She takes it BID

## 2014-06-26 LAB — GC/CHLAMYDIA PROBE AMP
CT PROBE, AMP APTIMA: NEGATIVE
GC PROBE AMP APTIMA: NEGATIVE

## 2014-07-12 ENCOUNTER — Other Ambulatory Visit: Payer: Medicaid Other | Admitting: Adult Health

## 2014-07-14 ENCOUNTER — Other Ambulatory Visit: Payer: Self-pay | Admitting: Gastroenterology

## 2014-07-18 ENCOUNTER — Encounter: Payer: Self-pay | Admitting: Adult Health

## 2014-07-18 ENCOUNTER — Other Ambulatory Visit (HOSPITAL_COMMUNITY)
Admission: RE | Admit: 2014-07-18 | Discharge: 2014-07-18 | Disposition: A | Discharge status: Home / Self Care | Payer: Medicaid Other | Source: Ambulatory Visit | Attending: Adult Health | Admitting: Adult Health

## 2014-07-18 ENCOUNTER — Ambulatory Visit (INDEPENDENT_AMBULATORY_CARE_PROVIDER_SITE_OTHER): Payer: Medicaid Other | Admitting: Adult Health

## 2014-07-18 VITALS — BP 100/60 | HR 78 | Ht 66.5 in | Wt 296.0 lb

## 2014-07-18 DIAGNOSIS — Z113 Encounter for screening for infections with a predominantly sexual mode of transmission: Secondary | ICD-10-CM | POA: Diagnosis present

## 2014-07-18 DIAGNOSIS — Z01419 Encounter for gynecological examination (general) (routine) without abnormal findings: Secondary | ICD-10-CM

## 2014-07-18 DIAGNOSIS — Z309 Encounter for contraceptive management, unspecified: Secondary | ICD-10-CM

## 2014-07-18 DIAGNOSIS — Z124 Encounter for screening for malignant neoplasm of cervix: Secondary | ICD-10-CM | POA: Insufficient documentation

## 2014-07-18 DIAGNOSIS — Z0000 Encounter for general adult medical examination without abnormal findings: Secondary | ICD-10-CM

## 2014-07-18 DIAGNOSIS — Z3041 Encounter for surveillance of contraceptive pills: Secondary | ICD-10-CM

## 2014-07-18 HISTORY — DX: Encounter for contraceptive management, unspecified: Z30.9

## 2014-07-18 MED ORDER — NORETHINDRONE-ETH ESTRADIOL 1-50 MG-MCG PO TABS: 1.0000 | ORAL_TABLET | Freq: Every day | ORAL | Status: DC

## 2014-07-18 NOTE — Patient Instructions (Signed)
Physical in 1 year Continue OCs  

## 2014-07-18 NOTE — Progress Notes (Signed)
Patient ID: [name], female   DOB: [birth year], 28 y.o.   MRN: [id number] History of Present Illness: [name] is a 28 year old white female, single in for a pap and physical.Has some pain in right breast at times, no lumps.   Current Medications, Allergies, Past Medical History, Past Surgical History, Family History and Social History were reviewed in [employer] record.     Review of Systems: Patient denies any headaches, blurred vision, shortness of breath, chest pain, abdominal pain, problems with bowel movements, urination, or intercourse. No joint pain or mood swings.Like her OCs, no ovary pain.See HPI.    Physical Exam:BP 100/60  Pulse 78  Ht 5' 6.5" (1.689 m)  Wt 296 lb (134.265 kg)  BMI 47.07 kg/m2  LMP 07/04/2014 General:  Well developed, well nourished, no acute distress Skin:  Warm and dry Neck:  Midline trachea, normal thyroid Lungs; Clear to auscultation bilaterally Breast:  No dominant palpable mass, retraction, or nipple discharge,discussed could from caffeine or hormonal. Cardiovascular: Regular rate and rhythm Abdomen:  Soft, non tender, no hepatosplenomegaly,obese Pelvic:  External genitalia is normal in appearance.  The vagina is normal in appearance.  The cervix is bulbous.Pap with GC/CHL performed.  Uterus is felt to be normal size, shape, and contour.  No                adnexal masses or tenderness noted. Extremities:  No swelling or varicosities noted Psych:  No mood changes, alert and cooperative, seems happy   Impression: Yearly gyn exam Contraceptive management     Plan: Refilled Ovcon 50 x 1 year Physical in 1 year Decrease caffeine

## 2014-07-20 LAB — CYTOLOGY - PAP

## 2014-07-26 ENCOUNTER — Encounter: Payer: Self-pay | Admitting: Gastroenterology

## 2014-08-01 ENCOUNTER — Other Ambulatory Visit: Payer: Self-pay | Admitting: Gastroenterology

## 2014-08-30 ENCOUNTER — Other Ambulatory Visit (HOSPITAL_COMMUNITY): Payer: Self-pay | Admitting: Family Medicine

## 2014-08-30 DIAGNOSIS — M545 Low back pain, unspecified: Secondary | ICD-10-CM

## 2014-08-31 ENCOUNTER — Other Ambulatory Visit: Payer: Self-pay

## 2014-09-05 ENCOUNTER — Ambulatory Visit (HOSPITAL_COMMUNITY): Payer: Medicaid Other

## 2014-09-17 ENCOUNTER — Encounter: Payer: Self-pay | Admitting: Adult Health

## 2014-10-03 NOTE — Telephone Encounter (Signed)
REVIEWED-NO ADDITIONAL RECOMMENDATIONS. 

## 2014-10-04 ENCOUNTER — Ambulatory Visit: Payer: Medicaid Other | Admitting: Gastroenterology

## 2014-10-09 ENCOUNTER — Encounter: Payer: Self-pay | Admitting: Gastroenterology

## 2014-11-11 ENCOUNTER — Inpatient Hospital Stay (HOSPITAL_COMMUNITY)
Admission: EM | Admit: 2014-11-11 | Discharge: 2014-11-14 | DRG: 391 | Disposition: A | Discharge status: Home / Self Care | Payer: Medicaid Other | Attending: Family Medicine | Admitting: Family Medicine

## 2014-11-11 ENCOUNTER — Encounter (HOSPITAL_COMMUNITY): Payer: Self-pay | Admitting: Emergency Medicine

## 2014-11-11 ENCOUNTER — Emergency Department (HOSPITAL_COMMUNITY): Payer: Medicaid Other

## 2014-11-11 DIAGNOSIS — R1110 Vomiting, unspecified: Secondary | ICD-10-CM

## 2014-11-11 DIAGNOSIS — R1010 Upper abdominal pain, unspecified: Secondary | ICD-10-CM

## 2014-11-11 DIAGNOSIS — F419 Anxiety disorder, unspecified: Secondary | ICD-10-CM | POA: Diagnosis present

## 2014-11-11 DIAGNOSIS — Z87891 Personal history of nicotine dependence: Secondary | ICD-10-CM

## 2014-11-11 DIAGNOSIS — R197 Diarrhea, unspecified: Secondary | ICD-10-CM

## 2014-11-11 DIAGNOSIS — N390 Urinary tract infection, site not specified: Secondary | ICD-10-CM | POA: Diagnosis present

## 2014-11-11 DIAGNOSIS — Z6841 Body Mass Index (BMI) 40.0 and over, adult: Secondary | ICD-10-CM

## 2014-11-11 DIAGNOSIS — J189 Pneumonia, unspecified organism: Secondary | ICD-10-CM | POA: Diagnosis present

## 2014-11-11 DIAGNOSIS — K529 Noninfective gastroenteritis and colitis, unspecified: Principal | ICD-10-CM | POA: Diagnosis present

## 2014-11-11 DIAGNOSIS — E876 Hypokalemia: Secondary | ICD-10-CM | POA: Diagnosis present

## 2014-11-11 DIAGNOSIS — K219 Gastro-esophageal reflux disease without esophagitis: Secondary | ICD-10-CM | POA: Diagnosis present

## 2014-11-11 LAB — CBC WITH DIFFERENTIAL/PLATELET
BASOS ABS: 0 10*3/uL (ref 0.0–0.1)
BASOS PCT: 0 % (ref 0–1)
EOS ABS: 0 10*3/uL (ref 0.0–0.7)
Eosinophils Relative: 0 % (ref 0–5)
HCT: 43.5 % (ref 36.0–46.0)
HEMOGLOBIN: 15.1 g/dL — AB (ref 12.0–15.0)
Lymphocytes Relative: 4 % — ABNORMAL LOW (ref 12–46)
Lymphs Abs: 0.5 10*3/uL — ABNORMAL LOW (ref 0.7–4.0)
MCH: 32.9 pg (ref 26.0–34.0)
MCHC: 34.7 g/dL (ref 30.0–36.0)
MCV: 94.8 fL (ref 78.0–100.0)
MONO ABS: 0.5 10*3/uL (ref 0.1–1.0)
MONOS PCT: 4 % (ref 3–12)
Neutro Abs: 10.4 10*3/uL — ABNORMAL HIGH (ref 1.7–7.7)
Neutrophils Relative %: 92 % — ABNORMAL HIGH (ref 43–77)
Platelets: 208 10*3/uL (ref 150–400)
RBC: 4.59 MIL/uL (ref 3.87–5.11)
RDW: 14.4 % (ref 11.5–15.5)
WBC: 11.4 10*3/uL — ABNORMAL HIGH (ref 4.0–10.5)

## 2014-11-11 LAB — BASIC METABOLIC PANEL
Anion gap: 10 (ref 5–15)
BUN: 13 mg/dL (ref 6–23)
CO2: 21 mmol/L (ref 19–32)
Calcium: 9.2 mg/dL (ref 8.4–10.5)
Chloride: 109 mEq/L (ref 96–112)
Creatinine, Ser: 0.92 mg/dL (ref 0.50–1.10)
GFR calc non Af Amer: 85 mL/min — ABNORMAL LOW (ref >90)
GLUCOSE: 167 mg/dL — AB (ref 70–99)
POTASSIUM: 3.4 mmol/L — AB (ref 3.5–5.1)
Sodium: 140 mmol/L (ref 135–145)

## 2014-11-11 LAB — URINALYSIS, ROUTINE W REFLEX MICROSCOPIC
BILIRUBIN URINE: NEGATIVE
Glucose, UA: NEGATIVE mg/dL
HGB URINE DIPSTICK: NEGATIVE
Nitrite: NEGATIVE
Specific Gravity, Urine: 1.02 (ref 1.005–1.030)
UROBILINOGEN UA: 0.2 mg/dL (ref 0.0–1.0)
pH: 7.5 (ref 5.0–8.0)

## 2014-11-11 LAB — HEPATIC FUNCTION PANEL
ALK PHOS: 111 U/L (ref 39–117)
ALT: 16 U/L (ref 0–35)
AST: 35 U/L (ref 0–37)
Albumin: 3.9 g/dL (ref 3.5–5.2)
BILIRUBIN DIRECT: 0.1 mg/dL (ref 0.0–0.3)
BILIRUBIN INDIRECT: 0.6 mg/dL (ref 0.3–0.9)
Total Bilirubin: 0.7 mg/dL (ref 0.3–1.2)
Total Protein: 7.2 g/dL (ref 6.0–8.3)

## 2014-11-11 LAB — URINE MICROSCOPIC-ADD ON

## 2014-11-11 LAB — LIPASE, BLOOD: Lipase: 26 U/L (ref 11–59)

## 2014-11-11 LAB — PREGNANCY, URINE: Preg Test, Ur: NEGATIVE

## 2014-11-11 LAB — STREP PNEUMONIAE URINARY ANTIGEN: Strep Pneumo Urinary Antigen: NEGATIVE

## 2014-11-11 MED ORDER — PANTOPRAZOLE SODIUM 40 MG PO TBEC
40.0000 mg | DELAYED_RELEASE_TABLET | Freq: Two times a day (BID) | ORAL | Status: DC
  Administered 2014-11-11 – 2014-11-13 (×6): 40 mg via ORAL
  Filled 2014-11-11 (×7): qty 1

## 2014-11-11 MED ORDER — DEXTROSE 5 % IV SOLN
1.0000 g | Freq: Once | INTRAVENOUS | Status: AC
  Administered 2014-11-11: 1 g via INTRAVENOUS
  Filled 2014-11-11: qty 10

## 2014-11-11 MED ORDER — ACETAMINOPHEN 650 MG RE SUPP: 650.0000 mg | Freq: Four times a day (QID) | RECTAL | Status: DC | PRN

## 2014-11-11 MED ORDER — ONDANSETRON HCL 4 MG PO TABS: 4.0000 mg | ORAL_TABLET | Freq: Four times a day (QID) | ORAL | Status: DC | PRN

## 2014-11-11 MED ORDER — HYDROCODONE-ACETAMINOPHEN 5-325 MG PO TABS: 1.0000 | ORAL_TABLET | ORAL | Status: DC | PRN

## 2014-11-11 MED ORDER — SUCRALFATE 1 GM/10ML PO SUSP: 1.0000 g | Freq: Three times a day (TID) | ORAL | Status: DC | PRN

## 2014-11-11 MED ORDER — PROMETHAZINE HCL 25 MG/ML IJ SOLN
25.0000 mg | Freq: Once | INTRAMUSCULAR | Status: AC
Start: 2014-11-11 — End: 2014-11-11
  Administered 2014-11-11: 25 mg via INTRAVENOUS
  Filled 2014-11-11: qty 1

## 2014-11-11 MED ORDER — POTASSIUM CHLORIDE 10 MEQ/100ML IV SOLN
10.0000 meq | INTRAVENOUS | Status: AC
  Administered 2014-11-11 (×3): 10 meq via INTRAVENOUS
  Filled 2014-11-11 (×3): qty 100

## 2014-11-11 MED ORDER — TOPIRAMATE 100 MG PO TABS
100.0000 mg | ORAL_TABLET | Freq: Every morning | ORAL | Status: DC
  Filled 2014-11-11 (×2): qty 1

## 2014-11-11 MED ORDER — IOHEXOL 300 MG/ML  SOLN
50.0000 mL | Freq: Once | INTRAMUSCULAR | Status: AC | PRN
  Administered 2014-11-11: 50 mL via ORAL

## 2014-11-11 MED ORDER — MORPHINE SULFATE 4 MG/ML IJ SOLN
INTRAMUSCULAR | Status: AC
  Administered 2014-11-11: 4 mg
  Filled 2014-11-11: qty 1

## 2014-11-11 MED ORDER — SODIUM CHLORIDE 0.9 % IV BOLUS (SEPSIS)
1000.0000 mL | Freq: Once | INTRAVENOUS | Status: AC
  Administered 2014-11-11: 1000 mL via INTRAVENOUS

## 2014-11-11 MED ORDER — IOHEXOL 300 MG/ML  SOLN
100.0000 mL | Freq: Once | INTRAMUSCULAR | Status: AC | PRN
  Administered 2014-11-11: 100 mL via INTRAVENOUS

## 2014-11-11 MED ORDER — SODIUM CHLORIDE 0.9 % IV SOLN
INTRAVENOUS | Status: DC
  Administered 2014-11-11 (×2): via INTRAVENOUS

## 2014-11-11 MED ORDER — MORPHINE SULFATE 4 MG/ML IJ SOLN
4.0000 mg | Freq: Once | INTRAMUSCULAR | Status: AC
  Administered 2014-11-11: 4 mg via INTRAVENOUS
  Filled 2014-11-11: qty 1

## 2014-11-11 MED ORDER — HYDROMORPHONE HCL 1 MG/ML IJ SOLN
1.0000 mg | INTRAMUSCULAR | Status: DC | PRN
  Administered 2014-11-11 – 2014-11-13 (×12): 1 mg via INTRAVENOUS
  Filled 2014-11-11 (×12): qty 1

## 2014-11-11 MED ORDER — MORPHINE SULFATE 2 MG/ML IJ SOLN
1.0000 mg | INTRAMUSCULAR | Status: DC | PRN
  Administered 2014-11-11: 1 mg via INTRAVENOUS
  Filled 2014-11-11: qty 1

## 2014-11-11 MED ORDER — AZITHROMYCIN 250 MG PO TABS
500.0000 mg | ORAL_TABLET | ORAL | Status: DC
  Administered 2014-11-11 – 2014-11-13 (×3): 500 mg via ORAL
  Filled 2014-11-11 (×4): qty 2

## 2014-11-11 MED ORDER — ONDANSETRON 4 MG PO TBDP: ORAL_TABLET | ORAL | Status: DC

## 2014-11-11 MED ORDER — DULOXETINE HCL 60 MG PO CPEP
60.0000 mg | ORAL_CAPSULE | Freq: Every day | ORAL | Status: DC
  Administered 2014-11-11 – 2014-11-13 (×3): 60 mg via ORAL
  Filled 2014-11-11 (×3): qty 1

## 2014-11-11 MED ORDER — ACETAMINOPHEN 325 MG PO TABS
650.0000 mg | ORAL_TABLET | Freq: Four times a day (QID) | ORAL | Status: DC | PRN
  Administered 2014-11-11 – 2014-11-14 (×2): 650 mg via ORAL
  Filled 2014-11-11 (×2): qty 2

## 2014-11-11 MED ORDER — AZITHROMYCIN 250 MG PO TABS: ORAL_TABLET | ORAL | Status: DC

## 2014-11-11 MED ORDER — ONDANSETRON HCL 4 MG/2ML IJ SOLN
4.0000 mg | Freq: Once | INTRAMUSCULAR | Status: AC
Start: 2014-11-11 — End: 2014-11-11
  Administered 2014-11-11: 4 mg via INTRAVENOUS
  Filled 2014-11-11: qty 2

## 2014-11-11 MED ORDER — ONDANSETRON HCL 4 MG/2ML IJ SOLN
INTRAMUSCULAR | Status: AC
  Administered 2014-11-11: 04:00:00
  Filled 2014-11-11: qty 2

## 2014-11-11 MED ORDER — SODIUM CHLORIDE 0.9 % IV SOLN
INTRAVENOUS | Status: AC
  Administered 2014-11-11: 08:00:00 via INTRAVENOUS

## 2014-11-11 MED ORDER — SODIUM CHLORIDE 0.9 % IV SOLN
INTRAVENOUS | Status: DC
  Administered 2014-11-11 – 2014-11-13 (×6): via INTRAVENOUS

## 2014-11-11 MED ORDER — SODIUM CHLORIDE 0.9 % IV BOLUS (SEPSIS)
2000.0000 mL | Freq: Once | INTRAVENOUS | Status: AC
  Administered 2014-11-11: 1000 mL via INTRAVENOUS

## 2014-11-11 MED ORDER — ONDANSETRON HCL 4 MG/2ML IJ SOLN: 4.0000 mg | Freq: Three times a day (TID) | INTRAMUSCULAR | Status: DC | PRN

## 2014-11-11 MED ORDER — VALACYCLOVIR HCL 500 MG PO TABS
1000.0000 mg | ORAL_TABLET | Freq: Every day | ORAL | Status: DC
  Administered 2014-11-12 – 2014-11-13 (×2): 1000 mg via ORAL
  Filled 2014-11-11 (×3): qty 2

## 2014-11-11 MED ORDER — ONDANSETRON HCL 4 MG/2ML IJ SOLN
4.0000 mg | Freq: Four times a day (QID) | INTRAMUSCULAR | Status: DC | PRN
  Administered 2014-11-11 – 2014-11-13 (×5): 4 mg via INTRAVENOUS
  Filled 2014-11-11 (×5): qty 2

## 2014-11-11 MED ORDER — MORPHINE SULFATE 4 MG/ML IJ SOLN
4.0000 mg | Freq: Once | INTRAMUSCULAR | Status: AC
Start: 2014-11-11 — End: 2014-11-11
  Administered 2014-11-11: 4 mg via INTRAVENOUS
  Filled 2014-11-11: qty 1

## 2014-11-11 MED ORDER — CEFTRIAXONE SODIUM IN DEXTROSE 20 MG/ML IV SOLN
1.0000 g | INTRAVENOUS | Status: DC
  Administered 2014-11-12 – 2014-11-13 (×2): 1 g via INTRAVENOUS
  Filled 2014-11-11 (×3): qty 50

## 2014-11-11 MED ORDER — ONDANSETRON HCL 4 MG/2ML IJ SOLN
4.0000 mg | Freq: Once | INTRAMUSCULAR | Status: AC
  Administered 2014-11-11: 4 mg via INTRAVENOUS
  Filled 2014-11-11: qty 2

## 2014-11-11 MED ORDER — POTASSIUM CHLORIDE 10 MEQ/100ML IV SOLN
10.0000 meq | Freq: Once | INTRAVENOUS | Status: AC
  Administered 2014-11-11: 10 meq via INTRAVENOUS

## 2014-11-11 MED ORDER — GI COCKTAIL ~~LOC~~
30.0000 mL | Freq: Once | ORAL | Status: AC
  Administered 2014-11-11: 30 mL via ORAL
  Filled 2014-11-11: qty 30

## 2014-11-11 NOTE — Discharge Instructions (Signed)
Abdominal Pain °Many things can cause abdominal pain. Usually, abdominal pain is not caused by a disease and will improve without treatment. It can often be observed and treated at home. Your health care provider will do a physical exam and possibly order blood tests and X-rays to help determine the seriousness of your pain. However, in many cases, more time must pass before a clear cause of the pain can be found. Before that point, your health care provider may not know if you need more testing or further treatment. °HOME CARE INSTRUCTIONS  °Monitor your abdominal pain for any changes. The following actions may help to alleviate any discomfort you are experiencing: °· Only take over-the-counter or prescription medicines as directed by your health care provider. °· Do not take laxatives unless directed to do so by your health care provider. °· Try a clear liquid diet (broth, tea, or water) as directed by your health care provider. Slowly move to a bland diet as tolerated. °SEEK MEDICAL CARE IF: °· You have unexplained abdominal pain. °· You have abdominal pain associated with nausea or diarrhea. °· You have pain when you urinate or have a bowel movement. °· You experience abdominal pain that wakes you in the night. °· You have abdominal pain that is worsened or improved by eating food. °· You have abdominal pain that is worsened with eating fatty foods. °· You have a fever. °SEEK IMMEDIATE MEDICAL CARE IF:  °· Your pain does not go away within 2 hours. °· You keep throwing up (vomiting). °· Your pain is felt only in portions of the abdomen, such as the right side or the left lower portion of the abdomen. °· You pass bloody or black tarry stools. °MAKE SURE YOU: °· Understand these instructions.   °· Will watch your condition.   °· Will get help right away if you are not doing well or get worse.   °Document Released: 08/12/2005 Document Revised: 11/07/2013 Document Reviewed: 07/12/2013 °ExitCare® Patient Information  ©2015 [employer]. This information is not intended to replace advice given to you by your health care provider. Make sure you discuss any questions you have with your health care provider. ° °Gastritis, Adult °Gastritis is soreness and swelling (inflammation) of the lining of the stomach. Gastritis can develop as a sudden onset (acute) or long-term (chronic) condition. If gastritis is not treated, it can lead to stomach bleeding and ulcers. °CAUSES  °Gastritis occurs when the stomach lining is weak or damaged. Digestive juices from the stomach then inflame the weakened stomach lining. The stomach lining may be weak or damaged due to viral or bacterial infections. One common bacterial infection is the Helicobacter pylori infection. Gastritis can also result from excessive alcohol consumption, taking certain medicines, or having too much acid in the stomach.  °SYMPTOMS  °In some cases, there are no symptoms. When symptoms are present, they may include: °· Pain or a burning sensation in the upper abdomen. °· Nausea. °· Vomiting. °· An uncomfortable feeling of fullness after eating. °DIAGNOSIS  °Your caregiver may suspect you have gastritis based on your symptoms and a physical exam. To determine the cause of your gastritis, your caregiver may perform the following: °· Blood or stool tests to check for the H pylori bacterium. °· Gastroscopy. A thin, flexible tube (endoscope) is passed down the esophagus and into the stomach. The endoscope has a light and camera on the end. Your caregiver uses the endoscope to view the inside of the stomach. °· Taking a tissue sample (biopsy)   biopsy) from the stomach to examine under a microscope. TREATMENT  Depending on the cause of your gastritis, medicines may be prescribed. If you have a bacterial infection, such as an H pylori infection, antibiotics may be given. If your gastritis is caused by too much acid in the stomach, H2 blockers or antacids may be given. Your  caregiver may recommend that you stop taking aspirin, ibuprofen, or other nonsteroidal anti-inflammatory drugs (NSAIDs). HOME CARE INSTRUCTIONS  Only take over-the-counter or prescription medicines as directed by your caregiver.  If you were given antibiotic medicines, take them as directed. Finish them even if you start to feel better.  Drink enough fluids to keep your urine clear or pale yellow.  Avoid foods and drinks that make your symptoms worse, such as:  Caffeine or alcoholic drinks.  Chocolate.  Peppermint or mint flavorings.  Garlic and onions.  Spicy foods.  Citrus fruits, such as oranges, lemons, or limes.  Tomato-based foods such as sauce, chili, salsa, and pizza.  Fried and fatty foods.  Eat small, frequent meals instead of large meals. SEEK IMMEDIATE MEDICAL CARE IF:   You have black or dark red stools.  You vomit blood or material that looks like coffee grounds.  You are unable to keep fluids down.  Your abdominal pain gets worse.  You have a fever.  You do not feel better after 1 week.  You have any other questions or concerns. MAKE SURE YOU:  Understand these instructions.  Will watch your condition.  Will get help right away if you are not doing well or get worse. Document Released: 10/27/2001 Document Revised: 05/03/2012 Document Reviewed: 12/16/2011 [facility] Patient Information 2015 [place], [place]. This information is not intended to replace advice given to you by your health care provider. Make sure you discuss any questions you have with your health care provider.  Nausea and Vomiting Nausea is a sick feeling that often comes before throwing up (vomiting). Vomiting is a reflex where stomach contents come out of your mouth. Vomiting can cause severe loss of body fluids (dehydration). Children and elderly adults can become dehydrated quickly, especially if they also have diarrhea. Nausea and vomiting are symptoms of a condition or disease. It  is important to find the cause of your symptoms. CAUSES   Direct irritation of the stomach lining. This irritation can result from increased acid production (gastroesophageal reflux disease), infection, food poisoning, taking certain medicines (such as nonsteroidal anti-inflammatory drugs), alcohol use, or tobacco use.  Signals from the brain.These signals could be caused by a headache, heat exposure, an inner ear disturbance, increased pressure in the brain from injury, infection, a tumor, or a concussion, pain, emotional stimulus, or metabolic problems.  An obstruction in the gastrointestinal tract (bowel obstruction).  Illnesses such as diabetes, hepatitis, gallbladder problems, appendicitis, kidney problems, cancer, sepsis, atypical symptoms of a heart attack, or eating disorders.  Medical treatments such as chemotherapy and radiation.  Receiving medicine that makes you sleep (general anesthetic) during surgery. DIAGNOSIS Your caregiver may ask for tests to be done if the problems do not improve after a few days. Tests may also be done if symptoms are severe or if the reason for the nausea and vomiting is not clear. Tests may include:  Urine tests.  Blood tests.  Stool tests.  Cultures (to look for evidence of infection).  X-rays or other imaging studies. Test results can help your caregiver make decisions about treatment or the need for additional tests. TREATMENT You need to stay  well hydrated. Drink frequently but in small amounts.You may wish to drink water, sports drinks, clear broth, or eat frozen ice pops or gelatin dessert to help stay hydrated.When you eat, eating slowly may help prevent nausea.There are also some antinausea medicines that may help prevent nausea. HOME CARE INSTRUCTIONS   Take all medicine as directed by your caregiver.  If you do not have an appetite, do not force yourself to eat. However, you must continue to drink fluids.  If you have an  appetite, eat a normal diet unless your caregiver tells you differently.  Eat a variety of complex carbohydrates (rice, wheat, potatoes, bread), lean meats, yogurt, fruits, and vegetables.  Avoid high-fat foods because they are more difficult to digest.  Drink enough water and fluids to keep your urine clear or pale yellow.  If you are dehydrated, ask your caregiver for specific rehydration instructions. Signs of dehydration may include:  Severe thirst.  Dry lips and mouth.  Dizziness.  Dark urine.  Decreasing urine frequency and amount.  Confusion.  Rapid breathing or pulse. SEEK IMMEDIATE MEDICAL CARE IF:   You have blood or brown flecks (like coffee grounds) in your vomit.  You have black or bloody stools.  You have a severe headache or stiff neck.  You are confused.  You have severe abdominal pain.  You have chest pain or trouble breathing.  You do not urinate at least once every 8 hours.  You develop cold or clammy skin.  You continue to vomit for longer than 24 to 48 hours.  You have a fever. MAKE SURE YOU:   Understand these instructions.  Will watch your condition.  Will get help right away if you are not doing well or get worse. Document Released: 11/02/2005 Document Revised: 01/25/2012 Document Reviewed: 04/01/2011 [facility] Patient Information 2015 [place], [place]. This information is not intended to replace advice given to you by your health care provider. Make sure you discuss any questions you have with your health care provider.  Pneumonia Pneumonia is an infection of the lungs.  CAUSES Pneumonia may be caused by bacteria or a virus. Usually, these infections are caused by breathing infectious particles into the lungs (respiratory tract). SIGNS AND SYMPTOMS   Cough.  Fever.  Chest pain.  Increased rate of breathing.  Wheezing.  Mucus production. DIAGNOSIS  If you have the common symptoms of pneumonia, your health care provider will  typically confirm the diagnosis with a chest X-ray. The X-ray will show an abnormality in the lung (pulmonary infiltrate) if you have pneumonia. Other tests of your blood, urine, or sputum may be done to find the specific cause of your pneumonia. Your health care provider may also do tests (blood gases or pulse oximetry) to see how well your lungs are working. TREATMENT  Some forms of pneumonia may be spread to other people when you cough or sneeze. You may be asked to wear a mask before and during your exam. Pneumonia that is caused by bacteria is treated with antibiotic medicine. Pneumonia that is caused by the influenza virus may be treated with an antiviral medicine. Most other viral infections must run their course. These infections will not respond to antibiotics.  HOME CARE INSTRUCTIONS   Cough suppressants may be used if you are losing too much rest. However, coughing protects you by clearing your lungs. You should avoid using cough suppressants if you can.  Your health care provider may have prescribed medicine if he or she thinks your pneumonia is  caused by bacteria or influenza. Finish your medicine even if you start to feel better.  Your health care provider may also prescribe an expectorant. This loosens the mucus to be coughed up.  Take medicines only as directed by your health care provider.  Do not smoke. Smoking is a common cause of bronchitis and can contribute to pneumonia. If you are a smoker and continue to smoke, your cough may last several weeks after your pneumonia has cleared.  A cold steam vaporizer or humidifier in your room or home may help loosen mucus.  Coughing is often worse at night. Sleeping in a semi-upright position in a recliner or using a couple pillows under your head will help with this.  Get rest as you feel it is needed. Your body will usually let you know when you need to rest. PREVENTION A pneumococcal shot (vaccine) is available to prevent a common  bacterial cause of pneumonia. This is usually suggested for:  People over 26 years old.  Patients on chemotherapy.  People with chronic lung problems, such as bronchitis or emphysema.  People with immune system problems. If you are over 65 or have a high risk condition, you may receive the pneumococcal vaccine if you have not received it before. In some countries, a routine influenza vaccine is also recommended. This vaccine can help prevent some cases of pneumonia.You may be offered the influenza vaccine as part of your care. If you smoke, it is time to quit. You may receive instructions on how to stop smoking. Your health care provider can provide medicines and counseling to help you quit. SEEK MEDICAL CARE IF: You have a fever. SEEK IMMEDIATE MEDICAL CARE IF:   Your illness becomes worse. This is especially true if you are elderly or weakened from any other disease.  You cannot control your cough with suppressants and are losing sleep.  You begin coughing up blood.  You develop pain which is getting worse or is uncontrolled with medicines.  Any of the symptoms which initially brought you in for treatment are getting worse rather than better.  You develop shortness of breath or chest pain. MAKE SURE YOU:   Understand these instructions.  Will watch your condition.  Will get help right away if you are not doing well or get worse. Document Released: 11/02/2005 Document Revised: 03/19/2014 Document Reviewed: 01/22/2011 [facility]Exit[facility] Patient Information 2015 [place], [place]. This information is not intended to replace advice given to you by your health care provider. Make sure you discuss any questions you have with your health care provider.

## 2014-11-11 NOTE — ED Notes (Signed)
Attempted to call floor for report, nurse not available.

## 2014-11-11 NOTE — ED Provider Notes (Signed)
CSN: [id number]     Arrival date & time 11/11/14  [id number] History   First MD Initiated Contact with Patient 11/11/14 0326     Chief Complaint  Patient presents with  . Emesis     (Consider location/radiation/quality/duration/timing/severity/associated sxs/prior Treatment) HPI Patient presents with multiple episodes of vomiting and diarrhea starting this evening. She also complains of upper abdominal pain. Patient has a history of gastritis and esophageal reflux disease. She's followed by gastroenterologist. She denies any shortness of breath. Denies fever or chills. Denies cough. Denies any urinary symptoms. No blood in vomit or diarrhea. Past Medical History  Diagnosis Date  . Herpes   . GERD (gastroesophageal reflux disease)   . Gastritis   . Disk prolapse   . Bulging disc   . Obesity   . Mental disorder     anxiety  . Other and unspecified ovarian cyst 12/01/2013  . Elevated liver enzymes 12/01/2013  . Ovarian cyst, right   . Headache   . Abdominal pain, chronic, right lower quadrant   . Abdominal pain, chronic, epigastric   . Contraceptive management 07/18/2014   Past Surgical History  Procedure Laterality Date  . Wisdom tooth extraction    . Esophagogastroduodenoscopy  01/19/2012    JXB:JYNWGN,FAOZHYQMSLF:ULCERS,MULTIPLE IN THE ANTRUM/HIATAL HERNIA/  . Colonoscopy with esophagogastroduodenoscopy (egd) N/A 02/24/2013    VHQ:IONGEXBMWUXLSLF:INTERMITTENT RECTAL BLEEDING DUE TO Moderate sized internal hemorrhoids, EGD: erosive esophagitis due to uncontrolled reflux, moderate erosive gastritis, benign path   Family History  Problem Relation Age of Onset  . Diabetes Mother   . Diabetes Other   . Colon cancer Neg Hx   . Colon polyps Neg Hx   . Diabetes Maternal Grandmother   . COPD Maternal Grandmother   . Diabetes Maternal Grandfather    History  Substance Use Topics  . Smoking status: Former Smoker -- 0.25 packs/day for .5 years    Types: Cigarettes    Quit date: 07/28/2011  . Smokeless tobacco: Never Used   . Alcohol Use: No   OB History    Gravida Para Term Preterm AB TAB SAB Ectopic Multiple Living   1 1 1       1      Review of Systems  Constitutional: Negative for fever and chills.  Respiratory: Negative for cough and shortness of breath.   Cardiovascular: Negative for chest pain.  Gastrointestinal: Positive for nausea, vomiting, abdominal pain and diarrhea. Negative for constipation and blood in stool.  Genitourinary: Negative for dysuria, frequency, hematuria and flank pain.  Musculoskeletal: Negative for back pain, neck pain and neck stiffness.  Skin: Negative for rash and wound.  Neurological: Negative for dizziness, weakness, light-headedness, numbness and headaches.  All other systems reviewed and are negative.     Allergies  Review of patient's allergies indicates no known allergies.  Home Medications   Prior to Admission medications   Medication Sig Start Date End Date Taking? Authorizing Provider  azithromycin (ZITHROMAX Z-PAK) 250 MG tablet 2 po day one, then 1 daily x 4 days 11/11/14   Loren Raceravid Wisdom Rickey, MD  DULoxetine (CYMBALTA) 60 MG capsule Take 60 mg by mouth at bedtime.    Historical Provider, MD  HYDROcodone-acetaminophen (NORCO) 5-325 MG per tablet Take 1-2 tablets by mouth every 4 (four) hours as needed for severe pain. 11/11/14   Loren Raceravid Della Scrivener, MD  norethindrone-ethinyl estradiol (OVCON-50) 1-50 MG-MCG tablet Take 1 tablet by mouth daily. 07/18/14   Adline PotterJennifer A Griffin, NP  ondansetron (ZOFRAN ODT) 4 MG disintegrating tablet 4mg  ODT  q4 hours prn nausea/vomit 11/11/14   Loren Racer, MD  pantoprazole (PROTONIX) 40 MG tablet TAKE 1 TABLET BY MOUTH TWICE A DAY    Tiffany Kocher, PA-C  pantoprazole (PROTONIX) 40 MG tablet TAKE 1 TABLET BY MOUTH TWICE A DAY 08/02/14   Nira Retort, NP  sucralfate (CARAFATE) 1 GM/10ML suspension Take 10 mLs (1 g total) by mouth 3 (three) times daily as needed (Epigastric pain). 11/11/14   Loren Racer, MD  tiZANidine (ZANAFLEX) 2  MG tablet Take 2 mg by mouth at bedtime.     Historical Provider, MD  topiramate (TOPAMAX) 100 MG tablet Take 100 mg by mouth every morning.     Historical Provider, MD  valACYclovir (VALTREX) 1000 MG tablet TAKE 1 TABLET EVERY DAY    Adline Potter, NP   BP 109/75 mmHg  Pulse 78  Temp(Src) 97.7 F (36.5 C) (Oral)  Resp 20  Ht 5\' 6"  (1.676 m)  Wt 298 lb (135.172 kg)  BMI 48.12 kg/m2  SpO2 100%  LMP 11/09/2014 Physical Exam  Constitutional: She is oriented to person, place, and time. She appears well-developed and well-nourished. No distress.  Actively vomiting  HENT:  Head: Normocephalic and atraumatic.  Mouth/Throat: Oropharynx is clear and moist.  Mildly erythematous oropharynx  Eyes: EOM are normal. Pupils are equal, round, and reactive to light.  Neck: Normal range of motion. Neck supple.  No meningismus  Cardiovascular: Normal rate and regular rhythm.   Pulmonary/Chest: Effort normal and breath sounds normal. No respiratory distress. She has no wheezes. She has no rales. She exhibits no tenderness.  Abdominal: Soft. Bowel sounds are normal. She exhibits no distension and no mass. There is tenderness (tenderness to palpation in epigastric region). There is no rebound and no guarding.  Musculoskeletal: Normal range of motion. She exhibits no edema or tenderness.  No CVA tenderness bilaterally.  Neurological: She is alert and oriented to person, place, and time.  Moves all extremities without deficit. Sensation is intact.  Skin: Skin is warm and dry. No rash noted. No erythema.  Psychiatric: She has a normal mood and affect. Her behavior is normal.  Nursing note and vitals reviewed.   ED Course  Procedures (including critical care time) Labs Review Labs Reviewed  BASIC METABOLIC PANEL - Abnormal; Notable for the following:    Potassium 3.4 (*)    Glucose, Bld 167 (*)    GFR calc non Af Amer 85 (*)    All other components within normal limits  CBC WITH DIFFERENTIAL -  Abnormal; Notable for the following:    WBC 11.4 (*)    Hemoglobin 15.1 (*)    Neutrophils Relative % 92 (*)    Neutro Abs 10.4 (*)    Lymphocytes Relative 4 (*)    Lymphs Abs 0.5 (*)    All other components within normal limits  URINALYSIS, ROUTINE W REFLEX MICROSCOPIC - Abnormal; Notable for the following:    Ketones, ur TRACE (*)    Protein, ur TRACE (*)    Leukocytes, UA SMALL (*)    All other components within normal limits  URINE MICROSCOPIC-ADD ON - Abnormal; Notable for the following:    Squamous Epithelial / LPF MANY (*)    Bacteria, UA MANY (*)    All other components within normal limits  PREGNANCY, URINE  HEPATIC FUNCTION PANEL  LIPASE, BLOOD    Imaging Review Ct Abdomen Pelvis W Contrast  11/11/2014   CLINICAL DATA:  Nausea and vomiting when waking up  yesterday evening. LEFT upper quadrant pain radiating to RIGHT upper quadrant. Headache, diaphoresis.  EXAM: CT ABDOMEN AND PELVIS WITH CONTRAST  TECHNIQUE: Multidetector CT imaging of the abdomen and pelvis was performed using the standard protocol following bolus administration of intravenous contrast.  CONTRAST:  50mL OMNIPAQUE IOHEXOL 300 MG/ML SOLN, 100mL OMNIPAQUE IOHEXOL 300 MG/ML SOLN  COMPARISON:  CT of the abdomen and pelvis June 05, 2014  FINDINGS: Large body habitus results in overall noisy image quality.  LUNG BASES: Included view of the lung bases demonstrate patchy ground-glass opacities within the lingula. Visualized heart and pericardium are unremarkable.  SOLID ORGANS: The liver, gallbladder, pancreas and adrenal glands are unremarkable. Mild splenomegaly, of 12 point 8 cm, similar to prior imaging.  GASTROINTESTINAL TRACT: The stomach, small and large bowel are normal in course and caliber without inflammatory changes. Fluid within the small and large bowel. Normal appendix.  KIDNEYS/ URINARY TRACT: Kidneys are orthotopic, demonstrating symmetric enhancement. No nephrolithiasis, hydronephrosis or solid renal  masses. The unopacified ureters are normal in course and caliber. Urinary bladder is partially distended and unremarkable.  PERITONEUM/RETROPERITONEUM: No intraperitoneal free fluid nor free air. Aortoiliac vessels are normal in course and caliber. No lymphadenopathy by CT size criteria. Internal reproductive organs are unremarkable.  SOFT TISSUE/OSSEOUS STRUCTURES: Nonsuspicious.  IMPRESSION: Fluid within the small and large bowel can be seen with enteritis. No bowel obstruction.  Patchy ground-glass opacities in the lingula concerning for pneumonia.  Stable splenomegaly.   Electronically Signed   By: Awilda Metroourtnay  Bloomer   On: 11/11/2014 06:08     EKG Interpretation None      MDM   Final diagnoses:  Upper abdominal pain  Vomiting and diarrhea  Community acquired pneumonia    Patient with history of chronic epigastric pain related to peptic ulcer disease and gastric reflux. Similar pain this evening. Symptoms are improved after IV medication and fluids. She continued to deny any respiratory symptoms including cough and shortness of breath. Questionable pneumonia on CT. Patient also has a questionable urinary tract infection. She is given a dose of Rocephin in the emergency department.  Patient's with recurrence of vomiting despite multiple doses of antiemetics and pain medication. She continues to have epigastric pain. Vital signs remained stable. We'll likely need admitted for observation.  Loren Raceravid Rashee Marschall, MD 11/11/14 [id number]

## 2014-11-11 NOTE — ED Notes (Signed)
Dr Goodrich at bedside

## 2014-11-11 NOTE — ED Notes (Signed)
Patient report nausea, vomiting, diarrhea, upper abdominal pain, and headache that started tonight. Patient actively vomiting in triage and diaphoretic.

## 2014-11-11 NOTE — H&P (Signed)
History and Physical  [name] ZOX:[id number] DOB: October 09, 1986 DOA: 11/11/2014 Referring physician: Dr. Ranae PalmsYelverton in ED PCP: Alice ReichertMCINNIS,ANGUS G, MD   Chief Complaint: Vomiting  HPI:  28 year old woman presented to the emergency department with acute onset of vomiting and diarrhea. Admitted for acute gastroenteritis, UTI, possible pneumonia.  Patient felt well yesterday 12/26. Approximately 10:30 PM that evening she developed acute vomiting and diarrhea with many episodes, nonbloody. She had generalized abdominal cramping with this especially in the upper abdomen. No specific aggravating or alleviating factors. She has also had some pain with urination. No cough or trouble breathing. Low-grade temperature at home possibly. No recent antibiotics. No sick contacts known.  In the emergency department afebrile, vital signs stable. No hypoxia. Treated with ceftriaxone, GI cocktail, morphine, Phenergan, Zofran. Basic metabolic panel, hepatic function panel and lipase unremarkable except potassium 3.4. WBC 11.4. CBC otherwise unremarkable.urine pregnancy negative. Urinalysis grossly positive. Fluid in the large and small bowel suggestive of enteritis. No obstruction. Possible lingula pneumonia.  Review of Systems:  Negative for visual changes, sore throat, rash, new muscle aches, chest pain, SOB,  Bleeding.  Past Medical History  Diagnosis Date  . Herpes   . GERD (gastroesophageal reflux disease)   . Gastritis   . Disk prolapse   . Bulging disc   . Obesity   . Mental disorder     anxiety  . Other and unspecified ovarian cyst 12/01/2013  . Elevated liver enzymes 12/01/2013  . Ovarian cyst, right   . Headache   . Abdominal pain, chronic, right lower quadrant   . Abdominal pain, chronic, epigastric   . Contraceptive management 07/18/2014    Past Surgical History  Procedure Laterality Date  . Wisdom tooth extraction    . Esophagogastroduodenoscopy  01/19/2012    WJX:BJYNWG,NFAOZHYQSLF:ULCERS,MULTIPLE IN THE  ANTRUM/HIATAL HERNIA/  . Colonoscopy with esophagogastroduodenoscopy (egd) N/A 02/24/2013    MVH:QIONGEXBMWUXSLF:INTERMITTENT RECTAL BLEEDING DUE TO Moderate sized internal hemorrhoids, EGD: erosive esophagitis due to uncontrolled reflux, moderate erosive gastritis, benign path    Social History:  reports that she quit smoking about 3 years ago. Her smoking use included Cigarettes. She has a .125 pack-year smoking history. She has never used smokeless tobacco. She reports that she does not drink alcohol or use illicit drugs.  No Known Allergies  Family History  Problem Relation Age of Onset  . Diabetes Mother   . Diabetes Other   . Colon cancer Neg Hx   . Colon polyps Neg Hx   . Diabetes Maternal Grandmother   . COPD Maternal Grandmother   . Diabetes Maternal Grandfather      Prior to Admission medications   Medication Sig Start Date End Date Taking? Authorizing Provider  azithromycin (ZITHROMAX Z-PAK) 250 MG tablet 2 po day one, then 1 daily x 4 days 11/11/14   Loren Raceravid [name], MD  DULoxetine (CYMBALTA) 60 MG capsule Take 60 mg by mouth at bedtime.    Historical Provider, MD  HYDROcodone-acetaminophen (NORCO) 5-325 MG per tablet Take 1-2 tablets by mouth every 4 (four) hours as needed for severe pain. 11/11/14   Loren Raceravid [name], MD  norethindrone-ethinyl estradiol (OVCON-50) 1-50 MG-MCG tablet Take 1 tablet by mouth daily. 07/18/14   Adline PotterJennifer A Griffin, NP  ondansetron (ZOFRAN ODT) 4 MG disintegrating tablet 4mg  ODT q4 hours prn nausea/vomit 11/11/14   Loren Raceravid [name], MD  pantoprazole (PROTONIX) 40 MG tablet TAKE 1 TABLET BY MOUTH TWICE A DAY    Leanna BattlesLeslie S Lewis, PA-C  pantoprazole (PROTONIX) 40 MG tablet TAKE  1 TABLET BY MOUTH TWICE A DAY 08/02/14   Nira Retort, NP  sucralfate (CARAFATE) 1 GM/10ML suspension Take 10 mLs (1 g total) by mouth 3 (three) times daily as needed (Epigastric pain). 11/11/14   Loren Racer, MD  tiZANidine (ZANAFLEX) 2 MG tablet Take 2 mg by mouth at bedtime.     Historical  Provider, MD  topiramate (TOPAMAX) 100 MG tablet Take 100 mg by mouth every morning.     Historical Provider, MD  valACYclovir (VALTREX) 1000 MG tablet TAKE 1 TABLET EVERY DAY    Adline Potter, NP   Physical Exam: Filed Vitals:   11/11/14 0306 11/11/14 0649 11/11/14 0808  BP: 139/93 109/75 96/66  Pulse: 95 78 99  Temp: 97.7 F (36.5 C)    TempSrc: Oral    Resp: 20 20 14   Height: 5\' 6"  (1.676 m)    Weight: 135.172 kg (298 lb)    SpO2: 99% 100% 97%   General: Examined in the emergency department. Appears calm and comfortable Eyes: PERRL, normal lids, irises  ENT: grossly normal hearing, lips & tongue Neck: Grossly unremarkable. Cardiovascular: RRR, no m/r/g. No LE edema. Respiratory: CTA bilaterally, no w/r/r. Normal respiratory effort. Abdomen: Obese, soft, nondistended. Mild generalized upper abdominal tenderness. No rebound. No suprapubic pain. Skin: no rash or induration noted Musculoskeletal: grossly normal tone BUE/BLE Psychiatric: grossly normal mood and affect, speech fluent and appropriate Neurologic: grossly non-focal.  Wt Readings from Last 3 Encounters:  11/11/14 135.172 kg (298 lb)  07/18/14 134.265 kg (296 lb)  06/23/14 141.069 kg (311 lb)    Labs on Admission:  Basic Metabolic Panel:  Recent Labs Lab 11/11/14 0312  NA 140  K 3.4*  CL 109  CO2 21  GLUCOSE 167*  BUN 13  CREATININE 0.92  CALCIUM 9.2    Liver Function Tests:  Recent Labs Lab 11/11/14 0459  AST 35  ALT 16  ALKPHOS 111  BILITOT 0.7  PROT 7.2  ALBUMIN 3.9    Recent Labs Lab 11/11/14 0459  LIPASE 26    CBC:  Recent Labs Lab 11/11/14 0312  WBC 11.4*  NEUTROABS 10.4*  HGB 15.1*  HCT 43.5  MCV 94.8  PLT 208     Radiological Exams on Admission: Ct Abdomen Pelvis W Contrast  11/11/2014   CLINICAL DATA:  Nausea and vomiting when waking up yesterday evening. LEFT upper quadrant pain radiating to RIGHT upper quadrant. Headache, diaphoresis.  EXAM: CT ABDOMEN AND  PELVIS WITH CONTRAST  TECHNIQUE: Multidetector CT imaging of the abdomen and pelvis was performed using the standard protocol following bolus administration of intravenous contrast.  CONTRAST:  50mL OMNIPAQUE IOHEXOL 300 MG/ML SOLN, OMNIPAQUE IOHEXOL 300 MG/ML SOLN  COMPARISON:  CT of the abdomen and pelvis June 05, 2014  FINDINGS: Large body habitus results in overall noisy image quality.  LUNG BASES: Included view of the lung bases demonstrate patchy ground-glass opacities within the lingula. Visualized heart and pericardium are unremarkable.  SOLID ORGANS: The liver, gallbladder, pancreas and adrenal glands are unremarkable. Mild splenomegaly, of 12 point 8 cm, similar to prior imaging.  GASTROINTESTINAL TRACT: The stomach, small and large bowel are normal in course and caliber without inflammatory changes. Fluid within the small and large bowel. Normal appendix.  KIDNEYS/ URINARY TRACT: Kidneys are orthotopic, demonstrating symmetric enhancement. No nephrolithiasis, hydronephrosis or solid renal masses. The unopacified ureters are normal in course and caliber. Urinary bladder is partially distended and unremarkable.  PERITONEUM/RETROPERITONEUM: No intraperitoneal free fluid nor free  air. Aortoiliac vessels are normal in course and caliber. No lymphadenopathy by CT size criteria. Internal reproductive organs are unremarkable.  SOFT TISSUE/OSSEOUS STRUCTURES: Nonsuspicious.  IMPRESSION: Fluid within the small and large bowel can be seen with enteritis. No bowel obstruction.  Patchy ground-glass opacities in the lingula concerning for pneumonia.  Stable splenomegaly.   Electronically Signed   By: Awilda Metroourtnay  Bloomer   On: 11/11/2014 06:08     Principal Problem:   Acute gastroenteritis Active Problems:   Vomiting and diarrhea   UTI (lower urinary tract infection)   Hypokalemia   Morbid obesity   Assessment/Plan 1. Acute gastroenteritis, presumed viral with associated nausea, vomiting and diarrhea  with mild generalized abdominal pain. 2. Symptomatic UTI. 3. Possible pneumonia seen on chest CT, completely asymptomatic, doubt significant. 4. Hypokalemia. 5. Morbid obesity   Appears clinically stable. Plan admission to the medical floor for observation.  Supportive care, IV fluids, antibiotics. Check GI pathogen panel. No recent antibiotics. No reason to suspect C. Difficile.  Empiric antibiotics for UTI and possible pneumonia. Check Legionella and strep pneumo antigens.  Replace potassium.  Dietitian consult.  Discussed in detail with the patient and mother at bedside.  Code Status: full code  DVT prophylaxis:early ambulation, SCDs Family Communication:  Disposition Plan/Anticipated LOS: obs, 24 hours, Dr. Renard MatterMcinnis.  Time spent: 50 minutes  Brendia Sacksaniel Aristotelis Vilardi, MD  [facility] Pager [phone] 11/11/2014, 8:34 AM

## 2014-11-12 DIAGNOSIS — E876 Hypokalemia: Secondary | ICD-10-CM | POA: Diagnosis present

## 2014-11-12 DIAGNOSIS — F419 Anxiety disorder, unspecified: Secondary | ICD-10-CM | POA: Diagnosis present

## 2014-11-12 DIAGNOSIS — N390 Urinary tract infection, site not specified: Secondary | ICD-10-CM | POA: Diagnosis present

## 2014-11-12 DIAGNOSIS — K529 Noninfective gastroenteritis and colitis, unspecified: Secondary | ICD-10-CM | POA: Diagnosis not present

## 2014-11-12 DIAGNOSIS — R1110 Vomiting, unspecified: Secondary | ICD-10-CM | POA: Diagnosis not present

## 2014-11-12 DIAGNOSIS — J189 Pneumonia, unspecified organism: Secondary | ICD-10-CM | POA: Diagnosis present

## 2014-11-12 DIAGNOSIS — Z6841 Body Mass Index (BMI) 40.0 and over, adult: Secondary | ICD-10-CM | POA: Diagnosis not present

## 2014-11-12 DIAGNOSIS — Z87891 Personal history of nicotine dependence: Secondary | ICD-10-CM | POA: Diagnosis not present

## 2014-11-12 DIAGNOSIS — K219 Gastro-esophageal reflux disease without esophagitis: Secondary | ICD-10-CM | POA: Diagnosis present

## 2014-11-12 LAB — BASIC METABOLIC PANEL
ANION GAP: 6 (ref 5–15)
BUN: 8 mg/dL (ref 6–23)
CHLORIDE: 113 meq/L — AB (ref 96–112)
CO2: 20 mmol/L (ref 19–32)
CREATININE: 0.82 mg/dL (ref 0.50–1.10)
Calcium: 8.3 mg/dL — ABNORMAL LOW (ref 8.4–10.5)
Glucose, Bld: 110 mg/dL — ABNORMAL HIGH (ref 70–99)
POTASSIUM: 3.3 mmol/L — AB (ref 3.5–5.1)
Sodium: 139 mmol/L (ref 135–145)

## 2014-11-12 LAB — CBC
HCT: 39 % (ref 36.0–46.0)
HEMOGLOBIN: 13.1 g/dL (ref 12.0–15.0)
MCH: 32 pg (ref 26.0–34.0)
MCHC: 33.6 g/dL (ref 30.0–36.0)
MCV: 95.4 fL (ref 78.0–100.0)
Platelets: 189 10*3/uL (ref 150–400)
RBC: 4.09 MIL/uL (ref 3.87–5.11)
RDW: 14.4 % (ref 11.5–15.5)
WBC: 6 10*3/uL (ref 4.0–10.5)

## 2014-11-12 LAB — URINE CULTURE
Colony Count: NO GROWTH
Culture: NO GROWTH

## 2014-11-12 MED ORDER — BOOST / RESOURCE BREEZE PO LIQD
1.0000 | Freq: Three times a day (TID) | ORAL | Status: DC
  Administered 2014-11-13 (×3): 1 via ORAL

## 2014-11-12 MED ORDER — LOPERAMIDE HCL 2 MG PO CAPS: 2.0000 mg | ORAL_CAPSULE | Freq: Three times a day (TID) | ORAL | Status: DC | PRN

## 2014-11-12 NOTE — Care Management Note (Signed)
    Page 1 of 1   11/12/2014     1:43:51 PM CARE MANAGEMENT NOTE 11/12/2014  Patient:  [name],[name]   Account Number:  [id number]  Date Initiated:  11/12/2014  Documentation initiated by:  CHILDRESS,JESSICA  Subjective/Objective Assessment:   Pt admitted with gastroenteritis. Pt is from home with self care. Pt plans to discharge home with self care. No CM needs at this time.     Action/Plan:   Anticipated DC Date:  11/13/2014   Anticipated DC Plan:  HOME/SELF CARE      DC Planning Services  CM consult      Choice offered to / List presented to:             Status of service:  Completed, signed off Medicare Important Message given?   (If response is "NO", the following Medicare IM given date fields will be blank) Date Medicare IM given:   Medicare IM given by:   Date Additional Medicare IM given:   Additional Medicare IM given by:    Discharge Disposition:  HOME/SELF CARE  Per UR Regulation:    If discussed at Long Length of Stay Meetings, dates discussed:    Comments:  11/12/2014 1300 Kathyrn SheriffJessica Childress, RN, MSN, [facility]

## 2014-11-12 NOTE — Progress Notes (Signed)
Subjective: The patient is alert this morning. She does still have some nausea. She was admitted with gastroenteritis hypokinemia urinary tract infection and possible pneumonia  Objective: Vital signs in last 24 hours: Temp:  [98.6 F (37 C)-99.6 F (37.6 C)] 98.7 F (37.1 C) (12/28 0600) Pulse Rate:  [78-99] 99 (12/28 0600) Resp:  [14-20] 18 (12/28 0600) BP: (96-118)/(63-76) 115/76 mmHg (12/28 0600) SpO2:  [97 %-100 %] 99 % (12/28 0600) Weight:  [125.193 kg (276 lb)] 125.193 kg (276 lb) (12/27 1007) Weight change: -9.979 kg (-22 lb) Last BM Date: 11/11/14  Intake/Output from previous day: 12/27 0701 - 12/28 0700 In: 708.3 [I.V.:308.3; IV Piggyback:400] Out: 1400 [Urine:1400] Intake/Output this shift: Total I/O In: -  Out: 850 [Urine:850]  Physical Exam: General appearance patient appears alert and fairly comfortable  HEENT negative  Neck supple no JVD or thyroid abnormalities  Lungs clear to P&A  Heart regular rhythm no murmurs  Abdomen obese slight tenderness in upper abdomen  Skin warm and dry   Recent Labs  11/11/14 0312  WBC 11.4*  HGB 15.1*  HCT 43.5  PLT 208   BMET  Recent Labs  11/11/14 0312  NA 140  K 3.4*  CL 109  CO2 21  GLUCOSE 167*  BUN 13  CREATININE 0.92  CALCIUM 9.2    Studies/Results: Ct Abdomen Pelvis W Contrast  11/11/2014   CLINICAL DATA:  Nausea and vomiting when waking up yesterday evening. LEFT upper quadrant pain radiating to RIGHT upper quadrant. Headache, diaphoresis.  EXAM: CT ABDOMEN AND PELVIS WITH CONTRAST  TECHNIQUE: Multidetector CT imaging of the abdomen and pelvis was performed using the standard protocol following bolus administration of intravenous contrast.  CONTRAST:  50mL OMNIPAQUE IOHEXOL 300 MG/ML SOLN, 100mL OMNIPAQUE IOHEXOL 300 MG/ML SOLN  COMPARISON:  CT of the abdomen and pelvis June 05, 2014  FINDINGS: Large body habitus results in overall noisy image quality.  LUNG BASES: Included view of the lung  bases demonstrate patchy ground-glass opacities within the lingula. Visualized heart and pericardium are unremarkable.  SOLID ORGANS: The liver, gallbladder, pancreas and adrenal glands are unremarkable. Mild splenomegaly, of 12 point 8 cm, similar to prior imaging.  GASTROINTESTINAL TRACT: The stomach, small and large bowel are normal in course and caliber without inflammatory changes. Fluid within the small and large bowel. Normal appendix.  KIDNEYS/ URINARY TRACT: Kidneys are orthotopic, demonstrating symmetric enhancement. No nephrolithiasis, hydronephrosis or solid renal masses. The unopacified ureters are normal in course and caliber. Urinary bladder is partially distended and unremarkable.  PERITONEUM/RETROPERITONEUM: No intraperitoneal free fluid nor free air. Aortoiliac vessels are normal in course and caliber. No lymphadenopathy by CT size criteria. Internal reproductive organs are unremarkable.  SOFT TISSUE/OSSEOUS STRUCTURES: Nonsuspicious.  IMPRESSION: Fluid within the small and large bowel can be seen with enteritis. No bowel obstruction.  Patchy ground-glass opacities in the lingula concerning for pneumonia.  Stable splenomegaly.   Electronically Signed   By: Awilda Metroourtnay  Bloomer   On: 11/11/2014 06:08    Medications:  . azithromycin  500 mg Oral Q24H  . cefTRIAXone (ROCEPHIN)  IV  1 g Intravenous Q24H  . DULoxetine  60 mg Oral QHS  . pantoprazole  40 mg Oral BID  . valACYclovir  1,000 mg Oral Daily    . sodium chloride 150 mL/hr at 11/12/14 0043     Assessment/Plan: 1. Acute gastroenteritis with vomiting and diarrhea-plan to continue IV fluids continue to monitor chemistries and replaced potassium  2. Continue IV antibiotics because her  UTI and possible pneumonia   LOS: 1 day   Reyli Schroth G 11/12/2014, 6:19 AM

## 2014-11-12 NOTE — Progress Notes (Signed)
1625 Spoke with Dr.McGinnis regarding pt's request for diet advancement & also reported pt's continued episodes of diarrhea. New orders given to advance pt's diet to soft diet & new order for Immodium 2mg  PO up to TID PRN for diarrhea.

## 2014-11-12 NOTE — Progress Notes (Signed)
INITIAL NUTRITION ASSESSMENT  DOCUMENTATION CODES Per approved criteria  -Morbid Obesity   INTERVENTION: Resource Breeze po TID, each supplement provides 250 kcal and 9 grams of protein.  Recommend (if pt is agreed); follow up with outpatient RD at [facility] for additional nutrition counseling for weight loss at [phone].  NUTRITION DIAGNOSIS: Food and nutrition related knowledge deficit: Incomplete or inaccurate knowledge about food, nutrition, or nutrition-related information and guidelines, e.g., nutrient requirements, consequences of food behaviors, life stage requirements, nutrition recommendations.   Goal: Pt will tolerate diet advancement to CHO modified and follow up as outpatient with dietitian for further nutrition counseling and monitoring for weight loss and transition to more healthy food choices overall.  Monitor:  Progression of diet prescription, compliance and percent intake  Reason for Assessment: assess nutrition status and requirements   28 y.o. female  Admitting Dx: Acute gastroenteritis  ASSESSMENT:  Pt denies further nausea or vomiting today. Her diet is being advanced. Pt follows regular diet at home. Primary beverage regular Mt Dew.   She was seen in  ED in July due to dehydration, low potassium and gastroenteritis. She has hx of chronic epigastric pain related to PUD and GERD.  Her weight has decreased 7% in past 90 days, 11% in 5 months which is significant. Pt has decreased po intake over past few months associated with ongoing abdominal pain.   Height: Ht Readings from Last 1 Encounters:  11/11/14 5\' 6"  (1.676 m)    Weight: Wt Readings from Last 1 Encounters:  11/11/14 276 lb (125.193 kg)    Ideal Body Weight: 130# (59 kg)  % Ideal Body Weight: 212%  Wt Readings from Last 10 Encounters:  11/11/14 276 lb (125.193 kg)  07/18/14 296 lb (134.265 kg)  06/23/14 311 lb (141.069 kg)  06/10/14 312 lb 1.6 oz  (141.568 kg)  02/06/14 315 lb 12.8 oz (143.246 kg)  01/03/14 321 lb (145.605 kg)  12/14/13 321 lb 9.6 oz (145.877 kg)  12/05/13 323 lb (146.512 kg)  12/01/13 327 lb (148.326 kg)  11/27/13 320 lb (145.151 kg)    Usual Body Weight: 295-300#   % Usual Body Weight: 93%  BMI:  Body mass index is 44.57 kg/(m^2).obesity class III   Estimated Nutritional Needs: Kcal: 1500-1700  Protein: 100-120 gr Fluid: >1500 ml daily  Skin: intact  Diet Order: Diet clear liquid  EDUCATION NEEDS: -Education needs addressed   Intake/Output Summary (Last 24 hours) at 11/12/14 1548 Last data filed at 11/12/14 1438  Gross per 24 hour  Intake 2478.33 ml  Output   1875 ml  Net 603.33 ml    Last BM: 12/27   Labs:   Recent Labs Lab 11/11/14 0312 11/12/14 0617  NA 140 139  K 3.4* 3.3*  CL 109 113*  CO2 21 20  BUN 13 8  CREATININE 0.92 0.82  CALCIUM 9.2 8.3*  GLUCOSE 167* 110*    CBG (last 3)  No results for input(s): GLUCAP in the last 72 hours.  Scheduled Meds: . azithromycin  500 mg Oral Q24H  . cefTRIAXone (ROCEPHIN)  IV  1 g Intravenous Q24H  . DULoxetine  60 mg Oral QHS  . pantoprazole  40 mg Oral BID  . valACYclovir  1,000 mg Oral Daily    Continuous Infusions: . sodium chloride 150 mL/hr at 11/12/14 [id number]    Past Medical History  Diagnosis Date  . Herpes   . GERD (gastroesophageal reflux disease)   . Gastritis   .  Disk prolapse   . Bulging disc   . Obesity   . Mental disorder     anxiety  . Other and unspecified ovarian cyst 12/01/2013  . Elevated liver enzymes 12/01/2013  . Ovarian cyst, right   . Headache   . Abdominal pain, chronic, right lower quadrant   . Abdominal pain, chronic, epigastric   . Contraceptive management 07/18/2014    Past Surgical History  Procedure Laterality Date  . Wisdom tooth extraction    . Esophagogastroduodenoscopy  01/19/2012    ZOX:WRUEAV,[id number]:ULCERS,MULTIPLE IN THE ANTRUM/HIATAL HERNIA/  . Colonoscopy with esophagogastroduodenoscopy  (egd) N/A 02/24/2013    YNW:[id number]:INTERMITTENT RECTAL BLEEDING DUE TO Moderate sized internal hemorrhoids, EGD: erosive esophagitis due to uncontrolled reflux, moderate erosive gastritis, benign path    Royann ShiversLynn Madhav Mohon MS,RD,CSG,LDN Office: [phone] Pager: [phone]

## 2014-11-13 LAB — URINALYSIS, ROUTINE W REFLEX MICROSCOPIC
Bilirubin Urine: NEGATIVE
Glucose, UA: NEGATIVE mg/dL
Hgb urine dipstick: NEGATIVE
KETONES UR: NEGATIVE mg/dL
NITRITE: NEGATIVE
PROTEIN: NEGATIVE mg/dL
Specific Gravity, Urine: 1.01 (ref 1.005–1.030)
UROBILINOGEN UA: 0.2 mg/dL (ref 0.0–1.0)
pH: 6.5 (ref 5.0–8.0)

## 2014-11-13 LAB — GI PATHOGEN PANEL BY PCR, STOOL
C DIFFICILE TOXIN A/B: NEGATIVE
CRYPTOSPORIDIUM BY PCR: NEGATIVE
Campylobacter by PCR: NEGATIVE
E coli (ETEC) LT/ST: NEGATIVE
E coli (STEC): NEGATIVE
E coli 0157 by PCR: NEGATIVE
G LAMBLIA BY PCR: NEGATIVE
Norovirus GI/GII: NEGATIVE
ROTAVIRUS A BY PCR: NEGATIVE
SALMONELLA BY PCR: NEGATIVE
Shigella by PCR: NEGATIVE

## 2014-11-13 LAB — BASIC METABOLIC PANEL
ANION GAP: 4 — AB (ref 5–15)
ANION GAP: 3 — AB (ref 5–15)
BUN: 7 mg/dL (ref 6–23)
BUN: 7 mg/dL (ref 6–23)
CALCIUM: 8.1 mg/dL — AB (ref 8.4–10.5)
CALCIUM: 8.3 mg/dL — AB (ref 8.4–10.5)
CO2: 22 mmol/L (ref 19–32)
CO2: 25 mmol/L (ref 19–32)
Chloride: 114 mEq/L — ABNORMAL HIGH (ref 96–112)
Chloride: 114 mEq/L — ABNORMAL HIGH (ref 96–112)
Creatinine, Ser: 0.8 mg/dL (ref 0.50–1.10)
Creatinine, Ser: 0.78 mg/dL (ref 0.50–1.10)
GFR calc Af Amer: >90 mL/min (ref >90)
GFR calc Af Amer: >90 mL/min (ref >90)
GLUCOSE: 94 mg/dL (ref 70–99)
GLUCOSE: 86 mg/dL (ref 70–99)
POTASSIUM: 3.3 mmol/L — AB (ref 3.5–5.1)
Potassium: 3.5 mmol/L (ref 3.5–5.1)
Sodium: 140 mmol/L (ref 135–145)
Sodium: 142 mmol/L (ref 135–145)

## 2014-11-13 LAB — LEGIONELLA ANTIGEN, URINE

## 2014-11-13 LAB — URINE MICROSCOPIC-ADD ON

## 2014-11-13 MED ORDER — POTASSIUM CHLORIDE CRYS ER 20 MEQ PO TBCR
20.0000 meq | EXTENDED_RELEASE_TABLET | Freq: Every day | ORAL | Status: DC
  Administered 2014-11-13: 20 meq via ORAL
  Filled 2014-11-13: qty 1

## 2014-11-13 NOTE — Progress Notes (Signed)
1805 Dr.McInnis on unit ordered for pt to have urine sample sent for UA & culture. Orders read back & confirmed with MD. Pt aware.

## 2014-11-13 NOTE — Progress Notes (Signed)
Subjective: The patient is feeling some better. She still has some nausea and abdominal pain. She was admitted with gastroenteritis hypokalemia urinary tract infection possible early pneumonia  Objective: Vital signs in last 24 hours: Temp:  [97.6 F (36.4 C)-98.3 F (36.8 C)] 98.2 F (36.8 C) (12/29 1510) Pulse Rate:  [74-87] 74 (12/29 1516) Resp:  [18-20] 18 (12/29 1510) BP: (110-172)/(49-108) 172/100 mmHg (12/29 1516) SpO2:  [100 %] 100 % (12/29 1510) Weight change:  Last BM Date: 11/12/14  Intake/Output from previous day: 12/28 0701 - 12/29 0700 In: 4262.5 [P.O.:1200; I.V.:3062.5] Out: 1175 [Urine:700; Stool:475] Intake/Output this shift: Total I/O In: 240 [P.O.:240] Out: -   Physical Exam: General appearance patient appears alert and fairly comfortable  HEENT negative  Neck supple no JVD or thyroid abnormalities  Lungs clear to P&A  Heart regular rhythm no murmurs  Abdomen obese slight tenderness in the upper abdomen  Skin warm and dry   Recent Labs  11/11/14 0312 11/12/14 0617  WBC 11.4* 6.0  HGB 15.1* 13.1  HCT 43.5 39.0  PLT 208 189   BMET  Recent Labs  11/13/14 0720 11/13/14 1424  NA 140 142  K 3.3* 3.5  CL 114* 114*  CO2 22 25  GLUCOSE 94 86  BUN 7 7  CREATININE 0.80 0.78  CALCIUM 8.1* 8.3*    Studies/Results: No results found.  Medications:  . azithromycin  500 mg Oral Q24H  . cefTRIAXone (ROCEPHIN)  IV  1 g Intravenous Q24H  . DULoxetine  60 mg Oral QHS  . feeding supplement (RESOURCE BREEZE)  1 Container Oral TID BM  . pantoprazole  40 mg Oral BID  . potassium chloride  20 mEq Oral Daily  . valACYclovir  1,000 mg Oral Daily    . sodium chloride 150 mL/hr at 11/13/14 0919     Assessment/Plan: 1. Acute gastroenteritis with vomiting and diarrhea losartan potassium-plan to continue IV fluids continue to make chemistries to replete potassium  2. Continue IV antibiotics because her UTI and possible pneumonia   LOS: 2 days    Thorsten Climer G 11/13/2014, 5:24 PM

## 2014-11-13 NOTE — Progress Notes (Signed)
1406 Spoke with Dr.McInnis regarding pt's d/c plan. Dr.McInnis gave new orders for Klor-Con 20mEq PO 1 TAB QD d/t low potassium levels (3.3 on 11/12/14 & 3.3 on 11/13/14) & to repeat BMET in the morning. Possible d/c home tomorrow if potassium levels & c/o ABD cramping improve. Pt aware.

## 2014-11-14 ENCOUNTER — Ambulatory Visit: Payer: Medicaid Other | Admitting: Gastroenterology

## 2014-11-14 NOTE — Discharge Summary (Signed)
Physician Discharge Summary  [name] ZOX:[id number] DOB: 1986-09-22 DOA: 11/11/2014  PCP: Alice Reichert, MD  Admit date: 11/11/2014 Discharge date: 11/14/2014   Follow-up Information    Follow up with Alice Reichert, MD.   Specialty:  Family Medicine   Contact information:   [address] [place] [address] [phone]       Follow up with [facility] EMERGENCY DEPARTMENT.   Specialty:  Emergency Medicine   Why:  As needed, If symptoms worsen   Contact information:   [address] [id number] [place] [address] [phone]      Discharge Diagnoses:  1. Acute gastroenteritis 2. Urinary tract infection 3. Low serum potassium 4. Possible early pneumonia 5. Obesity  Discharge Condition: Stable Disposition: Home  Diet recommendation: Soft and then regular diet  Filed Weights   11/11/14 0306 11/11/14 1007  Weight: 135.172 kg (298 lb) 125.193 kg (276 lb)    History of present illness:  The patient presented to the ED with a history of vomiting and diarrhea. She was admitted with acute gastroenteritis urinary tract infection and possible pneumonia  Hospital Course:  The patient had lower abdominal cramps on admission and episodes of vomiting. In emergency room she is treated with ceftriaxone GI cocktail morphine Phenergan. It was noted she had low serum potassium 3.4 white count 11.4. Urinalysis was grossly positive. It was noted on CT that she had fluid enlargement small bowel suggestive of enteritis but no obstruction. She is continued on intravenous fluids. Her potassium was repleted. Her urine culture was negative and she did not culture GI pathogens. She completed course of antibiotics Zithromax and Rocephin. Her diet was advanced and she tolerated this in satisfactory manner. It was felt that she could be discharged home. The patient continued medications listed below.   Discharge Instructions The patient is to continue  medications listed below. She was asked to return to primary care physician's office in approximately one week    Medication List    TAKE these medications        azithromycin 250 MG tablet  Commonly known as:  ZITHROMAX Z-PAK  2 po day one, then 1 daily x 4 days     DULoxetine 60 MG capsule  Commonly known as:  CYMBALTA  Take 60 mg by mouth at bedtime.     HYDROcodone-acetaminophen 5-325 MG per tablet  Commonly known as:  NORCO  Take 1-2 tablets by mouth every 4 (four) hours as needed for severe pain.     LYRICA 75 MG capsule  Generic drug:  pregabalin  Take 75 mg by mouth 2 (two) times daily.     norethindrone-ethinyl estradiol 1-50 MG-MCG tablet  Commonly known as:  OVCON-50  Take 1 tablet by mouth daily.     ondansetron 4 MG disintegrating tablet  Commonly known as:  ZOFRAN ODT  4mg  ODT q4 hours prn nausea/vomit     pantoprazole 40 MG tablet  Commonly known as:  PROTONIX  TAKE 1 TABLET BY MOUTH TWICE A DAY     sucralfate 1 GM/10ML suspension  Commonly known as:  CARAFATE  Take 10 mLs (1 g total) by mouth 3 (three) times daily as needed (Epigastric pain).     tiZANidine 2 MG tablet  Commonly known as:  ZANAFLEX  Take 2 mg by mouth at bedtime.     valACYclovir 1000 MG tablet  Commonly known as:  VALTREX  TAKE 1 TABLET EVERY DAY       No  Known Allergies  The results of significant diagnostics from this hospitalization (including imaging, microbiology, ancillary and laboratory) are listed below for reference.    Significant Diagnostic Studies: Ct Abdomen Pelvis W Contrast  11/11/2014   CLINICAL DATA:  Nausea and vomiting when waking up yesterday evening. LEFT upper quadrant pain radiating to RIGHT upper quadrant. Headache, diaphoresis.  EXAM: CT ABDOMEN AND PELVIS WITH CONTRAST  TECHNIQUE: Multidetector CT imaging of the abdomen and pelvis was performed using the standard protocol following bolus administration of intravenous contrast.  CONTRAST:  50mL  OMNIPAQUE IOHEXOL 300 MG/ML SOLN, 100mL OMNIPAQUE IOHEXOL 300 MG/ML SOLN  COMPARISON:  CT of the abdomen and pelvis June 05, 2014  FINDINGS: Large body habitus results in overall noisy image quality.  LUNG BASES: Included view of the lung bases demonstrate patchy ground-glass opacities within the lingula. Visualized heart and pericardium are unremarkable.  SOLID ORGANS: The liver, gallbladder, pancreas and adrenal glands are unremarkable. Mild splenomegaly, of 12 point 8 cm, similar to prior imaging.  GASTROINTESTINAL TRACT: The stomach, small and large bowel are normal in course and caliber without inflammatory changes. Fluid within the small and large bowel. Normal appendix.  KIDNEYS/ URINARY TRACT: Kidneys are orthotopic, demonstrating symmetric enhancement. No nephrolithiasis, hydronephrosis or solid renal masses. The unopacified ureters are normal in course and caliber. Urinary bladder is partially distended and unremarkable.  PERITONEUM/RETROPERITONEUM: No intraperitoneal free fluid nor free air. Aortoiliac vessels are normal in course and caliber. No lymphadenopathy by CT size criteria. Internal reproductive organs are unremarkable.  SOFT TISSUE/OSSEOUS STRUCTURES: Nonsuspicious.  IMPRESSION: Fluid within the small and large bowel can be seen with enteritis. No bowel obstruction.  Patchy ground-glass opacities in the lingula concerning for pneumonia.  Stable splenomegaly.   Electronically Signed   By: Awilda Metroourtnay  Bloomer   On: 11/11/2014 06:08    Microbiology: Recent Results (from the past 240 hour(s))  Culture, Urine     Status: None   Collection Time: 11/11/14  4:58 AM  Result Value Ref Range Status   Specimen Description URINE, CLEAN CATCH  Final   Special Requests NONE  Final   Colony Count NO GROWTH Performed at [facility]   Final   Culture NO GROWTH Performed at [facility]   Final   Report Status 11/12/2014 FINAL  Final     Labs: Basic Metabolic Panel:  Recent  Labs Lab 11/11/14 0312 11/12/14 0617 11/13/14 0720 11/13/14 1424  NA 140 139 140 142  K 3.4* 3.3* 3.3* 3.5  CL 109 113* 114* 114*  CO2 21 20 22 25   GLUCOSE 167* 110* 94 86  BUN 13 8 7 7   CREATININE 0.92 0.82 0.80 0.78  CALCIUM 9.2 8.3* 8.1* 8.3*   Liver Function Tests:  Recent Labs Lab 11/11/14 0459  AST 35  ALT 16  ALKPHOS 111  BILITOT 0.7  PROT 7.2  ALBUMIN 3.9    Recent Labs Lab 11/11/14 0459  LIPASE 26   No results for input(s): AMMONIA in the last 168 hours. CBC:  Recent Labs Lab 11/11/14 0312 11/12/14 0617  WBC 11.4* 6.0  NEUTROABS 10.4*  --   HGB 15.1* 13.1  HCT 43.5 39.0  MCV 94.8 95.4  PLT 208 189   Cardiac Enzymes: No results for input(s): CKTOTAL, CKMB, CKMBINDEX, TROPONINI in the last 168 hours. BNP: BNP (last 3 results) No results for input(s): PROBNP in the last 8760 hours. CBG: No results for input(s): GLUCAP in the last 168 hours.  Principal Problem:  Acute gastroenteritis Active Problems:   Vomiting and diarrhea   UTI (lower urinary tract infection)   Hypokalemia   Morbid obesity   Time coordinating discharge: 30 minutes Signed:  Butch PennyAngus Thien Berka, MD 11/14/2014, 6:17 AM

## 2014-11-14 NOTE — Progress Notes (Signed)
Reviewed discharge instructions with patient.  IV removed, pt dressed and ready to go home, family at the bedside.  Patient given the opportunity to ask questions.  All questions anwered.  Will continue to monitor until pt leaves the floor.

## 2014-11-22 ENCOUNTER — Encounter: Payer: Medicaid Other | Admitting: Gastroenterology

## 2014-11-22 ENCOUNTER — Telehealth: Payer: Self-pay | Admitting: Gastroenterology

## 2014-11-22 ENCOUNTER — Encounter: Payer: Self-pay | Admitting: Gastroenterology

## 2014-11-22 NOTE — Telephone Encounter (Signed)
PATIENT WAS A NO SHOW 11/22/14 AND LETTER SENT

## 2014-11-22 NOTE — Telephone Encounter (Signed)
PT WILL BE D/C FROM PRACTICE IF SHE MISSES ANOTHER OPV. REVIEWED.

## 2014-11-22 NOTE — Progress Notes (Signed)
   Subjective:    Patient ID: [name], female    DOB: 08-07-1986, 29 y.o.   MRN: [id number]  HPI   Past Medical History  Diagnosis Date  . Herpes   . GERD (gastroesophageal reflux disease)   . Gastritis   . Disk prolapse   . Bulging disc   . Obesity   . Mental disorder     anxiety  . Other and unspecified ovarian cyst 12/01/2013  . Elevated liver enzymes 12/01/2013  . Ovarian cyst, right   . Headache   . Abdominal pain, chronic, right lower quadrant   . Abdominal pain, chronic, epigastric   . Contraceptive management 07/18/2014   Past Surgical History  Procedure Laterality Date  . Wisdom tooth extraction    . Esophagogastroduodenoscopy  01/19/2012    NWG:NFAOZH,[id number]:ULCERS,MULTIPLE IN THE ANTRUM/HIATAL HERNIA/  . Colonoscopy with esophagogastroduodenoscopy (egd) N/A 02/24/2013    NGE:[id number]:INTERMITTENT RECTAL BLEEDING DUE TO Moderate sized internal hemorrhoids, EGD: erosive esophagitis due to uncontrolled reflux, moderate erosive gastritis, benign path     Review of Systems     Objective:   Physical Exam        Assessment & Plan:

## 2014-11-28 ENCOUNTER — Other Ambulatory Visit (HOSPITAL_COMMUNITY): Payer: Self-pay | Admitting: Family Medicine

## 2014-11-28 ENCOUNTER — Ambulatory Visit (HOSPITAL_COMMUNITY)
Admission: RE | Admit: 2014-11-28 | Discharge: 2014-11-28 | Disposition: A | Discharge status: Home / Self Care | Payer: Medicaid Other | Source: Ambulatory Visit | Attending: Family Medicine | Admitting: Family Medicine

## 2014-11-28 DIAGNOSIS — R05 Cough: Secondary | ICD-10-CM | POA: Insufficient documentation

## 2014-11-28 DIAGNOSIS — J189 Pneumonia, unspecified organism: Secondary | ICD-10-CM

## 2014-11-30 ENCOUNTER — Encounter (HOSPITAL_COMMUNITY): Payer: Self-pay | Admitting: *Deleted

## 2014-11-30 ENCOUNTER — Emergency Department (HOSPITAL_COMMUNITY)
Admission: EM | Admit: 2014-11-30 | Discharge: 2014-11-30 | Disposition: A | Discharge status: Home / Self Care | Payer: Medicaid Other | Attending: Emergency Medicine | Admitting: Emergency Medicine

## 2014-11-30 DIAGNOSIS — Z87448 Personal history of other diseases of urinary system: Secondary | ICD-10-CM | POA: Diagnosis not present

## 2014-11-30 DIAGNOSIS — R110 Nausea: Secondary | ICD-10-CM | POA: Diagnosis not present

## 2014-11-30 DIAGNOSIS — Z8619 Personal history of other infectious and parasitic diseases: Secondary | ICD-10-CM | POA: Diagnosis not present

## 2014-11-30 DIAGNOSIS — G8929 Other chronic pain: Secondary | ICD-10-CM | POA: Insufficient documentation

## 2014-11-30 DIAGNOSIS — N938 Other specified abnormal uterine and vaginal bleeding: Secondary | ICD-10-CM | POA: Diagnosis not present

## 2014-11-30 DIAGNOSIS — Z9889 Other specified postprocedural states: Secondary | ICD-10-CM | POA: Insufficient documentation

## 2014-11-30 DIAGNOSIS — Z79899 Other long term (current) drug therapy: Secondary | ICD-10-CM | POA: Diagnosis not present

## 2014-11-30 DIAGNOSIS — E669 Obesity, unspecified: Secondary | ICD-10-CM | POA: Insufficient documentation

## 2014-11-30 DIAGNOSIS — N926 Irregular menstruation, unspecified: Secondary | ICD-10-CM | POA: Insufficient documentation

## 2014-11-30 DIAGNOSIS — Z792 Long term (current) use of antibiotics: Secondary | ICD-10-CM | POA: Insufficient documentation

## 2014-11-30 DIAGNOSIS — K219 Gastro-esophageal reflux disease without esophagitis: Secondary | ICD-10-CM | POA: Insufficient documentation

## 2014-11-30 DIAGNOSIS — R1013 Epigastric pain: Secondary | ICD-10-CM | POA: Insufficient documentation

## 2014-11-30 DIAGNOSIS — R1010 Upper abdominal pain, unspecified: Secondary | ICD-10-CM

## 2014-11-30 DIAGNOSIS — Z975 Presence of (intrauterine) contraceptive device: Secondary | ICD-10-CM | POA: Diagnosis not present

## 2014-11-30 DIAGNOSIS — Z87891 Personal history of nicotine dependence: Secondary | ICD-10-CM | POA: Diagnosis not present

## 2014-11-30 LAB — URINALYSIS, ROUTINE W REFLEX MICROSCOPIC
BILIRUBIN URINE: NEGATIVE
Glucose, UA: NEGATIVE mg/dL
Ketones, ur: NEGATIVE mg/dL
Leukocytes, UA: NEGATIVE
Nitrite: NEGATIVE
PH: 6 (ref 5.0–8.0)
PROTEIN: NEGATIVE mg/dL
Specific Gravity, Urine: >1.03 — ABNORMAL HIGH (ref 1.005–1.030)
Urobilinogen, UA: 0.2 mg/dL (ref 0.0–1.0)

## 2014-11-30 LAB — CBC WITH DIFFERENTIAL/PLATELET
BASOS PCT: 0 % (ref 0–1)
Basophils Absolute: 0 10*3/uL (ref 0.0–0.1)
EOS ABS: 0 10*3/uL (ref 0.0–0.7)
Eosinophils Relative: 0 % (ref 0–5)
HCT: 40 % (ref 36.0–46.0)
HEMOGLOBIN: 13.3 g/dL (ref 12.0–15.0)
LYMPHS ABS: 2.2 10*3/uL (ref 0.7–4.0)
Lymphocytes Relative: 25 % (ref 12–46)
MCH: 31.6 pg (ref 26.0–34.0)
MCHC: 33.3 g/dL (ref 30.0–36.0)
MCV: 95 fL (ref 78.0–100.0)
MONO ABS: 0.6 10*3/uL (ref 0.1–1.0)
Monocytes Relative: 6 % (ref 3–12)
NEUTROS ABS: 6.2 10*3/uL (ref 1.7–7.7)
NEUTROS PCT: 69 % (ref 43–77)
PLATELETS: 236 10*3/uL (ref 150–400)
RBC: 4.21 MIL/uL (ref 3.87–5.11)
RDW: 14.5 % (ref 11.5–15.5)
WBC: 9.1 10*3/uL (ref 4.0–10.5)

## 2014-11-30 LAB — PREGNANCY, URINE: PREG TEST UR: NEGATIVE

## 2014-11-30 LAB — COMPREHENSIVE METABOLIC PANEL
ALK PHOS: 123 U/L — AB (ref 39–117)
ALT: 16 U/L (ref 0–35)
AST: 36 U/L (ref 0–37)
Albumin: 4.3 g/dL (ref 3.5–5.2)
Anion gap: 8 (ref 5–15)
BUN: 10 mg/dL (ref 6–23)
CALCIUM: 9.4 mg/dL (ref 8.4–10.5)
CHLORIDE: 110 meq/L (ref 96–112)
CO2: 24 mmol/L (ref 19–32)
CREATININE: 0.85 mg/dL (ref 0.50–1.10)
GFR calc Af Amer: >90 mL/min (ref >90)
Glucose, Bld: 81 mg/dL (ref 70–99)
Potassium: 3.3 mmol/L — ABNORMAL LOW (ref 3.5–5.1)
Sodium: 142 mmol/L (ref 135–145)
Total Bilirubin: 0.5 mg/dL (ref 0.3–1.2)
Total Protein: 7.8 g/dL (ref 6.0–8.3)

## 2014-11-30 LAB — URINE MICROSCOPIC-ADD ON

## 2014-11-30 LAB — LIPASE, BLOOD: Lipase: 42 U/L (ref 11–59)

## 2014-11-30 MED ORDER — PANTOPRAZOLE SODIUM 40 MG IV SOLR
40.0000 mg | Freq: Once | INTRAVENOUS | Status: AC
  Administered 2014-11-30: 40 mg via INTRAVENOUS
  Filled 2014-11-30: qty 40

## 2014-11-30 MED ORDER — ONDANSETRON 4 MG PO TBDP: ORAL_TABLET | ORAL | Status: DC

## 2014-11-30 MED ORDER — HYDROMORPHONE HCL 1 MG/ML IJ SOLN
1.0000 mg | Freq: Once | INTRAMUSCULAR | Status: AC
  Administered 2014-11-30: 1 mg via INTRAVENOUS
  Filled 2014-11-30: qty 1

## 2014-11-30 MED ORDER — POTASSIUM CHLORIDE CRYS ER 20 MEQ PO TBCR
40.0000 meq | EXTENDED_RELEASE_TABLET | Freq: Once | ORAL | Status: AC
  Administered 2014-11-30: 40 meq via ORAL
  Filled 2014-11-30: qty 2

## 2014-11-30 MED ORDER — PANTOPRAZOLE SODIUM 40 MG PO TBEC: 40.0000 mg | DELAYED_RELEASE_TABLET | Freq: Every day | ORAL | Status: DC

## 2014-11-30 MED ORDER — SODIUM CHLORIDE 0.9 % IV BOLUS (SEPSIS)
1000.0000 mL | Freq: Once | INTRAVENOUS | Status: AC
  Administered 2014-11-30: 1000 mL via INTRAVENOUS

## 2014-11-30 MED ORDER — ONDANSETRON HCL 4 MG/2ML IJ SOLN
4.0000 mg | Freq: Once | INTRAMUSCULAR | Status: AC
  Administered 2014-11-30: 4 mg via INTRAVENOUS
  Filled 2014-11-30: qty 2

## 2014-11-30 NOTE — ED Notes (Signed)
Pt states she is hurting at the top of her stomach, nauseated, pt has been seen multiple times in the ER for same. Denies emesis at this time.

## 2014-11-30 NOTE — ED Notes (Signed)
MD at bedside. 

## 2014-11-30 NOTE — Discharge Instructions (Signed)
Abdominal Pain °Many things can cause abdominal pain. Usually, abdominal pain is not caused by a disease and will improve without treatment. It can often be observed and treated at home. Your health care provider will do a physical exam and possibly order blood tests and X-rays to help determine the seriousness of your pain. However, in many cases, more time must pass before a clear cause of the pain can be found. Before that point, your health care provider may not know if you need more testing or further treatment. °HOME CARE INSTRUCTIONS  °Monitor your abdominal pain for any changes. The following actions may help to alleviate any discomfort you are experiencing: °· Only take over-the-counter or prescription medicines as directed by your health care provider. °· Do not take laxatives unless directed to do so by your health care provider. °· Try a clear liquid diet (broth, tea, or water) as directed by your health care provider. Slowly move to a bland diet as tolerated. °SEEK MEDICAL CARE IF: °· You have unexplained abdominal pain. °· You have abdominal pain associated with nausea or diarrhea. °· You have pain when you urinate or have a bowel movement. °· You experience abdominal pain that wakes you in the night. °· You have abdominal pain that is worsened or improved by eating food. °· You have abdominal pain that is worsened with eating fatty foods. °· You have a fever. °SEEK IMMEDIATE MEDICAL CARE IF:  °· Your pain does not go away within 2 hours. °· You keep throwing up (vomiting). °· Your pain is felt only in portions of the abdomen, such as the right side or the left lower portion of the abdomen. °· You pass bloody or black tarry stools. °MAKE SURE YOU: °· Understand these instructions.   °· Will watch your condition.   °· Will get help right away if you are not doing well or get worse.   °Document Released: 08/12/2005 Document Revised: 11/07/2013 Document Reviewed: 07/12/2013 °Exit[employer]® Patient Information  ©2015 [employer]. This information is not intended to replace advice given to you by your health care provider. Make sure you discuss any questions you have with your health care provider. ° °Gastritis, Adult °Gastritis is soreness and swelling (inflammation) of the lining of the stomach. Gastritis can develop as a sudden onset (acute) or long-term (chronic) condition. If gastritis is not treated, it can lead to stomach bleeding and ulcers. °CAUSES  °Gastritis occurs when the stomach lining is weak or damaged. Digestive juices from the stomach then inflame the weakened stomach lining. The stomach lining may be weak or damaged due to viral or bacterial infections. One common bacterial infection is the Helicobacter pylori infection. Gastritis can also result from excessive alcohol consumption, taking certain medicines, or having too much acid in the stomach.  °SYMPTOMS  °In some cases, there are no symptoms. When symptoms are present, they may include: °· Pain or a burning sensation in the upper abdomen. °· Nausea. °· Vomiting. °· An uncomfortable feeling of fullness after eating. °DIAGNOSIS  °Your caregiver may suspect you have gastritis based on your symptoms and a physical exam. To determine the cause of your gastritis, your caregiver may perform the following: °· Blood or stool tests to check for the H pylori bacterium. °· Gastroscopy. A thin, flexible tube (endoscope) is passed down the esophagus and into the stomach. The endoscope has a light and camera on the end. Your caregiver uses the endoscope to view the inside of the stomach. °· Taking a tissue sample (biopsy)   from the stomach to examine under a microscope. °TREATMENT  °Depending on the cause of your gastritis, medicines may be prescribed. If you have a bacterial infection, such as an H pylori infection, antibiotics may be given. If your gastritis is caused by too much acid in the stomach, H2 blockers or antacids may be given. Your caregiver may  recommend that you stop taking aspirin, ibuprofen, or other nonsteroidal anti-inflammatory drugs (NSAIDs). °HOME CARE INSTRUCTIONS °· Only take over-the-counter or prescription medicines as directed by your caregiver. °· If you were given antibiotic medicines, take them as directed. Finish them even if you start to feel better. °· Drink enough fluids to keep your urine clear or pale yellow. °· Avoid foods and drinks that make your symptoms worse, such as: °¨ Caffeine or alcoholic drinks. °¨ Chocolate. °¨ Peppermint or mint flavorings. °¨ Garlic and onions. °¨ Spicy foods. °¨ Citrus fruits, such as oranges, lemons, or limes. °¨ Tomato-based foods such as sauce, chili, salsa, and pizza. °¨ Fried and fatty foods. °· Eat small, frequent meals instead of large meals. °SEEK IMMEDIATE MEDICAL CARE IF:  °· You have black or dark red stools. °· You vomit blood or material that looks like coffee grounds. °· You are unable to keep fluids down. °· Your abdominal pain gets worse. °· You have a fever. °· You do not feel better after 1 week. °· You have any other questions or concerns. °MAKE SURE YOU: °· Understand these instructions. °· Will watch your condition. °· Will get help right away if you are not doing well or get worse. °Document Released: 10/27/2001 Document Revised: 05/03/2012 Document Reviewed: 12/16/2011 °ExitCare® Patient Information ©2015 [employer]. This information is not intended to replace advice given to you by your health care provider. Make sure you discuss any questions you have with your health care provider. ° °

## 2014-11-30 NOTE — ED Provider Notes (Signed)
CSN: [id number]     Arrival date & time 11/30/14  1308 History   First MD Initiated Contact with Patient 11/30/14 1510     Chief Complaint  Patient presents with  . Abdominal Pain     (Consider location/radiation/quality/duration/timing/severity/associated sxs/prior Treatment) HPI  29 year old female presents with upper abdominal pain for the past 2-3 days. This pain is similar to pain she is been to the ER for multiple times. The patient was last admitted on 12/27. She was treated for pneumonia though never had cough. She had a follow-up chest x-ray by her PCP that was negative. No cough or shortness of breath currently. Denies any fevers but has been having chills. Has been having increased urinary frequency without dysuria for the past 1 week. Has irregular menstrual periods and is concerned she might be pregnant. Denies any back pain. No lower abdominal pain. Currently bleeding now. The patient states that otherwise this pain is not different than normal except that she is nauseous only and has no vomiting or diarrhea. Last had a bowel movement this morning. Rates the pain as a 6 out of 10. It is a sharp pain that does not get worse with eating. Has tried tylenol without relief.  Past Medical History  Diagnosis Date  . Herpes   . GERD (gastroesophageal reflux disease)   . Gastritis   . Disk prolapse   . Bulging disc   . Obesity   . Mental disorder     anxiety  . Other and unspecified ovarian cyst 12/01/2013  . Elevated liver enzymes 12/01/2013  . Ovarian cyst, right   . Headache   . Abdominal pain, chronic, right lower quadrant   . Abdominal pain, chronic, epigastric   . Contraceptive management 07/18/2014   Past Surgical History  Procedure Laterality Date  . Wisdom tooth extraction    . Esophagogastroduodenoscopy  01/19/2012    WUJ:[id number],NWGNFAOZ IN THE ANTRUM/HIATAL HERNIA/  . Colonoscopy with esophagogastroduodenoscopy (egd) N/A 02/24/2013    [id number] RECTAL BLEEDING DUE  TO Moderate sized internal hemorrhoids, EGD: erosive esophagitis due to uncontrolled reflux, moderate erosive gastritis, benign path   Family History  Problem Relation Age of Onset  . Diabetes Mother   . Diabetes Other   . Colon cancer Neg Hx   . Colon polyps Neg Hx   . Diabetes Maternal Grandmother   . COPD Maternal Grandmother   . Diabetes Maternal Grandfather    History  Substance Use Topics  . Smoking status: Former Smoker -- 0.25 packs/day for .5 years    Types: Cigarettes    Quit date: 07/28/2011  . Smokeless tobacco: Never Used  . Alcohol Use: No   OB History    Gravida Para Term Preterm AB TAB SAB Ectopic Multiple Living   Review of Systems  Constitutional: Positive for chills. Negative for fever.  Respiratory: Negative for cough and shortness of breath.   Cardiovascular: Negative for chest pain.  Gastrointestinal: Positive for nausea and abdominal pain. Negative for vomiting, diarrhea and constipation.  Genitourinary: Positive for frequency, vaginal bleeding and menstrual problem. Negative for dysuria.  Musculoskeletal: Negative for back pain.  All other systems reviewed and are negative.     Allergies  Review of patient's allergies indicates no known allergies.  Home Medications   Prior to Admission medications   Medication Sig Start Date End Date Taking? Authorizing Provider  azithromycin (ZITHROMAX Z-PAK) 250 MG tablet 2 po day  one, then 1 daily x 4 days Patient not taking: Reported on 11/30/2014 11/11/14   Loren Racer, MD  DULoxetine (CYMBALTA) 60 MG capsule Take 60 mg by mouth at bedtime.    Historical Provider, MD  HYDROcodone-acetaminophen (NORCO) 5-325 MG per tablet Take 1-2 tablets by mouth every 4 (four) hours as needed for severe pain. Patient not taking: Reported on 11/30/2014 11/11/14   Loren Racer, MD  LYRICA 75 MG capsule Take 75 mg by mouth 2 (two) times daily. 10/15/14   Historical Provider, MD  norethindrone-ethinyl  estradiol (OVCON-50) 1-50 MG-MCG tablet Take 1 tablet by mouth daily. 07/18/14   Adline Potter, NP  ondansetron (ZOFRAN ODT) 4 MG disintegrating tablet  ODT q4 hours prn nausea/vomit Patient not taking: Reported on 11/30/2014 11/11/14   Loren Racer, MD  pantoprazole (PROTONIX) 40 MG tablet TAKE 1 TABLET BY MOUTH TWICE A DAY    Tiffany Kocher, PA-C  sucralfate (CARAFATE) 1 GM/10ML suspension Take 10 mLs (1 g total) by mouth 3 (three) times daily as needed (Epigastric pain). 11/11/14   Loren Racer, MD  tiZANidine (ZANAFLEX) 2 MG tablet Take 2 mg by mouth at bedtime.     Historical Provider, MD  valACYclovir (VALTREX) 1000 MG tablet TAKE 1 TABLET EVERY DAY    Adline Potter, NP   BP 119/70 mmHg  Pulse 93  Temp(Src) 98.6 F (37 C) (Oral)  Resp 18  Ht  (1.676 m)  Wt 278 lb (126.1 kg)  BMI 44.89 kg/m2  SpO2 100%  LMP 11/30/2014 Physical Exam  Constitutional: She is oriented to person, place, and time. She appears well-developed and well-nourished.  HENT:  Head: Normocephalic and atraumatic.  Right Ear: External ear normal.  Left Ear: External ear normal.  Nose: Nose normal.  Eyes: Right eye exhibits no discharge. Left eye exhibits no discharge.  Cardiovascular: Normal rate, regular rhythm and normal heart sounds.   Pulmonary/Chest: Effort normal and breath sounds normal.  Abdominal: Soft. Normal appearance. There is tenderness (mild) in the epigastric area.  Neurological: She is alert and oriented to person, place, and time.  Skin: Skin is warm and dry.  Nursing note and vitals reviewed.   ED Course  Procedures (including critical care time) Labs Review Labs Reviewed  COMPREHENSIVE METABOLIC PANEL - Abnormal; Notable for the following:    Potassium 3.3 (*)    Alkaline Phosphatase 123 (*)    All other components within normal limits  URINALYSIS, ROUTINE W REFLEX MICROSCOPIC - Abnormal; Notable for the following:    APPearance HAZY (*)    Specific Gravity,  Urine >1.030 (*)    Hgb urine dipstick LARGE (*)    All other components within normal limits  URINE MICROSCOPIC-ADD ON - Abnormal; Notable for the following:    Squamous Epithelial / LPF FEW (*)    All other components within normal limits  LIPASE, BLOOD  CBC WITH DIFFERENTIAL  PREGNANCY, URINE    Imaging Review Dg Chest 2 View  11/28/2014   CLINICAL DATA:  History of pneumonia, nonproductive cough  EXAM: CHEST  2 VIEW  COMPARISON:  Chest x-ray of 06/08/2014  FINDINGS: No active infiltrate or effusion is seen. Mediastinal and hilar contours are unremarkable. The heart is within normal limits in size. No bony abnormality is seen.  IMPRESSION: No active cardiopulmonary disease.   Electronically Signed   By: Dwyane Dee M.D.   On: 11/28/2014 16:27     EKG Interpretation None      MDM  Final diagnoses:  Upper abdominal pain    Patient's lab work is unremarkable. No vomiting in the ER. She indicates this pain is similar to multiple prior presentations and no lower abdominal pain or red flags. Pain seems improved with IV medicines. At this point she denies being on any antacids or PPIs, will start on protonix, give anti-emetics, and recommend follow-up with PCP as an outpatient.    Audree CamelScott T Abdelaziz Westenberger, MD 11/30/14 [phone]

## 2014-12-12 ENCOUNTER — Emergency Department (HOSPITAL_COMMUNITY): Payer: Medicaid Other

## 2014-12-12 ENCOUNTER — Encounter (HOSPITAL_COMMUNITY): Payer: Self-pay | Admitting: Cardiology

## 2014-12-12 ENCOUNTER — Telehealth: Payer: Self-pay | Admitting: Gastroenterology

## 2014-12-12 ENCOUNTER — Emergency Department (HOSPITAL_COMMUNITY)
Admission: EM | Admit: 2014-12-12 | Discharge: 2014-12-12 | Disposition: A | Discharge status: Home / Self Care | Payer: Medicaid Other | Attending: Emergency Medicine | Admitting: Emergency Medicine

## 2014-12-12 DIAGNOSIS — G8929 Other chronic pain: Secondary | ICD-10-CM | POA: Insufficient documentation

## 2014-12-12 DIAGNOSIS — Z9889 Other specified postprocedural states: Secondary | ICD-10-CM | POA: Insufficient documentation

## 2014-12-12 DIAGNOSIS — Z79899 Other long term (current) drug therapy: Secondary | ICD-10-CM | POA: Insufficient documentation

## 2014-12-12 DIAGNOSIS — Z8739 Personal history of other diseases of the musculoskeletal system and connective tissue: Secondary | ICD-10-CM | POA: Insufficient documentation

## 2014-12-12 DIAGNOSIS — K2970 Gastritis, unspecified, without bleeding: Secondary | ICD-10-CM | POA: Diagnosis not present

## 2014-12-12 DIAGNOSIS — E669 Obesity, unspecified: Secondary | ICD-10-CM | POA: Insufficient documentation

## 2014-12-12 DIAGNOSIS — Z8659 Personal history of other mental and behavioral disorders: Secondary | ICD-10-CM | POA: Insufficient documentation

## 2014-12-12 DIAGNOSIS — R52 Pain, unspecified: Secondary | ICD-10-CM

## 2014-12-12 DIAGNOSIS — Z3202 Encounter for pregnancy test, result negative: Secondary | ICD-10-CM | POA: Diagnosis not present

## 2014-12-12 DIAGNOSIS — Z8742 Personal history of other diseases of the female genital tract: Secondary | ICD-10-CM | POA: Diagnosis not present

## 2014-12-12 DIAGNOSIS — Z8619 Personal history of other infectious and parasitic diseases: Secondary | ICD-10-CM | POA: Insufficient documentation

## 2014-12-12 DIAGNOSIS — K219 Gastro-esophageal reflux disease without esophagitis: Secondary | ICD-10-CM | POA: Insufficient documentation

## 2014-12-12 DIAGNOSIS — R1033 Periumbilical pain: Secondary | ICD-10-CM | POA: Diagnosis present

## 2014-12-12 DIAGNOSIS — Z87891 Personal history of nicotine dependence: Secondary | ICD-10-CM | POA: Diagnosis not present

## 2014-12-12 LAB — URINALYSIS, ROUTINE W REFLEX MICROSCOPIC
Bilirubin Urine: NEGATIVE
GLUCOSE, UA: NEGATIVE mg/dL
KETONES UR: 15 mg/dL — AB
Leukocytes, UA: NEGATIVE
Nitrite: NEGATIVE
PH: 6 (ref 5.0–8.0)
PROTEIN: NEGATIVE mg/dL
Urobilinogen, UA: 0.2 mg/dL (ref 0.0–1.0)

## 2014-12-12 LAB — COMPREHENSIVE METABOLIC PANEL
ALT: 16 U/L (ref 0–35)
AST: 37 U/L (ref 0–37)
Albumin: 4.3 g/dL (ref 3.5–5.2)
Alkaline Phosphatase: 124 U/L — ABNORMAL HIGH (ref 39–117)
Anion gap: 7 (ref 5–15)
BUN: 7 mg/dL (ref 6–23)
CALCIUM: 9.5 mg/dL (ref 8.4–10.5)
CO2: 27 mmol/L (ref 19–32)
CREATININE: 0.81 mg/dL (ref 0.50–1.10)
Chloride: 107 mmol/L (ref 96–112)
GFR calc Af Amer: >90 mL/min (ref >90)
GLUCOSE: 102 mg/dL — AB (ref 70–99)
POTASSIUM: 3.9 mmol/L (ref 3.5–5.1)
Sodium: 141 mmol/L (ref 135–145)
TOTAL PROTEIN: 7.9 g/dL (ref 6.0–8.3)
Total Bilirubin: 0.6 mg/dL (ref 0.3–1.2)

## 2014-12-12 LAB — CBC WITH DIFFERENTIAL/PLATELET
BASOS ABS: 0 10*3/uL (ref 0.0–0.1)
Basophils Relative: 0 % (ref 0–1)
EOS PCT: 1 % (ref 0–5)
Eosinophils Absolute: 0.1 10*3/uL (ref 0.0–0.7)
HEMATOCRIT: 44.7 % (ref 36.0–46.0)
Hemoglobin: 15.2 g/dL — ABNORMAL HIGH (ref 12.0–15.0)
LYMPHS ABS: 2.2 10*3/uL (ref 0.7–4.0)
Lymphocytes Relative: 26 % (ref 12–46)
MCH: 32.1 pg (ref 26.0–34.0)
MCHC: 34 g/dL (ref 30.0–36.0)
MCV: 94.5 fL (ref 78.0–100.0)
MONO ABS: 0.6 10*3/uL (ref 0.1–1.0)
MONOS PCT: 7 % (ref 3–12)
Neutro Abs: 5.5 10*3/uL (ref 1.7–7.7)
Neutrophils Relative %: 66 % (ref 43–77)
Platelets: 291 10*3/uL (ref 150–400)
RBC: 4.73 MIL/uL (ref 3.87–5.11)
RDW: 14 % (ref 11.5–15.5)
WBC: 8.3 10*3/uL (ref 4.0–10.5)

## 2014-12-12 LAB — PREGNANCY, URINE: Preg Test, Ur: NEGATIVE

## 2014-12-12 LAB — URINE MICROSCOPIC-ADD ON

## 2014-12-12 LAB — LIPASE, BLOOD: Lipase: 38 U/L (ref 11–59)

## 2014-12-12 MED ORDER — ONDANSETRON 4 MG PO TBDP
4.0000 mg | ORAL_TABLET | Freq: Once | ORAL | Status: AC
  Administered 2014-12-12: 4 mg via ORAL

## 2014-12-12 MED ORDER — CEPHALEXIN 500 MG PO CAPS
500.0000 mg | ORAL_CAPSULE | Freq: Once | ORAL | Status: AC
  Administered 2014-12-12: 500 mg via ORAL
  Filled 2014-12-12: qty 1

## 2014-12-12 MED ORDER — SODIUM CHLORIDE 0.9 % IV BOLUS (SEPSIS)
1000.0000 mL | Freq: Once | INTRAVENOUS | Status: AC
  Administered 2014-12-12: 1000 mL via INTRAVENOUS

## 2014-12-12 MED ORDER — ONDANSETRON 4 MG PO TBDP
ORAL_TABLET | ORAL | Status: AC
  Filled 2014-12-12: qty 1

## 2014-12-12 MED ORDER — HYDROMORPHONE HCL 1 MG/ML IJ SOLN
1.0000 mg | Freq: Once | INTRAMUSCULAR | Status: AC
  Administered 2014-12-12: 1 mg via INTRAVENOUS
  Filled 2014-12-12: qty 1

## 2014-12-12 MED ORDER — ONDANSETRON HCL 4 MG/2ML IJ SOLN
4.0000 mg | Freq: Once | INTRAMUSCULAR | Status: AC
  Administered 2014-12-12: 4 mg via INTRAVENOUS
  Filled 2014-12-12: qty 2

## 2014-12-12 MED ORDER — LORAZEPAM 2 MG/ML IJ SOLN
1.0000 mg | Freq: Once | INTRAMUSCULAR | Status: AC
  Administered 2014-12-12: 1 mg via INTRAVENOUS
  Filled 2014-12-12: qty 1

## 2014-12-12 MED ORDER — ONDANSETRON 4 MG PO TBDP: ORAL_TABLET | ORAL | Status: DC

## 2014-12-12 MED ORDER — HYDROCODONE-ACETAMINOPHEN 5-325 MG PO TABS: 1.0000 | ORAL_TABLET | Freq: Four times a day (QID) | ORAL | Status: DC | PRN

## 2014-12-12 MED ORDER — PANTOPRAZOLE SODIUM 40 MG IV SOLR
40.0000 mg | Freq: Once | INTRAVENOUS | Status: AC
  Administered 2014-12-12: 40 mg via INTRAVENOUS
  Filled 2014-12-12: qty 40

## 2014-12-12 MED ORDER — CEPHALEXIN 500 MG PO CAPS: 500.0000 mg | ORAL_CAPSULE | Freq: Four times a day (QID) | ORAL | Status: DC

## 2014-12-12 NOTE — Telephone Encounter (Signed)
Patient called today after 4pm asking if she could see SF sooner than 2/17 because she has been throwing up and can not keep anything down. I told her that I doubt I would have anything sooner with SF unless she wanted to see NP or PA. Patient wants SF ONLY. Patient also has several no shows dating back to November 2015. She does have OV with SF for 2/17 at 11 and wants to know what SF would recommend or call in for her. She uses [facility] in [place]. Please advise [phone]

## 2014-12-12 NOTE — ED Provider Notes (Signed)
CSN: [id number]     Arrival date & time 12/12/14  1639 History  This chart was scribed for Isabel Lennert, MD by Tonye Royalty, ED Scribe. This patient was seen in room APA01/APA01 and the patient's care was started at 5:31 PM.    Chief Complaint  Patient presents with  . Abdominal Pain   Patient is a 29 y.o. female presenting with abdominal pain. The history is provided by the patient. No language interpreter was used.  Abdominal Pain Pain location:  Periumbilical Pain radiates to:  Does not radiate Pain severity:  Moderate Onset quality:  Gradual Duration:  3 weeks Timing:  Constant Progression:  Unchanged Chronicity:  Recurrent Context: previous surgery   Relieved by:  Nothing Worsened by:  Nothing tried Ineffective treatments:  Acetaminophen (anti-emetics, protonix) Associated symptoms: diarrhea, nausea and vomiting   Associated symptoms: no chest pain, no cough, no fatigue and no hematuria   Risk factors: obesity     HPI Comments: [name] is a 29 y.o. female who presents to the Emergency Department complaining of abdominal pain with onset 3 weeks ago. She reports associated vomiting and diarrhea. She states she has been evaluated here this complaint previously. She states she has called her GI but his earliest available appointment is on 2/17. She is prescribed Protonix for gastritis.  GI: Dr. Darrick Penna PCP: Alice Reichert, MD   Past Medical History  Diagnosis Date  . Herpes   . GERD (gastroesophageal reflux disease)   . Gastritis   . Disk prolapse   . Bulging disc   . Obesity   . Mental disorder     anxiety  . Other and unspecified ovarian cyst 12/01/2013  . Elevated liver enzymes 12/01/2013  . Ovarian cyst, right   . Headache   . Abdominal pain, chronic, right lower quadrant   . Abdominal pain, chronic, epigastric   . Contraceptive management 07/18/2014   Past Surgical History  Procedure Laterality Date  . Wisdom tooth extraction    .  Esophagogastroduodenoscopy  01/19/2012    [id number]:[id number],QMVHQION IN THE ANTRUM/HIATAL HERNIA/  . Colonoscopy with esophagogastroduodenoscopy (egd) N/A 02/24/2013    [id number] RECTAL BLEEDING DUE TO Moderate sized internal hemorrhoids, EGD: erosive esophagitis due to uncontrolled reflux, moderate erosive gastritis, benign path   Family History  Problem Relation Age of Onset  . Diabetes Mother   . Diabetes Other   . Colon cancer Neg Hx   . Colon polyps Neg Hx   . Diabetes Maternal Grandmother   . COPD Maternal Grandmother   . Diabetes Maternal Grandfather    History  Substance Use Topics  . Smoking status: Former Smoker -- 0.25 packs/day for .5 years    Types: Cigarettes    Quit date: 07/28/2011  . Smokeless tobacco: Never Used  . Alcohol Use: No   OB History    Gravida Para Term Preterm AB TAB SAB Ectopic Multiple Living   Review of Systems  Constitutional: Negative for appetite change and fatigue.  HENT: Negative for congestion, ear discharge and sinus pressure.   Eyes: Negative for discharge.  Respiratory: Negative for cough.   Cardiovascular: Negative for chest pain.  Gastrointestinal: Positive for nausea, vomiting, abdominal pain and diarrhea.  Genitourinary: Negative for frequency and hematuria.  Musculoskeletal: Negative for back pain.  Skin: Negative for rash.  Neurological: Negative for seizures and headaches.  Psychiatric/Behavioral: Negative for hallucinations.      Allergies  Review of patient's allergies indicates no known allergies.  Home Medications   Prior to Admission medications   Medication Sig Start Date End Date Taking? Authorizing Provider  azithromycin (ZITHROMAX Z-PAK) 250 MG tablet 2 po day one, then 1 daily x 4 days Patient not taking: Reported on 11/30/2014 11/11/14   Loren Raceravid [name], MD  DULoxetine (CYMBALTA) 60 MG capsule Take 60 mg by mouth at bedtime.    Historical Provider, MD  HYDROcodone-acetaminophen (NORCO)  5-325 MG per tablet Take 1-2 tablets by mouth every 4 (four) hours as needed for severe pain. Patient not taking: Reported on 11/30/2014 11/11/14   Loren Raceravid [name], MD  LYRICA 75 MG capsule Take 75 mg by mouth 2 (two) times daily. 10/15/14   Historical Provider, MD  norethindrone-ethinyl estradiol (OVCON-50) 1-50 MG-MCG tablet Take 1 tablet by mouth daily. 07/18/14   Adline PotterJennifer A Griffin, NP  ondansetron (ZOFRAN ODT) 4 MG disintegrating tablet 4mg  ODT q4 hours prn nausea/vomiting 11/30/14   Audree CamelScott T Goldston, MD  pantoprazole (PROTONIX) 40 MG tablet Take 1 tablet (40 mg total) by mouth daily. 11/30/14   Audree CamelScott T Goldston, MD  sucralfate (CARAFATE) 1 GM/10ML suspension Take 10 mLs (1 g total) by mouth 3 (three) times daily as needed (Epigastric pain). 11/11/14   Loren Raceravid [name], MD  tiZANidine (ZANAFLEX) 2 MG tablet Take 2 mg by mouth at bedtime.     Historical Provider, MD  valACYclovir (VALTREX) 1000 MG tablet TAKE 1 TABLET EVERY DAY    Adline PotterJennifer A Griffin, NP   BP 148/100 mmHg  Pulse 87  Temp(Src) 98.6 F (37 C) (Oral)  Resp 18  Ht 5\' 6"  (1.676 m)  Wt 278 lb (126.1 kg)  BMI 44.89 kg/m2  SpO2 100%  LMP 11/30/2014 Physical Exam  Constitutional: She is oriented to person, place, and time. She appears well-developed.  HENT:  Head: Normocephalic.  Eyes: Conjunctivae and EOM are normal. No scleral icterus.  Neck: Neck supple. No thyromegaly present.  Cardiovascular: Normal rate and regular rhythm.  Exam reveals no gallop and no friction rub.   No murmur heard. Pulmonary/Chest: No stridor. She has no wheezes. She has no rales. She exhibits no tenderness.  Abdominal: She exhibits no distension. There is tenderness (moderate) in the periumbilical area. There is no rebound.  Musculoskeletal: Normal range of motion. She exhibits no edema.  Lymphadenopathy:    She has no cervical adenopathy.  Neurological: She is oriented to person, place, and time. She exhibits normal muscle tone. Coordination normal.   Skin: No rash noted. No erythema.  Psychiatric: She has a normal mood and affect. Her behavior is normal.  Nursing note and vitals reviewed.   ED Course  Procedures (including critical care time)  DIAGNOSTIC STUDIES: Oxygen Saturation is 100% on room air, normal by my interpretation.    COORDINATION OF CARE: 5:33 PM Discussed treatment plan with patient at beside, the patient agrees with the plan and has no further questions at this time.   Labs Review Labs Reviewed  CBC WITH DIFFERENTIAL/PLATELET  BASIC METABOLIC PANEL  URINALYSIS, ROUTINE W REFLEX MICROSCOPIC  PREGNANCY, URINE    Imaging Review No results found.   EKG Interpretation None      MDM   Final diagnoses:  None    Gastritis,   The chart was scribed for me under my direct supervision.  I personally performed the history, physical, and medical decision making and all procedures in the evaluation of this patient.[name].   Gayland Nicol L Harvie Morua, MD 12/12/14 [phone]

## 2014-12-12 NOTE — ED Notes (Signed)
Pt has not vomited since this nurse has been on shift at 1900.

## 2014-12-12 NOTE — ED Notes (Signed)
Pt up to restroom.

## 2014-12-12 NOTE — ED Notes (Signed)
Abdominal pain, vomiting and diarrhea times 3 weeks.

## 2014-12-12 NOTE — Discharge Instructions (Signed)
Drink plenty of fluids.  Increase protonix to twice a day.   Follow up with your md next week.

## 2014-12-12 NOTE — Telephone Encounter (Signed)
I spoke to pt and she said she has been vomiting for 3 weeks and cannot keep anything down, then she said she has kept very little fluids down. I told her if she has been vomiting for 3 weeks she should go to the ED.

## 2014-12-13 NOTE — Telephone Encounter (Signed)
REVIEWED. AGREE. NO ADDITIONAL RECOMMENDATIONS. 

## 2015-01-02 ENCOUNTER — Ambulatory Visit: Payer: Medicaid Other | Admitting: Gastroenterology

## 2015-01-17 ENCOUNTER — Encounter: Payer: Self-pay | Admitting: Gastroenterology

## 2015-01-17 ENCOUNTER — Ambulatory Visit (INDEPENDENT_AMBULATORY_CARE_PROVIDER_SITE_OTHER): Payer: Medicaid Other | Admitting: Gastroenterology

## 2015-01-17 VITALS — BP 106/65 | HR 78 | Temp 97.0°F | Ht 66.0 in | Wt 275.0 lb

## 2015-01-17 DIAGNOSIS — K219 Gastro-esophageal reflux disease without esophagitis: Secondary | ICD-10-CM

## 2015-01-17 MED ORDER — PANTOPRAZOLE SODIUM 40 MG PO TBEC: DELAYED_RELEASE_TABLET | ORAL | Status: DC

## 2015-01-17 NOTE — Patient Instructions (Signed)
IF YOU EAT THE WRONG THING, YOU WILL THROW UP.  CONTINUE YOUR WEIGHT LOSS EFFORTS. LOSE 10 LBS  FOLLOW A LOW FAT DIET. SEE INFO BELOW.  CONTINUE PROTONIX. TAKE 30 MINUTES PRIOR TO MEALS TWICE DAILY.  FOLLOW UP IN 4 MOS.   Low-Fat Diet BREADS, CEREALS, PASTA, RICE, DRIED PEAS, AND BEANS These products are high in carbohydrates and most are low in fat. Therefore, they can be increased in the diet as substitutes for fatty foods. They too, however, contain calories and should not be eaten in excess. Cereals can be eaten for snacks as well as for breakfast.   FRUITS AND VEGETABLES It is good to eat fruits and vegetables. Besides being sources of fiber, both are rich in vitamins and some minerals. They help you get the daily allowances of these nutrients. Fruits and vegetables can be used for snacks and desserts.  MEATS Limit lean meat, chicken, [place], and fish to no more than 6 ounces per day. Beef, Pork, and Lamb Use lean cuts of beef, pork, and lamb. Lean cuts include:  Extra-lean ground beef.  Arm roast.  Sirloin tip.  Center-cut ham.  Round steak.  Loin chops.  Rump roast.  Tenderloin.  Trim all fat off the outside of meats before cooking. It is not necessary to severely decrease the intake of red meat, but lean choices should be made. Lean meat is rich in protein and contains a highly absorbable form of iron. Premenopausal women, in particular, should avoid reducing lean red meat because this could increase the risk for low red blood cells (iron-deficiency anemia).  Chicken and [place] These are good sources of protein. The fat of poultry can be reduced by removing the skin and underlying fat layers before cooking. Chicken and [place] can be substituted for lean red meat in the diet. Poultry should not be fried or covered with high-fat sauces. Fish and Shellfish Fish is a good source of protein. Shellfish contain cholesterol, but they usually are low in saturated fatty acids. The  preparation of fish is important. Like chicken and [place], they should not be fried or covered with high-fat sauces. EGGS Egg whites contain no fat or cholesterol. They can be eaten often. Try 1 to 2 egg whites instead of whole eggs in recipes or use egg substitutes that do not contain yolk. MILK AND DAIRY PRODUCTS Use skim or 1% milk instead of 2% or whole milk. Decrease whole milk, natural, and processed cheeses. Use nonfat or low-fat (2%) cottage cheese or low-fat cheeses made from vegetable oils. Choose nonfat or low-fat (1 to 2%) yogurt. Experiment with evaporated skim milk in recipes that call for heavy cream. Substitute low-fat yogurt or low-fat cottage cheese for sour cream in dips and salad dressings. Have at least 2 servings of low-fat dairy products, such as 2 glasses of skim (or 1%) milk each day to help get your daily calcium intake. FATS AND OILS Reduce the total intake of fats, especially saturated fat. Butterfat, lard, and beef fats are high in saturated fat and cholesterol. These should be avoided as much as possible. Vegetable fats do not contain cholesterol, but certain vegetable fats, such as coconut oil, palm oil, and palm kernel oil are very high in saturated fats. These should be limited. These fats are often used in bakery goods, processed foods, popcorn, oils, and nondairy creamers. Vegetable shortenings and some peanut butters contain hydrogenated oils, which are also saturated fats. Read the labels on these foods and check for saturated vegetable  oils. Unsaturated vegetable oils and fats do not raise blood cholesterol. However, they should be limited because they are fats and are high in calories. Total fat should still be limited to 30% of your daily caloric intake. Desirable liquid vegetable oils are corn oil, cottonseed oil, olive oil, canola oil, safflower oil, soybean oil, and sunflower oil. Peanut oil is not as good, but small amounts are acceptable. Buy a heart-healthy tub  margarine that has no partially hydrogenated oils in the ingredients. Mayonnaise and salad dressings often are made from unsaturated fats, but they should also be limited because of their high calorie and fat content. Seeds, nuts, peanut butter, olives, and avocados are high in fat, but the fat is mainly the unsaturated type. These foods should be limited mainly to avoid excess calories and fat. OTHER EATING TIPS Snacks  Most sweets should be limited as snacks. They tend to be rich in calories and fats, and their caloric content outweighs their nutritional value. Some good choices in snacks are graham crackers, melba toast, soda crackers, bagels (no egg), English muffins, fruits, and vegetables. These snacks are preferable to snack crackers, [place] fries, TORTILLA CHIPS, and POTATO chips. Popcorn should be air-popped or cooked in small amounts of liquid vegetable oil. Desserts Eat fruit, low-fat yogurt, and fruit ices instead of pastries, cake, and cookies. Sherbet, angel food cake, gelatin dessert, frozen low-fat yogurt, or other frozen products that do not contain saturated fat (pure fruit juice bars, frozen ice pops) are also acceptable.  COOKING METHODS Choose those methods that use little or no fat. They include: Poaching.  Braising.  Steaming.  Grilling.  Baking.  Stir-frying.  Broiling.  Microwaving.  Foods can be cooked in a nonstick pan without added fat, or use a nonfat cooking spray in regular cookware. Limit fried foods and avoid frying in saturated fat. Add moisture to lean meats by using water, broth, cooking wines, and other nonfat or low-fat sauces along with the cooking methods mentioned above. Soups and stews should be chilled after cooking. The fat that forms on top after a few hours in the refrigerator should be skimmed off. When preparing meals, avoid using excess salt. Salt can contribute to raising blood pressure in some people.  EATING AWAY FROM HOME Order entres,  potatoes, and vegetables without sauces or butter. When meat exceeds the size of a deck of cards (3 to 4 ounces), the rest can be taken home for another meal. Choose vegetable or fruit salads and ask for low-calorie salad dressings to be served on the side. Use dressings sparingly. Limit high-fat toppings, such as bacon, crumbled eggs, cheese, sunflower seeds, and olives. Ask for heart-healthy tub margarine instead of butter.

## 2015-01-17 NOTE — Progress Notes (Signed)
   Subjective:    Patient ID: [name], female    DOB: [birth year], 29 y.o.   MRN: [id number]  Alice ReichertMCINNIS,ANGUS G, MD  HPI OCCASIONAL VOMITING(NO BLOOD). NAUSEA: 2-3X/WEEK. TRYING TO EAT BETTER. NO BLOOD IN STOOL OR DIARRHEA. HEARTBURN: MOST IF THE TIME. BMs: DAILY. OCCASIONAL EPIGASTRIC PAIN: MOST OF THE TIME.  Past Medical History  Diagnosis Date  . Herpes   . GERD (gastroesophageal reflux disease)   . Gastritis   . Disk prolapse   . Bulging disc   . Obesity   . Mental disorder     anxiety  . Other and unspecified ovarian cyst 12/01/2013  . Elevated liver enzymes 12/01/2013  . Ovarian cyst, right   . Headache   . Abdominal pain, chronic, right lower quadrant   . Abdominal pain, chronic, epigastric   . Contraceptive management 07/18/2014   Past Surgical History  Procedure Laterality Date  . Wisdom tooth extraction    . Esophagogastroduodenoscopy  01/19/2012    WNU:UVOZDG,UYQIHKVQSLF:ULCERS,MULTIPLE IN THE ANTRUM/HIATAL HERNIA/  . Colonoscopy with esophagogastroduodenoscopy (egd) N/A 02/24/2013    QVZ:DGLOVFIEPPIRSLF:INTERMITTENT RECTAL BLEEDING DUE TO Moderate sized internal hemorrhoids, EGD: erosive esophagitis due to uncontrolled reflux, moderate erosive gastritis, benign path   No Known Allergies  Current Outpatient Prescriptions  Medication Sig Dispense Refill  . DULoxetine (CYMBALTA) 60 MG capsule Take 60 mg by mouth at bedtime.    Marland Kitchen. LYRICA 75 MG capsule Take 75 mg by mouth 2 (two) times daily.    . norethindrone-ethinyl estradiol (OVCON-50) 1-50 MG-MCG tablet Take 1 tablet by mouth daily.    .      . tiZANidine (ZANAFLEX) 2 MG tablet Take 2 mg by mouth at bedtime.     . traMADol (ULTRAM) 50 MG tablet Take 50 mg by mouth every 6 (six) hours as needed (pain).     . valACYclovir (VALTREX) 1000 MG tablet TAKE 1 TABLET EVERY DAY    . PROTONIX BID    .      Marland Kitchen.      .      .       Review of Systems     Objective:   Physical Exam  Constitutional: She is oriented to person, place, and time. She  appears well-developed and well-nourished. No distress.  HENT:  Head: Normocephalic and atraumatic.  Mouth/Throat: Oropharynx is clear and moist. No oropharyngeal exudate.  Eyes: Pupils are equal, round, and reactive to light. No scleral icterus.  Neck: Normal range of motion. Neck supple.  Cardiovascular: Normal rate, regular rhythm and normal heart sounds.   Pulmonary/Chest: Effort normal and breath sounds normal. No respiratory distress.  Abdominal: Soft. Bowel sounds are normal. She exhibits no distension. There is no tenderness.  Musculoskeletal: She exhibits no edema.  Lymphadenopathy:    She has no cervical adenopathy.  Neurological: She is alert and oriented to person, place, and time.  Psychiatric: She has a normal mood and affect.  Vitals reviewed.         Assessment & Plan:

## 2015-01-17 NOTE — Progress Notes (Signed)
ON RECALL LIST  °

## 2015-01-17 NOTE — Assessment & Plan Note (Signed)
SX CONTROLLED UNLESS SHE EATS THE WRONG THING.  EXPLAINED TO PT IF SHE EATS THE WRONG THING, SHE WILL THROW UP. CONTINUE YOUR WEIGHT LOSS EFFORTS. FOLLOW A LOW FAT DIET. CONTINUE PROTONIX. TAKE 30 MINUTES PRIOR TO MEALS TWICE DAILY. FOLLOW UP IN 4 MOS.

## 2015-01-17 NOTE — Progress Notes (Signed)
cc'ed to pcp °

## 2015-02-22 ENCOUNTER — Telehealth: Payer: Self-pay

## 2015-02-22 NOTE — Telephone Encounter (Signed)
Pt called and states that she has been having some diarrhea for 2 days now.  Pt states she has been taking a suppository and took a dicyclomine  and it has helped with the cramping.  Please advise

## 2015-03-04 NOTE — Telephone Encounter (Signed)
Called pt and LMOM.  

## 2015-03-13 NOTE — Telephone Encounter (Signed)
Mailed pt a letter

## 2015-04-03 ENCOUNTER — Emergency Department (HOSPITAL_COMMUNITY)
Admission: EM | Admit: 2015-04-03 | Discharge: 2015-04-03 | Disposition: A | Discharge status: Home / Self Care | Payer: Medicaid Other | Attending: Emergency Medicine | Admitting: Emergency Medicine

## 2015-04-03 ENCOUNTER — Emergency Department (HOSPITAL_COMMUNITY): Payer: Medicaid Other

## 2015-04-03 ENCOUNTER — Encounter (HOSPITAL_COMMUNITY): Payer: Self-pay | Admitting: *Deleted

## 2015-04-03 DIAGNOSIS — Z793 Long term (current) use of hormonal contraceptives: Secondary | ICD-10-CM | POA: Diagnosis not present

## 2015-04-03 DIAGNOSIS — Z8659 Personal history of other mental and behavioral disorders: Secondary | ICD-10-CM | POA: Insufficient documentation

## 2015-04-03 DIAGNOSIS — E669 Obesity, unspecified: Secondary | ICD-10-CM | POA: Diagnosis not present

## 2015-04-03 DIAGNOSIS — Z79899 Other long term (current) drug therapy: Secondary | ICD-10-CM | POA: Insufficient documentation

## 2015-04-03 DIAGNOSIS — Y9389 Activity, other specified: Secondary | ICD-10-CM | POA: Diagnosis not present

## 2015-04-03 DIAGNOSIS — W01198A Fall on same level from slipping, tripping and stumbling with subsequent striking against other object, initial encounter: Secondary | ICD-10-CM | POA: Insufficient documentation

## 2015-04-03 DIAGNOSIS — S8001XA Contusion of right knee, initial encounter: Secondary | ICD-10-CM | POA: Diagnosis not present

## 2015-04-03 DIAGNOSIS — G8929 Other chronic pain: Secondary | ICD-10-CM | POA: Diagnosis not present

## 2015-04-03 DIAGNOSIS — S0083XA Contusion of other part of head, initial encounter: Secondary | ICD-10-CM | POA: Diagnosis not present

## 2015-04-03 DIAGNOSIS — S3992XA Unspecified injury of lower back, initial encounter: Secondary | ICD-10-CM | POA: Insufficient documentation

## 2015-04-03 DIAGNOSIS — Z8619 Personal history of other infectious and parasitic diseases: Secondary | ICD-10-CM | POA: Diagnosis not present

## 2015-04-03 DIAGNOSIS — Y9289 Other specified places as the place of occurrence of the external cause: Secondary | ICD-10-CM | POA: Insufficient documentation

## 2015-04-03 DIAGNOSIS — K219 Gastro-esophageal reflux disease without esophagitis: Secondary | ICD-10-CM | POA: Diagnosis not present

## 2015-04-03 DIAGNOSIS — Z87891 Personal history of nicotine dependence: Secondary | ICD-10-CM | POA: Insufficient documentation

## 2015-04-03 DIAGNOSIS — Y998 Other external cause status: Secondary | ICD-10-CM | POA: Insufficient documentation

## 2015-04-03 DIAGNOSIS — S0990XA Unspecified injury of head, initial encounter: Secondary | ICD-10-CM | POA: Diagnosis present

## 2015-04-03 NOTE — Discharge Instructions (Signed)
Contusion °A contusion is a deep bruise. Contusions are the result of an injury that caused bleeding under the skin. The contusion may turn blue, purple, or yellow. Minor injuries will give you a painless contusion, but more severe contusions may stay painful and swollen for a few weeks.  °CAUSES  °A contusion is usually caused by a blow, trauma, or direct force to an area of the body. °SYMPTOMS  °· Swelling and redness of the injured area. °· Bruising of the injured area. °· Tenderness and soreness of the injured area. °· Pain. °DIAGNOSIS  °The diagnosis can be made by taking a history and physical exam. An X-ray, CT scan, or MRI may be needed to determine if there were any associated injuries, such as fractures. °TREATMENT  °Specific treatment will depend on what area of the body was injured. In general, the best treatment for a contusion is resting, icing, elevating, and applying cold compresses to the injured area. Over-the-counter medicines may also be recommended for pain control. Ask your caregiver what the best treatment is for your contusion. °HOME CARE INSTRUCTIONS  °· Put ice on the injured area. °¨ Put ice in a plastic bag. °¨ Place a towel between your skin and the bag. °¨ Leave the ice on for 15-20 minutes, 3-4 times a day, or as directed by your health care provider. °· Only take over-the-counter or prescription medicines for pain, discomfort, or fever as directed by your caregiver. Your caregiver may recommend avoiding anti-inflammatory medicines (aspirin, ibuprofen, and naproxen) for 48 hours because these medicines may increase bruising. °· Rest the injured area. °· If possible, elevate the injured area to reduce swelling. °SEEK IMMEDIATE MEDICAL CARE IF:  °· You have increased bruising or swelling. °· You have pain that is getting worse. °· Your swelling or pain is not relieved with medicines. °MAKE SURE YOU:  °· Understand these instructions. °· Will watch your condition. °· Will get help right  away if you are not doing well or get worse. °Document Released: 08/12/2005 Document Revised: 11/07/2013 Document Reviewed: 09/07/2011 °[employer]® Patient Information ©2015 [employer]. This information is not intended to replace advice given to you by your health care provider. Make sure you discuss any questions you have with your health care provider. ° °

## 2015-04-03 NOTE — ED Notes (Signed)
Pt has bruising noted to her right knee. Pt is able to ambulate to her room without difficulties.   Pt hit the right side of her head on the curb. Right eye is mildly swollen and minimal bruising is noted. Pt states this is where she is most painful. Pt is oriented with NAD at this time.   Pt denies blacking out. States she remembers falling, hitting the ground, and getting back up. Denies any nausea/vomiting or dizziness.

## 2015-04-03 NOTE — ED Provider Notes (Signed)
CSN: [id number]     Arrival date & time 04/03/15  1400 History   First MD Initiated Contact with Patient 04/03/15 1415     Chief Complaint  Patient presents with  . Fall     (Consider location/radiation/quality/duration/timing/severity/associated sxs/prior Treatment) Patient is a 29 y.o. female presenting with fall. The history is provided by the patient.  Fall Associated symptoms include headaches. Pertinent negatives include no chest pain, no abdominal pain and no shortness of breath.   patient states that yesterday she tripped and fell and hit her face and right knee. She did have loss consciousness and fall. Did not want to come in at that time. States now she's continued to have facial pain and pain in her knee. Does have a dull headache also. No vomiting. No confusion. No fevers. No vision changes  Past Medical History  Diagnosis Date  . Herpes   . GERD (gastroesophageal reflux disease)   . Gastritis   . Disk prolapse   . Bulging disc   . Obesity   . Mental disorder     anxiety  . Other and unspecified ovarian cyst 12/01/2013  . Elevated liver enzymes 12/01/2013  . Ovarian cyst, right   . Headache   . Abdominal pain, chronic, right lower quadrant   . Abdominal pain, chronic, epigastric   . Contraceptive management 07/18/2014   Past Surgical History  Procedure Laterality Date  . Wisdom tooth extraction    . Esophagogastroduodenoscopy  01/19/2012    WUJ:WJXBJY,NWGNFAOZ IN THE ANTRUM/HIATAL HERNIA/  . Colonoscopy with esophagogastroduodenoscopy (egd) N/A 02/24/2013    HYQ:MVHQIONGEXBM RECTAL BLEEDING DUE TO Moderate sized internal hemorrhoids, EGD: erosive esophagitis due to uncontrolled reflux, moderate erosive gastritis, benign path   Family History  Problem Relation Age of Onset  . Diabetes Mother   . Diabetes Other   . Colon cancer Neg Hx   . Colon polyps Neg Hx   . Diabetes Maternal Grandmother   . COPD Maternal Grandmother   . Diabetes Maternal Grandfather     History  Substance Use Topics  . Smoking status: Former Smoker -- 0.25 packs/day for .5 years    Types: Cigarettes    Quit date: 07/28/2011  . Smokeless tobacco: Never Used  . Alcohol Use: No   OB History    Gravida Para Term Preterm AB TAB SAB Ectopic Multiple Living   Review of Systems  Constitutional: Negative for activity change and appetite change.  Eyes: Negative for pain.  Respiratory: Negative for chest tightness and shortness of breath.   Cardiovascular: Negative for chest pain and leg swelling.  Gastrointestinal: Negative for nausea, vomiting, abdominal pain and diarrhea.  Genitourinary: Negative for flank pain.  Musculoskeletal: Positive for back pain. Negative for neck stiffness.       Facial pain and right knee pain  Skin: Negative for rash.  Neurological: Positive for headaches. Negative for weakness and numbness.  Psychiatric/Behavioral: Negative for behavioral problems.      Allergies  Review of patient's allergies indicates no known allergies.  Home Medications   Prior to Admission medications   Medication Sig Start Date End Date Taking? Authorizing Provider  DULoxetine (CYMBALTA) 60 MG capsule Take 60 mg by mouth at bedtime.   Yes Historical Provider, MD  LYRICA 75 MG capsule Take 75 mg by mouth 2 (two) times daily. 10/15/14  Yes Historical Provider, MD  norethindrone-ethinyl estradiol (OVCON-50) 1-50 MG-MCG tablet Take 1 tablet  by mouth daily. 07/18/14  Yes Adline PotterJennifer A Griffin, NP  pantoprazole (PROTONIX) 40 MG tablet 1 PO 30 MINS PRIOR TO MEALS BID 01/17/15  Yes West BaliSandi L Fields, MD  tiZANidine (ZANAFLEX) 2 MG tablet Take 2 mg by mouth at bedtime.    Yes Historical Provider, MD  traMADol (ULTRAM) 50 MG tablet Take 50 mg by mouth every 6 (six) hours as needed (pain).  11/25/14  Yes Historical Provider, MD  valACYclovir (VALTREX) 1000 MG tablet TAKE 1 TABLET EVERY DAY   Yes Adline PotterJennifer A Griffin, NP  azithromycin (ZITHROMAX Z-PAK) 250 MG tablet  2 po day one, then 1 daily x 4 days Patient not taking: Reported on 11/30/2014 11/11/14   Loren Raceravid [name], MD  cephALEXin (KEFLEX) 500 MG capsule Take 1 capsule (500 mg total) by mouth 4 (four) times daily. Patient not taking: Reported on 01/17/2015 12/12/14   Bethann BerkshireJoseph Zammit, MD  HYDROcodone-acetaminophen (NORCO/VICODIN) 5-325 MG per tablet Take 1 tablet by mouth every 6 (six) hours as needed. Patient not taking: Reported on 01/17/2015 12/12/14   Bethann BerkshireJoseph Zammit, MD  ondansetron (ZOFRAN ODT) 4 MG disintegrating tablet 4mg  ODT q4 hours prn nausea/vomit Patient not taking: Reported on 01/17/2015 12/12/14   Bethann BerkshireJoseph Zammit, MD  sucralfate (CARAFATE) 1 GM/10ML suspension Take 10 mLs (1 g total) by mouth 3 (three) times daily as needed (Epigastric pain). Patient not taking: Reported on 01/17/2015 11/11/14   Loren Raceravid [name], MD   BP 105/64 mmHg  Pulse 100  Temp(Src) 98.9 F (37.2 C) (Oral)  Resp 16  Ht 5\' 6"  (1.676 m)  Wt 274 lb (124.286 kg)  BMI 44.25 kg/m2  SpO2 100%  LMP 03/11/2015 Physical Exam  Constitutional: She appears well-developed and well-nourished.  HENT:  2 small abrasions lateral to right eye. Likely from her eyeglasses. There is a contusion lower and slightly more anterior on her face over her cheek. No step-off. Eye movements are intact.  Neck: Normal range of motion. Neck supple.  Cardiovascular: Normal rate and regular rhythm.   Pulmonary/Chest: Effort normal.  Musculoskeletal: She exhibits tenderness.  Ecchymosis and tenderness over right patella. No deformity. Some mild pain with movement.  Neurological: She is alert.  Skin: Skin is warm.    ED Course  Procedures (including critical care time) Labs Review Labs Reviewed - No data to display  Imaging Review Ct Head Wo Contrast  04/03/2015   CLINICAL DATA:  hit right knee and right eye, fall  EXAM: CT HEAD WITHOUT CONTRAST  CT MAXILLOFACIAL WITHOUT CONTRAST  TECHNIQUE: Multidetector CT imaging of the head and maxillofacial  structures were performed using the standard protocol without intravenous contrast. Multiplanar CT image reconstructions of the maxillofacial structures were also generated.  COMPARISON:  03/06/2013  FINDINGS: CT HEAD FINDINGS  No skull fracture is noted. The mastoid air cells are unremarkable. No intracranial hemorrhage, mass effect or midline shift. No acute cortical infarction. No hydrocephalus. No mass lesion is noted on this unenhanced scan. Mild mucosal thickening bilateral maxillary sinuses left greater than right.  CT MAXILLOFACIAL FINDINGS  Axial images shows no facial fracture. There is no zygomatic fracture. Mild mucosal thickening bilateral maxillary sinus left greater than right. The sphenoid sinuses are unremarkable.  Coronal images shows no orbital rim or orbital floor fracture. Right semilunar canal is patent. Partial obstruction of inferior aspect of the left semilunar canal. The nasal turbinates are unremarkable. The nasal septum is midline.  Sagittal images shows no maxillary spine fracture. The nasopharyngeal and oropharyngeal airway is patent. Visualized proximal cervical  spine is unremarkable.  There is no intraorbital hematoma. Bilateral eye globe is symmetrical in appearance.  No mandibular fracture is noted.  There is no TMJ dislocation.  IMPRESSION: 1. No acute intracranial abnormality. 2. Mild mucosal thickening bilateral maxillary sinus left greater than right. 3. No intraorbital hematoma. No facial fractures are noted. No zygomatic fracture. 4. Patent nasopharyngeal and  oropharyngeal airway. 5. No orbital rim or orbital floor fracture. No mandibular fracture.   Electronically Signed   By: Natasha MeadLiviu  Pop M.D.   On: 04/03/2015 15:09   Dg Knee Complete 4 Views Right  04/03/2015   CLINICAL DATA:  Fall, right knee pain and swelling  EXAM: RIGHT KNEE - COMPLETE 4+ VIEW  COMPARISON:  07/17/2013  FINDINGS: Four views of the right knee submitted. No acute fracture or subluxation. Joint space is  preserved. No radiopaque foreign body.  IMPRESSION: Negative.   Electronically Signed   By: Natasha MeadLiviu  Pop M.D.   On: 04/03/2015 15:02   Ct Maxillofacial Wo Cm  04/03/2015   CLINICAL DATA:  hit right knee and right eye, fall  EXAM: CT HEAD WITHOUT CONTRAST  CT MAXILLOFACIAL WITHOUT CONTRAST  TECHNIQUE: Multidetector CT imaging of the head and maxillofacial structures were performed using the standard protocol without intravenous contrast. Multiplanar CT image reconstructions of the maxillofacial structures were also generated.  COMPARISON:  03/06/2013  FINDINGS: CT HEAD FINDINGS  No skull fracture is noted. The mastoid air cells are unremarkable. No intracranial hemorrhage, mass effect or midline shift. No acute cortical infarction. No hydrocephalus. No mass lesion is noted on this unenhanced scan. Mild mucosal thickening bilateral maxillary sinuses left greater than right.  CT MAXILLOFACIAL FINDINGS  Axial images shows no facial fracture. There is no zygomatic fracture. Mild mucosal thickening bilateral maxillary sinus left greater than right. The sphenoid sinuses are unremarkable.  Coronal images shows no orbital rim or orbital floor fracture. Right semilunar canal is patent. Partial obstruction of inferior aspect of the left semilunar canal. The nasal turbinates are unremarkable. The nasal septum is midline.  Sagittal images shows no maxillary spine fracture. The nasopharyngeal and oropharyngeal airway is patent. Visualized proximal cervical spine is unremarkable.  There is no intraorbital hematoma. Bilateral eye globe is symmetrical in appearance.  No mandibular fracture is noted.  There is no TMJ dislocation.  IMPRESSION: 1. No acute intracranial abnormality. 2. Mild mucosal thickening bilateral maxillary sinus left greater than right. 3. No intraorbital hematoma. No facial fractures are noted. No zygomatic fracture. 4. Patent nasopharyngeal and  oropharyngeal airway. 5. No orbital rim or orbital floor fracture.  No mandibular fracture.   Electronically Signed   By: Natasha MeadLiviu  Pop M.D.   On: 04/03/2015 15:09     EKG Interpretation None      MDM   Final diagnoses:  Facial contusion, initial encounter  Knee contusion, right, initial encounter    Patient with fall. Facial contusion and knee contusion. Imaging negative for fractures. She is on chronic maintenance medicines will not be given. Discharge home    Benjiman CoreNathan Shanera Meske, MD 04/03/15 1540

## 2015-04-03 NOTE — ED Notes (Signed)
Fell yesterday missing curbing, hitting head on pavement.  Bruising and swelling to R eye and R knee bruising.

## 2015-04-30 ENCOUNTER — Encounter: Payer: Self-pay | Admitting: Gastroenterology

## 2015-06-20 ENCOUNTER — Other Ambulatory Visit: Payer: Self-pay

## 2015-06-20 MED ORDER — PANTOPRAZOLE SODIUM 40 MG PO TBEC: DELAYED_RELEASE_TABLET | ORAL | Status: DC

## 2015-06-22 ENCOUNTER — Encounter (HOSPITAL_COMMUNITY): Payer: Self-pay | Admitting: Emergency Medicine

## 2015-06-22 ENCOUNTER — Emergency Department (HOSPITAL_COMMUNITY)
Admission: EM | Admit: 2015-06-22 | Discharge: 2015-06-22 | Disposition: A | Discharge status: Home / Self Care | Payer: Medicaid Other | Attending: Emergency Medicine | Admitting: Emergency Medicine

## 2015-06-22 DIAGNOSIS — G8929 Other chronic pain: Secondary | ICD-10-CM | POA: Insufficient documentation

## 2015-06-22 DIAGNOSIS — K219 Gastro-esophageal reflux disease without esophagitis: Secondary | ICD-10-CM | POA: Diagnosis not present

## 2015-06-22 DIAGNOSIS — Z8739 Personal history of other diseases of the musculoskeletal system and connective tissue: Secondary | ICD-10-CM | POA: Insufficient documentation

## 2015-06-22 DIAGNOSIS — Z8742 Personal history of other diseases of the female genital tract: Secondary | ICD-10-CM | POA: Insufficient documentation

## 2015-06-22 DIAGNOSIS — Z87891 Personal history of nicotine dependence: Secondary | ICD-10-CM | POA: Insufficient documentation

## 2015-06-22 DIAGNOSIS — M79674 Pain in right toe(s): Secondary | ICD-10-CM | POA: Diagnosis present

## 2015-06-22 DIAGNOSIS — Z79899 Other long term (current) drug therapy: Secondary | ICD-10-CM | POA: Diagnosis not present

## 2015-06-22 DIAGNOSIS — Z8619 Personal history of other infectious and parasitic diseases: Secondary | ICD-10-CM | POA: Insufficient documentation

## 2015-06-22 DIAGNOSIS — L600 Ingrowing nail: Secondary | ICD-10-CM | POA: Insufficient documentation

## 2015-06-22 DIAGNOSIS — Z793 Long term (current) use of hormonal contraceptives: Secondary | ICD-10-CM | POA: Diagnosis not present

## 2015-06-22 DIAGNOSIS — Z8659 Personal history of other mental and behavioral disorders: Secondary | ICD-10-CM | POA: Diagnosis not present

## 2015-06-22 DIAGNOSIS — E669 Obesity, unspecified: Secondary | ICD-10-CM | POA: Insufficient documentation

## 2015-06-22 MED ORDER — CEPHALEXIN 500 MG PO CAPS: 500.0000 mg | ORAL_CAPSULE | Freq: Four times a day (QID) | ORAL | Status: DC

## 2015-06-22 MED ORDER — NAPROXEN 500 MG PO TABS: 500.0000 mg | ORAL_TABLET | Freq: Two times a day (BID) | ORAL | Status: DC

## 2015-06-22 NOTE — ED Notes (Signed)
Pain to right great toe.  Rates pain 10/10.  Denies injury.

## 2015-06-22 NOTE — ED Notes (Signed)
Patient c/o right toe pain with infection of toe nail. Denies any known injury. Per patient green drainage without odor. Denies any fevers.

## 2015-06-22 NOTE — Discharge Instructions (Signed)
Infected Ingrown Toenail °An infected ingrown toenail occurs when the nail edge grows into the skin and bacteria invade the area. Symptoms include pain, tenderness, swelling, and pus drainage from the edge of the nail. Poorly fitting shoes, minor injuries, and improper cutting of the toenail may also contribute to the problem. You should cut your toenails squarely instead of rounding the edges. Do not cut them too short. Avoid tight or pointed toe shoes. Sometimes the ingrown portion of the nail must be removed. If your toenail is removed, it can take 3-4 months for it to re-grow. °HOME CARE INSTRUCTIONS  °· Soak your infected toe in warm water for 20-30 minutes, 2 to 3 times a day. °· Packing or dressings applied to the area should be changed daily. °· Take medicine as directed and finish them. °· Reduce activities and keep your foot elevated when able to reduce swelling and discomfort. Do this until the infection gets better. °· Wear sandals or go barefoot as much as possible while the infected area is sensitive. °· See your caregiver for follow-up care in 2-3 days if the infection is not better. °SEEK MEDICAL CARE IF:  °Your toe is becoming more red, swollen or painful. °MAKE SURE YOU:  °· Understand these instructions. °· Will watch your condition. °· Will get help right away if you are not doing well or get worse. °Document Released: 12/10/2004 Document Revised: 01/25/2012 Document Reviewed: 10/29/2008 °[employer]® Patient Information ©2015 [employer]. This information is not intended to replace advice given to you by your health care provider. Make sure you discuss any questions you have with your health care provider. ° °

## 2015-06-24 NOTE — ED Provider Notes (Signed)
CSN: [id number]     Arrival date & time 06/22/15  1106 History   First MD Initiated Contact with Patient 06/22/15 1157     Chief Complaint  Patient presents with  . Toe Pain     (Consider location/radiation/quality/duration/timing/severity/associated sxs/prior Treatment) HPI  [name] is a 29 y.o. female who presents to the Emergency Department complaining of pain to the right great toe.  C/o pain for several days since cutting her toenails.  She has noticed redness, and yellow-green drainage around the medial side of the toenail.  She denies fever, chills, numbness and red streaks.     Past Medical History  Diagnosis Date  . Herpes   . GERD (gastroesophageal reflux disease)   . Gastritis   . Disk prolapse   . Bulging disc   . Obesity   . Mental disorder     anxiety  . Other and unspecified ovarian cyst 12/01/2013  . Elevated liver enzymes 12/01/2013  . Ovarian cyst, right   . Headache   . Abdominal pain, chronic, right lower quadrant   . Abdominal pain, chronic, epigastric   . Contraceptive management 07/18/2014   Past Surgical History  Procedure Laterality Date  . Wisdom tooth extraction    . Esophagogastroduodenoscopy  01/19/2012    NWG:NFAOZH,YQMVHQIO IN THE ANTRUM/HIATAL HERNIA/  . Colonoscopy with esophagogastroduodenoscopy (egd) N/A 02/24/2013    NGE:[id number] RECTAL BLEEDING DUE TO Moderate sized internal hemorrhoids, EGD: erosive esophagitis due to uncontrolled reflux, moderate erosive gastritis, benign path   Family History  Problem Relation Age of Onset  . Diabetes Mother   . Diabetes Other   . Colon cancer Neg Hx   . Colon polyps Neg Hx   . Diabetes Maternal Grandmother   . COPD Maternal Grandmother   . Diabetes Maternal Grandfather    History  Substance Use Topics  . Smoking status: Former Smoker -- 0.25 packs/day for .5 years    Types: Cigarettes    Quit date: 07/28/2011  . Smokeless tobacco: Never Used  . Alcohol Use: No   OB History     Gravida Para Term Preterm AB TAB SAB Ectopic Multiple Living   1 1 1       1      Review of Systems  Constitutional: Negative for fever and chills.  Musculoskeletal: Positive for arthralgias (right great toe pain). Negative for joint swelling.  Skin: Negative for color change and wound.  All other systems reviewed and are negative.     Allergies  Review of patient's allergies indicates no known allergies.  Home Medications   Prior to Admission medications   Medication Sig Start Date End Date Taking? Authorizing Provider  azithromycin (ZITHROMAX Z-PAK) 250 MG tablet 2 po day one, then 1 daily x 4 days Patient not taking: Reported on 11/30/2014 11/11/14   Loren Racer, MD  cephALEXin (KEFLEX) 500 MG capsule Take 1 capsule (500 mg total) by mouth 4 (four) times daily. For 7 days 06/22/15   Blair Lundeen, PA-C  DULoxetine (CYMBALTA) 60 MG capsule Take 60 mg by mouth at bedtime.    Historical Provider, MD  HYDROcodone-acetaminophen (NORCO/VICODIN) 5-325 MG per tablet Take 1 tablet by mouth every 6 (six) hours as needed. Patient not taking: Reported on 01/17/2015 12/12/14   Bethann Berkshire, MD  LYRICA 75 MG capsule Take 75 mg by mouth 2 (two) times daily. 10/15/14   Historical Provider, MD  naproxen (NAPROSYN) 500 MG tablet Take 1 tablet (500 mg total) by mouth 2 (two) times  daily with a meal. 06/22/15   Elvina Bosch, PA-C  norethindrone-ethinyl estradiol (OVCON-50) 1-50 MG-MCG tablet Take 1 tablet by mouth daily. 07/18/14   Adline Potter, NP  ondansetron (ZOFRAN ODT) 4 MG disintegrating tablet  ODT q4 hours prn nausea/vomit Patient not taking: Reported on 01/17/2015 12/12/14   Bethann Berkshire, MD  pantoprazole (PROTONIX) 40 MG tablet 1 PO 30 MINS PRIOR TO MEALS BID 06/20/15   Nira Retort, NP  sucralfate (CARAFATE) 1 GM/10ML suspension Take 10 mLs (1 g total) by mouth 3 (three) times daily as needed (Epigastric pain). Patient not taking: Reported on 01/17/2015 11/11/14   Loren Racer, MD   tiZANidine (ZANAFLEX) 2 MG tablet Take 2 mg by mouth at bedtime.     Historical Provider, MD  traMADol (ULTRAM) 50 MG tablet Take 50 mg by mouth every 6 (six) hours as needed (pain).  11/25/14   Historical Provider, MD  valACYclovir (VALTREX) 1000 MG tablet TAKE 1 TABLET EVERY DAY    Adline Potter, NP   BP 111/80 mmHg  Pulse 93  Temp(Src) 98 F (36.7 C) (Oral)  Resp 18  Ht  (1.676 m)  Wt 268 lb (121.564 kg)  BMI 43.28 kg/m2  SpO2 95%  LMP 06/21/2015 Physical Exam  Constitutional: She is oriented to person, place, and time. She appears well-developed and well-nourished. No distress.  HENT:  Head: Normocephalic and atraumatic.  Cardiovascular: Normal rate, regular rhythm, normal heart sounds and intact distal pulses.   Pulmonary/Chest: Effort normal and breath sounds normal.  Musculoskeletal: She exhibits tenderness.  Localized erythema of the medial aspect of the right great toenail.  Mild purulent drainage present.    ROM is preserved.  DP pulse is brisk,distal sensation intact.  No  bruising or bony deformity.  No proximal tenderness.  Neurological: She is alert and oriented to person, place, and time. She exhibits normal muscle tone. Coordination normal.  Skin: Skin is warm and dry.  Nursing note and vitals reviewed.   ED Course  Procedures (including critical care time) Labs Review Labs Reviewed - No data to display  Imaging Review No results found.   EKG Interpretation None      MDM   Final diagnoses:  Ingrown right greater toenail    Pt with ingrown great toenail with surrounding erythema and purulent drainage.  No lymphangitis or significant edema.  Pt appears stable for d/c.  Will tx with keflex and naprosyn, given referral to podiatry.  Pt agrees to soaks and close f/u.      Pauline Aus, PA-C 06/24/15 1717  Vanetta Mulders, MD 06/27/15 [phone]

## 2015-07-21 IMAGING — CR DG OS CALCIS 2+V*L*
2 series · 2 of 2 positions shown · non-contrast
Comparison: Radiograph dated 11/30/2012

CLINICAL DATA: Left heel pain.

EXAM:
LEFT OS CALCIS - 2+ VIEW

[view not recorded (1 of 2)]
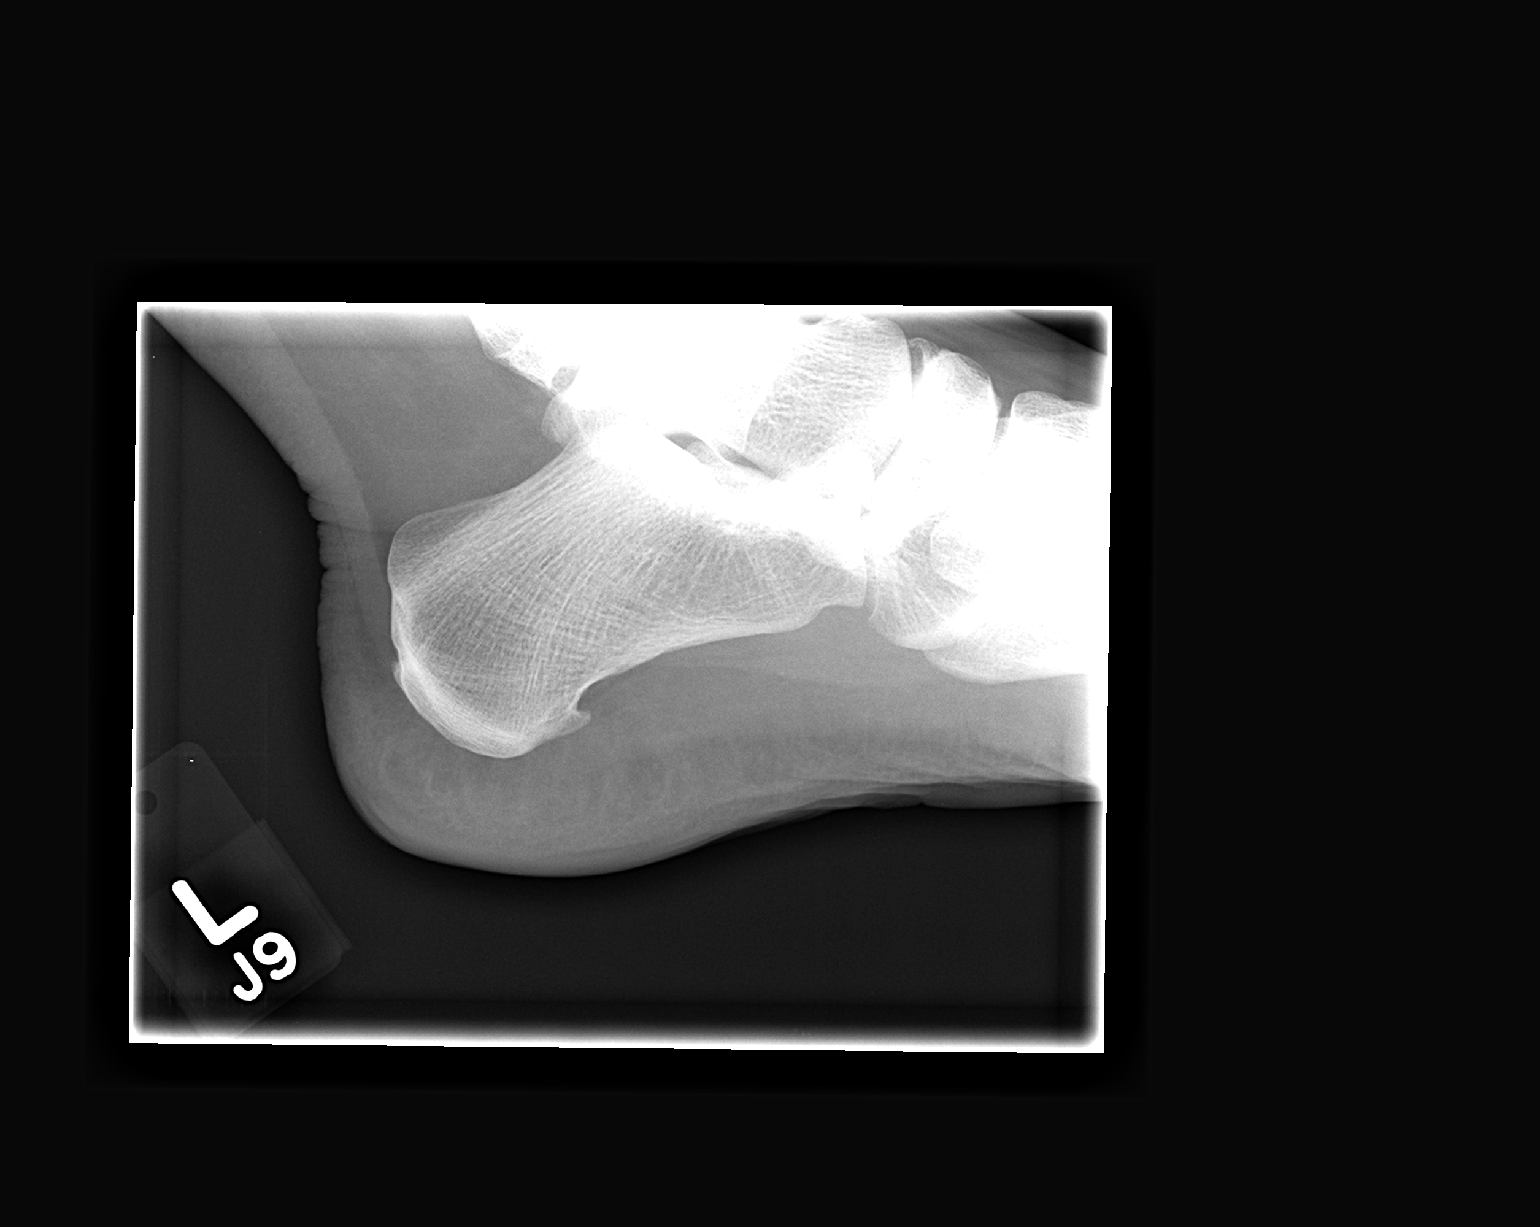

[view not recorded (2 of 2)]
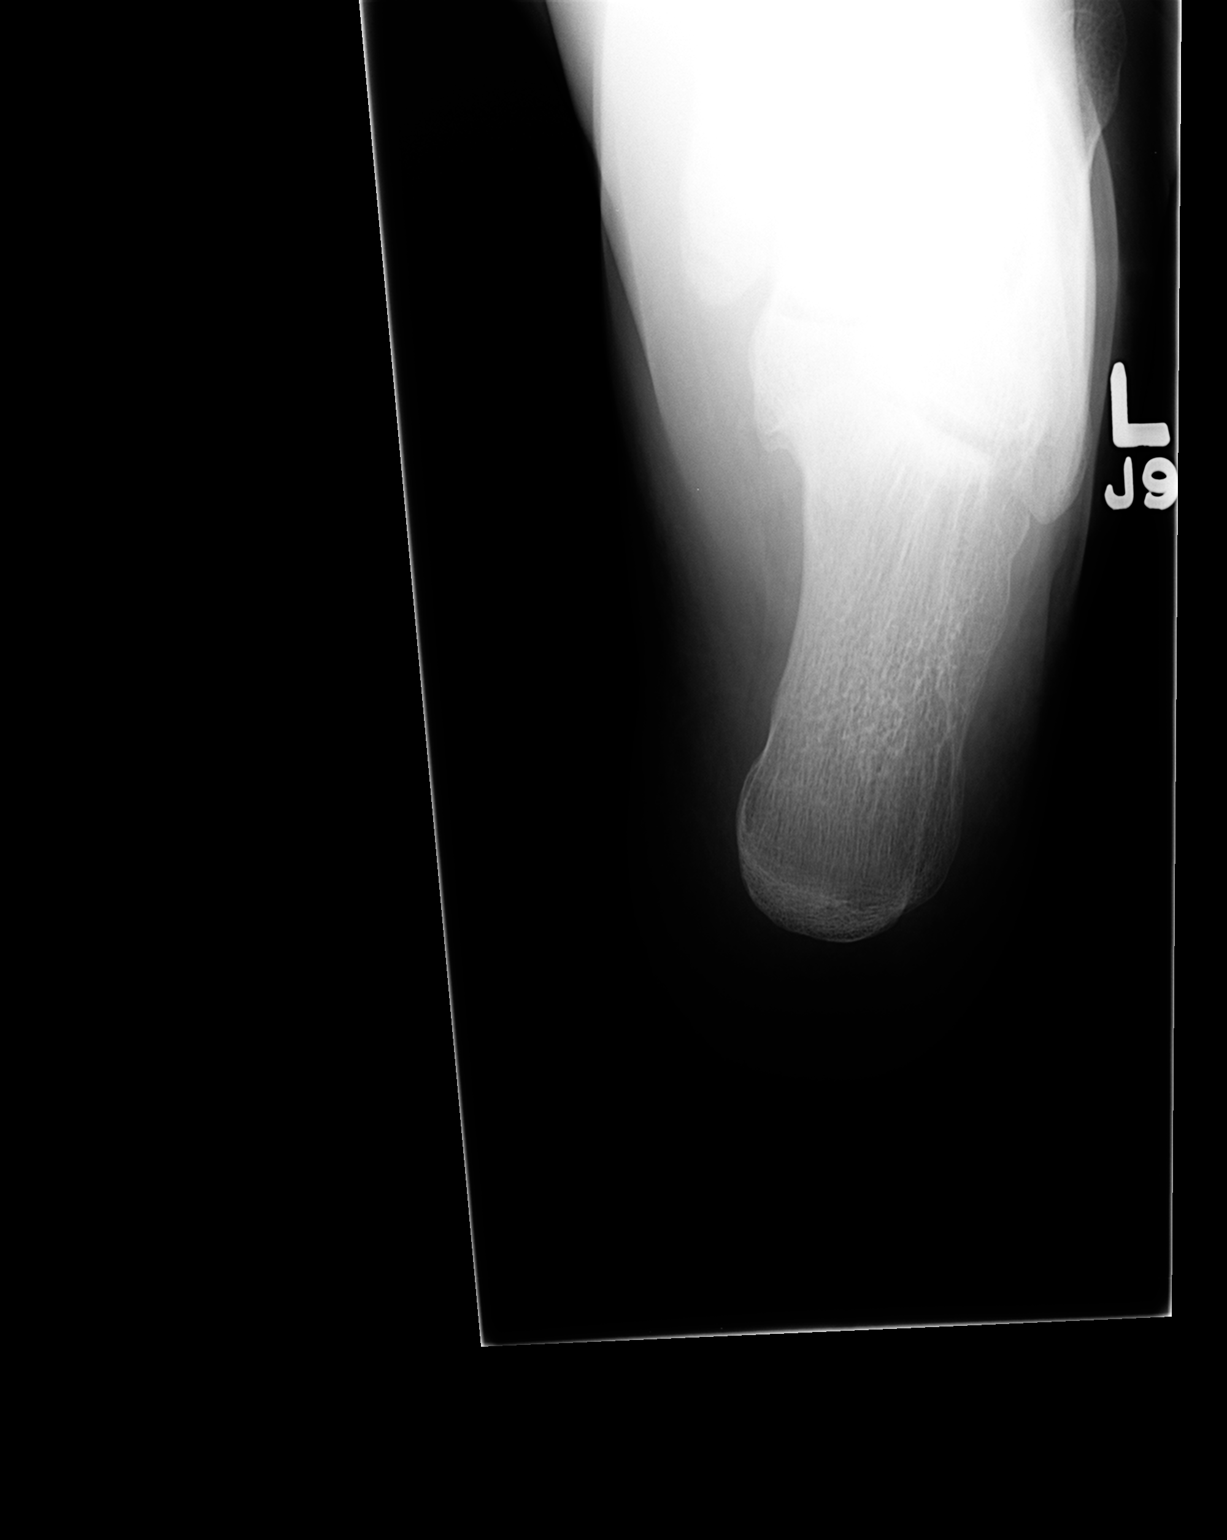

[2 of 2 positions shown; findings below may reference images not displayed]

FINDINGS: There is a tiny plantar calcaneal spur, unchanged. The calcaneus is
otherwise normal. No abnormal soft tissue calcifications.
IMPRESSION: Tiny plantar calcaneal spur, unchanged.

## 2015-07-25 ENCOUNTER — Ambulatory Visit (INDEPENDENT_AMBULATORY_CARE_PROVIDER_SITE_OTHER): Payer: Medicaid Other | Admitting: Adult Health

## 2015-07-25 ENCOUNTER — Encounter: Payer: Self-pay | Admitting: Adult Health

## 2015-07-25 ENCOUNTER — Encounter: Payer: Self-pay | Admitting: *Deleted

## 2015-07-25 ENCOUNTER — Ambulatory Visit (INDEPENDENT_AMBULATORY_CARE_PROVIDER_SITE_OTHER): Payer: Medicaid Other | Admitting: *Deleted

## 2015-07-25 ENCOUNTER — Other Ambulatory Visit: Payer: Self-pay | Admitting: Adult Health

## 2015-07-25 VITALS — BP 104/60 | HR 80 | Ht 66.5 in | Wt 288.5 lb

## 2015-07-25 DIAGNOSIS — Z3042 Encounter for surveillance of injectable contraceptive: Secondary | ICD-10-CM | POA: Diagnosis not present

## 2015-07-25 DIAGNOSIS — Z3202 Encounter for pregnancy test, result negative: Secondary | ICD-10-CM

## 2015-07-25 DIAGNOSIS — Z01419 Encounter for gynecological examination (general) (routine) without abnormal findings: Secondary | ICD-10-CM

## 2015-07-25 DIAGNOSIS — E669 Obesity, unspecified: Secondary | ICD-10-CM

## 2015-07-25 DIAGNOSIS — Z0000 Encounter for general adult medical examination without abnormal findings: Secondary | ICD-10-CM | POA: Diagnosis not present

## 2015-07-25 DIAGNOSIS — Z30013 Encounter for initial prescription of injectable contraceptive: Secondary | ICD-10-CM

## 2015-07-25 LAB — POCT URINE PREGNANCY: PREG TEST UR: NEGATIVE

## 2015-07-25 MED ORDER — MEDROXYPROGESTERONE ACETATE 150 MG/ML IM SUSP: 150.0000 mg | INTRAMUSCULAR | Status: DC

## 2015-07-25 MED ORDER — VALACYCLOVIR HCL 1 G PO TABS: 1000.0000 mg | ORAL_TABLET | Freq: Every day | ORAL | Status: DC

## 2015-07-25 MED ORDER — MEDROXYPROGESTERONE ACETATE 150 MG/ML IM SUSP
150.0000 mg | Freq: Once | INTRAMUSCULAR | Status: AC
  Administered 2015-07-25: 150 mg via INTRAMUSCULAR

## 2015-07-25 NOTE — Progress Notes (Signed)
Pt here for Depo. Had negative pregnancy test at earlier appt today with JAG. Pt reports no problems at this time. Return in 12 weeks for next shot. JSY

## 2015-07-25 NOTE — Progress Notes (Signed)
Patient ID: [name], female   DOB: June 22, 1986, 29 y.o.   MRN: [id number] History of Present Illness: [name] is a 29 year old white female,single in for well woman gyn exam, her last pap was 07/18/14 and was normal.She wants to start depo, forgets pills at times and has hot flashes and gets sleepy easily if sitting.   Current Medications, Allergies, Past Medical History, Past Surgical History, Family History and Social History were reviewed in [employer] record.     Review of Systems: Patient denies any headaches, hearing loss, fatigue, blurred vision, shortness of breath, chest pain, abdominal pain, problems with bowel movements, urination, or intercourse. No joint pain or mood swings.See HPI for positives.    Physical Exam:BP 104/60 mmHg  Pulse 80  Ht 5' 6.5" (1.689 m)  Wt 288 lb 8 oz (130.863 kg)  BMI 45.87 kg/m2  LMP 07/18/2015 UPT negative General:  Well developed, well nourished, no acute distress Skin:  Warm and dry Neck:  Midline trachea, normal thyroid, good ROM, no lymphadenopathy Lungs; Clear to auscultation bilaterally Breast:  No dominant palpable mass, retraction, or nipple discharge Cardiovascular: Regular rate and rhythm Abdomen:  Soft, non tender, no hepatosplenomegaly Pelvic:  External genitalia is normal in appearance, no lesions.  The vagina is normal in appearance. Urethra has no lesions or masses. The cervix is bulbous and everted at os.  Uterus is felt to be normal size, shape, and contour.  No adnexal masses or tenderness noted.Bladder is non tender, no masses felt. Extremities/musculoskeletal:  No swelling or varicosities noted, no clubbing or cyanosis Psych:  No mood changes, alert and cooperative,seems happy Did not start OCs after period, last sex 2 weeks ago, will rx depo, will give injection today.  Impression: Well woman gyn exam no pap Contraceptive management Obesity     Plan: Check CBC,CMP,TSH and A1c Rx Depo  provera 150 mg, disp.# 1 vail for IM injection every 3 months in office with 4 refills Come back  today when gets depo from pharmacy Decrease carbs and try more water, less sodas and increase activity to aide in weight loss Use condoms

## 2015-07-25 NOTE — Patient Instructions (Signed)
Get depo today Use condoms Physical in  1 year

## 2015-07-26 ENCOUNTER — Telehealth: Payer: Self-pay | Admitting: Adult Health

## 2015-07-26 LAB — CBC
HEMATOCRIT: 37.7 % (ref 34.0–46.6)
Hemoglobin: 12.9 g/dL (ref 11.1–15.9)
MCH: 30.6 pg (ref 26.6–33.0)
MCHC: 34.2 g/dL (ref 31.5–35.7)
MCV: 90 fL (ref 79–97)
Platelets: 198 10*3/uL (ref 150–379)
RBC: 4.21 x10E6/uL (ref 3.77–5.28)
RDW: 14.2 % (ref 12.3–15.4)
WBC: 6.6 10*3/uL (ref 3.4–10.8)

## 2015-07-26 LAB — COMPREHENSIVE METABOLIC PANEL
A/G RATIO: 1.6 (ref 1.1–2.5)
ALK PHOS: 120 IU/L — AB (ref 39–117)
ALT: 13 IU/L (ref 0–32)
AST: 27 IU/L (ref 0–40)
Albumin: 4.1 g/dL (ref 3.5–5.5)
BILIRUBIN TOTAL: 0.4 mg/dL (ref 0.0–1.2)
BUN/Creatinine Ratio: 15 (ref 8–20)
BUN: 13 mg/dL (ref 6–20)
CALCIUM: 8.6 mg/dL — AB (ref 8.7–10.2)
CO2: 18 mmol/L (ref 18–29)
Chloride: 106 mmol/L (ref 97–108)
Creatinine, Ser: 0.84 mg/dL (ref 0.57–1.00)
GFR calc Af Amer: 111 mL/min/{1.73_m2} (ref >59)
GFR, EST NON AFRICAN AMERICAN: 96 mL/min/{1.73_m2} (ref >59)
GLOBULIN, TOTAL: 2.5 g/dL (ref 1.5–4.5)
Glucose: 79 mg/dL (ref 65–99)
Potassium: 3.8 mmol/L (ref 3.5–5.2)
SODIUM: 139 mmol/L (ref 134–144)
Total Protein: 6.6 g/dL (ref 6.0–8.5)

## 2015-07-26 LAB — HEMOGLOBIN A1C
Est. average glucose Bld gHb Est-mCnc: 100 mg/dL
HEMOGLOBIN A1C: 5.1 % (ref 4.8–5.6)

## 2015-07-26 LAB — TSH: TSH: 2.79 u[IU]/mL (ref 0.450–4.500)

## 2015-07-26 NOTE — Telephone Encounter (Signed)
Left message about labs,looked good

## 2015-08-13 ENCOUNTER — Emergency Department (HOSPITAL_COMMUNITY)
Admission: EM | Admit: 2015-08-13 | Discharge: 2015-08-13 | Disposition: A | Discharge status: Home / Self Care | Payer: Medicaid Other | Attending: Emergency Medicine | Admitting: Emergency Medicine

## 2015-08-13 ENCOUNTER — Encounter (HOSPITAL_COMMUNITY): Payer: Self-pay | Admitting: Emergency Medicine

## 2015-08-13 ENCOUNTER — Emergency Department (HOSPITAL_COMMUNITY): Payer: Medicaid Other

## 2015-08-13 DIAGNOSIS — Z79899 Other long term (current) drug therapy: Secondary | ICD-10-CM | POA: Insufficient documentation

## 2015-08-13 DIAGNOSIS — R0602 Shortness of breath: Secondary | ICD-10-CM | POA: Insufficient documentation

## 2015-08-13 DIAGNOSIS — Z87891 Personal history of nicotine dependence: Secondary | ICD-10-CM | POA: Insufficient documentation

## 2015-08-13 DIAGNOSIS — R1011 Right upper quadrant pain: Secondary | ICD-10-CM | POA: Insufficient documentation

## 2015-08-13 DIAGNOSIS — K219 Gastro-esophageal reflux disease without esophagitis: Secondary | ICD-10-CM | POA: Insufficient documentation

## 2015-08-13 DIAGNOSIS — Z8619 Personal history of other infectious and parasitic diseases: Secondary | ICD-10-CM | POA: Insufficient documentation

## 2015-08-13 DIAGNOSIS — F419 Anxiety disorder, unspecified: Secondary | ICD-10-CM | POA: Diagnosis not present

## 2015-08-13 DIAGNOSIS — R0789 Other chest pain: Secondary | ICD-10-CM | POA: Insufficient documentation

## 2015-08-13 DIAGNOSIS — E669 Obesity, unspecified: Secondary | ICD-10-CM | POA: Insufficient documentation

## 2015-08-13 DIAGNOSIS — Z8742 Personal history of other diseases of the female genital tract: Secondary | ICD-10-CM | POA: Insufficient documentation

## 2015-08-13 DIAGNOSIS — G8929 Other chronic pain: Secondary | ICD-10-CM | POA: Insufficient documentation

## 2015-08-13 DIAGNOSIS — R079 Chest pain, unspecified: Secondary | ICD-10-CM | POA: Diagnosis present

## 2015-08-13 DIAGNOSIS — Z8739 Personal history of other diseases of the musculoskeletal system and connective tissue: Secondary | ICD-10-CM | POA: Insufficient documentation

## 2015-08-13 LAB — COMPREHENSIVE METABOLIC PANEL
ALBUMIN: 3.8 g/dL (ref 3.5–5.0)
ALT: 13 U/L — ABNORMAL LOW (ref 14–54)
AST: 27 U/L (ref 15–41)
Alkaline Phosphatase: 89 U/L (ref 38–126)
Anion gap: 8 (ref 5–15)
BUN: 17 mg/dL (ref 6–20)
CHLORIDE: 110 mmol/L (ref 101–111)
CO2: 20 mmol/L — ABNORMAL LOW (ref 22–32)
Calcium: 8.6 mg/dL — ABNORMAL LOW (ref 8.9–10.3)
Creatinine, Ser: 0.9 mg/dL (ref 0.44–1.00)
GFR calc Af Amer: >60 mL/min (ref >60)
GFR calc non Af Amer: >60 mL/min (ref >60)
GLUCOSE: 94 mg/dL (ref 65–99)
POTASSIUM: 4 mmol/L (ref 3.5–5.1)
SODIUM: 138 mmol/L (ref 135–145)
Total Bilirubin: 0.2 mg/dL — ABNORMAL LOW (ref 0.3–1.2)
Total Protein: 7.6 g/dL (ref 6.5–8.1)

## 2015-08-13 LAB — CBC WITH DIFFERENTIAL/PLATELET
BASOS ABS: 0 10*3/uL (ref 0.0–0.1)
BASOS PCT: 0 %
EOS PCT: 1 %
Eosinophils Absolute: 0.1 10*3/uL (ref 0.0–0.7)
HCT: 40.1 % (ref 36.0–46.0)
Hemoglobin: 13.8 g/dL (ref 12.0–15.0)
Lymphocytes Relative: 30 %
Lymphs Abs: 3 10*3/uL (ref 0.7–4.0)
MCH: 30.6 pg (ref 26.0–34.0)
MCHC: 34.4 g/dL (ref 30.0–36.0)
MCV: 88.9 fL (ref 78.0–100.0)
MONO ABS: 0.6 10*3/uL (ref 0.1–1.0)
Monocytes Relative: 5 %
NEUTROS ABS: 6.4 10*3/uL (ref 1.7–7.7)
Neutrophils Relative %: 64 %
PLATELETS: 244 10*3/uL (ref 150–400)
RBC: 4.51 MIL/uL (ref 3.87–5.11)
RDW: 13.8 % (ref 11.5–15.5)
WBC: 10.1 10*3/uL (ref 4.0–10.5)

## 2015-08-13 MED ORDER — HYDROCODONE-ACETAMINOPHEN 5-325 MG PO TABS
2.0000 | ORAL_TABLET | Freq: Once | ORAL | Status: AC
  Administered 2015-08-13: 2 via ORAL
  Filled 2015-08-13: qty 2

## 2015-08-13 MED ORDER — TRAMADOL HCL 50 MG PO TABS: 50.0000 mg | ORAL_TABLET | Freq: Four times a day (QID) | ORAL | Status: DC | PRN

## 2015-08-13 NOTE — ED Notes (Addendum)
Pt c/o RUQ abdominal pain that started today around 1600. Pt denies any nausea, vomiting, urinary or bowel problems. LBM this morning. Pt tender to palpation of RUQ of abdomen. Pt reports the pain is sharp and stabbing.

## 2015-08-13 NOTE — Discharge Instructions (Signed)

## 2015-08-13 NOTE — ED Provider Notes (Signed)
CSN: [id number]     Arrival date & time 08/13/15  1750 History   First MD Initiated Contact with Patient 08/13/15 1801     Chief Complaint  Patient presents with  . Abdominal Pain     (Consider location/radiation/quality/duration/timing/severity/associated sxs/prior Treatment) HPI Comments: Patient presents with complaints of pain in the right lower chest and right upper abdomen region that began approximately 2 hours before coming to the ER. Pain is severe and constant. She reports that she is feeling slightly short of breath secondary to the pain, pain worsens when she breathes or moves. She denies injury. No associated fever, nausea, vomiting, diarrhea. No pain in the lower abdomen or pelvic region. No urinary symptoms. No unusual bleeding or discharge.  Patient is a 29 y.o. female presenting with abdominal pain.  Abdominal Pain   Past Medical History  Diagnosis Date  . Herpes   . GERD (gastroesophageal reflux disease)   . Gastritis   . Disk prolapse   . Bulging disc   . Obesity   . Mental disorder     anxiety  . Other and unspecified ovarian cyst 12/01/2013  . Elevated liver enzymes 12/01/2013  . Ovarian cyst, right   . Headache   . Abdominal pain, chronic, right lower quadrant   . Abdominal pain, chronic, epigastric   . Contraceptive management 07/18/2014   Past Surgical History  Procedure Laterality Date  . Wisdom tooth extraction    . Esophagogastroduodenoscopy  01/19/2012    WUJ:WJXBJY,NWGNFAOZ IN THE ANTRUM/HIATAL HERNIA/  . Colonoscopy with esophagogastroduodenoscopy (egd) N/A 02/24/2013    HYQ:MVHQIONGEXBM RECTAL BLEEDING DUE TO Moderate sized internal hemorrhoids, EGD: erosive esophagitis due to uncontrolled reflux, moderate erosive gastritis, benign path   Family History  Problem Relation Age of Onset  . GER disease Mother   . Other Mother     diverticulitis; hernia  . Diabetes Mother     borderline  . Diabetes Other   . Colon cancer Neg Hx   . Colon polyps  Neg Hx   . Diabetes Maternal Grandmother   . COPD Maternal Grandmother   . Diabetes Maternal Grandfather   . Congestive Heart Failure Maternal Grandfather   . Headache Son    Social History  Substance Use Topics  . Smoking status: Former Smoker -- 0.25 packs/day for .5 years    Types: Cigarettes    Quit date: 07/28/2011  . Smokeless tobacco: Never Used  . Alcohol Use: No   OB History    Gravida Para Term Preterm AB TAB SAB Ectopic Multiple Living   Review of Systems  Gastrointestinal: Positive for abdominal pain.  All other systems reviewed and are negative.     Allergies  Review of patient's allergies indicates no known allergies.  Home Medications   Prior to Admission medications   Medication Sig Start Date End Date Taking? Authorizing Provider  DULoxetine (CYMBALTA) 60 MG capsule Take 60 mg by mouth at bedtime.    Historical Provider, MD  LYRICA 75 MG capsule Take 75 mg by mouth 2 (two) times daily. 10/15/14   Historical Provider, MD  medroxyPROGESTERone (DEPO-PROVERA) 150 MG/ML injection Inject 1 mL (150 mg total) into the muscle every 3 (three) months. 07/25/15   Adline Potter, NP  pantoprazole (PROTONIX) 40 MG tablet 1 PO 30 MINS PRIOR TO MEALS BID 06/20/15   Nira Retort, NP  tiZANidine (ZANAFLEX) 2 MG tablet Take 2 mg by  mouth at bedtime.     Historical Provider, MD  traMADol (ULTRAM) 50 MG tablet Take 50 mg by mouth every 6 (six) hours as needed (pain).  11/25/14   Historical Provider, MD  valACYclovir (VALTREX) 1000 MG tablet Take 1 tablet (1,000 mg total) by mouth daily. 07/25/15   Adline Potter, NP   BP 109/82 mmHg  Pulse 93  Temp(Src) 98 F (36.7 C) (Oral)  Resp 14  Ht  (1.676 m)  Wt 287 lb (130.182 kg)  BMI 46.35 kg/m2  SpO2 100%  LMP 07/18/2015 Physical Exam  Constitutional: She is oriented to person, place, and time. She appears well-developed and well-nourished. No distress.  HENT:  Head: Normocephalic and atraumatic.    Right Ear: Hearing normal.  Left Ear: Hearing normal.  Nose: Nose normal.  Mouth/Throat: Oropharynx is clear and moist and mucous membranes are normal.  Eyes: Conjunctivae and EOM are normal. Pupils are equal, round, and reactive to light.  Neck: Normal range of motion. Neck supple.  Cardiovascular: Regular rhythm, S1 normal and S2 normal.  Exam reveals no gallop and no friction rub.   No murmur heard. Pulmonary/Chest: Effort normal and breath sounds normal. No respiratory distress. She exhibits tenderness and bony tenderness (right costal margin). She exhibits no crepitus.    Abdominal: Soft. Normal appearance and bowel sounds are normal. There is no hepatosplenomegaly. There is no tenderness. There is no rebound, no guarding, no tenderness at McBurney's point and negative Murphy's sign. No hernia.  Musculoskeletal: Normal range of motion.  Neurological: She is alert and oriented to person, place, and time. She has normal strength. No cranial nerve deficit or sensory deficit. Coordination normal. GCS eye subscore is 4. GCS verbal subscore is 5. GCS motor subscore is 6.  Skin: Skin is warm, dry and intact. No rash noted. No cyanosis.  Psychiatric: She has a normal mood and affect. Her speech is normal and behavior is normal. Thought content normal.  Nursing note and vitals reviewed.   ED Course  Procedures (including critical care time) Labs Review Labs Reviewed - No data to display  Imaging Review No results found. I have personally reviewed and evaluated these images and lab results as part of my medical decision-making.   EKG Interpretation None      MDM   Final diagnoses:  None   right-sided chest wall pain  Resents to the ER for evaluation of pain on the right side of her upper abdomen and/or lower chest. She denies injury. Patient has an obese abdomen, but examination does not reveal any tenderness in the right upper quadrant. She does have tenderness over the costal  margin of the right ribs without crepitance. Chest x-ray does not show any lung pathology. LFTs are normal, all blood work was normal. Patient will be discharged, treated for musculoskeletal pain. Follow-up with Dr. Renard Matter, PCP. If symptoms continue consider outpatient ultrasound of gallbladder, but she does have a recent CT scan that did not show any pathology in the region.    [name], MD 08/13/15 [phone]

## 2015-08-13 NOTE — ED Notes (Signed)
Having pain to right upper abdomen for last 2 hours.  Rates pain 10/10.

## 2015-08-13 NOTE — ED Notes (Signed)
Dr. Blinda Leatherwood notified that pt is requesting pain medication.

## 2015-08-26 ENCOUNTER — Encounter: Payer: Self-pay | Admitting: Physician Assistant

## 2015-09-14 ENCOUNTER — Emergency Department (HOSPITAL_COMMUNITY)
Admission: EM | Admit: 2015-09-14 | Discharge: 2015-09-14 | Disposition: A | Discharge status: Home / Self Care | Payer: Medicaid Other | Attending: Emergency Medicine | Admitting: Emergency Medicine

## 2015-09-14 ENCOUNTER — Emergency Department (HOSPITAL_COMMUNITY): Payer: Medicaid Other

## 2015-09-14 ENCOUNTER — Encounter (HOSPITAL_COMMUNITY): Payer: Self-pay | Admitting: *Deleted

## 2015-09-14 DIAGNOSIS — R0789 Other chest pain: Secondary | ICD-10-CM

## 2015-09-14 DIAGNOSIS — K219 Gastro-esophageal reflux disease without esophagitis: Secondary | ICD-10-CM | POA: Insufficient documentation

## 2015-09-14 DIAGNOSIS — Z87891 Personal history of nicotine dependence: Secondary | ICD-10-CM | POA: Diagnosis not present

## 2015-09-14 DIAGNOSIS — Z791 Long term (current) use of non-steroidal anti-inflammatories (NSAID): Secondary | ICD-10-CM | POA: Insufficient documentation

## 2015-09-14 DIAGNOSIS — Z8619 Personal history of other infectious and parasitic diseases: Secondary | ICD-10-CM | POA: Diagnosis not present

## 2015-09-14 DIAGNOSIS — Z79899 Other long term (current) drug therapy: Secondary | ICD-10-CM | POA: Diagnosis not present

## 2015-09-14 DIAGNOSIS — R079 Chest pain, unspecified: Secondary | ICD-10-CM | POA: Diagnosis present

## 2015-09-14 DIAGNOSIS — E669 Obesity, unspecified: Secondary | ICD-10-CM | POA: Insufficient documentation

## 2015-09-14 DIAGNOSIS — G8929 Other chronic pain: Secondary | ICD-10-CM | POA: Diagnosis not present

## 2015-09-14 DIAGNOSIS — Z8742 Personal history of other diseases of the female genital tract: Secondary | ICD-10-CM | POA: Diagnosis not present

## 2015-09-14 DIAGNOSIS — Z79818 Long term (current) use of other agents affecting estrogen receptors and estrogen levels: Secondary | ICD-10-CM | POA: Diagnosis not present

## 2015-09-14 DIAGNOSIS — F419 Anxiety disorder, unspecified: Secondary | ICD-10-CM | POA: Diagnosis not present

## 2015-09-14 DIAGNOSIS — R05 Cough: Secondary | ICD-10-CM | POA: Insufficient documentation

## 2015-09-14 LAB — D-DIMER, QUANTITATIVE: D-Dimer, Quant: 0.44 ug/mL-FEU (ref 0.00–0.48)

## 2015-09-14 MED ORDER — KETOROLAC TROMETHAMINE 60 MG/2ML IM SOLN
60.0000 mg | Freq: Once | INTRAMUSCULAR | Status: AC
  Administered 2015-09-14: 60 mg via INTRAMUSCULAR
  Filled 2015-09-14: qty 2

## 2015-09-14 NOTE — ED Provider Notes (Signed)
CSN: [id number]     Arrival date & time 09/14/15  1743 History   First MD Initiated Contact with Patient 09/14/15 1829     Chief Complaint  Patient presents with  . Chest Pain     Patient is a 29 y.o. female presenting with chest pain. The history is provided by the patient and a parent. No language interpreter was used.  Chest Pain  [name] is a 29 year old woman with history of reflux disease here for evaluation of chest pain. She reports cough for the last week and today around 5 PM she developed acute onset left-sided chest pain. It is sharp and stabbing in nature. It is nonradiating. It is worse with palpation. She has mild shortness of breath. She denies any fevers, abdominal pain, vomiting, diarrhea, leg swelling or pain. She takes Depo-Provera. No history of DVT or PE. No recent surgery. Symptoms are moderate and constant.  Past Medical History  Diagnosis Date  . Herpes   . GERD (gastroesophageal reflux disease)   . Gastritis   . Disk prolapse   . Bulging disc   . Obesity   . Mental disorder     anxiety  . Other and unspecified ovarian cyst 12/01/2013  . Elevated liver enzymes 12/01/2013  . Ovarian cyst, right   . Headache   . Abdominal pain, chronic, right lower quadrant   . Abdominal pain, chronic, epigastric   . Contraceptive management 07/18/2014   Past Surgical History  Procedure Laterality Date  . Wisdom tooth extraction    . Esophagogastroduodenoscopy  01/19/2012    WUJ:WJXBJY,NWGNFAOZSLF:ULCERS,MULTIPLE IN THE ANTRUM/HIATAL HERNIA/  . Colonoscopy with esophagogastroduodenoscopy (egd) N/A 02/24/2013    HYQ:MVHQIONGEXBMSLF:INTERMITTENT RECTAL BLEEDING DUE TO Moderate sized internal hemorrhoids, EGD: erosive esophagitis due to uncontrolled reflux, moderate erosive gastritis, benign path   Family History  Problem Relation Age of Onset  . GER disease Mother   . Other Mother     diverticulitis; hernia  . Diabetes Mother     borderline  . Diabetes Other   . Colon cancer Neg Hx   . Colon polyps  Neg Hx   . Diabetes Maternal Grandmother   . COPD Maternal Grandmother   . Diabetes Maternal Grandfather   . Congestive Heart Failure Maternal Grandfather   . Headache Son    Social History  Substance Use Topics  . Smoking status: Former Smoker -- 0.25 packs/day for .5 years    Types: Cigarettes    Quit date: 07/28/2011  . Smokeless tobacco: Never Used  . Alcohol Use: No   OB History    Gravida Para Term Preterm AB TAB SAB Ectopic Multiple Living   1 1 1       1      Review of Systems  Cardiovascular: Positive for chest pain.  All other systems reviewed and are negative.     Allergies  Review of patient's allergies indicates no known allergies.  Home Medications   Prior to Admission medications   Medication Sig Start Date End Date Taking? Authorizing Provider  diclofenac (VOLTAREN) 50 MG EC tablet Take 50 mg by mouth 2 (two) times daily. 07/29/15   Historical Provider, MD  DULoxetine (CYMBALTA) 60 MG capsule Take 60 mg by mouth at bedtime.    Historical Provider, MD  LYRICA 150 MG capsule Take 150 mg by mouth 2 (two) times daily. 07/29/15   Historical Provider, MD  medroxyPROGESTERone (DEPO-PROVERA) 150 MG/ML injection Inject 1 mL (150 mg total) into the muscle every 3 (three) months.  07/25/15   Adline Potter, NP  pantoprazole (PROTONIX) 40 MG tablet 1 PO 30 MINS PRIOR TO MEALS BID Patient taking differently: Take 40 mg by mouth 2 (two) times daily before a meal.  06/20/15   Nira Retort, NP  tizanidine (ZANAFLEX) 2 MG capsule Take 2 mg by mouth every 8 (eight) hours as needed. Max of 3 daily 07/02/15   Historical Provider, MD  topiramate (TOPAMAX) 100 MG tablet Take 300 mg by mouth daily. 07/29/15   Historical Provider, MD  traMADol (ULTRAM) 50 MG tablet Take 1 tablet (50 mg total) by mouth every 6 (six) hours as needed. 08/13/15   [name], MD  valACYclovir (VALTREX) 1000 MG tablet Take 1 tablet (1,000 mg total) by mouth daily. 07/25/15   Adline Potter, NP    BP 94/56 mmHg  Pulse 90  Temp(Src) 98.2 F (36.8 C) (Oral)  Resp 18  Ht  (1.676 m)  Wt 308 lb (139.708 kg)  BMI 49.74 kg/m2  SpO2 100%  LMP 08/31/2015 Physical Exam  Constitutional: She is oriented to person, place, and time. She appears well-developed and well-nourished.  HENT:  Head: Normocephalic and atraumatic.  Cardiovascular: Normal rate and regular rhythm.   No murmur heard. Pulmonary/Chest: Effort normal and breath sounds normal. No respiratory distress.  Left upper chest wall tenderness to palpation  Abdominal: Soft. There is no tenderness. There is no rebound and no guarding.  Musculoskeletal: She exhibits no edema or tenderness.  Neurological: She is alert and oriented to person, place, and time.  Skin: Skin is warm and dry.  Psychiatric: She has a normal mood and affect. Her behavior is normal.  Nursing note and vitals reviewed.   ED Course  Procedures (including critical care time) Labs Review Labs Reviewed  D-DIMER, QUANTITATIVE (NOT AT [facility])    Imaging Review Dg Chest 2 View  09/14/2015  CLINICAL DATA:  Cough and fever for 1 week.  Chest pain for 1 day EXAM: CHEST  2 VIEW COMPARISON:  August 13, 2015 FINDINGS: The lungs are clear. The heart size and pulmonary vascularity are normal. No pneumothorax. No adenopathy. No bone lesions. IMPRESSION: No edema or consolidation. Electronically Signed   By: Bretta Bang III M.D.   On: 09/14/2015 19:18   I have personally reviewed and evaluated these images and lab results as part of my medical decision-making.   EKG Interpretation   Date/Time:  Saturday September 14 2015 18:11:25 EDT Ventricular Rate:  91 PR Interval:  169 QRS Duration: 86 QT Interval:  335 QTC Calculation: 412 R Axis:   85 Text Interpretation:  Sinus rhythm No significant change since last  tracing Confirmed by Lincoln Brigham [phone]) on 09/14/2015 6:42:51 PM      MDM   Final diagnoses:  Acute chest wall pain    Patient here  for evaluation of chest pain, and is reproducible on examination. Presentation is not consistent with ACS, dissection. Patient is low risk for PE, d-dimer negative. Discussed muscle skeletal chest pain, recommend ibuprofen at home. Discussed home care and return precautions.  Tilden Fossa, MD 09/15/15 [phone]

## 2015-09-14 NOTE — Discharge Instructions (Signed)
You can take ibuprofen, available over the counter, as needed for pain.  Take according to label instructions.   Chest Wall Pain Chest wall pain is pain in or around the bones and muscles of your chest. Sometimes, an injury causes this pain. Sometimes, the cause may not be known. This pain may take several weeks or longer to get better. HOME CARE INSTRUCTIONS  Pay attention to any changes in your symptoms. Take these actions to help with your pain:   Rest as told by your health care provider.   Avoid activities that cause pain. These include any activities that use your chest muscles or your abdominal and side muscles to lift heavy items.   If directed, apply ice to the painful area:  Put ice in a plastic bag.  Place a towel between your skin and the bag.  Leave the ice on for 20 minutes, 2-3 times per day.  Take over-the-counter and prescription medicines only as told by your health care provider.  Do not use tobacco products, including cigarettes, chewing tobacco, and e-cigarettes. If you need help quitting, ask your health care provider.  Keep all follow-up visits as told by your health care provider. This is important. SEEK MEDICAL CARE IF:  You have a fever.  Your chest pain becomes worse.  You have new symptoms. SEEK IMMEDIATE MEDICAL CARE IF:  You have nausea or vomiting.  You feel sweaty or light-headed.  You have a cough with phlegm (sputum) or you cough up blood.  You develop shortness of breath.   This information is not intended to replace advice given to you by your health care provider. Make sure you discuss any questions you have with your health care provider.   Document Released: 11/02/2005 Document Revised: 07/24/2015 Document Reviewed: 01/28/2015 Elsevier Interactive Patient Education 2016 Else[employer] Costochondritis Costochondritis, sometimes called Tietze syndrome, is a swelling and irritation (inflammation) of the tissue (cartilage) that  connects your ribs with your breastbone (sternum). It causes pain in the chest and rib area. Costochondritis usually goes away on its own over time. It can take up to 6 weeks or longer to get better, especially if you are unable to limit your activities. CAUSES  Some cases of costochondritis have no known cause. Possible causes include:  Injury (trauma).  Exercise or activity such as lifting.  Severe coughing. SIGNS AND SYMPTOMS  Pain and tenderness in the chest and rib area.  Pain that gets worse when coughing or taking deep breaths.  Pain that gets worse with specific movements. DIAGNOSIS  Your health care provider will do a physical exam and ask about your symptoms. Chest X-rays or other tests may be done to rule out other problems. TREATMENT  Costochondritis usually goes away on its own over time. Your health care provider may prescribe medicine to help relieve pain. HOME CARE INSTRUCTIONS   Avoid exhausting physical activity. Try not to strain your ribs during normal activity. This would include any activities using chest, abdominal, and side muscles, especially if heavy weights are used.  Apply ice to the affected area for the first 2 days after the pain begins.  Put ice in a plastic bag.  Place a towel between your skin and the bag.  Leave the ice on for 20 minutes, 2-3 times a day.  Only take over-the-counter or prescription medicines as directed by your health care provider. SEEK MEDICAL CARE IF:  You have redness or swelling at the rib joints. These are signs of infection.  Your pain does not go away despite rest or medicine. SEEK IMMEDIATE MEDICAL CARE IF:   Your pain increases or you are very uncomfortable.  You have shortness of breath or difficulty breathing.  You cough up blood.  You have worse chest pains, sweating, or vomiting.  You have a fever or persistent symptoms for more than 2-3 days.  You have a fever and your symptoms suddenly get  worse. MAKE SURE YOU:   Understand these instructions.  Will watch your condition.  Will get help right away if you are not doing well or get worse.   This information is not intended to replace advice given to you by your health care provider. Make sure you discuss any questions you have with your health care provider.   Document Released: 08/12/2005 Document Revised: 08/23/2013 Document Reviewed: 06/06/2013 Elsevier Interactive Patient Education [employer] Inc[employer]

## 2015-09-14 NOTE — ED Notes (Signed)
Pt states she began having chest pain at 5:15 pm today. States she is having some dizziness but denies all other symptoms.

## 2015-09-14 NOTE — ED Notes (Signed)
Discharge instructions given, pt demonstrated teach back and verbal understanding. No concerns voiced.  

## 2015-10-17 ENCOUNTER — Encounter: Payer: Self-pay | Admitting: *Deleted

## 2015-10-17 ENCOUNTER — Ambulatory Visit (INDEPENDENT_AMBULATORY_CARE_PROVIDER_SITE_OTHER): Payer: Medicaid Other | Admitting: *Deleted

## 2015-10-17 DIAGNOSIS — Z3042 Encounter for surveillance of injectable contraceptive: Secondary | ICD-10-CM | POA: Diagnosis not present

## 2015-10-17 DIAGNOSIS — Z3202 Encounter for pregnancy test, result negative: Secondary | ICD-10-CM | POA: Diagnosis not present

## 2015-10-17 LAB — POCT URINE PREGNANCY: Preg Test, Ur: NEGATIVE

## 2015-10-17 MED ORDER — MEDROXYPROGESTERONE ACETATE 150 MG/ML IM SUSP
150.0000 mg | Freq: Once | INTRAMUSCULAR | Status: AC
  Administered 2015-10-17: 150 mg via INTRAMUSCULAR

## 2015-10-17 NOTE — Progress Notes (Signed)
Pt here for Depo. Reports no problems at this time. Return in 12 weeks for next shot. JSY 

## 2015-12-16 SURGERY — Surgical Case
Anesthesia: *Unknown

## 2015-12-24 ENCOUNTER — Other Ambulatory Visit: Payer: Self-pay

## 2015-12-24 MED ORDER — PANTOPRAZOLE SODIUM 40 MG PO TBEC: 40.0000 mg | DELAYED_RELEASE_TABLET | Freq: Two times a day (BID) | ORAL | Status: DC

## 2016-01-01 ENCOUNTER — Encounter: Payer: Self-pay | Admitting: Nurse Practitioner

## 2016-01-01 ENCOUNTER — Ambulatory Visit (INDEPENDENT_AMBULATORY_CARE_PROVIDER_SITE_OTHER): Payer: Medicaid Other | Admitting: Nurse Practitioner

## 2016-01-01 VITALS — BP 124/82 | HR 89 | Temp 97.2°F | Ht 66.0 in | Wt 304.4 lb

## 2016-01-01 DIAGNOSIS — R1084 Generalized abdominal pain: Secondary | ICD-10-CM | POA: Diagnosis not present

## 2016-01-01 DIAGNOSIS — K5900 Constipation, unspecified: Secondary | ICD-10-CM | POA: Diagnosis not present

## 2016-01-01 DIAGNOSIS — K219 Gastro-esophageal reflux disease without esophagitis: Secondary | ICD-10-CM | POA: Diagnosis not present

## 2016-01-01 DIAGNOSIS — R109 Unspecified abdominal pain: Secondary | ICD-10-CM | POA: Insufficient documentation

## 2016-01-01 MED ORDER — PANTOPRAZOLE SODIUM 40 MG PO TBEC: 40.0000 mg | DELAYED_RELEASE_TABLET | Freq: Two times a day (BID) | ORAL | Status: DC

## 2016-01-01 NOTE — Assessment & Plan Note (Signed)
Patient with generalized mid anterior abdominal pain. Worse to palpation in the left lower quadrant. Associated constipation is a likely etiology to her pain. Constipation management as noted above. Return for follow-up in 3 months to further evaluate. Call us if any worsening or severe symptoms.

## 2016-01-01 NOTE — Progress Notes (Signed)
Referring Provider: Butch Penny, MD Primary Care Physician:  Pearson Grippe, MD Primary GI:  Dr. Darrick Penna  Chief Complaint  Patient presents with  . Follow-up  . Medication Refill    HPI:   [name] is a 30 y.o. female who presents for follow-up on GERD and medication refills. Last seen in our office 01/17/2015 at which point GERD symptoms are well controlled and she eats the wrong thing. She was told that if she eats the wrong foods that are known to trigger her symptoms she will have symptoms and likely vomiting. Recommend she avoid sugar foods. Continue weight loss efforts, continue Protonix 30 minutes before meals twice a day. Recommended return for follow-up in 4 months, which was not done.   Today she states she's ok. Has increased her weight by about 30 lbs since her visit last year. Her GERD symptoms have been doing well. Has been having mid-abdominal pain which occurs twice a day, lasts 30-60 minutes at a time, described as a sharp stabbing pain. Denies N/V. Has had increased constipation over the last month. Has had a history of episodic constipation in her past. Currently has a bowel movement 2 times a day or more. First stool of the day is hard and required straining. Subsequent stool(s) are looser. Denies hematochezia, melena, fever, chills. Denies chest pain, dyspnea, dizziness, lightheadedness, syncope, near syncope. Denies any other upper or lower GI symptoms.  Past Medical History  Diagnosis Date  . Herpes   . GERD (gastroesophageal reflux disease)   . Gastritis   . Disk prolapse   . Bulging disc   . Obesity   . Mental disorder     anxiety  . Other and unspecified ovarian cyst 12/01/2013  . Elevated liver enzymes 12/01/2013  . Ovarian cyst, right   . Headache   . Abdominal pain, chronic, right lower quadrant   . Abdominal pain, chronic, epigastric   . Contraceptive management 07/18/2014    Past Surgical History  Procedure Laterality Date  . Wisdom tooth  extraction    . Esophagogastroduodenoscopy  01/19/2012    [id number] IN THE ANTRUM/HIATAL HERNIA/  . Colonoscopy with esophagogastroduodenoscopy (egd) N/A 02/24/2013    [id number] RECTAL BLEEDING DUE TO Moderate sized internal hemorrhoids, EGD: erosive esophagitis due to uncontrolled reflux, moderate erosive gastritis, benign path    Current Outpatient Prescriptions  Medication Sig Dispense Refill  . diclofenac (VOLTAREN) 50 MG EC tablet Take 50 mg by mouth 2 (two) times daily.  2  . DULoxetine (CYMBALTA) 60 MG capsule Take 60 mg by mouth at bedtime.    Marland Kitchen LYRICA 150 MG capsule Take 150 mg by mouth 2 (two) times daily.  2  . medroxyPROGESTERone (DEPO-PROVERA) 150 MG/ML injection Inject 1 mL (150 mg total) into the muscle every 3 (three) months. 1 mL 4  . pantoprazole (PROTONIX) 40 MG tablet Take 1 tablet (40 mg total) by mouth 2 (two) times daily before a meal. 60 tablet 3  . tizanidine (ZANAFLEX) 2 MG capsule Take 2 mg by mouth every 8 (eight) hours as needed. Max of 3 daily  2  . topiramate (TOPAMAX) 100 MG tablet Take 300 mg by mouth daily.  2  . traMADol (ULTRAM) 50 MG tablet Take 1 tablet (50 mg total) by mouth every 6 (six) hours as needed. 15 tablet 0  . valACYclovir (VALTREX) 1000 MG tablet Take 1 tablet (1,000 mg total) by mouth daily. 30 tablet 11   No current facility-administered medications for this visit.  Allergies as of 01/01/2016  . (No Known Allergies)    Family History  Problem Relation Age of Onset  . GER disease Mother   . Other Mother     diverticulitis; hernia  . Diabetes Mother     borderline  . Diabetes Other   . Colon cancer Neg Hx   . Colon polyps Neg Hx   . Diabetes Maternal Grandmother   . COPD Maternal Grandmother   . Diabetes Maternal Grandfather   . Congestive Heart Failure Maternal Grandfather   . Headache Son     Social History   Social History  . Marital Status: Single    Spouse Name: N/A  . Number of Children: 1  .  Years of Education: N/A   Occupational History  .    [employer] Unemployed    Social History Main Topics  . Smoking status: Former Smoker -- 0.25 packs/day for .5 years    Types: Cigarettes    Quit date: 07/28/2011  . Smokeless tobacco: Never Used  . Alcohol Use: No  . Drug Use: No  . Sexual Activity: Yes    Birth Control/ Protection: Injection   Other Topics Concern  . None   Social History Narrative    Review of Systems: `0-point ROS negative except as per HPI.   Physical Exam: BP 124/82 mmHg  Pulse 89  Temp(Src) 97.2 F (36.2 C)  Ht  (1.676 m)  Wt 304 lb 6.4 oz (138.075 kg)  BMI 49.15 kg/m2  LMP 12/04/2015 (Approximate) General:   Morbidly obese female, alert and oriented. Pleasant and cooperative. Well-nourished and well-developed.  Head:  Normocephalic and atraumatic. Cardiovascular:  S1, S2 present without murmurs appreciated. Extremities without clubbing or edema. Respiratory:  Clear to auscultation bilaterally. No wheezes, rales, or rhonchi. No distress.  Gastrointestinal:  +BS, obese but soft, and non-distended. LLQ TTP noted. No HSM noted. No guarding or rebound. No masses appreciated.  Rectal:  Deferred  Musculoskalatal:  Symmetrical without gross deformities. Skin:  Intact without significant lesions or rashes. Neurologic:  Alert and oriented x4;  grossly normal neurologically. Psych:  Alert and cooperative. Normal mood and affect.    01/01/2016 1:40 PM

## 2016-01-01 NOTE — Assessment & Plan Note (Signed)
Patient with constipation is likely etiology to her abdominal pain. Drinks minimal to no water, minimal fiber intake. Recommend she increase her water intake, take a daily fiber supplement. Take MiraLAX 17 g once a day to promote adequate bowel movements. Call with results in 2-4 weeks. If no improvement can consider starting constipation prescription medication such as Linzess. Return for follow-up in 3 months.

## 2016-01-01 NOTE — Assessment & Plan Note (Signed)
GERD symptoms well controlled on Protonix. Try to avoid offending foods. Has put on 30 pounds in the past year and is wanting help with weight loss. Recommended she contact her primary care provider or referral to possible physician guided weight management. Offered a nutrition referral if needed. Return for follow-up in 3 months.

## 2016-01-01 NOTE — Patient Instructions (Addendum)
1. I sent a refill of Protonix to your pharmacy. 2. Increase the amount of watery you drink daily. You should drink at least 8 glasses (64 ounces) of water a day. This includes water based drinks such as water with added Crystal Light flavoring, natural fruit, etc. 3. Take a daily fiber supplement. There are many options to trauma including Benefiber chews, Benefiber powders, fiber dummies bears, etc. If you have problems choosing an option, you can consult with the pharmacist. 4. Take MiraLAX over-the-counter powder 17 g daily. Mix 17 g (1 capful) into a liquid of your choice such as water. It will dissolve clear, tasteless, colorless. 5. Return for follow-up in 3 months. 6. Contact her primary care provider related to a referral to possible physician guided weight management program. 7. Call us if any worsening or severe symptoms.    Constipation, Adult Constipation is when a person has fewer than three bowel movements a week, has difficulty having a bowel movement, or has stools that are dry, hard, or larger than normal. As people grow older, constipation is more common. A low-fiber diet, not taking in enough fluids, and taking certain medicines may make constipation worse.  CAUSES   Certain medicines, such as antidepressants, pain medicine, iron supplements, antacids, and water pills.   Certain diseases, such as diabetes, irritable bowel syndrome (IBS), thyroid disease, or depression.   Not drinking enough water.   Not eating enough fiber-rich foods.   Stress or travel.   Lack of physical activity or exercise.   Ignoring the urge to have a bowel movement.   Using laxatives too much.  SIGNS AND SYMPTOMS   Having fewer than three bowel movements a week.   Straining to have a bowel movement.   Having stools that are hard, dry, or larger than normal.   Feeling full or bloated.   Pain in the lower abdomen.   Not feeling relief after having a bowel movement.   DIAGNOSIS  Your health care provider will take a medical history and perform a physical exam. Further testing may be done for severe constipation. Some tests may include:  A barium enema X-ray to examine your rectum, colon, and, sometimes, your small intestine.   A sigmoidoscopy to examine your lower colon.   A colonoscopy to examine your entire colon. TREATMENT  Treatment will depend on the severity of your constipation and what is causing it. Some dietary treatments include drinking more fluids and eating more fiber-rich foods. Lifestyle treatments may include regular exercise. If these diet and lifestyle recommendations do not help, your health care provider may recommend taking over-the-counter laxative medicines to help you have bowel movements. Prescription medicines may be prescribed if over-the-counter medicines do not work.  HOME CARE INSTRUCTIONS   Eat foods that have a lot of fiber, such as fruits, vegetables, whole grains, and beans.  Limit foods high in fat and processed sugars, such as french fries, hamburgers, cookies, candies, and soda.   A fiber supplement may be added to your diet if you cannot get enough fiber from foods.   Drink enough fluids to keep your urine clear or pale yellow.   Exercise regularly or as directed by your health care provider.   Go to the restroom when you have the urge to go. Do not hold it.   Only take over-the-counter or prescription medicines as directed by your health care provider. Do not take other medicines for constipation without talking to your health care provider first.  SEEK IMMEDIATE  MEDICAL CARE IF:   You have bright red blood in your stool.   Your constipation lasts for more than 4 days or gets worse.   You have abdominal or rectal pain.   You have thin, pencil-like stools.   You have unexplained weight loss. MAKE SURE YOU:   Understand these instructions.  Will watch your condition.  Will get help  right away if you are not doing well or get worse.   This information is not intended to replace advice given to you by your health care provider. Make sure you discuss any questions you have with your health care provider.   Document Released: 07/31/2004 Document Revised: 11/23/2014 Document Reviewed: 08/14/2013 Elsevier Interactive Patient Education [employer]

## 2016-01-02 NOTE — Progress Notes (Signed)
cc'ed to pcp °

## 2016-01-09 ENCOUNTER — Ambulatory Visit: Payer: Medicaid Other

## 2016-01-10 ENCOUNTER — Encounter: Payer: Self-pay | Admitting: *Deleted

## 2016-01-10 ENCOUNTER — Ambulatory Visit (INDEPENDENT_AMBULATORY_CARE_PROVIDER_SITE_OTHER): Payer: Medicaid Other | Admitting: *Deleted

## 2016-01-10 DIAGNOSIS — Z3042 Encounter for surveillance of injectable contraceptive: Secondary | ICD-10-CM

## 2016-01-10 DIAGNOSIS — Z3202 Encounter for pregnancy test, result negative: Secondary | ICD-10-CM | POA: Diagnosis not present

## 2016-01-10 LAB — POCT URINE PREGNANCY: PREG TEST UR: NEGATIVE

## 2016-01-10 MED ORDER — MEDROXYPROGESTERONE ACETATE 150 MG/ML IM SUSP
150.0000 mg | Freq: Once | INTRAMUSCULAR | Status: AC
  Administered 2016-01-10: 150 mg via INTRAMUSCULAR

## 2016-01-10 NOTE — Progress Notes (Signed)
Pt here for Depo. Pt tolerated shot well. Return in 12 weeks for next shot. JSY 

## 2016-01-12 ENCOUNTER — Encounter (HOSPITAL_COMMUNITY): Payer: Self-pay | Admitting: Emergency Medicine

## 2016-01-12 ENCOUNTER — Emergency Department (HOSPITAL_COMMUNITY)
Admission: EM | Admit: 2016-01-12 | Discharge: 2016-01-12 | Disposition: A | Discharge status: Home / Self Care | Payer: Medicaid Other | Attending: Emergency Medicine | Admitting: Emergency Medicine

## 2016-01-12 DIAGNOSIS — E669 Obesity, unspecified: Secondary | ICD-10-CM | POA: Diagnosis not present

## 2016-01-12 DIAGNOSIS — K219 Gastro-esophageal reflux disease without esophagitis: Secondary | ICD-10-CM | POA: Diagnosis not present

## 2016-01-12 DIAGNOSIS — Z79899 Other long term (current) drug therapy: Secondary | ICD-10-CM | POA: Diagnosis not present

## 2016-01-12 DIAGNOSIS — Z8619 Personal history of other infectious and parasitic diseases: Secondary | ICD-10-CM | POA: Insufficient documentation

## 2016-01-12 DIAGNOSIS — H9203 Otalgia, bilateral: Secondary | ICD-10-CM | POA: Diagnosis not present

## 2016-01-12 DIAGNOSIS — J018 Other acute sinusitis: Secondary | ICD-10-CM | POA: Diagnosis not present

## 2016-01-12 DIAGNOSIS — Z791 Long term (current) use of non-steroidal anti-inflammatories (NSAID): Secondary | ICD-10-CM | POA: Diagnosis not present

## 2016-01-12 DIAGNOSIS — Z87891 Personal history of nicotine dependence: Secondary | ICD-10-CM | POA: Insufficient documentation

## 2016-01-12 DIAGNOSIS — R51 Headache: Secondary | ICD-10-CM

## 2016-01-12 DIAGNOSIS — Z8742 Personal history of other diseases of the female genital tract: Secondary | ICD-10-CM | POA: Diagnosis not present

## 2016-01-12 DIAGNOSIS — R519 Headache, unspecified: Secondary | ICD-10-CM

## 2016-01-12 DIAGNOSIS — G8929 Other chronic pain: Secondary | ICD-10-CM | POA: Insufficient documentation

## 2016-01-12 DIAGNOSIS — F419 Anxiety disorder, unspecified: Secondary | ICD-10-CM | POA: Insufficient documentation

## 2016-01-12 MED ORDER — DEXAMETHASONE SODIUM PHOSPHATE 4 MG/ML IJ SOLN
8.0000 mg | Freq: Once | INTRAMUSCULAR | Status: AC
  Administered 2016-01-12: 8 mg via INTRAMUSCULAR
  Filled 2016-01-12: qty 2

## 2016-01-12 MED ORDER — AMOXICILLIN 500 MG PO CAPS: 500.0000 mg | ORAL_CAPSULE | Freq: Three times a day (TID) | ORAL | Status: DC

## 2016-01-12 MED ORDER — PROMETHAZINE HCL 12.5 MG PO TABS
12.5000 mg | ORAL_TABLET | Freq: Once | ORAL | Status: AC
  Administered 2016-01-12: 12.5 mg via ORAL
  Filled 2016-01-12: qty 1

## 2016-01-12 MED ORDER — AMOXICILLIN 250 MG PO CAPS
500.0000 mg | ORAL_CAPSULE | Freq: Once | ORAL | Status: AC
  Administered 2016-01-12: 500 mg via ORAL
  Filled 2016-01-12: qty 2

## 2016-01-12 MED ORDER — HYDROCODONE-ACETAMINOPHEN 5-325 MG PO TABS
2.0000 | ORAL_TABLET | Freq: Once | ORAL | Status: AC
  Administered 2016-01-12: 2 via ORAL
  Filled 2016-01-12: qty 2

## 2016-01-12 NOTE — ED Provider Notes (Signed)
CSN: [id number]     Arrival date & time 01/12/16  1532 History  By signing my name below, I, Ronney Lion, attest that this documentation has been prepared under the direction and in the presence of Ivery Quale, PA-C. Electronically Signed: Ronney Lion, ED Scribe. 01/12/2016. 3:57 PM.    Chief Complaint  Patient presents with  . Otalgia   Patient is a 30 y.o. female presenting with ear pain. The history is provided by the patient. No language interpreter was used.  Otalgia Location:  Bilateral Behind ear:  No abnormality Quality:  Aching Severity:  Severe Onset quality:  Gradual Duration:  1 week Timing:  Constant Progression:  Worsening Chronicity:  New Context: not direct blow, not foreign body in ear and not loud noise   Relieved by:  None tried Worsened by:  Nothing tried Ineffective treatments:  None tried Associated symptoms: headaches   Associated symptoms: no sore throat    HPI Comments: [name] is a 30 y.o. female who presents to the Emergency Department complaining of constant bilateral otalgia radiating to her generalized head that began 1 week ago. She states her left ear started to hurt 1 week ago, while her right ear began to hurt yesterday. She notes some associated chills. She denies any trauma, injury, exposure to loud noises, sinus or nasal congestion. She denies a history of any ear surgeries. She denies any sore throat. Patient has NKDA. She denies the possibility of pregnancy or breastfeeding. Patient's mother is driving her home today.  Past Medical History  Diagnosis Date  . Herpes   . GERD (gastroesophageal reflux disease)   . Gastritis   . Disk prolapse   . Bulging disc   . Obesity   . Mental disorder     anxiety  . Other and unspecified ovarian cyst 12/01/2013  . Elevated liver enzymes 12/01/2013  . Ovarian cyst, right   . Headache   . Abdominal pain, chronic, right lower quadrant   . Abdominal pain, chronic, epigastric   . Contraceptive  management 07/18/2014   Past Surgical History  Procedure Laterality Date  . Wisdom tooth extraction    . Esophagogastroduodenoscopy  01/19/2012    [id number] IN THE ANTRUM/HIATAL HERNIA/  . Colonoscopy with esophagogastroduodenoscopy (egd) N/A 02/24/2013    [id number] RECTAL BLEEDING DUE TO Moderate sized internal hemorrhoids, EGD: erosive esophagitis due to uncontrolled reflux, moderate erosive gastritis, benign path   Family History  Problem Relation Age of Onset  . GER disease Mother   . Other Mother     diverticulitis; hernia  . Diabetes Mother     borderline  . Diabetes Other   . Colon cancer Neg Hx   . Colon polyps Neg Hx   . Diabetes Maternal Grandmother   . COPD Maternal Grandmother   . Diabetes Maternal Grandfather   . Congestive Heart Failure Maternal Grandfather   . Headache Son    Social History  Substance Use Topics  . Smoking status: Former Smoker -- 0.25 packs/day for .5 years    Types: Cigarettes    Quit date: 07/28/2011  . Smokeless tobacco: Never Used  . Alcohol Use: No   OB History    Gravida Para Term Preterm AB TAB SAB Ectopic Multiple Living   1 1 1       1      Review of Systems  Constitutional: Positive for chills.  HENT: Positive for ear pain. Negative for sore throat.   Neurological: Positive for headaches.  All other  systems reviewed and are negative.  Allergies  Review of patient's allergies indicates no known allergies.  Home Medications   Prior to Admission medications   Medication Sig Start Date End Date Taking? Authorizing Provider  diclofenac (VOLTAREN) 50 MG EC tablet Take 50 mg by mouth 2 (two) times daily. 07/29/15   Historical Provider, MD  DULoxetine (CYMBALTA) 60 MG capsule Take 60 mg by mouth at bedtime.    Historical Provider, MD  LYRICA 150 MG capsule Take 150 mg by mouth 2 (two) times daily. 07/29/15   Historical Provider, MD  medroxyPROGESTERone (DEPO-PROVERA) 150 MG/ML injection Inject 1 mL (150 mg total) into  the muscle every 3 (three) months. 07/25/15   Adline Potter, NP  pantoprazole (PROTONIX) 40 MG tablet Take 1 tablet (40 mg total) by mouth 2 (two) times daily before a meal. 01/01/16   Anice Paganini, NP  tizanidine (ZANAFLEX) 2 MG capsule Take 2 mg by mouth every 8 (eight) hours as needed. Max of 3 daily 07/02/15   Historical Provider, MD  topiramate (TOPAMAX) 100 MG tablet Take 300 mg by mouth daily. 07/29/15   Historical Provider, MD  traMADol (ULTRAM) 50 MG tablet Take 1 tablet (50 mg total) by mouth every 6 (six) hours as needed. 08/13/15   [name], MD  valACYclovir (VALTREX) 1000 MG tablet Take 1 tablet (1,000 mg total) by mouth daily. 07/25/15   Adline Potter, NP   BP 111/58 mmHg  Pulse 96  Temp(Src) 97.8 F (36.6 C) (Oral)  Resp 18  Ht  (1.676 m)  Wt 308 lb (139.708 kg)  BMI 49.74 kg/m2  SpO2 100%  LMP 12/04/2015 Physical Exam  Constitutional: She is oriented to person, place, and time. She appears well-developed and well-nourished. No distress.  HENT:  Head: Normocephalic and atraumatic.  Right Ear: Hearing, tympanic membrane, external ear and ear canal normal.  Left Ear: Hearing, tympanic membrane, external ear and ear canal normal.  Bilateral ears: There are no pre- or post-auricular nodes appreciated bilaterally. The TMs are normal.  Pain to percussion over the left maxillary and left frontal sinuses.  Eyes: Conjunctivae and EOM are normal. Pupils are equal, round, and reactive to light.  Normal eye exam.  Neck: Normal range of motion. Neck supple. No tracheal deviation present.  FROM of neck. No rigidity.  Cardiovascular: Normal rate and regular rhythm.   Normal cardiac exam.  Pulmonary/Chest: Effort normal. No respiratory distress.  Lungs are clear to auscultation.   Musculoskeletal: Normal range of motion.  Lymphadenopathy:    She has no cervical adenopathy.  Neurological: She is alert and oriented to person, place, and time.  Skin: Skin is warm  and dry.  Psychiatric: She has a normal mood and affect. Her behavior is normal.  Nursing note and vitals reviewed.   ED Course  Procedures (including critical care time)  DIAGNOSTIC STUDIES: Oxygen Saturation is 100% on RA, normal by my interpretation.    COORDINATION OF CARE: 3:56 PM - Discussed treatment plan with pt at bedside which includes Rx Decadron, Amoxil, and Norco. Will give referral to ENT specialist for pt to see if her symptoms do not improve in 4-5 days. Pt verbalized understanding and agreed to plan.   MDM  Vital signs reviewed. The examination favors a sinus headache with sinusitis. Patient will be referred to ear nose and throat. Prescription for Decadron, Amoxil, and Norco given to the patient. Patient is to return to the emergency department if any changes, problems,  or concerns.    Final diagnoses:  Other acute sinusitis  Sinus headache    **I personally performed the services described in this documentation, which was scribed in my presence. The recorded information has been reviewed and is accurate.Ivery Quale, PA-C 01/13/16 1255  Samuel Jester, DO 01/15/16 2121

## 2016-01-12 NOTE — ED Notes (Signed)
Patient complaining of bilateral earache x 1 week. States "it started with my left one and then my right one started hurting. Now it's making my head hurt."

## 2016-01-12 NOTE — ED Notes (Signed)
Sitter remains at bedside

## 2016-01-14 ENCOUNTER — Encounter (HOSPITAL_COMMUNITY): Payer: Self-pay

## 2016-01-14 ENCOUNTER — Emergency Department (HOSPITAL_COMMUNITY)
Admission: EM | Admit: 2016-01-14 | Discharge: 2016-01-14 | Disposition: A | Discharge status: Home / Self Care | Payer: Medicaid Other | Attending: Emergency Medicine | Admitting: Emergency Medicine

## 2016-01-14 ENCOUNTER — Emergency Department (HOSPITAL_COMMUNITY): Payer: Medicaid Other

## 2016-01-14 DIAGNOSIS — Z792 Long term (current) use of antibiotics: Secondary | ICD-10-CM | POA: Insufficient documentation

## 2016-01-14 DIAGNOSIS — H9203 Otalgia, bilateral: Secondary | ICD-10-CM | POA: Diagnosis not present

## 2016-01-14 DIAGNOSIS — J0100 Acute maxillary sinusitis, unspecified: Secondary | ICD-10-CM | POA: Insufficient documentation

## 2016-01-14 DIAGNOSIS — K219 Gastro-esophageal reflux disease without esophagitis: Secondary | ICD-10-CM | POA: Diagnosis not present

## 2016-01-14 DIAGNOSIS — Z8742 Personal history of other diseases of the female genital tract: Secondary | ICD-10-CM | POA: Diagnosis not present

## 2016-01-14 DIAGNOSIS — E669 Obesity, unspecified: Secondary | ICD-10-CM | POA: Diagnosis not present

## 2016-01-14 DIAGNOSIS — R42 Dizziness and giddiness: Secondary | ICD-10-CM | POA: Insufficient documentation

## 2016-01-14 DIAGNOSIS — G8929 Other chronic pain: Secondary | ICD-10-CM | POA: Diagnosis not present

## 2016-01-14 DIAGNOSIS — Z79899 Other long term (current) drug therapy: Secondary | ICD-10-CM | POA: Diagnosis not present

## 2016-01-14 DIAGNOSIS — Z87891 Personal history of nicotine dependence: Secondary | ICD-10-CM | POA: Insufficient documentation

## 2016-01-14 DIAGNOSIS — F419 Anxiety disorder, unspecified: Secondary | ICD-10-CM | POA: Insufficient documentation

## 2016-01-14 DIAGNOSIS — Z8619 Personal history of other infectious and parasitic diseases: Secondary | ICD-10-CM | POA: Insufficient documentation

## 2016-01-14 DIAGNOSIS — R51 Headache: Secondary | ICD-10-CM | POA: Diagnosis present

## 2016-01-14 DIAGNOSIS — Z791 Long term (current) use of non-steroidal anti-inflammatories (NSAID): Secondary | ICD-10-CM | POA: Diagnosis not present

## 2016-01-14 MED ORDER — HYDROCODONE-ACETAMINOPHEN 5-325 MG PO TABS: 1.0000 | ORAL_TABLET | ORAL | Status: DC | PRN

## 2016-01-14 MED ORDER — DIPHENHYDRAMINE HCL 50 MG/ML IJ SOLN
50.0000 mg | Freq: Once | INTRAMUSCULAR | Status: AC
  Administered 2016-01-14: 50 mg via INTRAMUSCULAR
  Filled 2016-01-14: qty 1

## 2016-01-14 MED ORDER — PROCHLORPERAZINE EDISYLATE 5 MG/ML IJ SOLN
10.0000 mg | Freq: Once | INTRAMUSCULAR | Status: AC
  Administered 2016-01-14: 10 mg via INTRAMUSCULAR
  Filled 2016-01-14: qty 2

## 2016-01-14 MED ORDER — PREDNISONE 10 MG PO TABS
60.0000 mg | ORAL_TABLET | Freq: Once | ORAL | Status: AC
  Administered 2016-01-14: 60 mg via ORAL
  Filled 2016-01-14: qty 1

## 2016-01-14 NOTE — ED Provider Notes (Signed)
CSN: [id number]     Arrival date & time 01/14/16  1734 History   First MD Initiated Contact with Patient 01/14/16 1835     Chief Complaint  Patient presents with  . Headache     (Consider location/radiation/quality/duration/timing/severity/associated sxs/prior Treatment) The history is provided by the patient.   [name] is a 30 y.o. female presenting with persistent and worsening forehead and bilateral ear pain, right greater than left which started over the past week, but worsened over the past 2 days. She was seen here 2 days ago and was diagnosed with sinus headache and acute sinusitis.  She is currently on day 2 of amoxil but endorses worsening sx.   She describes sharp pain and pressure in her right ear, also endorses occasional sensation of lightheadedness, worse with positional changes, but fleeting.  She is not currently lightheaded.     Past Medical History  Diagnosis Date  . Herpes   . GERD (gastroesophageal reflux disease)   . Gastritis   . Disk prolapse   . Bulging disc   . Obesity   . Mental disorder     anxiety  . Other and unspecified ovarian cyst 12/01/2013  . Elevated liver enzymes 12/01/2013  . Ovarian cyst, right   . Headache   . Abdominal pain, chronic, right lower quadrant   . Abdominal pain, chronic, epigastric   . Contraceptive management 07/18/2014   Past Surgical History  Procedure Laterality Date  . Wisdom tooth extraction    . Esophagogastroduodenoscopy  01/19/2012    WUJ:WJXBJY,NWGNFAOZ IN THE ANTRUM/HIATAL HERNIA/  . Colonoscopy with esophagogastroduodenoscopy (egd) N/A 02/24/2013    HYQ:MVHQIONGEXBM RECTAL BLEEDING DUE TO Moderate sized internal hemorrhoids, EGD: erosive esophagitis due to uncontrolled reflux, moderate erosive gastritis, benign path   Family History  Problem Relation Age of Onset  . GER disease Mother   . Other Mother     diverticulitis; hernia  . Diabetes Mother     borderline  . Diabetes Other   . Colon cancer Neg Hx    . Colon polyps Neg Hx   . Diabetes Maternal Grandmother   . COPD Maternal Grandmother   . Diabetes Maternal Grandfather   . Congestive Heart Failure Maternal Grandfather   . Headache Son    Social History  Substance Use Topics  . Smoking status: Former Smoker -- 0.25 packs/day for .5 years    Types: Cigarettes    Quit date: 07/28/2011  . Smokeless tobacco: Never Used  . Alcohol Use: No   OB History    Gravida Para Term Preterm AB TAB SAB Ectopic Multiple Living   Review of Systems  Constitutional: Negative for fever.  HENT: Positive for ear pain and sinus pressure. Negative for congestion, ear discharge, facial swelling and sore throat.   Eyes: Negative.   Respiratory: Negative for chest tightness and shortness of breath.   Cardiovascular: Negative for chest pain.  Gastrointestinal: Negative for nausea and abdominal pain.  Genitourinary: Negative.  Negative for menstrual problem.       On depo   Musculoskeletal: Negative for joint swelling, arthralgias and neck pain.  Skin: Negative.  Negative for rash and wound.  Neurological: Positive for light-headedness and headaches. Negative for dizziness, weakness and numbness.  Psychiatric/Behavioral: Negative.       Allergies  Review of patient's allergies indicates no known allergies.  Home Medications   Prior to Admission medications   Medication  Sig Start Date End Date Taking? Authorizing Provider  amoxicillin (AMOXIL) 500 MG capsule Take 1 capsule (500 mg total) by mouth 3 (three) times daily. 01/12/16   Ivery Quale, PA-C  diclofenac (VOLTAREN) 50 MG EC tablet Take 50 mg by mouth 2 (two) times daily. 07/29/15   Historical Provider, MD  DULoxetine (CYMBALTA) 60 MG capsule Take 60 mg by mouth at bedtime.    Historical Provider, MD  HYDROcodone-acetaminophen (NORCO/VICODIN) 5-325 MG tablet Take 1 tablet by mouth every 4 (four) hours as needed for moderate pain. 01/14/16   Burgess Amor, PA-C  LYRICA 150 MG  capsule Take 150 mg by mouth 2 (two) times daily. 07/29/15   Historical Provider, MD  medroxyPROGESTERone (DEPO-PROVERA) 150 MG/ML injection Inject 1 mL (150 mg total) into the muscle every 3 (three) months. 07/25/15   Adline Potter, NP  pantoprazole (PROTONIX) 40 MG tablet Take 1 tablet (40 mg total) by mouth 2 (two) times daily before a meal. 01/01/16   Anice Paganini, NP  tizanidine (ZANAFLEX) 2 MG capsule Take 2 mg by mouth every 8 (eight) hours as needed. Max of 3 daily 07/02/15   Historical Provider, MD  topiramate (TOPAMAX) 100 MG tablet Take 300 mg by mouth daily. 07/29/15   Historical Provider, MD  traMADol (ULTRAM) 50 MG tablet Take 1 tablet (50 mg total) by mouth every 6 (six) hours as needed. 08/13/15   [name], MD  valACYclovir (VALTREX) 1000 MG tablet Take 1 tablet (1,000 mg total) by mouth daily. 07/25/15   Adline Potter, NP   BP 99/60 mmHg  Pulse 96  Temp(Src) 98.3 F (36.8 C) (Oral)  Resp 16  Ht 5\' 6"  (1.676 m)  Wt 139.708 kg  BMI 49.74 kg/m2  SpO2 100%  LMP 12/04/2015 Physical Exam  Constitutional: She is oriented to person, place, and time. She appears well-developed and well-nourished.  Uncomfortable appearing  HENT:  Head: Normocephalic and atraumatic.  Nose: Right sinus exhibits frontal sinus tenderness. Left sinus exhibits maxillary sinus tenderness.  Mouth/Throat: Oropharynx is clear and moist.  Eyes: EOM are normal. Pupils are equal, round, and reactive to light.  Neck: Normal range of motion. Neck supple.  Cardiovascular: Normal rate and normal heart sounds.   Pulmonary/Chest: Effort normal.  Abdominal: Soft. There is no tenderness.  Musculoskeletal: Normal range of motion.  Lymphadenopathy:    She has no cervical adenopathy.  Neurological: She is alert and oriented to person, place, and time. She has normal strength. No sensory deficit. Gait normal. GCS eye subscore is 4. GCS verbal subscore is 5. GCS motor subscore is 6.  Normal heel-shin,  normal rapid alternating movements. Cranial nerves III-XII intact.  No pronator drift.  Skin: Skin is warm and dry. No rash noted.  Psychiatric: She has a normal mood and affect. Her speech is normal and behavior is normal. Thought content normal. Cognition and memory are normal.  Nursing note and vitals reviewed.   ED Course  Procedures (including critical care time) Labs Review Labs Reviewed - No data to display  Imaging Review Ct Maxillofacial Wo Cm  01/14/2016  CLINICAL DATA:  Frontal headache and bilateral ear pain x 1 week; rt ear worse than left; no known injury; no other complaints EXAM: CT MAXILLOFACIAL WITHOUT CONTRAST TECHNIQUE: Multidetector CT imaging of the maxillofacial structures was performed. Multiplanar CT image reconstructions were also generated. A small metallic BB was placed on the right temple in order to reliably differentiate right from left. COMPARISON:  None. FINDINGS:  Dependent fluid lies in the left maxillary sinus. There is minor left maxillary sinus mucosal thickening. Ethmoid air cells, sphenoid sinuses and frontal sinuses are clear as is the right maxillary sinus. The mastoid air cells and middle ear cavities are clear. The left ostiomeatal complex is occluded by a combination of mucosal thickening, low-lying ethmoid air cells and a left concha bullosa. There is anatomic narrowing of the right ostiomeatal complex mostly at the ostium there is a small right concha bullosa and minimal right nasal septal deviation. There are no areas of bone resorption or sclerosis. Limited intracranial evaluation is unremarkable. Normal globes and orbits. No soft tissue masses. No adenopathy. IMPRESSION: 1. Left maxillary sinus air-fluid level which may be due to acute sinusitis. 2. Occluded left ostiomeatal complex. Narrowed right ostiomeatal complex. 3. Clear mastoid air cells and middle ear cavities. Remaining sinus cavities are clear. Electronically Signed   By: Amie Portland M.D.    On: 01/14/2016 19:58   I have personally reviewed and evaluated these images and lab results as part of my medical decision-making.   EKG Interpretation None      MDM   Final diagnoses:  Acute maxillary sinusitis, recurrence not specified      Radiological studies were viewed, interpreted and considered during the medical decision making and disposition process. I agree with radiologists reading.  Results were also discussed with patient.  Patient was given migraine cocktail including prednisone by mouth, Compazine and Benadryl IM with headache improvement, but not complete resolution.  She has no focal findings on exam.  CT scan is suggesting left-sided maxillary sinusitis.  Is unclear for the reason for the severe right ear pain, perhaps this is referred pain.  She was encouraged to complete her antibiotics, prescribed hydrocodone for pain relief, cautioned regarding sedation.  Plan follow-up with her PCP for recheck in one week as she is finishing her full antibiotic course.  The patient appears reasonably screened and/or stabilized for discharge and I doubt any other medical condition or other [facility] requiring further screening, evaluation, or treatment in the ED at this time prior to discharge.      Burgess Amor, PA-C 01/14/16 2122  Vanetta Mulders, MD 01/17/16 [phone]

## 2016-01-14 NOTE — ED Notes (Addendum)
Patient complains of headache, dizziness and bilateral ear pain x1 week. States pain getting worse today. States she was seen here and referred to ENT doctor but needed a referral and unable to see PCP. States she was dx with acute sinusitis.

## 2016-01-14 NOTE — Discharge Instructions (Signed)

## 2016-03-07 IMAGING — DX DG CHEST 2V
2 series · 2 of 2 positions shown · non-contrast
Comparison: Chest x-ray of 06/08/2014

CLINICAL DATA: History of pneumonia, nonproductive cough

EXAM:
CHEST  2 VIEW

[chest pa]
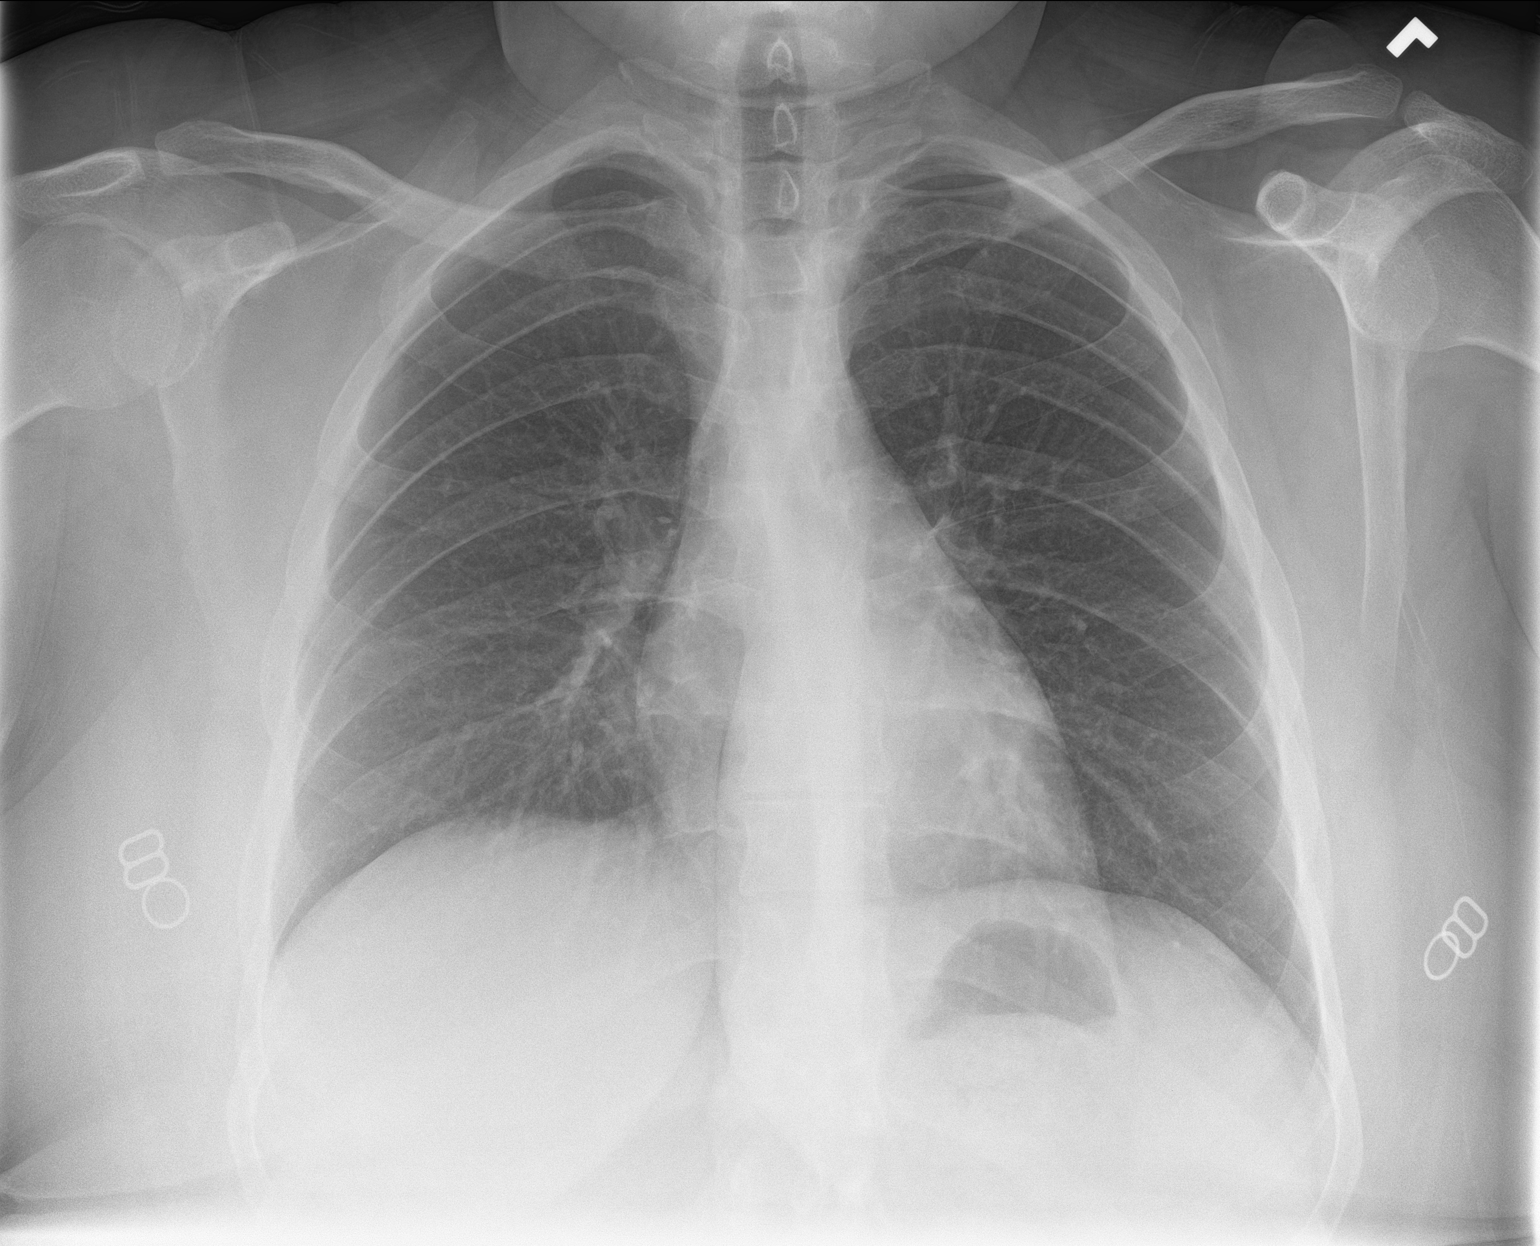

[chest lat]
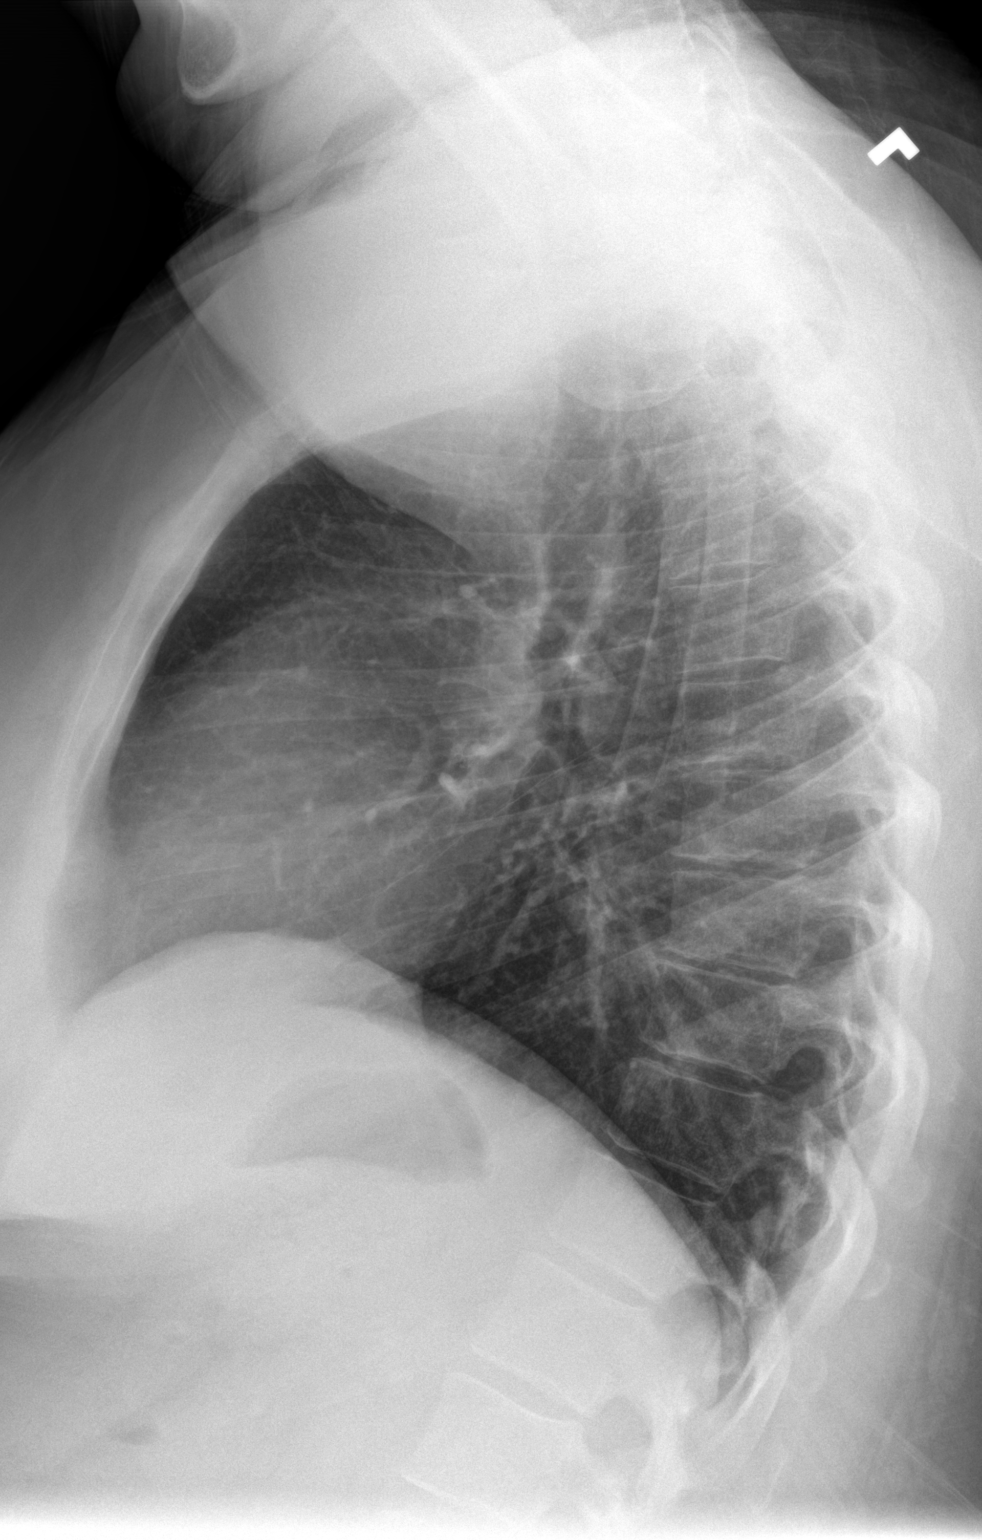

[2 of 2 positions shown; findings below may reference images not displayed]

FINDINGS: No active infiltrate or effusion is seen. Mediastinal and hilar
contours are unremarkable. The heart is within normal limits in
size. No bony abnormality is seen.
IMPRESSION: No active cardiopulmonary disease.

## 2016-03-27 ENCOUNTER — Emergency Department (HOSPITAL_COMMUNITY): Payer: Medicaid Other

## 2016-03-27 ENCOUNTER — Encounter (HOSPITAL_COMMUNITY): Payer: Self-pay | Admitting: Emergency Medicine

## 2016-03-27 ENCOUNTER — Emergency Department (HOSPITAL_COMMUNITY)
Admission: EM | Admit: 2016-03-27 | Discharge: 2016-03-27 | Disposition: A | Discharge status: Home / Self Care | Payer: Medicaid Other | Attending: Emergency Medicine | Admitting: Emergency Medicine

## 2016-03-27 DIAGNOSIS — R102 Pelvic and perineal pain: Secondary | ICD-10-CM | POA: Diagnosis not present

## 2016-03-27 DIAGNOSIS — R1031 Right lower quadrant pain: Secondary | ICD-10-CM | POA: Diagnosis present

## 2016-03-27 DIAGNOSIS — G8929 Other chronic pain: Secondary | ICD-10-CM

## 2016-03-27 DIAGNOSIS — Z79899 Other long term (current) drug therapy: Secondary | ICD-10-CM | POA: Diagnosis not present

## 2016-03-27 DIAGNOSIS — E669 Obesity, unspecified: Secondary | ICD-10-CM | POA: Diagnosis not present

## 2016-03-27 DIAGNOSIS — Z87891 Personal history of nicotine dependence: Secondary | ICD-10-CM | POA: Insufficient documentation

## 2016-03-27 HISTORY — DX: Constipation, unspecified: K59.00

## 2016-03-27 LAB — URINALYSIS, ROUTINE W REFLEX MICROSCOPIC
BILIRUBIN URINE: NEGATIVE
Glucose, UA: NEGATIVE mg/dL
Hgb urine dipstick: NEGATIVE
Ketones, ur: NEGATIVE mg/dL
LEUKOCYTES UA: NEGATIVE
NITRITE: NEGATIVE
PH: 5.5 (ref 5.0–8.0)
Protein, ur: NEGATIVE mg/dL
SPECIFIC GRAVITY, URINE: 1.025 (ref 1.005–1.030)

## 2016-03-27 LAB — COMPREHENSIVE METABOLIC PANEL
ALBUMIN: 3.7 g/dL (ref 3.5–5.0)
ALK PHOS: 99 U/L (ref 38–126)
ALT: 17 U/L (ref 14–54)
ANION GAP: 3 — AB (ref 5–15)
AST: 29 U/L (ref 15–41)
BILIRUBIN TOTAL: 0.2 mg/dL — AB (ref 0.3–1.2)
BUN: 11 mg/dL (ref 6–20)
CALCIUM: 8.8 mg/dL — AB (ref 8.9–10.3)
CO2: 24 mmol/L (ref 22–32)
Chloride: 111 mmol/L (ref 101–111)
Creatinine, Ser: 0.86 mg/dL (ref 0.44–1.00)
GLUCOSE: 101 mg/dL — AB (ref 65–99)
POTASSIUM: 3.8 mmol/L (ref 3.5–5.1)
SODIUM: 138 mmol/L (ref 135–145)
TOTAL PROTEIN: 7.1 g/dL (ref 6.5–8.1)

## 2016-03-27 LAB — CBC
HEMATOCRIT: 37.1 % (ref 36.0–46.0)
HEMOGLOBIN: 12.3 g/dL (ref 12.0–15.0)
MCH: 28.9 pg (ref 26.0–34.0)
MCHC: 33.2 g/dL (ref 30.0–36.0)
MCV: 87.1 fL (ref 78.0–100.0)
Platelets: 242 10*3/uL (ref 150–400)
RBC: 4.26 MIL/uL (ref 3.87–5.11)
RDW: 13.8 % (ref 11.5–15.5)
WBC: 8.5 10*3/uL (ref 4.0–10.5)

## 2016-03-27 LAB — PREGNANCY, URINE: Preg Test, Ur: NEGATIVE

## 2016-03-27 LAB — WET PREP, GENITAL
CLUE CELLS WET PREP: NONE SEEN
SPERM: NONE SEEN
TRICH WET PREP: NONE SEEN
YEAST WET PREP: NONE SEEN

## 2016-03-27 LAB — LIPASE, BLOOD: Lipase: 38 U/L (ref 11–51)

## 2016-03-27 MED ORDER — NAPROXEN 250 MG PO TABS
250.0000 mg | ORAL_TABLET | Freq: Two times a day (BID) | ORAL | Status: DC | PRN
Start: 2016-03-27 — End: 2016-03-30

## 2016-03-27 MED ORDER — KETOROLAC TROMETHAMINE 60 MG/2ML IM SOLN
60.0000 mg | Freq: Once | INTRAMUSCULAR | Status: AC
  Administered 2016-03-27: 60 mg via INTRAMUSCULAR
  Filled 2016-03-27: qty 2

## 2016-03-27 MED ORDER — OXYCODONE-ACETAMINOPHEN 5-325 MG PO TABS
1.0000 | ORAL_TABLET | Freq: Once | ORAL | Status: AC
  Administered 2016-03-27: 1 via ORAL
  Filled 2016-03-27: qty 1

## 2016-03-27 MED ORDER — ONDANSETRON 8 MG PO TBDP
8.0000 mg | ORAL_TABLET | Freq: Once | ORAL | Status: AC
  Administered 2016-03-27: 8 mg via ORAL
  Filled 2016-03-27: qty 1

## 2016-03-27 NOTE — ED Notes (Addendum)
Pt in US. Family in room, deny needs at present.

## 2016-03-27 NOTE — ED Notes (Addendum)
Pt states she has been having right lower quad pain for 2 days with no other symptoms.  States feels like ovarian cyst from past.

## 2016-03-27 NOTE — Discharge Instructions (Signed)
Take your usual prescriptions as previously directed.   Begin to take over the counter stool softener (colace), as directed on packaging, for the next month. Call your regular medical doctor and your GI doctor on Monday to schedule a follow up appointment next week.  Return to the Emergency Department immediately if worsening.

## 2016-03-27 NOTE — ED Provider Notes (Signed)
CSN: [id number]     Arrival date & time 03/27/16  1525 History   First MD Initiated Contact with Patient 03/27/16 1532     Chief Complaint  Patient presents with  . Abdominal Pain     HPI Pt was seen at 1540.  Per pt, c/o gradual onset and persistence of constant acute flair of her chronic right sided pelvic "pain" for the past 2 days.  Has been associated no other symptoms.  Describes the abd pain as "aching" and "like the last time I had an ovarian cyst."  Denies N/V, no diarrhea, no fevers, no back pain, no rash, no CP/SOB, no black or blood in stools, no dysuria/hematuria, no vaginal bleeding/discharge.       Past Medical History  Diagnosis Date  . Herpes   . GERD (gastroesophageal reflux disease)   . Gastritis   . Disk prolapse   . Bulging disc   . Obesity   . Mental disorder     anxiety  . Other and unspecified ovarian cyst 12/01/2013  . Elevated liver enzymes 12/01/2013  . Ovarian cyst, right   . Headache   . Abdominal pain, chronic, right lower quadrant   . Abdominal pain, chronic, epigastric   . Contraceptive management 07/18/2014  . Constipation    Past Surgical History  Procedure Laterality Date  . Wisdom tooth extraction    . Esophagogastroduodenoscopy  01/19/2012    WUJ:WJXBJY,NWGNFAOZ IN THE ANTRUM/HIATAL HERNIA/  . Colonoscopy with esophagogastroduodenoscopy (egd) N/A 02/24/2013    HYQ:MVHQIONGEXBM RECTAL BLEEDING DUE TO Moderate sized internal hemorrhoids, EGD: erosive esophagitis due to uncontrolled reflux, moderate erosive gastritis, benign path   Family History  Problem Relation Age of Onset  . GER disease Mother   . Other Mother     diverticulitis; hernia  . Diabetes Mother     borderline  . Diabetes Other   . Colon cancer Neg Hx   . Colon polyps Neg Hx   . Diabetes Maternal Grandmother   . COPD Maternal Grandmother   . Diabetes Maternal Grandfather   . Congestive Heart Failure Maternal Grandfather   . Headache Son    Social History  Substance Use  Topics  . Smoking status: Former Smoker -- 0.25 packs/day for .5 years    Types: Cigarettes    Quit date: 07/28/2011  . Smokeless tobacco: Never Used  . Alcohol Use: No   OB History    Gravida Para Term Preterm AB TAB SAB Ectopic Multiple Living   1 1 1       1      Review of Systems ROS: Statement: All systems negative except as marked or noted in the HPI; Constitutional: Negative for fever and chills. ; ; Eyes: Negative for eye pain, redness and discharge. ; ; ENMT: Negative for ear pain, hoarseness, nasal congestion, sinus pressure and sore throat. ; ; Cardiovascular: Negative for chest pain, palpitations, diaphoresis, dyspnea and peripheral edema. ; ; Respiratory: Negative for cough, wheezing and stridor. ; ; Gastrointestinal: Negative for nausea, vomiting, diarrhea, abdominal pain, blood in stool, hematemesis, jaundice and rectal bleeding. . ; ; Genitourinary: Negative for dysuria, flank pain and hematuria. ; ; GYN:  +pelvic pain, no vaginal bleeding, no vaginal discharge, no vulvar pain. ;; Musculoskeletal: Negative for back pain and neck pain. Negative for swelling and trauma.; ; Skin: Negative for pruritus, rash, abrasions, blisters, bruising and skin lesion.; ; Neuro: Negative for headache, lightheadedness and neck stiffness. Negative for weakness, altered level of consciousness, altered mental status,  extremity weakness, paresthesias, involuntary movement, seizure and syncope.      Allergies  Review of patient's allergies indicates no known allergies.  Home Medications   Prior to Admission medications   Medication Sig Start Date End Date Taking? Authorizing Provider  amoxicillin (AMOXIL) 500 MG capsule Take 1 capsule (500 mg total) by mouth 3 (three) times daily. 01/12/16   Ivery QualeHobson Bryant, PA-C  diclofenac (VOLTAREN) 50 MG EC tablet Take 50 mg by mouth 2 (two) times daily. 07/29/15   Historical Provider, MD  DULoxetine (CYMBALTA) 60 MG capsule Take 60 mg by mouth at bedtime.     Historical Provider, MD  HYDROcodone-acetaminophen (NORCO/VICODIN) 5-325 MG tablet Take 1 tablet by mouth every 4 (four) hours as needed for moderate pain. 01/14/16   Burgess AmorJulie Idol, PA-C  LYRICA 150 MG capsule Take 150 mg by mouth 2 (two) times daily. 07/29/15   Historical Provider, MD  medroxyPROGESTERone (DEPO-PROVERA) 150 MG/ML injection Inject 1 mL (150 mg total) into the muscle every 3 (three) months. 07/25/15   Adline PotterJennifer A Griffin, NP  pantoprazole (PROTONIX) 40 MG tablet Take 1 tablet (40 mg total) by mouth 2 (two) times daily before a meal. 01/01/16   Anice PaganiniEric A Gill, NP  tizanidine (ZANAFLEX) 2 MG capsule Take 2 mg by mouth every 8 (eight) hours as needed. Max of 3 daily 07/02/15   Historical Provider, MD  topiramate (TOPAMAX) 100 MG tablet Take 300 mg by mouth daily. 07/29/15   Historical Provider, MD  traMADol (ULTRAM) 50 MG tablet Take 1 tablet (50 mg total) by mouth every 6 (six) hours as needed. 08/13/15   Gilda Creasehristopher J Pollina, MD  valACYclovir (VALTREX) 1000 MG tablet Take 1 tablet (1,000 mg total) by mouth daily. 07/25/15   Adline PotterJennifer A Griffin, NP   BP 112/63 mmHg  Pulse 93  Temp(Src) 98.5 F (36.9 C) (Oral)  Resp 16  Ht 5\' 6"  (1.676 m)  Wt 305 lb (138.347 kg)  BMI 49.25 kg/m2  SpO2 100%  LMP 02/26/2016 Physical Exam  1545: Physical examination:  Nursing notes reviewed; Vital signs and O2 SAT reviewed;  Constitutional: Well developed, Well nourished, Well hydrated, In no acute distress; Head:  Normocephalic, atraumatic; Eyes: EOMI, PERRL, No scleral icterus; ENMT: Mouth and pharynx normal, Mucous membranes moist; Neck: Supple, Full range of motion, No lymphadenopathy; Cardiovascular: Regular rate and rhythm, No murmur, rub, or gallop; Respiratory: Breath sounds clear & equal bilaterally, No rales, rhonchi, wheezes.  Speaking full sentences with ease, Normal respiratory effort/excursion; Chest: Nontender, Movement normal; Abdomen: Soft, +right pelvic and suprapubic tenderness to palp. No rebound  or guarding. Nondistended, Normal bowel sounds; Genitourinary: No CVA tenderness. Pelvic exam performed with permission of pt and female ED tech assist during exam.  External genitalia w/o lesions. Vaginal vault with thin clear discharge.  Cervix w/o lesions, not friable, GC/chlam and wet prep obtained and sent to lab.  Bimanual exam w/o CMT and left pelvic tenderness. +suprapubic and right pelvic tenderness.;; Extremities: Pulses normal, No tenderness, No edema, No calf edema or asymmetry.; Neuro: AA&Ox3, Major CN grossly intact.  Speech clear. No gross focal motor or sensory deficits in extremities. Climbs on and off stretcher easily by herself. Gait steady.; Skin: Color normal, Warm, Dry.     ED Course  Procedures (including critical care time) Labs Review   Imaging Review  I have personally reviewed and evaluated these images and lab results as part of my medical decision-making.   EKG Interpretation None      MDM  MDM Reviewed: previous chart, nursing note and vitals Reviewed previous: labs Interpretation: labs and ultrasound     Results for orders placed or performed during the hospital encounter of 03/27/16  Wet prep, genital  Result Value Ref Range   Yeast Wet Prep HPF POC NONE SEEN NONE SEEN   Trich, Wet Prep NONE SEEN NONE SEEN   Clue Cells Wet Prep HPF POC NONE SEEN NONE SEEN   WBC, Wet Prep HPF POC FEW (A) NONE SEEN   Sperm NONE SEEN   Lipase, blood  Result Value Ref Range   Lipase 38 11 - 51 U/L  Comprehensive metabolic panel  Result Value Ref Range   Sodium 138 135 - 145 mmol/L   Potassium 3.8 3.5 - 5.1 mmol/L   Chloride 111 101 - 111 mmol/L   CO2 24 22 - 32 mmol/L   Glucose, Bld 101 (H) 65 - 99 mg/dL   BUN 11 6 - 20 mg/dL   Creatinine, Ser 1.61 0.44 - 1.00 mg/dL   Calcium 8.8 (L) 8.9 - 10.3 mg/dL   Total Protein 7.1 6.5 - 8.1 g/dL   Albumin 3.7 3.5 - 5.0 g/dL   AST 29 15 - 41 U/L   ALT 17 14 - 54 U/L   Alkaline Phosphatase 99 38 - 126 U/L   Total  Bilirubin 0.2 (L) 0.3 - 1.2 mg/dL   GFR calc non Af Amer >60 >60 mL/min   GFR calc Af Amer >60 >60 mL/min   Anion gap 3 (L) 5 - 15  CBC  Result Value Ref Range   WBC 8.5 4.0 - 10.5 K/uL   RBC 4.26 3.87 - 5.11 MIL/uL   Hemoglobin 12.3 12.0 - 15.0 g/dL   HCT 09.6 04.5 - 40.9 %   MCV 87.1 78.0 - 100.0 fL   MCH 28.9 26.0 - 34.0 pg   MCHC 33.2 30.0 - 36.0 g/dL   RDW 81.1 91.4 - 78.2 %   Platelets 242 150 - 400 K/uL  Urinalysis, Routine w reflex microscopic  Result Value Ref Range   Color, Urine YELLOW YELLOW   APPearance CLEAR CLEAR   Specific Gravity, Urine 1.025 1.005 - 1.030   pH 5.5 5.0 - 8.0   Glucose, UA NEGATIVE NEGATIVE mg/dL   Hgb urine dipstick NEGATIVE NEGATIVE   Bilirubin Urine NEGATIVE NEGATIVE   Ketones, ur NEGATIVE NEGATIVE mg/dL   Protein, ur NEGATIVE NEGATIVE mg/dL   Nitrite NEGATIVE NEGATIVE   Leukocytes, UA NEGATIVE NEGATIVE  Pregnancy, urine  Result Value Ref Range   Preg Test, Ur NEGATIVE NEGATIVE   US Transvaginal Non-ob 03/27/2016  CLINICAL DATA:  Right lower quadrant and pelvic pain for 2 days. Clinical suspicion for ovarian torsion. EXAM: TRANSABDOMINAL AND TRANSVAGINAL ULTRASOUND OF PELVIS DOPPLER ULTRASOUND OF OVARIES TECHNIQUE: Both transabdominal and transvaginal ultrasound examinations of the pelvis were performed. Transabdominal technique was performed for global imaging of the pelvis including uterus, ovaries, adnexal regions, and pelvic cul-de-sac. It was necessary to proceed with endovaginal exam following the transabdominal exam to visualize the endometrium and ovaries. Color and duplex Doppler ultrasound was utilized to evaluate blood flow to the ovaries. COMPARISON:  CT on 11/11/2014 FINDINGS: Uterus Measurements: 8.0 x 2.9 x 4.5 cm. Retroflexed. No fibroids or other mass visualized. Endometrium Thickness: 5 mm.  No focal abnormality visualized. Right ovary Measurements: 2.4 x 1.5 x 2.2 cm. Normal appearance/no adnexal mass. Left ovary Measurements: 2.9  x 1.5 x 2.4 cm. Normal appearance/no adnexal mass. Pulsed Doppler evaluation of both ovaries  demonstrates normal low-resistance arterial and venous waveforms. Other findings No abnormal free fluid. IMPRESSION: Normal appearing retroverted uterus.  No fibroids identified. Normal appearance of both ovaries.  No adnexal mass identified. No sonographic evidence for ovarian torsion. Electronically Signed   By: Myles Rosenthal M.D.   On: 03/27/2016 17:33   Ct Renal Stone Study 03/27/2016  CLINICAL DATA:  Right lower quadrant pain for 2 days EXAM: CT ABDOMEN AND PELVIS WITHOUT CONTRAST TECHNIQUE: Multidetector CT imaging of the abdomen and pelvis was performed following the standard protocol without IV contrast. COMPARISON:  11/11/2014 FINDINGS: Lower chest:  Lung bases are unremarkable. Hepatobiliary: Unenhanced liver shows no biliary ductal dilatation. No calcified gallstones are noted within gallbladder. Pancreas: Unenhanced pancreas is unremarkable. Spleen: Mild splenomegaly.  Spleen measures 13.9 cm in length. Adrenals/Urinary Tract: No adrenal gland mass. Unenhanced kidneys are symmetrical in size. No hydronephrosis or hydroureter. No nephrolithiasis. No calcified ureteral calculi are noted. Bilateral distal ureter is unremarkable. No calcified calculi are noted within under distended urinary bladder. Stomach/Bowel: No gastric outlet obstruction. Study is limited without oral contrast. No small bowel obstruction. No pericecal inflammation. Normal appendix is noted in axial image 63. Moderate stool noted in right colon transverse colon and descending colon. Some colonic stool and gas noted within sigmoid colon. No evidence of colitis or diverticulitis. Vascular/Lymphatic: No aortic aneurysm. No retroperitoneal or mesenteric adenopathy. Reproductive: Unenhanced uterus and ovaries are unremarkable. Minimal retroflexed uterus. No pelvic free fluid. Other: No ascites or free air.  No inguinal adenopathy. Musculoskeletal: No  destructive bony lesions are noted. Sagittal images of the spine are unremarkable. IMPRESSION: 1. No nephrolithiasis.  No hydronephrosis or hydroureter. 2. No calcified ureteral calculi. 3. Normal appendix.  No pericecal inflammation. 4. Moderate colonic stool. No evidence of colitis or diverticulitis. 5. Unremarkable uterus and ovaries. Electronically Signed   By: Natasha Mead M.D.   On: 03/27/2016 20:42    2115:  Workup reassuring. Long hx of chronic pain with multiple ED visits for same. Pt encouraged to f/u with her PMD, GI MD, OB/GYN MD, and Pain Management doctor for good continuity of care and control of her chronic pain.  Pt verb understanding. Dx and testing d/w pt and family.  Questions answered.  Verb understanding, agreeable to d/c home with outpt f/u.    Samuel Jester, DO 03/30/16 [phone]

## 2016-03-27 NOTE — ED Notes (Signed)
POC pregnancy test read at 4:09pm, pt is NOT pregnant.

## 2016-03-27 NOTE — ED Notes (Signed)
Dr. Clarene DukeMcManus informed of pt's request for more pain medication.

## 2016-03-30 ENCOUNTER — Ambulatory Visit (INDEPENDENT_AMBULATORY_CARE_PROVIDER_SITE_OTHER): Payer: Medicaid Other | Admitting: Nurse Practitioner

## 2016-03-30 ENCOUNTER — Encounter: Payer: Self-pay | Admitting: Nurse Practitioner

## 2016-03-30 VITALS — BP 119/75 | HR 105 | Temp 98.3°F | Ht 66.0 in | Wt 316.0 lb

## 2016-03-30 DIAGNOSIS — K5900 Constipation, unspecified: Secondary | ICD-10-CM | POA: Diagnosis not present

## 2016-03-30 DIAGNOSIS — K219 Gastro-esophageal reflux disease without esophagitis: Secondary | ICD-10-CM

## 2016-03-30 LAB — GC/CHLAMYDIA PROBE AMP (~~LOC~~) NOT AT ARMC
CHLAMYDIA, DNA PROBE: NEGATIVE
NEISSERIA GONORRHEA: NEGATIVE

## 2016-03-30 NOTE — Progress Notes (Signed)
Referring Provider: Pearson GrippeKim, James, MD Primary Care Physician:  Pearson GrippeJames Kim, MD Primary GI:  Dr. Darrick PennaFields  Chief Complaint  Patient presents with  . Constipation  . Abdominal Pain    HPI:   [name] is a 30 y.o. female who presents for follow-up on GERD and constipation. She was last seen in our office on 01/01/16 at which point her GERD was well controlled on Protonix 40 mg bid. She describes abdominal pain and associated constipation with a bowel movement 1-2 times a day but with initial stool very hard and subsequent stools looser. She admitted to minimal water and fiber intake. Recommended she increase her water intake, daily fiber supplement, MiraLAX 17 g once a day and call with results in 2-4 weeks. She did not call with results. She was seen in the emergency department 03/27/2016 for acute on chronic right sided pelvic pain for the previous 2 days with no associated symptoms. Labs were essentially normal, transvaginal ultrasound was normal, CT renal stone study without renal stone but noted moderate colonic stool without obstruction or diverticulitis. Colonoscopy and endoscopy are up-to-date last completed 02/24/2013 which found intermittent rectal bleeding due to moderate size internal hemorrhoids and erosive esophagitis due to uncontrolled reflux. Recommended continue Protonix, biopsies normal.  Today she states she was doing well until ER visit last week as per above. She was given an Rx for Naproxen, which she did not get filled because of history of esophagitis. Has not been taking Miralax, but has been taking Dulcolax stool softener. Minimal to no water intake, minimal fiber intake. Has not been taking fiber supplement. She is wanting a prescription medication as she is more likely to take that. Abdominal pain is somewhat improved today, did have "finally" a bowel movement yesterday. Denies hematochezia, melena, GERD symptoms. Denies chest pain, dyspnea, dizziness, lightheadedness,  syncope, near syncope. Denies any other upper or lower GI symptoms.  Past Medical History  Diagnosis Date  . Herpes   . GERD (gastroesophageal reflux disease)   . Gastritis   . Disk prolapse   . Bulging disc   . Obesity   . Mental disorder     anxiety  . Other and unspecified ovarian cyst 12/01/2013  . Elevated liver enzymes 12/01/2013  . Ovarian cyst, right   . Headache   . Abdominal pain, chronic, right lower quadrant   . Abdominal pain, chronic, epigastric   . Contraceptive management 07/18/2014  . Constipation     Past Surgical History  Procedure Laterality Date  . Wisdom tooth extraction    . Esophagogastroduodenoscopy  01/19/2012    ZOX:WRUEAV,[id number]:ULCERS,MULTIPLE IN THE ANTRUM/HIATAL HERNIA/  . Colonoscopy with esophagogastroduodenoscopy (egd) N/A 02/24/2013    YNW:[id number]:INTERMITTENT RECTAL BLEEDING DUE TO Moderate sized internal hemorrhoids, EGD: erosive esophagitis due to uncontrolled reflux, moderate erosive gastritis, benign path    Current Outpatient Prescriptions  Medication Sig Dispense Refill  . DULoxetine (CYMBALTA) 60 MG capsule Take 60 mg by mouth at bedtime.    Marland Kitchen. LYRICA 150 MG capsule Take 150 mg by mouth 2 (two) times daily.  2  . medroxyPROGESTERone (DEPO-PROVERA) 150 MG/ML injection Inject 1 mL (150 mg total) into the muscle every 3 (three) months. 1 mL 4  . pantoprazole (PROTONIX) 40 MG tablet Take 1 tablet (40 mg total) by mouth 2 (two) times daily before a meal. 60 tablet 11  . tizanidine (ZANAFLEX) 2 MG capsule Take 2 mg by mouth every 8 (eight) hours as needed. Max of 3 daily  2  .  topiramate (TOPAMAX) 100 MG tablet Take 300 mg by mouth daily.  2  . valACYclovir (VALTREX) 1000 MG tablet Take 1 tablet (1,000 mg total) by mouth daily. 30 tablet 11   No current facility-administered medications for this visit.    Allergies as of 03/30/2016  . (No Known Allergies)    Family History  Problem Relation Age of Onset  . GER disease Mother   . Other Mother      diverticulitis; hernia  . Diabetes Mother     borderline  . Diabetes Other   . Colon cancer Neg Hx   . Colon polyps Neg Hx   . Diabetes Maternal Grandmother   . COPD Maternal Grandmother   . Diabetes Maternal Grandfather   . Congestive Heart Failure Maternal Grandfather   . Headache Son     Social History   Social History  . Marital Status: Single    Spouse Name: N/A  . Number of Children: 1  . Years of Education: N/A   Occupational History  .    [employer] Unemployed    Social History Main Topics  . Smoking status: Former Smoker -- 0.25 packs/day for .5 years    Types: Cigarettes    Quit date: 07/28/2011  . Smokeless tobacco: Never Used  . Alcohol Use: No  . Drug Use: No  . Sexual Activity: Yes    Birth Control/ Protection: Injection   Other Topics Concern  . None   Social History Narrative    Review of Systems: 10-point ROS negative except as per HPI.   Physical Exam: BP 119/75 mmHg  Pulse 105  Temp(Src) 98.3 F (36.8 C) (Oral)  Ht  (1.676 m)  Wt 316 lb (143.337 kg)  BMI 51.03 kg/m2  LMP 02/26/2016 General:   Morbidly obese female, alert and oriented. Pleasant and cooperative. Well-nourished and well-developed.  Head:  Normocephalic and atraumatic. Eyes:  Without icterus, sclera clear and conjunctiva pink.  Ears:  Normal auditory acuity. Cardiovascular:  S1, S2 present without murmurs appreciated.Extremities without clubbing or edema. Respiratory:  Clear to auscultation bilaterally. No wheezes, rales, or rhonchi. No distress.  Gastrointestinal:  +BS, soft, and non-distended. Mild generalized TTP. No HSM noted. No guarding or rebound. No masses appreciated.  Rectal:  Deferred  Musculoskalatal:  Symmetrical without gross deformities. Neurologic:  Alert and oriented x4;  grossly normal neurologically. Psych:  Alert and cooperative. Normal mood and affect. Heme/Lymph/Immune: No excessive bruising noted.    03/30/2016 9:36 AM   Disclaimer: This note was  dictated with voice recognition software. Similar sounding words can inadvertently be transcribed and may not be corrected upon review.

## 2016-03-30 NOTE — Patient Instructions (Signed)
1. Increase her fiber and water intake. 2. He can start taking daily fiber supplements to help. 3. I will start you on Linzess 45 g a day. 4. Call us in 2 weeks and let us know how it is working. 5. Return for follow-up in 3 months.

## 2016-03-30 NOTE — Progress Notes (Signed)
cc'ed to pcp °

## 2016-03-30 NOTE — Assessment & Plan Note (Signed)
Well-controlled, continue to monitor, continue Protonix. Avoid NSAIDs and ASA powders.

## 2016-03-30 NOTE — Assessment & Plan Note (Signed)
Continued constipation is likely cause of her abdominal pain. Labs and imaging in the emergency room with no acute process but demonstrates moderate stool burden throughout the colon. Has not followed through on any of the recommendations previously. Today when asked bluntly, she states she prefers a prescription medication that she is likely to adhere to this. Continue to recommend fiber and water including fiber supplement. We will start her on Linzess 145 g have her return for follow-up in 3 months. She is to call us in 2 weeks with progress report on medication.

## 2016-04-03 ENCOUNTER — Ambulatory Visit (INDEPENDENT_AMBULATORY_CARE_PROVIDER_SITE_OTHER): Payer: Medicaid Other | Admitting: *Deleted

## 2016-04-03 ENCOUNTER — Encounter: Payer: Self-pay | Admitting: *Deleted

## 2016-04-03 DIAGNOSIS — Z3042 Encounter for surveillance of injectable contraceptive: Secondary | ICD-10-CM

## 2016-04-03 DIAGNOSIS — Z3202 Encounter for pregnancy test, result negative: Secondary | ICD-10-CM | POA: Diagnosis not present

## 2016-04-03 LAB — POCT URINE PREGNANCY: PREG TEST UR: NEGATIVE

## 2016-04-03 MED ORDER — MEDROXYPROGESTERONE ACETATE 150 MG/ML IM SUSP
150.0000 mg | Freq: Once | INTRAMUSCULAR | Status: AC
  Administered 2016-04-03: 150 mg via INTRAMUSCULAR

## 2016-04-03 NOTE — Progress Notes (Signed)
Patient ID: [name], female   DOB: [birth year], 30 y.o.   MRN: [id number] Depo Provera 150 mg IM given in right deltoid with no complications, pregnancy test negative. Pt to return in 12 weeks for next injection.

## 2016-04-07 ENCOUNTER — Telehealth: Payer: Self-pay | Admitting: Gastroenterology

## 2016-04-07 NOTE — Telephone Encounter (Signed)
Pt called today to say that she had seen EG last week and was put on Linzess. The Linzess is giving her diarrhea and wants to know if he can put her on something else. Please advise.

## 2016-04-07 NOTE — Telephone Encounter (Signed)
Pt called back to ask and I told her I had not heard [place] from [place], but to hold the Linzess until I hear from him tomorrow.

## 2016-04-07 NOTE — Telephone Encounter (Signed)
Pt called again at 416pm asking to speak with DS to see if she had heard back from EG. I told her that DS was not in her office at the moment and I would let her know that she had called back. Call transferred to VM

## 2016-04-07 NOTE — Telephone Encounter (Signed)
I spoke to pt. She said she started the Linzess last Tuesday and started diarrhea and she kept taking the Linzess. I told her to hold off right now until I let [place], know and see what he recommends.

## 2016-04-07 NOTE — Telephone Encounter (Signed)
See previous note of today.

## 2016-04-08 NOTE — Telephone Encounter (Signed)
We can try the 72 mcg dose of Linzess, gie it another 1-2 weeks and see if the diarrhea improves. The 145 mcg dose may be too much for her. She can come pick up samples from hour office.

## 2016-04-08 NOTE — Telephone Encounter (Signed)
Pt is aware #12 tablets of the Linzess 72 mcg at front for pick up.

## 2016-04-27 ENCOUNTER — Encounter (HOSPITAL_COMMUNITY): Payer: Self-pay | Admitting: Emergency Medicine

## 2016-04-27 ENCOUNTER — Emergency Department (HOSPITAL_COMMUNITY)
Admission: EM | Admit: 2016-04-27 | Discharge: 2016-04-27 | Disposition: A | Discharge status: Home / Self Care | Payer: Medicaid Other | Attending: Emergency Medicine | Admitting: Emergency Medicine

## 2016-04-27 DIAGNOSIS — E669 Obesity, unspecified: Secondary | ICD-10-CM | POA: Diagnosis not present

## 2016-04-27 DIAGNOSIS — Z6841 Body Mass Index (BMI) 40.0 and over, adult: Secondary | ICD-10-CM | POA: Insufficient documentation

## 2016-04-27 DIAGNOSIS — M722 Plantar fascial fibromatosis: Secondary | ICD-10-CM | POA: Diagnosis not present

## 2016-04-27 DIAGNOSIS — M79671 Pain in right foot: Secondary | ICD-10-CM | POA: Diagnosis present

## 2016-04-27 DIAGNOSIS — Z87891 Personal history of nicotine dependence: Secondary | ICD-10-CM | POA: Diagnosis not present

## 2016-04-27 MED ORDER — DICLOFENAC SODIUM 75 MG PO TBEC: 75.0000 mg | DELAYED_RELEASE_TABLET | Freq: Two times a day (BID) | ORAL | Status: DC

## 2016-04-27 NOTE — Discharge Instructions (Signed)
Plantar Fasciitis Plantar fasciitis is a painful foot condition that affects the heel. It occurs when the band of tissue that connects the toes to the heel bone (plantar fascia) becomes irritated. This can happen after exercising too much or doing other repetitive activities (overuse injury). The pain from plantar fasciitis can range from mild irritation to severe pain that makes it difficult for you to walk or move. The pain is usually worse in the morning or after you have been sitting or lying down for a while. CAUSES This condition may be caused by:  Standing for long periods of time.  Wearing shoes that do not fit.  Doing high-impact activities, including running, aerobics, and ballet.  Being overweight.  Having an abnormal way of walking (gait).  Having tight calf muscles.  Having high arches in your feet.  Starting a new athletic activity. SYMPTOMS The main symptom of this condition is heel pain. Other symptoms include:  Pain that gets worse after activity or exercise.  Pain that is worse in the morning or after resting.  Pain that goes away after you walk for a few minutes. DIAGNOSIS This condition may be diagnosed based on your signs and symptoms. Your health care provider will also do a physical exam to check for:  A tender area on the bottom of your foot.  A high arch in your foot.  Pain when you move your foot.  Difficulty moving your foot. You may also need to have imaging studies to confirm the diagnosis. These can include:  X-rays.  Ultrasound.  MRI. TREATMENT  Treatment for plantar fasciitis depends on the severity of the condition. Your treatment may include:  Rest, ice, and over-the-counter pain medicines to manage your pain.  Exercises to stretch your calves and your plantar fascia.  A splint that holds your foot in a stretched, upward position while you sleep (night splint).  Physical therapy to relieve symptoms and prevent problems in the  future.  Cortisone injections to relieve severe pain.  Extracorporeal shock wave therapy (ESWT) to stimulate damaged plantar fascia with electrical impulses. It is often used as a last resort before surgery.  Surgery, if other treatments have not worked after 12 months. HOME CARE INSTRUCTIONS  Take medicines only as directed by your health care provider.  Avoid activities that cause pain.  Roll the bottom of your foot over a bag of ice or a bottle of cold water. Do this for 20 minutes, 3-4 times a day.  Perform simple stretches as directed by your health care provider.  Try wearing athletic shoes with air-sole or gel-sole cushions or soft shoe inserts.  Wear a night splint while sleeping, if directed by your health care provider.  Keep all follow-up appointments with your health care provider. PREVENTION   Do not perform exercises or activities that cause heel pain.  Consider finding low-impact activities if you continue to have problems.  Lose weight if you need to. The best way to prevent plantar fasciitis is to avoid the activities that aggravate your plantar fascia. SEEK MEDICAL CARE IF:  Your symptoms do not go away after treatment with home care measures.  Your pain gets worse.  Your pain affects your ability to move or do your daily activities.   This information is not intended to replace advice given to you by your health care provider. Make sure you discuss any questions you have with your health care provider.   Document Released: 07/28/2001 Document Revised: 07/24/2015 Document Reviewed: 09/12/2014 Elsevier   Interactive Patient Education 2016 [employer] Inc.  

## 2016-04-27 NOTE — ED Notes (Signed)
Pt states she has had burning in her right heel for over 2 weeks when walking.

## 2016-04-27 NOTE — ED Notes (Signed)
Patient verbalizes understanding of discharge instructions, prescriptions, home care and follow up care. Patient out of department at this time. 

## 2016-04-27 NOTE — ED Provider Notes (Signed)
CSN: [id number]     Arrival date & time 04/27/16  1556 History  By signing my name below, I, Ronney LionSuzanne Le, attest that this documentation has been prepared under the direction and in the presence of Angello Chien, PA-C. Electronically Signed: Ronney LionSuzanne Le, ED Scribe. 04/27/2016. 4:31 PM.    Chief Complaint  Patient presents with  . Foot Pain   Patient is a 30 y.o. female presenting with lower extremity pain. The history is provided by the patient. No language interpreter was used.  Foot Pain This is a new problem. The current episode started more than 1 week ago. The problem occurs constantly. The problem has been gradually worsening. Pertinent negatives include no chest pain, no abdominal pain, no headaches and no shortness of breath. The symptoms are aggravated by walking. Nothing relieves the symptoms. She has tried nothing for the symptoms.    HPI Comments: [name] is a 30 y.o. female with a history of obesity and anxiety, who presents to the Emergency Department complaining of constant, 10/10, burning right heel pain that began over 2 weeks ago. She notes some pain over the arch of her foot but denies any pain in her right leg or toes. Patient states she does wear sandals. She denies any falls, injury, redness, open sores, or barefoot walking.   Past Medical History  Diagnosis Date  . Herpes   . GERD (gastroesophageal reflux disease)   . Gastritis   . Disk prolapse   . Bulging disc   . Obesity   . Mental disorder     anxiety  . Other and unspecified ovarian cyst 12/01/2013  . Elevated liver enzymes 12/01/2013  . Ovarian cyst, right   . Headache   . Abdominal pain, chronic, right lower quadrant   . Abdominal pain, chronic, epigastric   . Contraceptive management 07/18/2014  . Constipation    Past Surgical History  Procedure Laterality Date  . Wisdom tooth extraction    . Esophagogastroduodenoscopy  01/19/2012    WUJ:WJXBJY,[id number]:ULCERS,MULTIPLE IN THE ANTRUM/HIATAL HERNIA/  . Colonoscopy  with esophagogastroduodenoscopy (egd) N/A 02/24/2013    HYQ:[id number]:INTERMITTENT RECTAL BLEEDING DUE TO Moderate sized internal hemorrhoids, EGD: erosive esophagitis due to uncontrolled reflux, moderate erosive gastritis, benign path   Family History  Problem Relation Age of Onset  . GER disease Mother   . Other Mother     diverticulitis; hernia  . Diabetes Mother     borderline  . Diabetes Other   . Colon cancer Neg Hx   . Colon polyps Neg Hx   . Diabetes Maternal Grandmother   . COPD Maternal Grandmother   . Diabetes Maternal Grandfather   . Congestive Heart Failure Maternal Grandfather   . Headache Son    Social History  Substance Use Topics  . Smoking status: Former Smoker -- 0.25 packs/day for .5 years    Types: Cigarettes    Quit date: 07/28/2011  . Smokeless tobacco: Never Used  . Alcohol Use: No   OB History    Gravida Para Term Preterm AB TAB SAB Ectopic Multiple Living   1 1 1       1      Review of Systems  Respiratory: Negative for shortness of breath.   Cardiovascular: Negative for chest pain.  Gastrointestinal: Negative for abdominal pain.  Musculoskeletal: Positive for myalgias (right foot pain).  Neurological: Negative for headaches.    Allergies  Review of patient's allergies indicates no known allergies.  Home Medications   Prior to Admission medications  Medication Sig Start Date End Date Taking? Authorizing Provider  DULoxetine (CYMBALTA) 60 MG capsule Take 60 mg by mouth at bedtime.    Historical Provider, MD  FIBER, CORN DEXTRIN, PO Take 1 tablet by mouth daily.    Historical Provider, MD  linaclotide (LINZESS) 145 MCG CAPS capsule Take 145 mcg by mouth daily before breakfast.    Historical Provider, MD  LYRICA 150 MG capsule Take 150 mg by mouth 2 (two) times daily. 07/29/15   Historical Provider, MD  medroxyPROGESTERone (DEPO-PROVERA) 150 MG/ML injection Inject 1 mL (150 mg total) into the muscle every 3 (three) months. 07/25/15   Adline Potter, NP   pantoprazole (PROTONIX) 40 MG tablet Take 1 tablet (40 mg total) by mouth 2 (two) times daily before a meal. 01/01/16   Anice Paganini, NP  tizanidine (ZANAFLEX) 2 MG capsule Take 2 mg by mouth every 8 (eight) hours as needed. Max of 3 daily 07/02/15   Historical Provider, MD  topiramate (TOPAMAX) 100 MG tablet Take 300 mg by mouth daily. 07/29/15   Historical Provider, MD  valACYclovir (VALTREX) 1000 MG tablet Take 1 tablet (1,000 mg total) by mouth daily. 07/25/15   Adline Potter, NP   BP 145/86 mmHg  Pulse 97  Temp(Src) 98.1 F (36.7 C) (Oral)  Resp 18  Ht  (1.651 m)  Wt 318 lb (144.244 kg)  BMI 52.92 kg/m2  SpO2 100%  LMP 04/13/2016 Physical Exam  Constitutional: She is oriented to person, place, and time. She appears well-developed and well-nourished. No distress.  HENT:  Head: Normocephalic and atraumatic.  Neck: Neck supple. No tracheal deviation present.  Cardiovascular: Normal rate and intact distal pulses.   Pulmonary/Chest: Effort normal. No respiratory distress.  Musculoskeletal: Normal range of motion. She exhibits tenderness.  Diffuse tenderness of mediolateral right hind foot. No open wound or erythema. No tenderness proximal to the foot.  Sensation and strength intact.   Neurological: She is alert and oriented to person, place, and time.  Skin: Skin is warm and dry.  Psychiatric: She has a normal mood and affect. Her behavior is normal.  Nursing note and vitals reviewed.   ED Course  Procedures (including critical care time)  DIAGNOSTIC STUDIES: Oxygen Saturation is 100% on RA, normal by my interpretation.    COORDINATION OF CARE: 4:27 PM - Suspect plantar fasciitis. Discussed treatment plan with pt at bedside which includes placement in post-op shoe. Advised pt to roll her foot on a cold bottle water to reduce swelling and stretch fascia. Also advised pt to try to wear shoes with good support. Will provide referral to podiatrist for pt to use as needed. Pt  verbalized understanding and agreed to plan.   MDM   Final diagnoses:  Plantar fasciitis of right foot   Pt well appearing and ambulatory.  Gait steady.  No concerning sx's for infectious process.  Agrees to symptomatic tx and podiatry f/u  I personally performed the services described in this documentation, which was scribed in my presence. The recorded information has been reviewed and is accurate.     Pauline Aus, PA-C 04/27/16 1646  Benjiman Core, MD 04/28/16 [phone]

## 2016-05-05 ENCOUNTER — Emergency Department (HOSPITAL_COMMUNITY)
Admission: EM | Admit: 2016-05-05 | Discharge: 2016-05-05 | Disposition: A | Discharge status: Home / Self Care | Payer: Medicaid Other | Attending: Emergency Medicine | Admitting: Emergency Medicine

## 2016-05-05 ENCOUNTER — Encounter (HOSPITAL_COMMUNITY): Payer: Self-pay | Admitting: Emergency Medicine

## 2016-05-05 ENCOUNTER — Emergency Department (HOSPITAL_COMMUNITY): Payer: Medicaid Other

## 2016-05-05 DIAGNOSIS — Z79899 Other long term (current) drug therapy: Secondary | ICD-10-CM | POA: Insufficient documentation

## 2016-05-05 DIAGNOSIS — R51 Headache: Secondary | ICD-10-CM | POA: Insufficient documentation

## 2016-05-05 DIAGNOSIS — Z87891 Personal history of nicotine dependence: Secondary | ICD-10-CM | POA: Insufficient documentation

## 2016-05-05 DIAGNOSIS — M549 Dorsalgia, unspecified: Secondary | ICD-10-CM | POA: Insufficient documentation

## 2016-05-05 DIAGNOSIS — E669 Obesity, unspecified: Secondary | ICD-10-CM | POA: Diagnosis not present

## 2016-05-05 DIAGNOSIS — Z6841 Body Mass Index (BMI) 40.0 and over, adult: Secondary | ICD-10-CM | POA: Insufficient documentation

## 2016-05-05 DIAGNOSIS — M79671 Pain in right foot: Secondary | ICD-10-CM | POA: Diagnosis present

## 2016-05-05 DIAGNOSIS — M722 Plantar fascial fibromatosis: Secondary | ICD-10-CM | POA: Diagnosis not present

## 2016-05-05 MED ORDER — DEXAMETHASONE 4 MG PO TABS: 4.0000 mg | ORAL_TABLET | Freq: Two times a day (BID) | ORAL | Status: DC

## 2016-05-05 MED ORDER — TRAMADOL HCL 50 MG PO TABS: 50.0000 mg | ORAL_TABLET | Freq: Four times a day (QID) | ORAL | Status: DC | PRN

## 2016-05-05 MED ORDER — ONDANSETRON HCL 4 MG PO TABS
4.0000 mg | ORAL_TABLET | Freq: Once | ORAL | Status: AC
  Administered 2016-05-05: 4 mg via ORAL
  Filled 2016-05-05: qty 1

## 2016-05-05 MED ORDER — TRAMADOL HCL 50 MG PO TABS
100.0000 mg | ORAL_TABLET | Freq: Once | ORAL | Status: AC
  Administered 2016-05-05: 100 mg via ORAL
  Filled 2016-05-05: qty 2

## 2016-05-05 MED ORDER — DEXAMETHASONE SODIUM PHOSPHATE 4 MG/ML IJ SOLN
8.0000 mg | Freq: Once | INTRAMUSCULAR | Status: AC
  Administered 2016-05-05: 8 mg via INTRAMUSCULAR
  Filled 2016-05-05: qty 2

## 2016-05-05 NOTE — ED Provider Notes (Signed)
CSN: [id number]     Arrival date & time 05/05/16  1138 History   First MD Initiated Contact with Patient 05/05/16 1147     Chief Complaint  Patient presents with  . Foot Pain     (Consider location/radiation/quality/duration/timing/severity/associated sxs/prior Treatment) Patient is a 30 y.o. female presenting with lower extremity pain. The history is provided by the patient.  Foot Pain This is a recurrent problem. The current episode started 1 to 4 weeks ago. The problem occurs intermittently. The problem has been gradually worsening. Associated symptoms include headaches. Pertinent negatives include no chills, fever or numbness. The symptoms are aggravated by walking and standing. She has tried NSAIDs for the symptoms. The treatment provided moderate relief.    Past Medical History  Diagnosis Date  . Herpes   . GERD (gastroesophageal reflux disease)   . Gastritis   . Disk prolapse   . Bulging disc   . Obesity   . Mental disorder     anxiety  . Other and unspecified ovarian cyst 12/01/2013  . Elevated liver enzymes 12/01/2013  . Ovarian cyst, right   . Headache   . Abdominal pain, chronic, right lower quadrant   . Abdominal pain, chronic, epigastric   . Contraceptive management 07/18/2014  . Constipation    Past Surgical History  Procedure Laterality Date  . Wisdom tooth extraction    . Esophagogastroduodenoscopy  01/19/2012    ZHY:[id number],ONGEXBMW IN THE ANTRUM/HIATAL HERNIA/  . Colonoscopy with esophagogastroduodenoscopy (egd) N/A 02/24/2013    [id number] RECTAL BLEEDING DUE TO Moderate sized internal hemorrhoids, EGD: erosive esophagitis due to uncontrolled reflux, moderate erosive gastritis, benign path   Family History  Problem Relation Age of Onset  . GER disease Mother   . Other Mother     diverticulitis; hernia  . Diabetes Mother     borderline  . Diabetes Other   . Colon cancer Neg Hx   . Colon polyps Neg Hx   . Diabetes Maternal Grandmother   . COPD  Maternal Grandmother   . Diabetes Maternal Grandfather   . Congestive Heart Failure Maternal Grandfather   . Headache Son    Social History  Substance Use Topics  . Smoking status: Former Smoker -- 0.25 packs/day for .5 years    Types: Cigarettes    Quit date: 07/28/2011  . Smokeless tobacco: Never Used  . Alcohol Use: No   OB History    Gravida Para Term Preterm AB TAB SAB Ectopic Multiple Living   Review of Systems  Constitutional: Negative for fever and chills.  Musculoskeletal: Positive for back pain.       Foot pain  Neurological: Positive for headaches. Negative for numbness.  Psychiatric/Behavioral: The patient is nervous/anxious.   All other systems reviewed and are negative.     Allergies  Naproxen  Home Medications   Prior to Admission medications   Medication Sig Start Date End Date Taking? Authorizing Provider  DULoxetine (CYMBALTA) 60 MG capsule Take 60 mg by mouth at bedtime.   Yes Historical Provider, MD  FIBER, CORN DEXTRIN, PO Take 1 tablet by mouth daily.   Yes Historical Provider, MD  linaclotide (LINZESS) 145 MCG CAPS capsule Take 145 mcg by mouth daily before breakfast.   Yes Historical Provider, MD  LYRICA 150 MG capsule Take 150 mg by mouth 2 (two) times daily. 07/29/15  Yes Historical Provider, MD  medroxyPROGESTERone (DEPO-PROVERA) 150 MG/ML injection Inject 1  mL (150 mg total) into the muscle every 3 (three) months. 07/25/15  Yes Adline PotterJennifer A Griffin, NP  pantoprazole (PROTONIX) 40 MG tablet Take 1 tablet (40 mg total) by mouth 2 (two) times daily before a meal. 01/01/16  Yes Anice PaganiniEric A Gill, NP  tizanidine (ZANAFLEX) 2 MG capsule Take 2 mg by mouth every 8 (eight) hours as needed. Max of 3 daily 07/02/15  Yes Historical Provider, MD  topiramate (TOPAMAX) 100 MG tablet Take 300 mg by mouth daily. 07/29/15  Yes Historical Provider, MD  valACYclovir (VALTREX) 1000 MG tablet Take 1 tablet (1,000 mg total) by mouth daily. 07/25/15  Yes Adline PotterJennifer A  Griffin, NP  diclofenac (VOLTAREN) 75 MG EC tablet Take 1 tablet (75 mg total) by mouth 2 (two) times daily. Take with food Patient not taking: Reported on 05/05/2016 04/27/16   Tammy Triplett, PA-C   BP 112/75 mmHg  Pulse 96  Temp(Src) 98.1 F (36.7 C) (Oral)  Resp 16  Ht 5\' 6"  (1.676 m)  Wt 139.708 kg  BMI 49.74 kg/m2  SpO2 99%  LMP 04/13/2016 Physical Exam  Constitutional: She is oriented to person, place, and time. She appears well-developed and well-nourished.  Non-toxic appearance.  HENT:  Head: Normocephalic.  Right Ear: Tympanic membrane and external ear normal.  Left Ear: Tympanic membrane and external ear normal.  Eyes: EOM and lids are normal. Pupils are equal, round, and reactive to light.  Neck: Normal range of motion. Neck supple. Carotid bruit is not present.  Cardiovascular: Normal rate, regular rhythm, normal heart sounds, intact distal pulses and normal pulses.   Pulmonary/Chest: Breath sounds normal. No respiratory distress.  Abdominal: Soft. Bowel sounds are normal. There is no tenderness. There is no guarding.  Musculoskeletal: Normal range of motion.  Good ROM of the right hip and knee and ankle. Pain to the lateral hind foot and plantar surface. No noted puncture wound. No red streaks. No draining areas. Achilles intact. DP 2+  Lymphadenopathy:       Head (right side): No submandibular adenopathy present.       Head (left side): No submandibular adenopathy present.    She has no cervical adenopathy.  Neurological: She is alert and oriented to person, place, and time. She has normal strength. No cranial nerve deficit or sensory deficit. Coordination normal.  No sensory deficit of the lower ext.  Skin: Skin is warm and dry.  Psychiatric: She has a normal mood and affect. Her speech is normal.  Nursing note and vitals reviewed.   ED Course  Procedures (including critical care time) Labs Review Labs Reviewed - No data to display  Imaging Review No results  found. I have personally reviewed and evaluated these images and lab results as part of my medical decision-making.   EKG Interpretation None      MDM  Vital signs stable. I reviewed the records from the last ED visit concerning the problem with foot pain. Xray of the right foot is neg for acute problem.. No fx or dislocation. Pt to see MD the the [facility]. Rx for ultram and decadron given to the patient. Discussed the importance of see MD at [facility] as soon as possible.   Final diagnoses:  Plantar fasciitis of right foot    *I have reviewed nursing notes, vital signs, and all appropriate lab and imaging results for this patient.[address]    Tieasha Larsen, PA-C 05/08/16 1210  Azalia BilisKevin Campos, MD 05/10/16 [phone]

## 2016-05-05 NOTE — ED Notes (Signed)
Pt reports continuing R foot pain. States she cannot get in to see MD she was referred to at last visit.

## 2016-05-05 NOTE — Discharge Instructions (Signed)
Please see the podiatry specialist listed above, or the podiatry specialist of your choice for management of this plantar fasciitis problem. Use Decadron 2 times daily with food, use Ultram for pain not improved by extra strength Tylenol. Ultram may cause drowsiness, please do not drink alcohol, drive a vehicle, operate machinery, or participate in activities requiring concentration when taking this medication. Plantar Fasciitis Plantar fasciitis is a painful foot condition that affects the heel. It occurs when the band of tissue that connects the toes to the heel bone (plantar fascia) becomes irritated. This can happen after exercising too much or doing other repetitive activities (overuse injury). The pain from plantar fasciitis can range from mild irritation to severe pain that makes it difficult for you to walk or move. The pain is usually worse in the morning or after you have been sitting or lying down for a while. CAUSES This condition may be caused by:  Standing for long periods of time.  Wearing shoes that do not fit.  Doing high-impact activities, including running, aerobics, and ballet.  Being overweight.  Having an abnormal way of walking (gait).  Having tight calf muscles.  Having high arches in your feet.  Starting a new athletic activity. SYMPTOMS The main symptom of this condition is heel pain. Other symptoms include:  Pain that gets worse after activity or exercise.  Pain that is worse in the morning or after resting.  Pain that goes away after you walk for a few minutes. DIAGNOSIS This condition may be diagnosed based on your signs and symptoms. Your health care provider will also do a physical exam to check for:  A tender area on the bottom of your foot.  A high arch in your foot.  Pain when you move your foot.  Difficulty moving your foot. You may also need to have imaging studies to confirm the diagnosis. These can  include:  X-rays.  Ultrasound.  MRI. TREATMENT  Treatment for plantar fasciitis depends on the severity of the condition. Your treatment may include:  Rest, ice, and over-the-counter pain medicines to manage your pain.  Exercises to stretch your calves and your plantar fascia.  A splint that holds your foot in a stretched, upward position while you sleep (night splint).  Physical therapy to relieve symptoms and prevent problems in the future.  Cortisone injections to relieve severe pain.  Extracorporeal shock wave therapy (ESWT) to stimulate damaged plantar fascia with electrical impulses. It is often used as a last resort before surgery.  Surgery, if other treatments have not worked after 12 months. HOME CARE INSTRUCTIONS  Take medicines only as directed by your health care provider.  Avoid activities that cause pain.  Roll the bottom of your foot over a bag of ice or a bottle of cold water. Do this for 20 minutes, 3-4 times a day.  Perform simple stretches as directed by your health care provider.  Try wearing athletic shoes with air-sole or gel-sole cushions or soft shoe inserts.  Wear a night splint while sleeping, if directed by your health care provider.  Keep all follow-up appointments with your health care provider. PREVENTION   Do not perform exercises or activities that cause heel pain.  Consider finding low-impact activities if you continue to have problems.  Lose weight if you need to. The best way to prevent plantar fasciitis is to avoid the activities that aggravate your plantar fascia. SEEK MEDICAL CARE IF:  Your symptoms do not go away after treatment with home care  measures.  Your pain gets worse.  Your pain affects your ability to move or do your daily activities.   This information is not intended to replace advice given to you by your health care provider. Make sure you discuss any questions you have with your health care provider.    Document Released: 07/28/2001 Document Revised: 07/24/2015 Document Reviewed: 09/12/2014 Elsevier Interactive Patient Education [employer]2016 [employer] Inc[employer]

## 2016-05-20 ENCOUNTER — Encounter: Payer: Self-pay | Admitting: Podiatry

## 2016-05-20 ENCOUNTER — Ambulatory Visit (INDEPENDENT_AMBULATORY_CARE_PROVIDER_SITE_OTHER): Payer: Medicaid Other | Admitting: Podiatry

## 2016-05-20 ENCOUNTER — Ambulatory Visit: Payer: Self-pay

## 2016-05-20 VITALS — BP 109/75 | HR 109 | Resp 16

## 2016-05-20 DIAGNOSIS — M722 Plantar fascial fibromatosis: Secondary | ICD-10-CM | POA: Diagnosis not present

## 2016-05-20 DIAGNOSIS — M79671 Pain in right foot: Secondary | ICD-10-CM

## 2016-05-20 MED ORDER — TRIAMCINOLONE ACETONIDE 10 MG/ML IJ SUSP
10.0000 mg | Freq: Once | INTRAMUSCULAR | Status: AC
  Administered 2016-05-20: 10 mg

## 2016-05-20 NOTE — Progress Notes (Signed)
Subjective:     Patient ID: [name], female   DOB: Jan 20, 1986, 30 y.o.   MRN: [id number]  HPI patient presents with exquisite discomfort plantar aspect right heel of one-month duration and does not remember specific injury   Review of Systems  All other systems reviewed and are negative.      Objective:   Physical Exam  Constitutional: She is oriented to person, place, and time.  Cardiovascular: Intact distal pulses.   Musculoskeletal: Normal range of motion.  Neurological: She is oriented to person, place, and time.  Skin: Skin is warm.  Nursing note and vitals reviewed.  neurovascular status intact muscle strength adequate range of motion within normal limits with patient found to have exquisite discomfort plantar aspect right heel at the insertional point tendon into the calcaneus with inflammation and fluid buildup noted. Moderate depression of the arch and digits that are well-perfused with patient well oriented 3     Assessment:     Acute plantar fasciitis right with fluid buildup    Plan:     H&P condition reviewed and injected the right plantar fashion 3 mg Kenalog 5 mill grams Xylocaine and applied fascial brace with instructions. Gave instructions on physical therapy and reappoint  X-ray report indicates small spur formation with no indication stress fracture arthritis

## 2016-05-20 NOTE — Progress Notes (Signed)
   Subjective:    Patient ID: [name], female    DOB: 1986/10/19, 30 y.o.   MRN: [id number]  HPI    Review of Systems  Cardiovascular: Positive for leg swelling.  All other systems reviewed and are negative.      Objective:   Physical Exam        Assessment & Plan:

## 2016-05-20 NOTE — Patient Instructions (Signed)

## 2016-06-02 ENCOUNTER — Emergency Department (HOSPITAL_COMMUNITY)
Admission: EM | Admit: 2016-06-02 | Discharge: 2016-06-02 | Disposition: A | Discharge status: Home / Self Care | Payer: Medicaid Other | Attending: Emergency Medicine | Admitting: Emergency Medicine

## 2016-06-02 ENCOUNTER — Encounter (HOSPITAL_COMMUNITY): Payer: Self-pay

## 2016-06-02 DIAGNOSIS — L03311 Cellulitis of abdominal wall: Secondary | ICD-10-CM | POA: Diagnosis present

## 2016-06-02 DIAGNOSIS — Z87891 Personal history of nicotine dependence: Secondary | ICD-10-CM | POA: Diagnosis not present

## 2016-06-02 DIAGNOSIS — Z79899 Other long term (current) drug therapy: Secondary | ICD-10-CM | POA: Insufficient documentation

## 2016-06-02 MED ORDER — SULFAMETHOXAZOLE-TRIMETHOPRIM 800-160 MG PO TABS: 1.0000 | ORAL_TABLET | Freq: Two times a day (BID) | ORAL | Status: AC

## 2016-06-02 NOTE — ED Provider Notes (Signed)
CSN: [id number]     Arrival date & time 06/02/16  1219 History   First MD Initiated Contact with Patient 06/02/16 1232     Chief Complaint  Patient presents with  . Abscess     (Consider location/radiation/quality/duration/timing/severity/associated sxs/prior Treatment) The history is provided by the patient.   [name] is a 30 y.o. female presenting with a one day history of painful lesion on her abdomen which is tender to palpation and getting increasingly red.  She denies drainage from the site and denies any known skin injury, insect bite or prior episode of similar symptoms.        Past Medical History  Diagnosis Date  . Herpes   . GERD (gastroesophageal reflux disease)   . Gastritis   . Disk prolapse   . Bulging disc   . Obesity   . Mental disorder     anxiety  . Other and unspecified ovarian cyst 12/01/2013  . Elevated liver enzymes 12/01/2013  . Ovarian cyst, right   . Headache   . Abdominal pain, chronic, right lower quadrant   . Abdominal pain, chronic, epigastric   . Contraceptive management 07/18/2014  . Constipation    Past Surgical History  Procedure Laterality Date  . Wisdom tooth extraction    . Esophagogastroduodenoscopy  01/19/2012    ION:GEXBMW,UXLKGMWN IN THE ANTRUM/HIATAL HERNIA/  . Colonoscopy with esophagogastroduodenoscopy (egd) N/A 02/24/2013    [id number] RECTAL BLEEDING DUE TO Moderate sized internal hemorrhoids, EGD: erosive esophagitis due to uncontrolled reflux, moderate erosive gastritis, benign path   Family History  Problem Relation Age of Onset  . GER disease Mother   . Other Mother     diverticulitis; hernia  . Diabetes Mother     borderline  . Diabetes Other   . Colon cancer Neg Hx   . Colon polyps Neg Hx   . Diabetes Maternal Grandmother   . COPD Maternal Grandmother   . Diabetes Maternal Grandfather   . Congestive Heart Failure Maternal Grandfather   . Headache Son    Social History  Substance Use Topics  .  Smoking status: Former Smoker -- 0.25 packs/day for .5 years    Types: Cigarettes    Quit date: 07/28/2011  . Smokeless tobacco: Never Used  . Alcohol Use: No   OB History    Gravida Para Term Preterm AB TAB SAB Ectopic Multiple Living   Review of Systems  Constitutional: Negative for fever and chills.  Respiratory: Negative for shortness of breath and wheezing.   Skin: Positive for color change.  Neurological: Negative for numbness.      Allergies  Naproxen  Home Medications   Prior to Admission medications   Medication Sig Start Date End Date Taking? Authorizing Provider  dexamethasone (DECADRON) 4 MG tablet Take 1 tablet (4 mg total) by mouth 2 (two) times daily with a meal. 05/05/16   Ivery Quale, PA-C  diclofenac (VOLTAREN) 75 MG EC tablet Take 1 tablet (75 mg total) by mouth 2 (two) times daily. Take with food 04/27/16   Tammy Triplett, PA-C  DULoxetine (CYMBALTA) 60 MG capsule Take 60 mg by mouth at bedtime.    Historical Provider, MD  FIBER, CORN DEXTRIN, PO Take 1 tablet by mouth daily.    Historical Provider, MD  linaclotide (LINZESS) 145 MCG CAPS capsule Take 145 mcg by mouth daily before breakfast.    Historical Provider, MD  LYRICA 150  MG capsule Take 150 mg by mouth 2 (two) times daily. 07/29/15   Historical Provider, MD  medroxyPROGESTERone (DEPO-PROVERA) 150 MG/ML injection Inject 1 mL (150 mg total) into the muscle every 3 (three) months. 07/25/15   Adline PotterJennifer A Griffin, NP  pantoprazole (PROTONIX) 40 MG tablet Take 1 tablet (40 mg total) by mouth 2 (two) times daily before a meal. 01/01/16   Anice PaganiniEric A Gill, NP  sulfamethoxazole-trimethoprim (BACTRIM DS,SEPTRA DS) 800-160 MG tablet Take 1 tablet by mouth 2 (two) times daily. 06/02/16 06/09/16  Burgess AmorJulie Lucien Budney, PA-C  tizanidine (ZANAFLEX) 2 MG capsule Take 2 mg by mouth every 8 (eight) hours as needed. Max of 3 daily 07/02/15   Historical Provider, MD  topiramate (TOPAMAX) 100 MG tablet Take 300 mg by mouth  daily. 07/29/15   Historical Provider, MD  traMADol (ULTRAM) 50 MG tablet Take 1 tablet (50 mg total) by mouth every 6 (six) hours as needed. 05/05/16   Ivery QualeHobson Bryant, PA-C  valACYclovir (VALTREX) 1000 MG tablet Take 1 tablet (1,000 mg total) by mouth daily. 07/25/15   Adline PotterJennifer A Griffin, NP   BP 120/70 mmHg  Pulse 95  Temp(Src) 98.4 F (36.9 C) (Oral)  Resp 18  Ht 5\' 6"  (1.676 m)  Wt 139.708 kg  BMI 49.74 kg/m2  SpO2 98%  LMP 04/30/2016 Physical Exam  Constitutional: She is oriented to person, place, and time. She appears well-developed and well-nourished.  HENT:  Head: Normocephalic.  Neck: Neck supple.  Cardiovascular: Normal rate.   Pulmonary/Chest: Effort normal.  Abdominal: She exhibits no distension. There is tenderness.  See skin exam  Musculoskeletal: Normal range of motion.  Neurological: She is alert and oriented to person, place, and time. No sensory deficit.  Skin: There is erythema.  Tender area of erythema left lower abdomen in skin fold, 3 cm diameter with clearly demarcated edges. No drainage or tenting.  Subtle central induration.  No red streaking.     ED Course  Procedures (including critical care time) Labs Review Labs Reviewed  POC URINE PREG, ED    Imaging Review No results found. I have personally reviewed and evaluated these images and lab results as part of my medical decision-making.   EKG Interpretation None      MDM   Final diagnoses:  Cellulitis of abdominal wall    Small focus of probable infected insect bite or papule.  No fluctuance or pus pocket identified.  She was advised warm soaks, bactrim. F/u with pcp for a recheck  if not improving with abx or for any worsened pain and spreading redness.     Burgess AmorJulie Rielynn Trulson, PA-C 06/02/16 1630  Vanetta MuldersScott Zackowski, MD 06/04/16 [phone]

## 2016-06-02 NOTE — Discharge Instructions (Signed)

## 2016-06-02 NOTE — ED Notes (Signed)
Pt has abrasion/abscess on left lower abdominal fold with no drainage or fever

## 2016-06-03 ENCOUNTER — Ambulatory Visit (INDEPENDENT_AMBULATORY_CARE_PROVIDER_SITE_OTHER): Payer: Medicaid Other | Admitting: Podiatry

## 2016-06-03 ENCOUNTER — Encounter: Payer: Self-pay | Admitting: Podiatry

## 2016-06-03 DIAGNOSIS — M722 Plantar fascial fibromatosis: Secondary | ICD-10-CM

## 2016-06-03 MED ORDER — TRIAMCINOLONE ACETONIDE 10 MG/ML IJ SUSP: 10.0000 mg | Freq: Once | INTRAMUSCULAR | Status: DC

## 2016-06-03 NOTE — Progress Notes (Signed)
Subjective:     Patient ID: [name], female   DOB: Feb 15, 1986, 30 y.o.   MRN: [id number]  HPI patient presents stating she's getting severe pain in the bottom of her right heel and now in her forefoot and states that she's not able to bear proper weight on it. States it's worse when she gets up or after periods of sitting   Review of Systems     Objective:   Physical Exam Neurovascular status intact muscle strength adequate with exquisite discomfort plantar aspect of the right heel with inflammation fluid buildup around the medial band still noted and moderate forefoot pain which is due to most likely change in gait    Assessment:     Acute plantar fasciitis which is not reduced at this time    Plan:     Reviewed condition and at this time injected the plantar fascia 3 mg Kenalog 5 mg Xylocaine and placed into an air fracture walker to completely immobilize. Reappoint if symptoms do not get better

## 2016-06-11 ENCOUNTER — Emergency Department (HOSPITAL_COMMUNITY): Payer: Medicaid Other

## 2016-06-11 ENCOUNTER — Emergency Department (HOSPITAL_COMMUNITY)
Admission: EM | Admit: 2016-06-11 | Discharge: 2016-06-11 | Disposition: A | Discharge status: Home / Self Care | Payer: Medicaid Other | Attending: Emergency Medicine | Admitting: Emergency Medicine

## 2016-06-11 ENCOUNTER — Encounter (HOSPITAL_COMMUNITY): Payer: Self-pay | Admitting: Emergency Medicine

## 2016-06-11 DIAGNOSIS — Z87891 Personal history of nicotine dependence: Secondary | ICD-10-CM | POA: Insufficient documentation

## 2016-06-11 DIAGNOSIS — Z79899 Other long term (current) drug therapy: Secondary | ICD-10-CM | POA: Diagnosis not present

## 2016-06-11 DIAGNOSIS — R1030 Lower abdominal pain, unspecified: Secondary | ICD-10-CM

## 2016-06-11 DIAGNOSIS — N730 Acute parametritis and pelvic cellulitis: Secondary | ICD-10-CM | POA: Insufficient documentation

## 2016-06-11 LAB — COMPREHENSIVE METABOLIC PANEL
ALK PHOS: 90 U/L (ref 38–126)
ALT: 21 U/L (ref 14–54)
AST: 50 U/L — AB (ref 15–41)
Albumin: 3.6 g/dL (ref 3.5–5.0)
Anion gap: 4 — ABNORMAL LOW (ref 5–15)
BILIRUBIN TOTAL: 0.3 mg/dL (ref 0.3–1.2)
BUN: 15 mg/dL (ref 6–20)
CO2: 16 mmol/L — ABNORMAL LOW (ref 22–32)
Calcium: 8.4 mg/dL — ABNORMAL LOW (ref 8.9–10.3)
Chloride: 116 mmol/L — ABNORMAL HIGH (ref 101–111)
Creatinine, Ser: 1.15 mg/dL — ABNORMAL HIGH (ref 0.44–1.00)
GFR calc Af Amer: >60 mL/min (ref >60)
Glucose, Bld: 114 mg/dL — ABNORMAL HIGH (ref 65–99)
Potassium: 3.9 mmol/L (ref 3.5–5.1)
Sodium: 136 mmol/L (ref 135–145)
TOTAL PROTEIN: 7.1 g/dL (ref 6.5–8.1)

## 2016-06-11 LAB — WET PREP, GENITAL
SPERM: NONE SEEN
Trich, Wet Prep: NONE SEEN
YEAST WET PREP: NONE SEEN

## 2016-06-11 LAB — LIPASE, BLOOD: Lipase: 36 U/L (ref 11–51)

## 2016-06-11 LAB — URINALYSIS, ROUTINE W REFLEX MICROSCOPIC
Bilirubin Urine: NEGATIVE
GLUCOSE, UA: NEGATIVE mg/dL
Hgb urine dipstick: NEGATIVE
KETONES UR: NEGATIVE mg/dL
LEUKOCYTES UA: NEGATIVE
NITRITE: NEGATIVE
PH: 6 (ref 5.0–8.0)
Protein, ur: 30 mg/dL — AB

## 2016-06-11 LAB — CBC
HEMATOCRIT: 37 % (ref 36.0–46.0)
HEMOGLOBIN: 12.3 g/dL (ref 12.0–15.0)
MCH: 27.9 pg (ref 26.0–34.0)
MCHC: 33.2 g/dL (ref 30.0–36.0)
MCV: 83.9 fL (ref 78.0–100.0)
Platelets: 229 10*3/uL (ref 150–400)
RBC: 4.41 MIL/uL (ref 3.87–5.11)
RDW: 15.1 % (ref 11.5–15.5)
WBC: 6.1 10*3/uL (ref 4.0–10.5)

## 2016-06-11 LAB — URINE MICROSCOPIC-ADD ON

## 2016-06-11 MED ORDER — IBUPROFEN 400 MG PO TABS: 400.0000 mg | ORAL_TABLET | Freq: Three times a day (TID) | ORAL | 0 refills | Status: DC | PRN

## 2016-06-11 MED ORDER — ONDANSETRON HCL 4 MG PO TABS: 4.0000 mg | ORAL_TABLET | Freq: Three times a day (TID) | ORAL | 0 refills | Status: DC | PRN

## 2016-06-11 MED ORDER — DOXYCYCLINE HYCLATE 100 MG PO CAPS: 100.0000 mg | ORAL_CAPSULE | Freq: Two times a day (BID) | ORAL | 0 refills | Status: AC

## 2016-06-11 MED ORDER — CEFTRIAXONE SODIUM 250 MG IJ SOLR
250.0000 mg | Freq: Once | INTRAMUSCULAR | Status: AC
  Administered 2016-06-11: 250 mg via INTRAMUSCULAR
  Filled 2016-06-11: qty 250

## 2016-06-11 MED ORDER — DIATRIZOATE MEGLUMINE & SODIUM 66-10 % PO SOLN
ORAL | Status: AC
  Filled 2016-06-11: qty 30

## 2016-06-11 MED ORDER — METRONIDAZOLE 500 MG PO TABS: 500.0000 mg | ORAL_TABLET | Freq: Two times a day (BID) | ORAL | 0 refills | Status: DC

## 2016-06-11 MED ORDER — HYDROCODONE-ACETAMINOPHEN 5-325 MG PO TABS
1.0000 | ORAL_TABLET | Freq: Once | ORAL | Status: AC
  Administered 2016-06-11: 1 via ORAL
  Filled 2016-06-11: qty 1

## 2016-06-11 MED ORDER — SODIUM CHLORIDE 0.9 % IV BOLUS (SEPSIS)
1000.0000 mL | Freq: Once | INTRAVENOUS | Status: AC
  Administered 2016-06-11: 1000 mL via INTRAVENOUS

## 2016-06-11 MED ORDER — MORPHINE SULFATE (PF) 4 MG/ML IV SOLN
4.0000 mg | Freq: Once | INTRAVENOUS | Status: AC
  Administered 2016-06-11: 4 mg via INTRAVENOUS
  Filled 2016-06-11: qty 1

## 2016-06-11 MED ORDER — IOPAMIDOL (ISOVUE-300) INJECTION 61%
100.0000 mL | Freq: Once | INTRAVENOUS | Status: AC | PRN
  Administered 2016-06-11: 100 mL via INTRAVENOUS

## 2016-06-11 MED ORDER — LIDOCAINE HCL (PF) 1 % IJ SOLN
INTRAMUSCULAR | Status: AC
  Filled 2016-06-11: qty 5

## 2016-06-11 MED ORDER — KETOROLAC TROMETHAMINE 30 MG/ML IJ SOLN
15.0000 mg | Freq: Once | INTRAMUSCULAR | Status: AC
  Administered 2016-06-11: 15 mg via INTRAVENOUS
  Filled 2016-06-11: qty 1

## 2016-06-11 NOTE — ED Notes (Signed)
Completed gastrographin at [facility]

## 2016-06-11 NOTE — ED Provider Notes (Signed)
[facility]-EMERGENCY DEPT Provider Note   CSN: [id number] Arrival date & time: 06/11/16  1124  First Provider Contact:  None    By signing my name below, I, Doreatha Martin, attest that this documentation has been prepared under the direction and in the presence of Shaune Pollack, MD. Electronically Signed: Doreatha Martin, ED Scribe. 06/11/16. 12:16 PM.     History   Chief Complaint Chief Complaint  Patient presents with  . Abdominal Pain  . Vaginal Pain  . Flank Pain    HPI THEDA PAYER is a 30 y.o. female who presents to the Emergency Department complaining of moderate, constant burning vaginal pain onset 2 weeks ago. Pt states associated dysuria, urinary hesitancy. She states she has been taking Cystex, which provided temporary relief during the first week of her symptoms. Pt states her vaginal pain is worsened with urination. Pt states her pain is more severe than prior UTI. No h/o hospitalization for kidney infections. She denies emesis, vaginal discharge, fever, flank pain.   The history is provided by the patient. No language interpreter was used.    Past Medical History:  Diagnosis Date  . Abdominal pain, chronic, epigastric   . Abdominal pain, chronic, right lower quadrant   . Bulging disc   . Constipation   . Contraceptive management 07/18/2014  . Disk prolapse   . Elevated liver enzymes 12/01/2013  . Gastritis   . GERD (gastroesophageal reflux disease)   . Headache   . Herpes   . Mental disorder    anxiety  . Obesity   . Other and unspecified ovarian cyst 12/01/2013  . Ovarian cyst, right     Patient Active Problem List   Diagnosis Date Noted  . Constipation 01/01/2016  . Abdominal pain 01/01/2016  . Obesity 07/25/2015  . Vomiting and diarrhea 11/11/2014  . Acute gastroenteritis 11/11/2014  . UTI (lower urinary tract infection) 11/11/2014  . Hypokalemia 11/11/2014  . Morbid obesity (HCC) 11/11/2014  . Contraceptive management 07/18/2014  . Abdominal pain, acute  06/08/2014  . Nausea & vomiting 06/08/2014  . Dehydration 06/08/2014  . Intractable nausea and vomiting 06/08/2014  . Gastritis   . Abdominal pain, chronic, epigastric   . Abdominal pain, chronic, right lower quadrant musculoskeletal 02/06/2014  . Other and unspecified ovarian cyst 12/05/2013  . Other and unspecified ovarian cyst 12/01/2013  . Elevated liver enzymes 12/01/2013  . Internal hemorrhoids with other complication 11/23/2013  . Unspecified constipation 08/07/2013  . Abdominal pain, other specified site 02/03/2013  . Rectal bleeding 02/03/2013  . Lumbar pain 07/14/2012  . Lumbar radiculopathy 07/14/2012  . Nausea and vomiting 01/11/2012  . GERD (gastroesophageal reflux disease) 01/11/2012  . KNEE PAIN 06/13/2009  . CONTUSION, LEFT KNEE 06/13/2009    Past Surgical History:  Procedure Laterality Date  . COLONOSCOPY WITH ESOPHAGOGASTRODUODENOSCOPY (EGD) N/A 02/24/2013   YQM:VHQIONGEXBMW RECTAL BLEEDING DUE TO Moderate sized internal hemorrhoids, EGD: erosive esophagitis due to uncontrolled reflux, moderate erosive gastritis, benign path  . ESOPHAGOGASTRODUODENOSCOPY  01/19/2012   UXL:KGMWNU,UVOZDGUY IN THE ANTRUM/HIATAL HERNIA/  . WISDOM TOOTH EXTRACTION      OB History    Gravida Para Term Preterm AB Living   1 1 1     1    SAB TAB Ectopic Multiple Live Births                   Home Medications    Prior to Admission medications   Medication Sig Start Date End Date Taking? Authorizing Provider  diclofenac (VOLTAREN)  50 MG EC tablet Take 50 mg by mouth 2 (two) times daily. 05/21/16  Yes Historical Provider, MD  DULoxetine (CYMBALTA) 60 MG capsule Take 60 mg by mouth at bedtime.   Yes Historical Provider, MD  FIBER, CORN DEXTRIN, PO Take 1 tablet by mouth daily.   Yes Historical Provider, MD  linaclotide (LINZESS) 145 MCG CAPS capsule Take 145 mcg by mouth daily before breakfast.   Yes Historical Provider, MD  LYRICA 150 MG capsule Take 150 mg by mouth 2 (two) times  daily. 07/29/15  Yes Historical Provider, MD  medroxyPROGESTERone (DEPO-PROVERA) 150 MG/ML injection Inject 1 mL (150 mg total) into the muscle every 3 (three) months. 07/25/15  Yes Adline Potter, NP  Melatonin 5 MG TABS Take 1 tablet by mouth at bedtime.   Yes Historical Provider, MD  pantoprazole (PROTONIX) 40 MG tablet Take 1 tablet (40 mg total) by mouth 2 (two) times daily before a meal. 01/01/16  Yes Anice Paganini, NP  tiZANidine (ZANAFLEX) 2 MG tablet Take 2 mg by mouth every 8 (eight) hours as needed for muscle spasms.  05/21/16  Yes Historical Provider, MD  topiramate (TOPAMAX) 100 MG tablet Take 300 mg by mouth daily. 07/29/15  Yes Historical Provider, MD  traMADol (ULTRAM) 50 MG tablet Take 1 tablet (50 mg total) by mouth every 6 (six) hours as needed. Patient taking differently: Take 50 mg by mouth every 6 (six) hours as needed for moderate pain.  05/05/16  Yes Ivery Quale, PA-C  valACYclovir (VALTREX) 1000 MG tablet Take 1 tablet (1,000 mg total) by mouth daily. 07/25/15  Yes Adline Potter, NP  dexamethasone (DECADRON) 4 MG tablet Take 1 tablet (4 mg total) by mouth 2 (two) times daily with a meal. Patient not taking: Reported on 06/11/2016 05/05/16   Ivery Quale, PA-C  diclofenac (VOLTAREN) 75 MG EC tablet Take 1 tablet (75 mg total) by mouth 2 (two) times daily. Take with food Patient not taking: Reported on 06/11/2016 04/27/16   Tammy Triplett, PA-C  doxycycline (VIBRAMYCIN) 100 MG capsule Take 1 capsule (100 mg total) by mouth 2 (two) times daily. 06/11/16 06/25/16  Shaune Pollack, MD  ibuprofen (ADVIL,MOTRIN) 400 MG tablet Take 1 tablet (400 mg total) by mouth every 8 (eight) hours as needed for moderate pain. 06/11/16   Shaune Pollack, MD  metroNIDAZOLE (FLAGYL) 500 MG tablet Take 1 tablet (500 mg total) by mouth 2 (two) times daily. 06/11/16   Shaune Pollack, MD  ondansetron (ZOFRAN) 4 MG tablet Take 1 tablet (4 mg total) by mouth every 8 (eight) hours as needed for nausea or vomiting.  06/11/16   Shaune Pollack, MD    Family History Family History  Problem Relation Age of Onset  . GER disease Mother   . Other Mother     diverticulitis; hernia  . Diabetes Mother     borderline  . Diabetes Maternal Grandmother   . COPD Maternal Grandmother   . Diabetes Maternal Grandfather   . Congestive Heart Failure Maternal Grandfather   . Headache Son   . Diabetes Other   . Colon cancer Neg Hx   . Colon polyps Neg Hx     Social History Social History  Substance Use Topics  . Smoking status: Former Smoker    Packs/day: 0.25    Years: 0.50    Types: Cigarettes    Quit date: 07/28/2011  . Smokeless tobacco: Never Used  . Alcohol use No     Allergies   Naproxen  Review of Systems Review of Systems  Constitutional: Negative for chills, fatigue and fever.  HENT: Negative for congestion and rhinorrhea.   Eyes: Negative for visual disturbance.  Respiratory: Negative for cough, shortness of breath and wheezing.   Cardiovascular: Negative for chest pain and leg swelling.  Gastrointestinal: Negative for abdominal pain, diarrhea, nausea and vomiting.  Genitourinary: Positive for difficulty urinating, dysuria and vaginal pain. Negative for flank pain and vaginal discharge.  Musculoskeletal: Negative for neck pain and neck stiffness.  Skin: Negative for rash and wound.  Allergic/Immunologic: Negative for immunocompromised state.  Neurological: Negative for syncope, weakness and headaches.     Physical Exam Updated Vital Signs BP 126/81 (BP Location: Left Arm)   Pulse 112   Temp 97.5 F (36.4 C) (Temporal)   Resp 20   Ht 5\' 6"  (1.676 m)   Wt (!) 308 lb (139.7 kg)   LMP 04/26/2016 Comment: depo inj  SpO2 100%   BMI 49.71 kg/m   Physical Exam  Constitutional: She appears well-developed and well-nourished.  HENT:  Head: Normocephalic.  Eyes: Conjunctivae are normal.  Cardiovascular: Normal rate, regular rhythm and normal heart sounds.  Exam reveals no  friction rub.   No murmur heard. Pulmonary/Chest: Effort normal and breath sounds normal. No respiratory distress. She has no wheezes. She has no rales.  Lungs CTA bilaterally.   Abdominal: Soft. She exhibits no distension. There is tenderness.  Suprapubic abdominal tenderness. No RLQ tenderness. No CVA tenderness.   Genitourinary: Pelvic exam was performed with patient supine. Uterus is tender. Cervix exhibits motion tenderness and discharge. Cervix exhibits no friability. Right adnexum displays no mass and no tenderness. Left adnexum displays no mass and no tenderness. No signs of injury around the vagina. Vaginal discharge found.  Musculoskeletal: Normal range of motion.  Neurological: She is alert.  Skin: Skin is warm and dry.  Psychiatric: She has a normal mood and affect. Her behavior is normal.  Nursing note and vitals reviewed.    ED Treatments / Results  Labs (all labs ordered are listed, but only abnormal results are displayed) Labs Reviewed  WET PREP, GENITAL - Abnormal; Notable for the following:       Result Value   Clue Cells Wet Prep HPF POC PRESENT (*)    WBC, Wet Prep HPF POC MODERATE (*)    All other components within normal limits  COMPREHENSIVE METABOLIC PANEL - Abnormal; Notable for the following:    Chloride 116 (*)    CO2 16 (*)    Glucose, Bld 114 (*)    Creatinine, Ser 1.15 (*)    Calcium 8.4 (*)    AST 50 (*)    Anion gap 4 (*)    All other components within normal limits  URINALYSIS, ROUTINE W REFLEX MICROSCOPIC (NOT AT [facility]) - Abnormal; Notable for the following:    Specific Gravity, Urine >1.030 (*)    Protein, ur 30 (*)    All other components within normal limits  URINE MICROSCOPIC-ADD ON - Abnormal; Notable for the following:    Squamous Epithelial / LPF 0-5 (*)    Bacteria, UA FEW (*)    All other components within normal limits  LIPASE, BLOOD  CBC  GC/CHLAMYDIA PROBE AMP ([place]) NOT AT [facility]    EKG  EKG Interpretation None        Radiology Ct Abdomen Pelvis W Contrast  Result Date: 06/11/2016 CLINICAL DATA:  Lower pelvic pain for 2 weeks. EXAM: CT ABDOMEN AND PELVIS WITH CONTRAST  TECHNIQUE: Multidetector CT imaging of the abdomen and pelvis was performed using the standard protocol following bolus administration of intravenous contrast. CONTRAST:  ISOVUE-300 IOPAMIDOL (ISOVUE-300) INJECTION 61% COMPARISON:  CT abdomen dated 03/27/2016 FINDINGS: Lower chest:  No acute findings. Hepatobiliary: Liver and gallbladder appear normal. No bile duct dilatation appreciated. Pancreas: No mass, inflammatory changes, or other significant abnormality. Spleen: Stable mild splenomegaly. Adrenals/Urinary Tract: Adrenal glands appear normal. Mild scarring at the upper pole of the left kidney appears stable. Kidneys otherwise unremarkable bilaterally without mass, stone or hydronephrosis. No ureteral or bladder calculi identified. Bladder appears normal, partially decompressed. Stomach/Bowel: Bowel is normal in caliber. No bowel wall thickening or evidence of bowel wall inflammation. Appendix is normal. Stomach appears normal. Vascular/Lymphatic: Abdominal aorta is normal in caliber. No vascular abnormality identified. No enlarged lymph nodes seen within the abdomen or pelvis. Reproductive: No mass or other significant abnormality. No mass or free fluid within either adnexal region. Other: No free fluid or abscess collection. No free intraperitoneal air. Musculoskeletal: No acute or suspicious osseous findings. Superficial soft tissues are unremarkable. IMPRESSION: 1. No acute findings within the abdomen or pelvis. No free fluid or inflammatory change. No evidence of acute solid organ abnormality. No renal or ureteral calculi. No bowel obstruction or evidence of bowel wall inflammation. No mass or lymphadenopathy. Appendix is normal. 2. Stable mild splenomegaly. Electronically Signed   By: Bary Richard M.D.   On: 06/11/2016  16:14   Procedures Procedures (including critical care time)  DIAGNOSTIC STUDIES: Oxygen Saturation is 100% on RA, normal by my interpretation.    COORDINATION OF CARE: 12:15 PM Discussed treatment plan with pt at bedside which includes UA and pt agreed to plan.    Medications Ordered in ED Medications  diatrizoate meglumine-sodium (GASTROGRAFIN) 66-10 % solution (not administered)  lidocaine (PF) (XYLOCAINE) 1 % injection (not administered)  sodium chloride 0.9 % bolus 1,000 mL (0 mLs Intravenous Stopped 06/11/16 1430)  morphine 4 MG/ML injection 4 mg (4 mg Intravenous Given 06/11/16 1244)  sodium chloride 0.9 % bolus 1,000 mL (0 mLs Intravenous Stopped 06/11/16 1645)  ketorolac (TORADOL) 30 MG/ML injection 15 mg (15 mg Intravenous Given 06/11/16 1329)  iopamidol (ISOVUE-300) 61 % injection 100 mL (100 mLs Intravenous Contrast Given 06/11/16 1536)  HYDROcodone-acetaminophen (NORCO/VICODIN) 5-325 MG per tablet 1 tablet (1 tablet Oral Given 06/11/16 1611)  cefTRIAXone (ROCEPHIN) injection 250 mg (250 mg Intramuscular Given 06/11/16 1630)     Initial Impression / Assessment and Plan / ED Course  I have reviewed the triage vital signs and the nursing notes.  Pertinent labs & imaging results that were available during my care of the patient were reviewed by me and considered in my medical decision making (see chart for details).  30 yo F with no significant PMHx who presents with suprapubic abdominal pain, vaginal pain. See HPI above. On arrival, VSS and WNL. Exam as above. Labs reviewed. CBC shows no leukocytosis or anemia. UA without signs of UTI. CMP shows CO2 16 - likely 2/2 dehydration but AG is normal, no evidence of sepsis or systemic illness. No fevers. Wet prep + BV. UA with s.g.>1.030, c/w dehydration. IVF given. Pelvic exam c/w PID, but will obtain CT given CO2 16, dehydration, and TTP on exam.  CT scan shows no appendicitis, obstruction, or other abnormality. No signs of TOA and pt  has no adnexal TTP. She is now well-appearing on exam with HR 90s. She is tolerating PO. Discussed labs, imaging with pt. Will treat  empirically for PID, advise safe sex, and return precautions. PT in agreement. Return precautions given.  Final Clinical Impressions(s) / ED Diagnoses   Final diagnoses:  PID (acute pelvic inflammatory disease)  Suprapubic pain, unspecified laterality    New Prescriptions Discharge Medication List as of 06/11/2016  4:20 PM    START taking these medications   Details  doxycycline (VIBRAMYCIN) 100 MG capsule Take 1 capsule (100 mg total) by mouth 2 (two) times daily., Starting Thu 06/11/2016, Until Thu 06/25/2016, Print    ibuprofen (ADVIL,MOTRIN) 400 MG tablet Take 1 tablet (400 mg total) by mouth every 8 (eight) hours as needed for moderate pain., Starting Thu 06/11/2016, Print    metroNIDAZOLE (FLAGYL) 500 MG tablet Take 1 tablet (500 mg total) by mouth 2 (two) times daily., Starting Thu 06/11/2016, Print        I personally performed the services described in this documentation, which was scribed in my presence. The recorded information has been reviewed and is accurate.       Shaune Pollack, MD 06/11/16 2034

## 2016-06-11 NOTE — ED Triage Notes (Signed)
Pt reports abdominal pain and vaginal pain without discharge x2 weeks.  Pt denies odor to urine or vagina area.  Pt reports fevers, burning with urination, right flank pain, and occasional sob.  Pt anxious in triage.

## 2016-06-11 NOTE — ED Notes (Signed)
Pt called out to desk to see nurse. Upon entering the room the pt noted that her IV was not flowing. Upon assessment, swelling noted above the IV site. Pt denies any pain. Fluids are not infusing. Unable to flush IV. IV d/c and pressure dressing applied.

## 2016-06-12 LAB — GC/CHLAMYDIA PROBE AMP (~~LOC~~) NOT AT ARMC
CHLAMYDIA, DNA PROBE: NEGATIVE
NEISSERIA GONORRHEA: NEGATIVE

## 2016-06-14 ENCOUNTER — Emergency Department (HOSPITAL_COMMUNITY)
Admission: EM | Admit: 2016-06-14 | Discharge: 2016-06-14 | Disposition: A | Discharge status: Home / Self Care | Payer: Medicaid Other | Attending: Emergency Medicine | Admitting: Emergency Medicine

## 2016-06-14 ENCOUNTER — Encounter (HOSPITAL_COMMUNITY): Payer: Self-pay | Admitting: Emergency Medicine

## 2016-06-14 DIAGNOSIS — Z87891 Personal history of nicotine dependence: Secondary | ICD-10-CM | POA: Diagnosis not present

## 2016-06-14 DIAGNOSIS — Z791 Long term (current) use of non-steroidal anti-inflammatories (NSAID): Secondary | ICD-10-CM | POA: Insufficient documentation

## 2016-06-14 DIAGNOSIS — N341 Nonspecific urethritis: Secondary | ICD-10-CM | POA: Diagnosis not present

## 2016-06-14 DIAGNOSIS — Z79899 Other long term (current) drug therapy: Secondary | ICD-10-CM | POA: Insufficient documentation

## 2016-06-14 DIAGNOSIS — R102 Pelvic and perineal pain: Secondary | ICD-10-CM | POA: Diagnosis present

## 2016-06-14 DIAGNOSIS — N342 Other urethritis: Secondary | ICD-10-CM

## 2016-06-14 LAB — PREGNANCY, URINE: PREG TEST UR: NEGATIVE

## 2016-06-14 LAB — URINALYSIS, ROUTINE W REFLEX MICROSCOPIC
Bilirubin Urine: NEGATIVE
Glucose, UA: NEGATIVE mg/dL
Hgb urine dipstick: NEGATIVE
Nitrite: NEGATIVE
Specific Gravity, Urine: >1.03 — ABNORMAL HIGH (ref 1.005–1.030)
pH: 5.5 (ref 5.0–8.0)

## 2016-06-14 LAB — URINE MICROSCOPIC-ADD ON

## 2016-06-14 MED ORDER — CEPHALEXIN 500 MG PO CAPS: 500.0000 mg | ORAL_CAPSULE | Freq: Four times a day (QID) | ORAL | 0 refills | Status: AC

## 2016-06-14 MED ORDER — CEPHALEXIN 500 MG PO CAPS
500.0000 mg | ORAL_CAPSULE | Freq: Once | ORAL | Status: AC
Start: 2016-06-14 — End: 2016-06-14
  Administered 2016-06-14: 500 mg via ORAL
  Filled 2016-06-14: qty 1

## 2016-06-14 NOTE — ED Triage Notes (Signed)
Pt states she continues to have vaginal pain.  States she was here 3 days ago and dx with PID and is no better.

## 2016-06-14 NOTE — ED Provider Notes (Signed)
[facility]-EMERGENCY DEPT Provider Note   CSN: [id number] Arrival date & time: 06/14/16  1143  First Provider Contact:   First MD Initiated Contact with Patient 06/14/16 1301       By signing my name below, I, Tanda Rockers, attest that this documentation has been prepared under the direction and in the presence of Eber Hong, MD. Electronically Signed: Tanda Rockers, ED Scribe. 06/14/16. 1:08 PM.  History   Chief Complaint Chief Complaint  Patient presents with  . Vaginal Pain    HPI [name] is a 30 y.o. female who presents to the Emergency Department complaining of gradual onset, constant, stabbing and burning vaginal pain x 2 weeks. She also complains of dysuria. Pt is sexually active with 1 partner for the last 2 years. She does have mild pain with sexual intercourse. She was seen in the ED 3 days ago for the same pain and was diagnosed with PID. Pt states that she was told to return if pain persists, prompting her to come to the ED today. She has been taking Ibuprofen without relief. Denies rectal pain, vaginal discharge, or any other associated symptoms.   The history is provided by the patient. No language interpreter was used.    Past Medical History:  Diagnosis Date  . Abdominal pain, chronic, epigastric   . Abdominal pain, chronic, right lower quadrant   . Bulging disc   . Constipation   . Contraceptive management 07/18/2014  . Disk prolapse   . Elevated liver enzymes 12/01/2013  . Gastritis   . GERD (gastroesophageal reflux disease)   . Headache   . Herpes   . Mental disorder    anxiety  . Obesity   . Other and unspecified ovarian cyst 12/01/2013  . Ovarian cyst, right     Patient Active Problem List   Diagnosis Date Noted  . Constipation 01/01/2016  . Abdominal pain 01/01/2016  . Obesity 07/25/2015  . Vomiting and diarrhea 11/11/2014  . Acute gastroenteritis 11/11/2014  . UTI (lower urinary tract infection) 11/11/2014  . Hypokalemia 11/11/2014  .  Morbid obesity (HCC) 11/11/2014  . Contraceptive management 07/18/2014  . Abdominal pain, acute 06/08/2014  . Nausea & vomiting 06/08/2014  . Dehydration 06/08/2014  . Intractable nausea and vomiting 06/08/2014  . Gastritis   . Abdominal pain, chronic, epigastric   . Abdominal pain, chronic, right lower quadrant musculoskeletal 02/06/2014  . Other and unspecified ovarian cyst 12/05/2013  . Other and unspecified ovarian cyst 12/01/2013  . Elevated liver enzymes 12/01/2013  . Internal hemorrhoids with other complication 11/23/2013  . Unspecified constipation 08/07/2013  . Abdominal pain, other specified site 02/03/2013  . Rectal bleeding 02/03/2013  . Lumbar pain 07/14/2012  . Lumbar radiculopathy 07/14/2012  . Nausea and vomiting 01/11/2012  . GERD (gastroesophageal reflux disease) 01/11/2012  . KNEE PAIN 06/13/2009  . CONTUSION, LEFT KNEE 06/13/2009    Past Surgical History:  Procedure Laterality Date  . COLONOSCOPY WITH ESOPHAGOGASTRODUODENOSCOPY (EGD) N/A 02/24/2013   [id number]:[id number] RECTAL BLEEDING DUE TO Moderate sized internal hemorrhoids, EGD: erosive esophagitis due to uncontrolled reflux, moderate erosive gastritis, benign path  . ESOPHAGOGASTRODUODENOSCOPY  01/19/2012   OZH:YQMVHQ,IONGEXBM IN THE ANTRUM/HIATAL HERNIA/  . WISDOM TOOTH EXTRACTION      OB History    Gravida Para Term Preterm AB Living   1 1 1     1    SAB TAB Ectopic Multiple Live Births  Home Medications    Prior to Admission medications   Medication Sig Start Date End Date Taking? Authorizing Provider  doxycycline (VIBRAMYCIN) 100 MG capsule Take 1 capsule (100 mg total) by mouth 2 (two) times daily. 06/11/16 06/25/16 Yes Shaune Pollack, MD  DULoxetine (CYMBALTA) 60 MG capsule Take 60 mg by mouth at bedtime.   Yes Historical Provider, MD  FIBER, CORN DEXTRIN, PO Take 1 tablet by mouth daily.   Yes Historical Provider, MD  ibuprofen (ADVIL,MOTRIN) 400 MG tablet Take 1 tablet (400 mg  total) by mouth every 8 (eight) hours as needed for moderate pain. 06/11/16  Yes Shaune Pollack, MD  linaclotide [facility]) 145 MCG CAPS capsule Take 145 mcg by mouth daily before breakfast.   Yes Historical Provider, MD  LYRICA 150 MG capsule Take 150 mg by mouth 2 (two) times daily. 07/29/15  Yes Historical Provider, MD  medroxyPROGESTERone (DEPO-PROVERA) 150 MG/ML injection Inject 1 mL (150 mg total) into the muscle every 3 (three) months. 07/25/15  Yes Adline Potter, NP  Melatonin 5 MG TABS Take 1 tablet by mouth at bedtime.   Yes Historical Provider, MD  metroNIDAZOLE (FLAGYL) 500 MG tablet Take 1 tablet (500 mg total) by mouth 2 (two) times daily. 06/11/16  Yes Shaune Pollack, MD  ondansetron (ZOFRAN) 4 MG tablet Take 1 tablet (4 mg total) by mouth every 8 (eight) hours as needed for nausea or vomiting. 06/11/16  Yes Shaune Pollack, MD  pantoprazole (PROTONIX) 40 MG tablet Take 1 tablet (40 mg total) by mouth 2 (two) times daily before a meal. 01/01/16  Yes Anice Paganini, NP  tiZANidine (ZANAFLEX) 2 MG tablet Take 2 mg by mouth every 8 (eight) hours as needed for muscle spasms.  05/21/16  Yes Historical Provider, MD  topiramate (TOPAMAX) 100 MG tablet Take 300 mg by mouth daily. 07/29/15  Yes Historical Provider, MD  traMADol (ULTRAM) 50 MG tablet Take 1 tablet (50 mg total) by mouth every 6 (six) hours as needed. Patient taking differently: Take 50 mg by mouth every 6 (six) hours as needed for moderate pain.  05/05/16  Yes Ivery Quale, PA-C  valACYclovir (VALTREX) 1000 MG tablet Take 1 tablet (1,000 mg total) by mouth daily. 07/25/15  Yes Adline Potter, NP  cephALEXin (KEFLEX) 500 MG capsule Take 1 capsule (500 mg total) by mouth 4 (four) times daily. 06/14/16 06/19/16  Eber Hong, MD  dexamethasone (DECADRON) 4 MG tablet Take 1 tablet (4 mg total) by mouth 2 (two) times daily with a meal. Patient not taking: Reported on 06/11/2016 05/05/16   Ivery Quale, PA-C  diclofenac (VOLTAREN) 50 MG EC tablet  Take 50 mg by mouth 2 (two) times daily. 05/21/16   Historical Provider, MD  diclofenac (VOLTAREN) 75 MG EC tablet Take 1 tablet (75 mg total) by mouth 2 (two) times daily. Take with food Patient not taking: Reported on 06/11/2016 04/27/16   Pauline Aus, PA-C    Family History Family History  Problem Relation Age of Onset  . GER disease Mother   . Other Mother     diverticulitis; hernia  . Diabetes Mother     borderline  . Diabetes Maternal Grandmother   . COPD Maternal Grandmother   . Diabetes Maternal Grandfather   . Congestive Heart Failure Maternal Grandfather   . Headache Son   . Diabetes Other   . Colon cancer Neg Hx   . Colon polyps Neg Hx     Social History Social History  Substance Use  Topics  . Smoking status: Former Smoker    Packs/day: 0.25    Years: 0.50    Types: Cigarettes    Quit date: 07/28/2011  . Smokeless tobacco: Never Used  . Alcohol use No     Allergies   Naproxen   Review of Systems Review of Systems  Gastrointestinal: Negative for rectal pain.  Genitourinary: Positive for dysuria and vaginal pain. Negative for vaginal discharge.  All other systems reviewed and are negative.  Physical Exam Updated Vital Signs BP 120/87 (BP Location: Left Arm)   Pulse 109   Temp 98 F (36.7 C) (Oral)   Resp 16   Ht 5\' 6"  (1.676 m)   Wt (!) 308 lb (139.7 kg)   LMP 04/26/2016 Comment: depo inj  SpO2 96%   BMI 49.71 kg/m   Physical Exam  Constitutional: She appears well-developed and well-nourished. No distress.  HENT:  Head: Normocephalic and atraumatic.  Mouth/Throat: Oropharynx is clear and moist. No oropharyngeal exudate.  Eyes: Conjunctivae and EOM are normal. Pupils are equal, round, and reactive to light. Right eye exhibits no discharge. Left eye exhibits no discharge. No scleral icterus.  Neck: Normal range of motion. Neck supple. No JVD present. No thyromegaly present.  Cardiovascular: Normal rate, regular rhythm, normal heart sounds and  intact distal pulses.  Exam reveals no gallop and no friction rub.   No murmur heard. Pulmonary/Chest: Effort normal and breath sounds normal. No respiratory distress. She has no wheezes. She has no rales.  Abdominal: Soft. Bowel sounds are normal. She exhibits no distension and no mass. There is no tenderness.  Genitourinary:  Genitourinary Comments: Chaperone present. Normal appearing external genitalia. Speculum deferred. Bimanual with urethral tenderness. No lesions. No ulcers. No discharge. No bleeding. No adnexal tenderness or masse. No foul smell. No foreign bodies.   Musculoskeletal: Normal range of motion. She exhibits no edema or tenderness.  Lymphadenopathy:    She has no cervical adenopathy.  Neurological: She is alert. Coordination normal.  Skin: Skin is warm and dry. No rash noted. No erythema.  Psychiatric: She has a normal mood and affect. Her behavior is normal.  Nursing note and vitals reviewed.    ED Treatments / Results   DIAGNOSTIC STUDIES: Oxygen Saturation is 96% on RA, adequate by my interpretation.    COORDINATION OF CARE: 1:06 PM-Discussed treatment plan which includes UA with pt at bedside and pt agreed to plan.   Labs (all labs ordered are listed, but only abnormal results are displayed) Labs Reviewed  URINALYSIS, ROUTINE W REFLEX MICROSCOPIC (NOT AT [facility]) - Abnormal; Notable for the following:       Result Value   Specific Gravity, Urine >1.030 (*)    Ketones, ur TRACE (*)    Protein, ur TRACE (*)    Leukocytes, UA TRACE (*)    All other components within normal limits  URINE MICROSCOPIC-ADD ON - Abnormal; Notable for the following:    Squamous Epithelial / LPF 0-5 (*)    Bacteria, UA FEW (*)    Crystals CA OXALATE CRYSTALS (*)    All other components within normal limits  URINE CULTURE  PREGNANCY, URINE     Radiology No results found.  Procedures Procedures (including critical care time)  Medications Ordered in ED Medications    cephALEXin (KEFLEX) capsule 500 mg (not administered)     Initial Impression / Assessment and Plan / ED Course  I have reviewed the triage vital signs and the nursing notes.  Pertinent labs &  imaging results that were available during my care of the patient were reviewed by me and considered in my medical decision making (see chart for details).  Clinical Course  Comment By Time  Few bacteria, calcium oxalate crystals, no blood, trace leukocytes.  Tx as if UTI as she has some urethritis and already underwent STD treatment.  Pt will need GYN f/u - she is agreeable. Eber Hong, MD 07/30 1431    I personally performed the services described in this documentation, which was scribed in my presence. The recorded information has been reviewed and is accurate.      Final Clinical Impressions(s) / ED Diagnoses   Final diagnoses:  Urethritis    New Prescriptions New Prescriptions   CEPHALEXIN (KEFLEX) 500 MG CAPSULE    Take 1 capsule (500 mg total) by mouth 4 (four) times daily.     Eber Hong, MD 06/14/16 [phone]

## 2016-06-14 NOTE — Discharge Instructions (Signed)
You will need to see your GYnecologist for further evaluation if this doesn't improve  Keflex 4 times a day for 5 days for possible urine infecction (culture pending)

## 2016-06-15 ENCOUNTER — Emergency Department (HOSPITAL_COMMUNITY)
Admission: EM | Admit: 2016-06-15 | Discharge: 2016-06-15 | Disposition: A | Discharge status: Home / Self Care | Payer: Medicaid Other | Attending: Emergency Medicine | Admitting: Emergency Medicine

## 2016-06-15 ENCOUNTER — Encounter (HOSPITAL_COMMUNITY): Payer: Self-pay | Admitting: Emergency Medicine

## 2016-06-15 ENCOUNTER — Ambulatory Visit: Payer: Medicaid Other | Admitting: Podiatry

## 2016-06-15 DIAGNOSIS — R300 Dysuria: Secondary | ICD-10-CM | POA: Insufficient documentation

## 2016-06-15 DIAGNOSIS — Z87891 Personal history of nicotine dependence: Secondary | ICD-10-CM | POA: Diagnosis not present

## 2016-06-15 DIAGNOSIS — R110 Nausea: Secondary | ICD-10-CM | POA: Insufficient documentation

## 2016-06-15 DIAGNOSIS — R102 Pelvic and perineal pain: Secondary | ICD-10-CM | POA: Diagnosis present

## 2016-06-15 MED ORDER — PHENAZOPYRIDINE HCL 200 MG PO TABS: 200.0000 mg | ORAL_TABLET | Freq: Three times a day (TID) | ORAL | 0 refills | Status: DC

## 2016-06-15 NOTE — ED Provider Notes (Signed)
[facility] DEPT Provider Note   CSN: [id number] Arrival date & time: 06/15/16  1823  By signing my name below, I, [name], attest that this documentation has been prepared under the direction and in the presence of [place], [place]. Electronically Signed: Alyssa Harrington, ED Scribe. 06/15/16. 7:30 PM.  First MD Initiated Contact with Patient 06/15/16 1900      History   Chief Complaint Chief Complaint  Patient presents with  . Vaginal Pain    The history is provided by the patient. No language interpreter was used.    HPI Comments: [name] is a 30 y.o. female who presents to the Emergency Department complaining of continued vaginal discomfort onset 1 day. Pt was seen 1 day ago for the same symptoms and received Keflex for probable UTI.  She was also seen on 06/11/16 for same and treated for PID.  Pt states the pain is getting worse to the point she has trouble sitting. Pt states she had the Keflex prescription filled yesterday. She has taken 3 doses of Keflex today and received 1 dose before she left the ED 1 day ago. Pt describes the pain as a sharp pain. She states she felt nauseated yesterday and reports associated burning with urination. Pt states vaginal area does not itch. She reports having urinary frequency.  Pt reports the Ibuprofen she has been taking has not relieved the pain. Pt denies back pain, abdominal pain, fever and vomiting.  Pt states she has an appointment with the OB/GYN on 06/19/16.   Past Medical History:  Diagnosis Date  . Abdominal pain, chronic, epigastric   . Abdominal pain, chronic, right lower quadrant   . Bulging disc   . Constipation   . Contraceptive management 07/18/2014  . Disk prolapse   . Elevated liver enzymes 12/01/2013  . Gastritis   . GERD (gastroesophageal reflux disease)   . Headache   . Herpes   . Mental disorder    anxiety  . Obesity   . Other and unspecified ovarian cyst 12/01/2013  . Ovarian cyst, right     Patient  Active Problem List   Diagnosis Date Noted  . Constipation 01/01/2016  . Abdominal pain 01/01/2016  . Obesity 07/25/2015  . Vomiting and diarrhea 11/11/2014  . Acute gastroenteritis 11/11/2014  . UTI (lower urinary tract infection) 11/11/2014  . Hypokalemia 11/11/2014  . Morbid obesity (HCC) 11/11/2014  . Contraceptive management 07/18/2014  . Abdominal pain, acute 06/08/2014  . Nausea & vomiting 06/08/2014  . Dehydration 06/08/2014  . Intractable nausea and vomiting 06/08/2014  . Gastritis   . Abdominal pain, chronic, epigastric   . Abdominal pain, chronic, right lower quadrant musculoskeletal 02/06/2014  . Other and unspecified ovarian cyst 12/05/2013  . Other and unspecified ovarian cyst 12/01/2013  . Elevated liver enzymes 12/01/2013  . Internal hemorrhoids with other complication 11/23/2013  . Unspecified constipation 08/07/2013  . Abdominal pain, other specified site 02/03/2013  . Rectal bleeding 02/03/2013  . Lumbar pain 07/14/2012  . Lumbar radiculopathy 07/14/2012  . Nausea and vomiting 01/11/2012  . GERD (gastroesophageal reflux disease) 01/11/2012  . KNEE PAIN 06/13/2009  . CONTUSION, LEFT KNEE 06/13/2009    Past Surgical History:  Procedure Laterality Date  . COLONOSCOPY WITH ESOPHAGOGASTRODUODENOSCOPY (EGD) N/A 02/24/2013   [id number] RECTAL BLEEDING DUE TO Moderate sized internal hemorrhoids, EGD: erosive esophagitis due to uncontrolled reflux, moderate erosive gastritis, benign path  . ESOPHAGOGASTRODUODENOSCOPY  01/19/2012   FBP:PHKFEX,[id number] IN THE ANTRUM/HIATAL HERNIA/  . WISDOM TOOTH EXTRACTION  OB History    Gravida Para Term Preterm AB Living   1 1 1     1    SAB TAB Ectopic Multiple Live Births                   Home Medications    Prior to Admission medications   Medication Sig Start Date End Date Taking? Authorizing Provider  cephALEXin (KEFLEX) 500 MG capsule Take 1 capsule (500 mg total) by mouth 4 (four) times daily. 06/14/16  06/19/16  Eber Hong, MD  dexamethasone (DECADRON) 4 MG tablet Take 1 tablet (4 mg total) by mouth 2 (two) times daily with a meal. Patient not taking: Reported on 06/11/2016 05/05/16   Ivery Quale, PA-C  diclofenac (VOLTAREN) 50 MG EC tablet Take 50 mg by mouth 2 (two) times daily. 05/21/16   Historical Provider, MD  diclofenac (VOLTAREN) 75 MG EC tablet Take 1 tablet (75 mg total) by mouth 2 (two) times daily. Take with food Patient not taking: Reported on 06/11/2016 04/27/16   Patrecia Veiga, PA-C  doxycycline (VIBRAMYCIN) 100 MG capsule Take 1 capsule (100 mg total) by mouth 2 (two) times daily. 06/11/16 06/25/16  Shaune Pollack, MD  DULoxetine (CYMBALTA) 60 MG capsule Take 60 mg by mouth at bedtime.    Historical Provider, MD  FIBER, CORN DEXTRIN, PO Take 1 tablet by mouth daily.    Historical Provider, MD  ibuprofen (ADVIL,MOTRIN) 400 MG tablet Take 1 tablet (400 mg total) by mouth every 8 (eight) hours as needed for moderate pain. 06/11/16   Shaune Pollack, MD  linaclotide [facility]) 145 MCG CAPS capsule Take 145 mcg by mouth daily before breakfast.    Historical Provider, MD  LYRICA 150 MG capsule Take 150 mg by mouth 2 (two) times daily. 07/29/15   Historical Provider, MD  medroxyPROGESTERone (DEPO-PROVERA) 150 MG/ML injection Inject 1 mL (150 mg total) into the muscle every 3 (three) months. 07/25/15   Adline Potter, NP  Melatonin 5 MG TABS Take 1 tablet by mouth at bedtime.    Historical Provider, MD  metroNIDAZOLE (FLAGYL) 500 MG tablet Take 1 tablet (500 mg total) by mouth 2 (two) times daily. 06/11/16   Shaune Pollack, MD  ondansetron (ZOFRAN) 4 MG tablet Take 1 tablet (4 mg total) by mouth every 8 (eight) hours as needed for nausea or vomiting. 06/11/16   Shaune Pollack, MD  pantoprazole (PROTONIX) 40 MG tablet Take 1 tablet (40 mg total) by mouth 2 (two) times daily before a meal. 01/01/16   Anice Paganini, NP  tiZANidine (ZANAFLEX) 2 MG tablet Take 2 mg by mouth every 8 (eight) hours as needed  for muscle spasms.  05/21/16   Historical Provider, MD  topiramate (TOPAMAX) 100 MG tablet Take 300 mg by mouth daily. 07/29/15   Historical Provider, MD  traMADol (ULTRAM) 50 MG tablet Take 1 tablet (50 mg total) by mouth every 6 (six) hours as needed. Patient taking differently: Take 50 mg by mouth every 6 (six) hours as needed for moderate pain.  05/05/16   Ivery Quale, PA-C  valACYclovir (VALTREX) 1000 MG tablet Take 1 tablet (1,000 mg total) by mouth daily. 07/25/15   Adline Potter, NP    Family History Family History  Problem Relation Age of Onset  . GER disease Mother   . Other Mother     diverticulitis; hernia  . Diabetes Mother     borderline  . Diabetes Maternal Grandmother   . COPD Maternal Grandmother   .  Diabetes Maternal Grandfather   . Congestive Heart Failure Maternal Grandfather   . Headache Son   . Diabetes Other   . Colon cancer Neg Hx   . Colon polyps Neg Hx     Social History Social History  Substance Use Topics  . Smoking status: Former Smoker    Packs/day: 0.25    Years: 0.50    Types: Cigarettes    Quit date: 07/28/2011  . Smokeless tobacco: Never Used  . Alcohol use No     Allergies   Naproxen   Review of Systems Review of Systems  Constitutional: Negative for fever.  Gastrointestinal: Positive for nausea. Negative for abdominal pain.  Genitourinary: Positive for dysuria, frequency and vaginal pain.  Musculoskeletal: Negative for back pain.  All other systems reviewed and are negative.    Physical Exam Updated Vital Signs BP 114/80 (BP Location: Left Arm)   Pulse 100   Temp 97.8 F (36.6 C) (Oral)   Resp 16   Ht 5\' 6"  (1.676 m)   Wt (!) 308 lb (139.7 kg)   LMP 06/01/2016 Comment: depo inj  SpO2 100%   BMI 49.71 kg/m   Physical Exam  Constitutional: She appears well-developed and well-nourished.  HENT:  Head: Normocephalic.  Eyes: Conjunctivae are normal.  Cardiovascular: Normal rate, regular rhythm and normal heart  sounds.  Exam reveals no gallop and no friction rub.   No murmur heard. Pulmonary/Chest: Effort normal and breath sounds normal. No respiratory distress. She has no wheezes. She has no rales.  Abdominal: Soft. Bowel sounds are normal. She exhibits no distension and no mass. There is no tenderness. There is no rebound and no guarding.  Genitourinary:  Genitourinary Comments: Pelvic exam deferred, had previous pelvic exam 1 day ago  Musculoskeletal: Normal range of motion.  Neurological: She is alert.  Skin: Skin is warm and dry.  Psychiatric: She has a normal mood and affect. Her behavior is normal.  Nursing note and vitals reviewed.    ED Treatments / Results  DIAGNOSTIC STUDIES: Oxygen Saturation is 100% on RA, normal by my interpretation.    COORDINATION OF CARE: 7:03 PM Discussed treatment plan with pt at bedside which includes Pyridium and pt agreed to plan.   Labs (all labs ordered are listed, but only abnormal results are displayed) Labs Reviewed  URINALYSIS, ROUTINE W REFLEX MICROSCOPIC (NOT AT [facility])  PREGNANCY, URINE    EKG  EKG Interpretation None       Radiology No results found.  Procedures Procedures (including critical care time)  Medications Ordered in ED Medications - No data to display   Initial Impression / Assessment and Plan / ED Course  I have reviewed the triage vital signs and the nursing notes.  Pertinent labs & imaging results that were available during my care of the patient were reviewed by me and considered in my medical decision making (see chart for details).  Clinical Course    Pt is well appearing.  Seen here on two other occasions for same.  Returns requesting pain control for dysuria.  Agrees to continue ibuprofen and I will prescribe pyridium.    Final Clinical Impressions(s) / ED Diagnoses   Final diagnoses:  Dysuria    New Prescriptions Discharge Medication List as of 06/15/2016  7:09 PM    START taking these  medications   Details  phenazopyridine (PYRIDIUM) 200 MG tablet Take 1 tablet (200 mg total) by mouth 3 (three) times daily., Starting Mon 06/15/2016, Print  I personally performed the services described in this documentation, which was scribed in my presence. The recorded information has been reviewed and is accurate.'    Pauline Aus, PA-C 06/17/16 2133    Samuel Jester, DO 06/18/16 3086

## 2016-06-15 NOTE — ED Triage Notes (Signed)
PT c/o continued vaginal pain and discomfort with recent dx of PID. PT states she has a follow-up appointment for this Friday at [facility]. PT also states burning with urination.

## 2016-06-15 NOTE — Discharge Instructions (Signed)
Continue taking your Keflex as directed.  You can use a small amt of OTC Vagasil cream to the vaginal area as directed, but do not apply internally.  Keep your GYN appt for this Friday

## 2016-06-16 LAB — URINE CULTURE: CULTURE: NO GROWTH

## 2016-06-19 ENCOUNTER — Ambulatory Visit (INDEPENDENT_AMBULATORY_CARE_PROVIDER_SITE_OTHER): Payer: Medicaid Other | Admitting: Adult Health

## 2016-06-19 ENCOUNTER — Encounter: Payer: Self-pay | Admitting: Adult Health

## 2016-06-19 VITALS — BP 102/64 | HR 98 | Ht 66.0 in | Wt 325.5 lb

## 2016-06-19 DIAGNOSIS — R102 Pelvic and perineal pain: Secondary | ICD-10-CM

## 2016-06-19 DIAGNOSIS — R300 Dysuria: Secondary | ICD-10-CM

## 2016-06-19 DIAGNOSIS — R309 Painful micturition, unspecified: Secondary | ICD-10-CM | POA: Diagnosis not present

## 2016-06-19 LAB — POCT URINALYSIS DIPSTICK
Blood, UA: NEGATIVE
Glucose, UA: NEGATIVE
Ketones, UA: NEGATIVE
LEUKOCYTES UA: NEGATIVE
NITRITE UA: POSITIVE
PROTEIN UA: NEGATIVE

## 2016-06-19 MED ORDER — METRONIDAZOLE 0.75 % VA GEL: 1.0000 | Freq: Two times a day (BID) | VAGINAL | 1 refills | Status: DC

## 2016-06-19 NOTE — Patient Instructions (Signed)
Drink apple juice, cranberry or lemonade Push water  Use metogel  Follow up prn

## 2016-06-19 NOTE — Progress Notes (Signed)
Subjective:     Patient ID: [name], female   DOB: 1985-12-17, 30 y.o.   MRN: [id number]  HPI [name] is a 30 year old white female in complaining of burning with urination and pain with urination ad vaginal pain and burning, she has been seen in the ER recently for this and is on keflex,pyridium and doxycycline. She is on depo and it is due next week.She had negative GC/CHL in ER and urine culture showed no growth then too.  Review of Systems Patient denies any headaches, hearing loss, fatigue, blurred vision, shortness of breath, chest pain, abdominal pain, problems with bowel movements,or intercourse. No joint pain or mood swings.See HPI for positives. Reviewed past medical,surgical, social and family history. Reviewed medications and allergies.     Objective:   Physical Exam BP 102/64 (BP Location: Left Arm, Patient Position: Sitting, Cuff Size: Large)   Pulse 98   Ht 5\' 6"  (1.676 m)   Wt (!) 325 lb 8 oz (147.6 kg)   LMP 06/01/2016 Comment: depo inj  BMI 52.54 kg/m urine + nitrates Skin warm and dry.Pelvic: external genitalia is normal in appearance no lesions, vagina: scant discharge without odor,urethra has no lesions or masses noted, cervix:smooth and bulbous, uterus: normal size, shape and contour, non tender, no masses felt, adnexa: no masses or tenderness noted. Bladder is non tender and no masses felt. Abdomen is soft and non tender    Assessment:     Burning with urination  Pain with urination Vaginal pain     Plan:     UA C&S sent Finish keflex and doxycycline Push water and try apple,or cranberry juice and lemonade Rx metrogel use 1 applicator in vagina bid with 1 refill Follow up prn

## 2016-06-20 LAB — MICROSCOPIC EXAMINATION: Casts: NONE SEEN /lpf

## 2016-06-20 LAB — URINALYSIS, ROUTINE W REFLEX MICROSCOPIC
Bilirubin, UA: NEGATIVE
Glucose, UA: NEGATIVE
Nitrite, UA: POSITIVE — AB
RBC, UA: NEGATIVE
Urobilinogen, Ur: 1 mg/dL (ref 0.2–1.0)
pH, UA: 5.5 (ref 5.0–7.5)

## 2016-06-21 LAB — URINE CULTURE: Organism ID, Bacteria: NO GROWTH

## 2016-06-26 ENCOUNTER — Encounter: Payer: Self-pay | Admitting: *Deleted

## 2016-06-26 ENCOUNTER — Ambulatory Visit (INDEPENDENT_AMBULATORY_CARE_PROVIDER_SITE_OTHER): Payer: Medicaid Other | Admitting: *Deleted

## 2016-06-26 DIAGNOSIS — Z3042 Encounter for surveillance of injectable contraceptive: Secondary | ICD-10-CM | POA: Diagnosis not present

## 2016-06-26 DIAGNOSIS — Z3202 Encounter for pregnancy test, result negative: Secondary | ICD-10-CM

## 2016-06-26 LAB — POCT URINE PREGNANCY: PREG TEST UR: NEGATIVE

## 2016-06-26 MED ORDER — MEDROXYPROGESTERONE ACETATE 150 MG/ML IM SUSP: 150.0000 mg | INTRAMUSCULAR | 3 refills | Status: DC

## 2016-06-26 MED ORDER — MEDROXYPROGESTERONE ACETATE 150 MG/ML IM SUSP
150.0000 mg | Freq: Once | INTRAMUSCULAR | Status: AC
  Administered 2016-06-26: 150 mg via INTRAMUSCULAR

## 2016-06-26 NOTE — Progress Notes (Signed)
Depo Provera 150 mg IM given right deltoid with no complications, negative pregnancy test. Pt to return in 12 weeks for next injection.

## 2016-06-30 ENCOUNTER — Ambulatory Visit: Payer: Medicaid Other | Admitting: Nurse Practitioner

## 2016-07-13 ENCOUNTER — Ambulatory Visit (INDEPENDENT_AMBULATORY_CARE_PROVIDER_SITE_OTHER): Payer: Medicaid Other | Admitting: Podiatry

## 2016-07-13 ENCOUNTER — Encounter: Payer: Self-pay | Admitting: Podiatry

## 2016-07-13 VITALS — BP 96/67 | HR 87 | Resp 16

## 2016-07-13 DIAGNOSIS — M722 Plantar fascial fibromatosis: Secondary | ICD-10-CM

## 2016-07-14 NOTE — Progress Notes (Signed)
Subjective:     Patient ID: [name], female   DOB: March 14, 1986, 30 y.o.   MRN: [id number]  HPI patient states she still getting pain in her heel but worse when she gets up in the morning or after sitting   Review of Systems     Objective:   Physical Exam Neurovascular status intact with discomfort in the plantar heel right    Assessment:     Advised on physical therapy anti-inflammatories and at this time went ahead and dispensed a night splint with all instructions on usage and will be seen back for us to review    Plan:     Continue plantar fasciitis

## 2016-07-19 ENCOUNTER — Other Ambulatory Visit: Payer: Self-pay | Admitting: Adult Health

## 2016-07-28 ENCOUNTER — Encounter: Payer: Self-pay | Admitting: Nurse Practitioner

## 2016-07-28 ENCOUNTER — Other Ambulatory Visit: Payer: Self-pay

## 2016-07-28 ENCOUNTER — Ambulatory Visit (INDEPENDENT_AMBULATORY_CARE_PROVIDER_SITE_OTHER): Payer: Medicaid Other | Admitting: Nurse Practitioner

## 2016-07-28 VITALS — BP 122/81 | HR 91 | Temp 97.8°F | Ht 66.0 in | Wt 325.6 lb

## 2016-07-28 DIAGNOSIS — K219 Gastro-esophageal reflux disease without esophagitis: Secondary | ICD-10-CM

## 2016-07-28 DIAGNOSIS — R100 Acute abdomen: Secondary | ICD-10-CM

## 2016-07-28 DIAGNOSIS — K5900 Constipation, unspecified: Secondary | ICD-10-CM

## 2016-07-28 DIAGNOSIS — R109 Unspecified abdominal pain: Secondary | ICD-10-CM

## 2016-07-28 DIAGNOSIS — R634 Abnormal weight loss: Secondary | ICD-10-CM

## 2016-07-28 MED ORDER — LIDOCAINE VISCOUS 2 % MT SOLN: 15.0000 mL | Freq: Four times a day (QID) | OROMUCOSAL | 1 refills | Status: DC | PRN

## 2016-07-28 NOTE — Assessment & Plan Note (Signed)
Abdominal pain resolved. Continue to monitor. Return for follow-up in 4 months.

## 2016-07-28 NOTE — Patient Instructions (Signed)
1. We'll schedule your procedure for you. 2. I sent in viscous lidocaine. You can take this up to 3-4 times a day as needed for burning symptoms. 3. Continue taking her Protonix twice a day. 4. We will refer you to the dietitian/nutritionist for weight loss counseling. 5. Return for follow-up in 4 months.

## 2016-07-28 NOTE — Progress Notes (Signed)
CC'ED TO PCP 

## 2016-07-28 NOTE — Progress Notes (Addendum)
REVIEWED-PT HAS HAD EGD 2013, 2014, AND 2017. According to [facility] GUIDELINES 2013, NO EGD INDICATED UNLESS PT HAS DYSPHAGIA, HEMATEMESIS, ANEMIA, WEIGHT LOSS, OR RECURRENT VOMITING.   Referring Provider: Pearson Grippe, MD Primary Care Physician:  Pearson Grippe, MD Primary GI:  Dr. Darrick Penna  Chief Complaint  Patient presents with  . Follow-up    doing ok    HPI:   [name] is a 29 y.o. female who presents for follow-up on constipation and GERD. She was last seen in our office 03/30/2016 for the same at which point she stated she had been doing well until she was seen the week prior in the emergency room. She was given a prescription for naproxen which she did not fill because of history of esophagitis. Has not been taking MiraLAX, but has been taking Dulcolax stool softener. Noted minimal to no water intake, minimal fiber, no fiber supplement as recommended. Requesting a prescription medication for constipation. Abdominal pain somewhat improved. Etiology of abdominal pain deemed likely continued constipation with emergency room visit and imaging unremarkable other than moderate stool burden throughout the colon. She was started on Linzess 145 g and requested to call with a two-week progress report. GERD symptoms well controlled on Protonix. Follow-up in 3 months.  Call with a progress report noting loose stools on Linzess 145 g. Her dose is decreased to 72 g and more samples provided. No repeat progress report on the decreased dose.  Today she states she's doing ok overall. Constipation resolved on linzess 72 mcg. She would like a prescription called to her pharmacy for it. Is taking Protonix daily and is having some recurrent/breakthrough GERD symptoms daily. Symptoms started getting worse about a month ago. Denies NSAIDs and ASA powder use. Symptoms include upper esophageal burning, no bitter/sour taste, no epigastric pain. EGD last done 3 years ago with uncontrolled GERD, gastritis due to  NSAIDs. At that time recommended Protonix bid. Denies hematochezia, melena. Denies chest pain, dyspnea, dizziness, lightheadedness, syncope, near syncope. Denies any other upper or lower GI symptoms.  Past Medical History:  Diagnosis Date  . Abdominal pain, chronic, epigastric   . Abdominal pain, chronic, right lower quadrant   . Bulging disc   . Constipation   . Contraceptive management 07/18/2014  . Disk prolapse   . Elevated liver enzymes 12/01/2013  . Gastritis   . GERD (gastroesophageal reflux disease)   . Headache   . Herpes   . Mental disorder    anxiety  . Obesity   . Other and unspecified ovarian cyst 12/01/2013  . Ovarian cyst, right     Past Surgical History:  Procedure Laterality Date  . COLONOSCOPY WITH ESOPHAGOGASTRODUODENOSCOPY (EGD) N/A 02/24/2013   [id number] RECTAL BLEEDING DUE TO Moderate sized internal hemorrhoids, EGD: erosive esophagitis due to uncontrolled reflux, moderate erosive gastritis, benign path  . ESOPHAGOGASTRODUODENOSCOPY  01/19/2012   FAO:ZHYQMV,HQIONGEX IN THE ANTRUM/HIATAL HERNIA/  . WISDOM TOOTH EXTRACTION      Current Outpatient Prescriptions  Medication Sig Dispense Refill  . diclofenac (VOLTAREN) 50 MG EC tablet Take 50 mg by mouth 2 (two) times daily.  2  . DULoxetine (CYMBALTA) 60 MG capsule Take 60 mg by mouth at bedtime.    Marland Kitchen FIBER, CORN DEXTRIN, PO Take 1 tablet by mouth daily.    Marland Kitchen linaclotide (LINZESS) 145 MCG CAPS capsule Take 145 mcg by mouth daily before breakfast.    . LYRICA 150 MG capsule Take 150 mg by mouth 2 (two) times daily.  2  .  medroxyPROGESTERone (DEPO-PROVERA) 150 MG/ML injection Inject 1 mL (150 mg total) into the muscle every 3 (three) months. 1 mL 4  . medroxyPROGESTERone (DEPO-PROVERA) 150 MG/ML injection Inject 1 mL (150 mg total) into the muscle every 3 (three) months. 1 mL 3  . Melatonin 5 MG TABS Take 1 tablet by mouth at bedtime.    . pantoprazole (PROTONIX) 40 MG tablet Take 1 tablet (40 mg total) by  mouth 2 (two) times daily before a meal. 60 tablet 11  . tiZANidine (ZANAFLEX) 2 MG tablet Take 2 mg by mouth every 8 (eight) hours as needed for muscle spasms.   2  . topiramate (TOPAMAX) 100 MG tablet Take 300 mg by mouth daily.  2  . traMADol (ULTRAM) 50 MG tablet Take 1 tablet (50 mg total) by mouth every 6 (six) hours as needed. (Patient taking differently: Take 50 mg by mouth every 6 (six) hours as needed for moderate pain. ) 15 tablet 0  . valACYclovir (VALTREX) 1000 MG tablet TAKE 1 TABLET BY MOUTH EVERY DAY 30 tablet 11   No current facility-administered medications for this visit.     Allergies as of 07/28/2016 - Review Complete 07/28/2016  Allergen Reaction Noted  . Naproxen Other (See Comments) 05/05/2016    Family History  Problem Relation Age of Onset  . GER disease Mother   . Other Mother     diverticulitis; hernia  . Diabetes Mother     borderline  . Diabetes Maternal Grandmother   . COPD Maternal Grandmother   . Diabetes Maternal Grandfather   . Congestive Heart Failure Maternal Grandfather   . Headache Son   . Diabetes Other   . Colon cancer Neg Hx   . Colon polyps Neg Hx     Social History   Social History  . Marital status: Single    Spouse name: N/A  . Number of children: 1  . 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 Smoking status: Former Smoker    Packs/day: 0.25    Years: 0.50    Types: Cigarettes    Quit date: 07/28/2011  . Smokeless tobacco: Never Used     Comment: stopped in 2009  . Alcohol use No  . Drug use: No  . Sexual activity: Not Currently    Birth control/ protection: Injection   Other Topics Concern  . None   Social History Narrative  . None    Review of Systems: 10-point ROS negative except as per HPI.   Physical Exam: BP 122/81   Pulse 91   Temp 97.8 F (36.6 C) (Oral)   Ht 5\' 6"  (1.676 m)   Wt (!) 325 lb 9.6 oz (147.7 kg)   LMP 07/26/2016  (Exact Date)   BMI 52.55 kg/m  General:   Morbidly obese female. Alert and oriented. Pleasant and cooperative. Well-nourished and well-developed.  Eyes:  Without icterus, sclera clear and conjunctiva pink.  Ears:  Normal auditory acuity. Cardiovascular:  S1, S2 present without murmurs appreciated. Extremities without clubbing or edema. Respiratory:  Clear to auscultation bilaterally. No wheezes, rales, or rhonchi. No distress.  Gastrointestinal:  +BS, obese but soft, non-tender and non-distended. No HSM noted. No guarding or rebound. No masses appreciated.  Rectal:  Deferred  Musculoskalatal:  Symmetrical without gross deformities. Neurologic:  Alert and oriented x4;  grossly normal neurologically. Psych:  Alert and cooperative. Normal mood and affect.  Heme/Lymph/Immune: No excessive bruising noted.    07/28/2016 11:15 AM   Disclaimer: This note was dictated with voice recognition software. Similar sounding words can inadvertently be transcribed and may not be corrected upon review.

## 2016-07-28 NOTE — Assessment & Plan Note (Signed)
She is having worsening and recurrent GERD symptoms. His currently on Protonix twice a day. Last EGD 3-1/2 years ago which noted worsening gastritis likely due to NSAID use. Denies any over-the-counter NSAIDs her ASA powders. Her med list was reviewed and she is on diclofenac which is a long-term NSAID. At this point we'll proceed with an upper endoscopy to evaluate for erosions or ulcerations as well as the status of her gastritis on twice a day Protonix. May need to change to Dexon daily. I will send in viscous lidocaine for symptomatic improvement while she waits her procedure.  Discussed the etiology of obesity and GERD. She agrees she needs to lose weight. I've offered to refer her to a dietitian/nutritionist which she has accepted. Return for follow-up in 4 months.

## 2016-07-28 NOTE — Assessment & Plan Note (Signed)
Constipation resolved on Linzess 72 g daily. Continue Linzess, return for follow-up in 4 months.

## 2016-07-31 ENCOUNTER — Encounter (HOSPITAL_COMMUNITY)
Admission: RE | Disposition: A | Discharge status: Home / Self Care | Payer: Self-pay | Source: Ambulatory Visit | Attending: Gastroenterology

## 2016-07-31 ENCOUNTER — Encounter (HOSPITAL_COMMUNITY): Payer: Self-pay | Admitting: *Deleted

## 2016-07-31 ENCOUNTER — Ambulatory Visit (HOSPITAL_COMMUNITY)
Admission: RE | Admit: 2016-07-31 | Discharge: 2016-07-31 | Disposition: A | Discharge status: Home / Self Care | Payer: Medicaid Other | Source: Ambulatory Visit | Attending: Gastroenterology | Admitting: Gastroenterology

## 2016-07-31 DIAGNOSIS — Z79899 Other long term (current) drug therapy: Secondary | ICD-10-CM | POA: Insufficient documentation

## 2016-07-31 DIAGNOSIS — K219 Gastro-esophageal reflux disease without esophagitis: Secondary | ICD-10-CM

## 2016-07-31 DIAGNOSIS — K5281 Eosinophilic gastritis or gastroenteritis: Secondary | ICD-10-CM | POA: Insufficient documentation

## 2016-07-31 DIAGNOSIS — Z87891 Personal history of nicotine dependence: Secondary | ICD-10-CM | POA: Insufficient documentation

## 2016-07-31 HISTORY — PX: ESOPHAGOGASTRODUODENOSCOPY: SHX5428

## 2016-07-31 SURGERY — EGD (ESOPHAGOGASTRODUODENOSCOPY)
Anesthesia: Moderate Sedation

## 2016-07-31 MED ORDER — LIDOCAINE VISCOUS 2 % MT SOLN
OROMUCOSAL | Status: AC
  Filled 2016-07-31: qty 15

## 2016-07-31 MED ORDER — MIDAZOLAM HCL 5 MG/5ML IJ SOLN
INTRAMUSCULAR | Status: DC | PRN
  Administered 2016-07-31: 2 mg via INTRAVENOUS
  Administered 2016-07-31: 1 mg via INTRAVENOUS
  Administered 2016-07-31: 2 mg via INTRAVENOUS

## 2016-07-31 MED ORDER — MEPERIDINE HCL 100 MG/ML IJ SOLN
INTRAMUSCULAR | Status: AC
  Filled 2016-07-31: qty 2

## 2016-07-31 MED ORDER — PROMETHAZINE HCL 25 MG/ML IJ SOLN
INTRAMUSCULAR | Status: AC
  Filled 2016-07-31: qty 1

## 2016-07-31 MED ORDER — SODIUM CHLORIDE 0.9 % IV SOLN
INTRAVENOUS | Status: DC
  Administered 2016-07-31: 09:00:00 via INTRAVENOUS

## 2016-07-31 MED ORDER — LIDOCAINE VISCOUS 2 % MT SOLN
OROMUCOSAL | Status: DC | PRN
  Administered 2016-07-31: 1 via OROMUCOSAL

## 2016-07-31 MED ORDER — SODIUM CHLORIDE 0.9% FLUSH
INTRAVENOUS | Status: AC
  Filled 2016-07-31: qty 10

## 2016-07-31 MED ORDER — MIDAZOLAM HCL 5 MG/5ML IJ SOLN
INTRAMUSCULAR | Status: AC
  Filled 2016-07-31: qty 10

## 2016-07-31 MED ORDER — PROMETHAZINE HCL 25 MG/ML IJ SOLN
25.0000 mg | Freq: Once | INTRAMUSCULAR | Status: AC
  Administered 2016-07-31: 25 mg via INTRAVENOUS

## 2016-07-31 MED ORDER — MEPERIDINE HCL 100 MG/ML IJ SOLN
INTRAMUSCULAR | Status: DC | PRN
  Administered 2016-07-31 (×2): 25 mg via INTRAVENOUS

## 2016-07-31 NOTE — H&P (Signed)
Primary Care Physician:  Pearson GrippeJames Kim, MD Primary Gastroenterologist:  Dr. Darrick PennaFields  Pre-Procedure History & Physical: HPI:  [name] is a 30 y.o. female here for UNCONTROLLED GERD.  Past Medical History:  Diagnosis Date  . Abdominal pain, chronic, epigastric   . Abdominal pain, chronic, right lower quadrant   . Bulging disc   . Constipation   . Contraceptive management 07/18/2014  . Disk prolapse   . Elevated liver enzymes 12/01/2013  . Gastritis   . GERD (gastroesophageal reflux disease)   . Headache   . Herpes   . Mental disorder    anxiety  . Obesity   . Other and unspecified ovarian cyst 12/01/2013  . Ovarian cyst, right     Past Surgical History:  Procedure Laterality Date  . COLONOSCOPY WITH ESOPHAGOGASTRODUODENOSCOPY (EGD) N/A 02/24/2013   [id number]:INTERMITTENT RECTAL BLEEDING DUE TO Moderate sized internal hemorrhoids, EGD: erosive esophagitis due to uncontrolled reflux, moderate erosive gastritis, benign path  . ESOPHAGOGASTRODUODENOSCOPY  01/19/2012   BJY:NWGNFA,[id number]:ULCERS,MULTIPLE IN THE ANTRUM/HIATAL HERNIA/  . WISDOM TOOTH EXTRACTION      Prior to Admission medications   Medication Sig Start Date End Date Taking? Authorizing Provider  diclofenac (VOLTAREN) 50 MG EC tablet Take 50 mg by mouth 2 (two) times daily. 05/21/16  Yes Historical Provider, MD  DULoxetine (CYMBALTA) 60 MG capsule Take 60 mg by mouth at bedtime.   Yes Historical Provider, MD  FIBER, CORN DEXTRIN, PO Take 1 tablet by mouth daily.   Yes Historical Provider, MD  linaclotide (LINZESS) 145 MCG CAPS capsule Take 145 mcg by mouth daily before breakfast.   Yes Historical Provider, MD  LYRICA 150 MG capsule Take 150 mg by mouth 2 (two) times daily. 07/29/15  Yes Historical Provider, MD  Melatonin 5 MG TABS Take 1 tablet by mouth at bedtime.   Yes Historical Provider, MD  pantoprazole (PROTONIX) 40 MG tablet Take 1 tablet (40 mg total) by mouth 2 (two) times daily before a meal. 01/01/16  Yes Anice PaganiniEric A Gill, NP   tiZANidine (ZANAFLEX) 2 MG tablet Take 2 mg by mouth every 8 (eight) hours as needed for muscle spasms.  05/21/16  Yes Historical Provider, MD  topiramate (TOPAMAX) 100 MG tablet Take 300 mg by mouth daily. 07/29/15  Yes Historical Provider, MD  traMADol (ULTRAM) 50 MG tablet Take 1 tablet (50 mg total) by mouth every 6 (six) hours as needed. Patient taking differently: Take 50 mg by mouth every 6 (six) hours as needed for moderate pain.  05/05/16  Yes Ivery QualeHobson Bryant, PA-C  valACYclovir (VALTREX) 1000 MG tablet TAKE 1 TABLET BY MOUTH EVERY DAY 07/21/16  Yes Adline PotterJennifer A Griffin, NP  lidocaine (XYLOCAINE) 2 % solution Use as directed 15 mLs in the mouth or throat every 6 (six) hours as needed for mouth pain. 07/28/16   Anice PaganiniEric A Gill, NP  medroxyPROGESTERone (DEPO-PROVERA) 150 MG/ML injection Inject 1 mL (150 mg total) into the muscle every 3 (three) months. 07/25/15   Adline PotterJennifer A Griffin, NP  medroxyPROGESTERone (DEPO-PROVERA) 150 MG/ML injection Inject 1 mL (150 mg total) into the muscle every 3 (three) months. 06/26/16   Lazaro ArmsLuther H Eure, MD    Allergies as of 07/28/2016 - Review Complete 07/28/2016  Allergen Reaction Noted  . Naproxen Other (See Comments) 05/05/2016    Family History  Problem Relation Age of Onset  . GER disease Mother   . Other Mother     diverticulitis; hernia  . Diabetes Mother     borderline  .  Diabetes Maternal Grandmother   . COPD Maternal Grandmother   . Diabetes Maternal Grandfather   . Congestive Heart Failure Maternal Grandfather   . Headache Son   . Diabetes Other   . Colon cancer Neg Hx   . Colon polyps Neg Hx     Social History   Social History  . Marital status: Single    Spouse name: N/A  . Number of children: 1  . 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 Smoking status: Former Smoker    Packs/day: 0.25    Years: 0.50    Types: Cigarettes    Quit date: 07/28/2011  . Smokeless tobacco:  Never Used     Comment: stopped in 2009  . Alcohol use No  . Drug use: No  . Sexual activity: Not Currently    Birth control/ protection: Injection   Other Topics Concern  . Not on file   Social History Narrative  . No narrative on file    Review of Systems: See HPI, otherwise negative ROS   Physical Exam: BP 118/73   Pulse 87   Temp 98 F (36.7 C) (Oral)   Resp (!) 23   Ht 5\' 6"  (1.676 m)   Wt (!) 325 lb (147.4 kg)   LMP 07/26/2016 (Exact Date)   SpO2 97%   BMI 52.46 kg/m  General:   Alert,  pleasant and cooperative in NAD Head:  Normocephalic and atraumatic. Neck:  Supple; Lungs:  Clear throughout to auscultation.    Heart:  Regular rate and rhythm. Abdomen:  Soft, nontender and nondistended. Normal bowel sounds, without guarding, and without rebound.   Neurologic:  Alert and  oriented x4;  grossly normal neurologically.  Impression/Plan:    gastroesophageal reflux disease, UNCONTROLLED  PLAN: 1. EGD TODAY

## 2016-07-31 NOTE — Discharge Instructions (Signed)
YOUR REFLUX IN UNCONTROLLED DUE TO YOUR LIFESTYLE CHOICES. YOU have gastritis BECAUSE YOU'RE USING VOLTAREN. YOU HAVE A SMALL HIATAL HERNIA. I biopsied your ESOPHAGUS AND stomach.   CONTINUE YOUR WEIGHT LOSS EFFORTS. I AM REFERRING YOU TO [facility] TO DISCUSS WEIGHT REDUCTION SURGERY.  DRINK WATER TO KEEP YOUR URINE LIGHT YELLOW.  FOLLOW A HIGH FIBER/LOW FAT DIET. I WILL MAKE A NUTRITION REFERRAL.  CONTINUE PROTONIX. TAKE 30 MINUTES PRIOR TO MEALS TWICE DAILY.  ELEVATE THE HEAD OF YOUR BED ON 6 INCH BLOCKS.  AVOID LYING DOWN AFTER MEALS.  DO NO EAT BEDTIME SNACKS.  AVOID FOOD AND DRINKS THAT TRIGGER REFLUX. SEE INFO BELOW.  YOUR BIOPSY RESULTS WILL BE AVAILABLE IN MY CHART AFTER SEP 19 AND MY OFFICE WILL CONTACT YOU IN 10-14 DAYS WITH YOUR RESULTS.   FOLLOW UP IN JAN 2018.   UPPER ENDOSCOPY AFTER CARE Read the instructions outlined below and refer to this sheet in the next week. These discharge instructions provide you with general information on caring for yourself after you leave the hospital. While your treatment has been planned according to the most current medical practices available, unavoidable complications occasionally occur. If you have any problems or questions after discharge, call DR. Jonmichael Beadnell, [phone].  ACTIVITY  You may resume your regular activity, but move at a slower pace for the next 24 hours.   Take frequent rest periods for the next 24 hours.   Walking will help get rid of the air and reduce the bloated feeling in your belly (abdomen).   No driving for 24 hours (because of the medicine (anesthesia) used during the test).   You may shower.   Do not sign any important legal documents or operate any machinery for 24 hours (because of the anesthesia used during the test).    NUTRITION  Drink plenty of fluids.   You may resume your normal diet as instructed by your doctor.   Begin with a light meal and progress to your normal diet. Heavy or fried  foods are harder to digest and may make you feel sick to your stomach (nauseated).   Avoid alcoholic beverages for 24 hours or as instructed.    MEDICATIONS  You may resume your normal medications.   WHAT YOU CAN EXPECT TODAY  Some feelings of bloating in the abdomen.   Passage of more gas than usual.    IF YOU HAD A BIOPSY TAKEN DURING THE UPPER ENDOSCOPY:  Eat a soft diet IF YOU HAVE NAUSEA, BLOATING, ABDOMINAL PAIN, OR VOMITING.    FINDING OUT THE RESULTS OF YOUR TEST Not all test results are available during your visit. DR. Darrick Penna WILL CALL YOU WITHIN 7 DAYS OF YOUR PROCEDUE WITH YOUR RESULTS. Do not assume everything is normal if you have not heard from DR. Remee Charley IN ONE WEEK, CALL HER OFFICE AT [phone].  SEEK IMMEDIATE MEDICAL ATTENTION AND CALL THE OFFICE: [phone] IF:  You have more than a spotting of blood in your stool.   Your belly is swollen (abdominal distention).   You are nauseated or vomiting.   You have a temperature over 101F.   You have abdominal pain or discomfort that is severe or gets worse throughout the day.   FOLLOW LIFESTYLE RECOMMENDATION TO HELP MANAGE YOUR CHEST PAIN. SEE INFO BELOW.  CONTINUE NEXIUM. TAKE 30 MINUTES BEFORE MEALS TWICE DAILY.  USE ALAMAG AS NEEDED FOR CHEST HEAVINESS/CHEST PAIN/HEARTBURN.  FOLLOW A LOW FAT DIET. SEE INFO BELOW.  START AN EXERCISE PROGRAM. OU  SHOULD WALK FOR 30 MINS STRAIGHT 3 TIMES A WEEK.  YOU SHOULD LOSE 10 LBS WITHIN THE NEXT 3 MOS.  FOLLOW UP IN 2 MOS.   TAKE YOUR ASPIRIN DAILY.  CONTINUE OMEPRAZOLE TWICE DAILY. TAKE 30 MINUTES TO 1 HOUR PRIOR TO MEALS.  CONTINUE TENUATE. TAKE  HOUR BEFORE MEALS UP TO 3 TIMES A DAY.  DRINK WATER. AVOID KOOL-AID, SWEET TEA, JUICE, & SODA.  FOLLOW UP IN 2 MOS.     Lifestyle and home remedies TO MANAGE REFLUX/CHEST PAIN  You may eliminate or reduce the frequency of heartburn by making the following lifestyle changes:   Control your weight.  Being overweight is a major risk factor for heartburn and GERD. Excess pounds put pressure on your abdomen, pushing up your stomach and causing acid to back up into your esophagus.    Eat smaller meals. 4 TO 6 MEALS A DAY. This reduces pressure on the lower esophageal sphincter, helping to prevent the valve from opening and acid from washing back into your esophagus.    Loosen your belt. Clothes that fit tightly around your waist put pressure on your abdomen and the lower esophageal sphincter.    Eliminate heartburn triggers. Everyone has specific triggers. Common triggers such as fatty or fried foods, spicy food, tomato sauce, carbonated beverages, alcohol, chocolate, mint, garlic, onion, caffeine and nicotine may make heartburn worse.    Avoid stooping or bending. Tying your shoes is OK. Bending over for longer periods to weed your garden isn't, especially soon after eating.    Don't lie down after a meal. Wait at least three to four hours after eating before going to bed, and don't lie down right after eating.    PUT THE HEAD OF YOUR BED ON 6 INCH BLOCKS.   Alternative medicine  Several home remedies exist for treating GERD, but they provide only temporary relief. They include drinking baking soda (sodium bicarbonate) added to water or drinking other fluids such as baking soda mixed with cream of tartar and water.   Although these liquids create temporary relief by neutralizing, washing away or buffering acids, eventually they aggravate the situation by adding gas and fluid to your stomach, increasing pressure and causing more acid reflux. Further, adding more sodium to your diet may increase your blood pressure and add stress to your heart, and excessive bicarbonate ingestion can alter the acid-base balance in your body.     Gastritis  Gastritis is an inflammation (the body's way of reacting to injury and/or infection) of the stomach. It is often caused by bacterial (germ)  infections. It can also be caused BY ASPIRIN, BC/GOODY POWDER'S, (IBUPROFEN) MOTRIN, OR ALEVE (NAPROXEN), chemicals (including alcohol), SPICY FOODS, and medications. This illness may be associated with generalized malaise (feeling tired, not well), UPPER ABDOMINAL STOMACH cramps, and fever. One common bacterial cause of gastritis is an organism known as H. Pylori. This can be treated with antibiotics.   REFLUX   TREATMENT There are a number of  medicines used to treat reflux including: Antacids.  ZANTAC Proton-pump inhibitors: PROTONIX  HOME CARE INSTRUCTIONS Eat 2-3 hours before going to bed.  Try to reach and maintain a healthy weight. LOSE 10-20 LBS Do not eat just a few very large meals. Instead, eat 4 TO 6 smaller meals throughout the day.  Try to identify foods and beverages that make your symptoms worse, and avoid these.  Avoid tight clothing.  Do not exercise right after eating.   Low-Fat Diet BREADS, CEREALS, PASTA,  RICE, DRIED PEAS, AND BEANS These products are high in carbohydrates and most are low in fat. Therefore, they can be increased in the diet as substitutes for fatty foods. They too, however, contain calories and should not be eaten in excess. Cereals can be eaten for snacks as well as for breakfast.  Include foods that contain fiber (fruits, vegetables, whole grains, and legumes). Research shows that fiber may lower blood cholesterol levels, especially the water-soluble fiber found in fruits, vegetables, oat products, and legumes. FRUITS AND VEGETABLES It is good to eat fruits and vegetables. Besides being sources of fiber, both are rich in vitamins and some minerals. They help you get the daily allowances of these nutrients. Fruits and vegetables can be used for snacks and desserts. MEATS Limit lean meat, chicken, [place], and fish to no more than 6 ounces per day. Beef, Pork, and Lamb Use lean cuts of beef, pork, and lamb. Lean cuts include:  Extra-lean ground  beef.  Arm roast.  Sirloin tip.  Center-cut ham.  Round steak.  Loin chops.  Rump roast.  Tenderloin.  Trim all fat off the outside of meats before cooking. It is not necessary to severely decrease the intake of red meat, but lean choices should be made. Lean meat is rich in protein and contains a highly absorbable form of iron. Premenopausal women, in particular, should avoid reducing lean red meat because this could increase the risk for low red blood cells (iron-deficiency anemia).  Chicken and [place] These are good sources of protein. The fat of poultry can be reduced by removing the skin and underlying fat layers before cooking. Chicken and [place] can be substituted for lean red meat in the diet. Poultry should not be fried or covered with high-fat sauces. Fish and Shellfish Fish is a good source of protein. Shellfish contain cholesterol, but they usually are low in saturated fatty acids. The preparation of fish is important. Like chicken and [place], they should not be fried or covered with high-fat sauces. EGGS Egg whites contain no fat or cholesterol. They can be eaten often. Try 1 to 2 egg whites instead of whole eggs in recipes or use egg substitutes that do not contain yolk.  MILK AND DAIRY PRODUCTS Use skim or 1% milk instead of 2% or whole milk. Decrease whole milk, natural, and processed cheeses. Use nonfat or low-fat (2%) cottage cheese or low-fat cheeses made from vegetable oils. Choose nonfat or low-fat (1 to 2%) yogurt. Experiment with evaporated skim milk in recipes that call for heavy cream. Substitute low-fat yogurt or low-fat cottage cheese for sour cream in dips and salad dressings. Have at least 2 servings of low-fat dairy products, such as 2 glasses of skim (or 1%) milk each day to help get your daily calcium intake.  FATS AND OILS Butterfat, lard, and beef fats are high in saturated fat and cholesterol. These should be avoided.Vegetable fats do not contain cholesterol.  AVOID coconut oil, palm oil, and palm kernel oil, WHICH are very high in saturated fats. These should be limited. These fats are often used in bakery goods, processed foods, popcorn, oils, and nondairy creamers. Vegetable shortenings and some peanut butters contain hydrogenated oils, which are also saturated fats. Read the labels on these foods and check for saturated vegetable oils.  Desirable liquid vegetable oils are corn oil, cottonseed oil, olive oil, canola oil, safflower oil, soybean oil, and sunflower oil. Peanut oil is not as good, but small amounts are acceptable. Buy a heart-healthy tub  margarine that has no partially hydrogenated oils in the ingredients. AVOID Mayonnaise and salad dressings often are made from unsaturated fats.  OTHER EATING TIPS Snacks  Most sweets should be limited as snacks. They tend to be rich in calories and fats, and their caloric content outweighs their nutritional value. Some good choices in snacks are graham crackers, melba toast, soda crackers, bagels (no egg), English muffins, fruits, and vegetables. These snacks are preferable to snack crackers, [place] fries, and chips. Popcorn should be air-popped or cooked in small amounts of liquid vegetable oil.  Desserts Eat fruit, low-fat yogurt, and fruit ices instead of pastries, cake, and cookies. Sherbet, angel food cake, gelatin dessert, frozen low-fat yogurt, or other frozen products that do not contain saturated fat (pure fruit juice bars, frozen ice pops) are also acceptable.   COOKING METHODS Choose those methods that use little or no fat. They include: Poaching.  Braising.  Steaming.  Grilling.  Baking.  Stir-frying.  Broiling.  Microwaving.  Foods can be cooked in a nonstick pan without added fat, or use a nonfat cooking spray in regular cookware. Limit fried foods and avoid frying in saturated fat. Add moisture to lean meats by using water, broth, cooking wines, and other nonfat or low-fat sauces along  with the cooking methods mentioned above. Soups and stews should be chilled after cooking. The fat that forms on top after a few hours in the refrigerator should be skimmed off. When preparing meals, avoid using excess salt. Salt can contribute to raising blood pressure in some people.  EATING AWAY FROM HOME Order entres, potatoes, and vegetables without sauces or butter. When meat exceeds the size of a deck of cards (3 to 4 ounces), the rest can be taken home for another meal. Choose vegetable or fruit salads and ask for low-calorie salad dressings to be served on the side. Use dressings sparingly. Limit high-fat toppings, such as bacon, crumbled eggs, cheese, sunflower seeds, and olives. Ask for heart-healthy tub margarine instead of butter.

## 2016-07-31 NOTE — Op Note (Addendum)
[facility] Patient Name: [name] Procedure Date: 07/31/2016 10:16 AM MRN: [id number] Date of Birth: 1986/09/04 Attending MD: Jonette Eva , MD CSN: [id number] Age: 30 Admit Type: Outpatient Procedure:                Upper GI endoscopy WITH COLD FORCEPS BIOPSY OF                            ESOPHAGUS AND STOMACH Indications:              Esophageal reflux symptoms that persist despite                            appropriate therapy Providers:                Jonette Eva, MD, Jannett Celestine, RN, Birder Robson,                            Technician Referring MD:             Lytle Michaels. Kim Medicines:                Meperidine 50 mg IV, Midazolam 4 mg IV,                            Promethazine 25 mg IV Complications:            No immediate complications. Estimated Blood Loss:     Estimated blood loss was minimal. Procedure:                Pre-Anesthesia Assessment:                           - Prior to the procedure, a History and Physical                            was performed, and patient medications and                            allergies were reviewed. The patient's tolerance of                            previous anesthesia was also reviewed. The risks                            and benefits of the procedure and the sedation                            options and risks were discussed with the patient.                            All questions were answered, and informed consent                            was obtained. Prior Anticoagulants: The patient has  taken previous NSAID medication, last dose was day                            of procedure. ASA Grade Assessment: III - A patient                            with severe systemic disease. After reviewing the                            risks and benefits, the patient was deemed in                            satisfactory condition to undergo the procedure.                            After obtaining  informed consent, the endoscope was                            passed under direct vision. Throughout the                            procedure, the patient's blood pressure, pulse, and                            oxygen saturations were monitored continuously. The                            EG-[id number] ([id number]) scope was introduced through the                            mouth, and advanced to the second part of duodenum.                            The upper GI endoscopy was accomplished without                            difficulty. The patient tolerated the procedure                            well. Scope In: 10:44:18 AM Scope Out: 10:53:43 AM Total Procedure Duration: 0 hours 9 minutes 25 seconds  Findings:      PT HAS FURROWS IN THE ESOPHAGUS. COLD FORCEPS BIOPSIES OBTAINED IN THE       PROXIMAL(18 CM FROM INCISORS) AND DISTAL ESOPHAGUS(37 CM FROM THE       INCISORS). 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      Diffuse moderate inflammation characterized by congestion (edema) and       erythema was found in the gastric antrum. This was biopsied with a cold       forceps for EOSINOPHILIC GASTRITIS.      The examined duodenum was normal. Impression:               - MODERATE Gastritis. Moderate Sedation:  Moderate (conscious) sedation was administered by the endoscopy nurse       and supervised by the endoscopist. The following parameters were       monitored: oxygen saturation, heart rate, blood pressure, and response       to care. Total physician intraservice time was 21 minutes. Recommendation:           - High fiber diet and low fat diet.                           - Continue present medications. PROTONIX 30 MINS                            PRIOR TO MEALS TWICE DAILY.                           - Await pathology results.                           - Return to my office in 4 months.                           -  ELEVATE HEAD OF BED. AVOID LYING DOWN AFTER MEALS                            AND BEDTIME SNACKS.                           - AVOID FOOD AND DRINKS THAT TRIGGER REFLUX.                           - Refer to a surgeon FOR GASTRIC BYPASS/WEIGHT LOSS.                           - Patient has a contact number available for                            emergencies. The signs and symptoms of potential                            delayed complications were discussed with the                            patient. Return to normal activities tomorrow.                            Written discharge instructions were provided to the                            patient. Procedure Code(s):        --- Professional ---                           [id number], Esophagogastroduodenoscopy, flexible,  transoral; with biopsy, single or multiple                           99152, Moderate sedation services provided by the                            same physician or other qualified health care                            professional performing the diagnostic or                            therapeutic service that the sedation supports,                            requiring the presence of an independent trained                            observer to assist in the monitoring of the                            patient's level of consciousness and physiological                            status; initial 15 minutes of intraservice time,                            patient age 51 years or older Diagnosis Code(s):        --- Professional ---                           K29.70, Gastritis, unspecified, without bleeding                           K21.9, Gastro-esophageal reflux disease without                            esophagitis CPT copyright 2016 American Medical Association. All rights reserved. The codes documented in this report are preliminary and upon coder review may  be revised to meet current compliance  requirements. Jonette Eva, MD Jonette Eva, MD 07/31/2016 11:05:50 AM This report has been signed electronically. Number of Addenda: 0

## 2016-08-04 ENCOUNTER — Telehealth: Payer: Self-pay

## 2016-08-04 ENCOUNTER — Other Ambulatory Visit: Payer: Self-pay

## 2016-08-04 NOTE — Telephone Encounter (Signed)
[facility].

## 2016-08-04 NOTE — Progress Notes (Signed)
Sent referral to [facility] and to Nutrition.

## 2016-08-04 NOTE — Telephone Encounter (Signed)
Sent referral to [facility] for weight reduction surgery. They returned fax stating they don't accept Medicaid for bariatric surgery.

## 2016-08-05 ENCOUNTER — Telehealth: Payer: Self-pay

## 2016-08-05 NOTE — Telephone Encounter (Signed)
Called [facility] at [facility] re: referral. Receptionist advised for the pt to call and be set up for free info seminar. LMOM and informed pt, phone number of weight management center given.

## 2016-08-05 NOTE — Telephone Encounter (Signed)
Pt called and asked about weight management referral being [facility]. Informed pt that referral was sent to [facility] but they do not accept Medicaid. Pt to call [facility] to inquire if they accept Medicaid. Has phone number.

## 2016-08-05 NOTE — Telephone Encounter (Signed)
REVIEWED-NO ADDITIONAL RECOMMENDATIONS. 

## 2016-08-10 ENCOUNTER — Encounter (HOSPITAL_COMMUNITY): Payer: Self-pay | Admitting: Gastroenterology

## 2016-08-25 ENCOUNTER — Ambulatory Visit: Payer: Medicaid Other | Admitting: Nutrition

## 2016-09-07 ENCOUNTER — Encounter: Payer: Medicaid Other | Attending: Gastroenterology | Admitting: Nutrition

## 2016-09-07 DIAGNOSIS — E785 Hyperlipidemia, unspecified: Secondary | ICD-10-CM

## 2016-09-07 NOTE — Progress Notes (Signed)
  Medical Nutrition Therapy:  Appt start time: 1400 end time:  1500.  Assessment:  Primary concerns today Obesity, BMI  53.Marland Kitchen. LIves with her mom and son. Her son is 8. She does the shopping and her mom does the cooking. Is on Depo shot for contraception. Has gained about 40 + lbs since going on these shots. Desires alternative contraception. Eats three meals per day. Currenlty not working. 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  Labs from Dr. Selena BattenKim office report  LDL elevated 122, HDL low 30 and no prediabetes with A1C of 5.2%.  Physical activity: ADL.  See Dr. Selena BattenKIm for PCP.   Diet is high in processed sugar, salt and low in fresh fruits and low carb vegetables. Doesn't drink any water.  She is willing to make changes and her mom will be supportive and make changes too.  Preferred Learning Style:   No preference indicated   Learning Readiness:  Ready  Change in progress  MEDICATIONS: See list   DIETARY INTAKE:    24-hr recall:  B ( AM): Eggs, bacon or bologna and toast, Soda 12 oz  Snk ( AM): cookies, 1-oatmeal rasin,  L ( PM): Sandwich-ham and cheese on white bread, 12 oz,  Snk ( PM): chips bbq( 15) D ( PM): Chicken and dumplings 1 cup, Soda, or Lemondade Snk ( PM): [employer]udge cookies-3,  Beverages: soda, lemonade  Usual physical activity: ADL.  Estimated energy needs: 1500 calories 170 g carbohydrates 112 g protein 42 g fat  Progress Towards Goal(s):  In progress.   Nutritional Diagnosis:  [place]-3.3 Overweight/obesity As related to excessive calorie intake .  As evidenced by BMI >40.    Intervention:  MY plate, portion sizes, meal planning, avoiding snacks, nutrient density of foods, cutting out processed foods, increasing fresh fruits and vegetables, drinking water, healthy weight loss tips, risk for DM and cardiovascular issues with elevated TG and LDL and low HDL. Low sodium high fiber diet  Goals 1. Follow My Plate 2. Cut out sodas, sweets, sugar, candy   And junk food/processed foods 3. Increase fresh fruits and vegetables 4. Eat three balanced meals per day 5. Drink ONLY Water dailly-- 64 0z or more 6. Exercise 30 minutes every day. Lose 1-2 lbs per week Teaching Method Utilized:  Visual Auditory Hands on  Handouts given during visit include:  The Plate Method  Meal Plan  Weight loss tips and strategies.  Barriers to learning/adherence to lifestyle change:  None  Demonstrated degree of understanding via:  Teach Back   Monitoring/Evaluation:  Dietary intake, exercise, meal planning, and body weight in 1 month(s).

## 2016-09-08 NOTE — Patient Instructions (Addendum)
Goals 1. Follow My Plate 2. Cut out sodas, sweets, sugar, candy  And junk food/processed foods 3. Increase fresh fruits and vegetables 4. Eat three balanced meals per day 5. Drink ONLY Water dailly-- 64 0z or more 6. Exercise 30 minutes every day. Lose 1-2 lbs per week

## 2016-09-18 ENCOUNTER — Ambulatory Visit: Payer: Medicaid Other

## 2016-09-24 ENCOUNTER — Ambulatory Visit (INDEPENDENT_AMBULATORY_CARE_PROVIDER_SITE_OTHER): Payer: Medicaid Other | Admitting: Adult Health

## 2016-09-24 ENCOUNTER — Encounter: Payer: Self-pay | Admitting: Adult Health

## 2016-09-24 VITALS — BP 100/60 | HR 104 | Ht 66.0 in | Wt 326.4 lb

## 2016-09-24 DIAGNOSIS — R635 Abnormal weight gain: Secondary | ICD-10-CM

## 2016-09-24 DIAGNOSIS — Z3202 Encounter for pregnancy test, result negative: Secondary | ICD-10-CM

## 2016-09-24 DIAGNOSIS — Z30011 Encounter for initial prescription of contraceptive pills: Secondary | ICD-10-CM | POA: Diagnosis not present

## 2016-09-24 DIAGNOSIS — R599 Enlarged lymph nodes, unspecified: Secondary | ICD-10-CM

## 2016-09-24 LAB — POCT URINE PREGNANCY: PREG TEST UR: NEGATIVE

## 2016-09-24 MED ORDER — NORETHIN-ETH ESTRAD-FE BIPHAS 1 MG-10 MCG / 10 MCG PO TABS: 1.0000 | ORAL_TABLET | Freq: Every day | ORAL | 11 refills | Status: DC

## 2016-09-24 NOTE — Progress Notes (Addendum)
Subjective:     Patient ID: [name], female   DOB: [birth year], 30 y.o.   MRN: [id number]  HPI [name] is a 30 year old white female in to discuss stopping depo(last shot in August) and getting on OCs, has gained weight.She also noticed sore lump under left arm this morning in the shower. PCP is Dr Selena BattenKim.  Review of Systems + weight gain +sore lump left under arm Reviewed past medical,surgical, social and family history. Reviewed medications and allergies.     Objective:   Physical Exam BP 100/60 (BP Location: Left Arm, Patient Position: Sitting, Cuff Size: Large)   Pulse (!) 104   Ht 5\' 6"  (1.676 m)   Wt (!) 326 lb 6.4 oz (148.1 kg)   BMI 52.68 kg/m UPT negative. Skin warm and dry.  Lungs: clear to ausculation bilaterally. Cardiovascular: regular rate and rhythm.   +swollen lymph node left under arm  PHQ 2 score 0. She had labs with Dr Selena BattenKim and she wants [facility]  Assessment:     1. Encounter for initial prescription of contraceptive pills   2. Pregnancy examination or test, negative result   3. Swollen lymph nodes   4. Weight gain       Plan:    Rx lo loestrin disp 1 pack take 1 daily with 11 refills, start Sunday, and use condoms Work on weight now and review handout  Follow up in 3 months on OCs and recheck lymph node

## 2016-09-24 NOTE — Patient Instructions (Addendum)
Start lo loestrin Sunday Use condoms  Calorie Counting for Weight Loss Calories are energy you get from the things you eat and drink. Your body uses this energy to keep you going throughout the day. The number of calories you eat affects your weight. When you eat more calories than your body needs, your body stores the extra calories as fat. When you eat fewer calories than your body needs, your body burns fat to get the energy it needs. Calorie counting means keeping track of how many calories you eat and drink each day. If you make sure to eat fewer calories than your body needs, you should lose weight. In order for calorie counting to work, you will need to eat the number of calories that are right for you in a day to lose a healthy amount of weight per week. A healthy amount of weight to lose per week is usually 1-2 lb (0.5-0.9 kg). A dietitian can determine how many calories you need in a day and give you suggestions on how to reach your calorie goal.  WHAT IS MY MY PLAN? My goal is to have ___1500_______ calories per day.  If I have this many calories per day, I should lose around ___1-2_______ pounds per week. WHAT DO I NEED TO KNOW ABOUT CALORIE COUNTING? In order to meet your daily calorie goal, you will need to:  Find out how many calories are in each food you would like to eat. Try to do this before you eat.  Decide how much of the food you can eat.  Write down what you ate and how many calories it had. Doing this is called keeping a food log. WHERE DO I FIND CALORIE INFORMATION? The number of calories in a food can be found on a Nutrition Facts label. Note that all the information on a label is based on a specific serving of the food. If a food does not have a Nutrition Facts label, try to look up the calories online or ask your dietitian for help. HOW DO I DECIDE HOW MUCH TO EAT? To decide how much of the food you can eat, you will need to consider both the number of calories in one  serving and the size of one serving. This information can be found on the Nutrition Facts label. If a food does not have a Nutrition Facts label, look up the information online or ask your dietitian for help. Remember that calories are listed per serving. If you choose to have more than one serving of a food, you will have to multiply the calories per serving by the amount of servings you plan to eat. For example, the label on a package of bread might say that a serving size is 1 slice and that there are 90 calories in a serving. If you eat 1 slice, you will have eaten 90 calories. If you eat 2 slices, you will have eaten 180 calories. HOW DO I KEEP A FOOD LOG? After each meal, record the following information in your food log:  What you ate.  How much of it you ate.  How many calories it had.  Then, add up your calories. Keep your food log near you, such as in a small notebook in your pocket. Another option is to use a mobile app or website. Some programs will calculate calories for you and show you how many calories you have left each time you add an item to the log. WHAT ARE SOME CALORIE COUNTING  TIPS?  Use your calories on foods and drinks that will fill you up and not leave you hungry. Some examples of this include foods like nuts and nut butters, vegetables, lean proteins, and high-fiber foods (more than 5 g fiber per serving).  Eat nutritious foods and avoid empty calories. Empty calories are calories you get from foods or beverages that do not have many nutrients, such as candy and soda. It is better to have a nutritious high-calorie food (such as an avocado) than a food with few nutrients (such as a bag of chips).  Know how many calories are in the foods you eat most often. This way, you do not have to look up how many calories they have each time you eat them.  Look out for foods that may seem like low-calorie foods but are really high-calorie foods, such as baked goods, soda, and  fat-free candy.  Pay attention to calories in drinks. Drinks such as sodas, specialty coffee drinks, alcohol, and juices have a lot of calories yet do not fill you up. Choose low-calorie drinks like water and diet drinks.  Focus your calorie counting efforts on higher calorie items. Logging the calories in a garden salad that contains only vegetables is less important than calculating the calories in a milk shake.  Find a way of tracking calories that works for you. Get creative. Most people who are successful find ways to keep track of how much they eat in a day, even if they do not count every calorie. WHAT ARE SOME PORTION CONTROL TIPS?  Know how many calories are in a serving. This will help you know how many servings of a certain food you can have.  Use a measuring cup to measure serving sizes. This is helpful when you start out. With time, you will be able to estimate serving sizes for some foods.  Take some time to put servings of different foods on your favorite plates, bowls, and cups so you know what a serving looks like.  Try not to eat straight from a bag or box. Doing this can lead to overeating. Put the amount you would like to eat in a cup or on a plate to make sure you are eating the right portion.  Use smaller plates, glasses, and bowls to prevent overeating. This is a quick and easy way to practice portion control. If your plate is smaller, less food can fit on it.  Try not to multitask while eating, such as watching TV or using your computer. If it is time to eat, sit down at a table and enjoy your food. Doing this will help you to start recognizing when you are full. It will also make you more aware of what and how much you are eating. HOW CAN I CALORIE COUNT WHEN EATING OUT?  Ask for smaller portion sizes or child-sized portions.  Consider sharing an entree and sides instead of getting your own entree.  If you get your own entree, eat only half. Ask for a box at the  beginning of your meal and put the rest of your entree in it so you are not tempted to eat it.  Look for the calories on the menu. If calories are listed, choose the lower calorie options.  Choose dishes that include vegetables, fruits, whole grains, low-fat dairy products, and lean protein. Focusing on smart food choices from each of the 5 food groups can help you stay on track at restaurants.  Choose items that are  boiled, broiled, grilled, or steamed.  Choose water, milk, unsweetened iced tea, or other drinks without added sugars. If you want an alcoholic beverage, choose a lower calorie option. For example, a regular margarita can have up to 700 calories and a glass of wine has around 150.  Stay away from items that are buttered, battered, fried, or served with cream sauce. Items labeled "crispy" are usually fried, unless stated otherwise.  Ask for dressings, sauces, and syrups on the side. These are usually very high in calories, so do not eat much of them.  Watch out for salads. Many people think salads are a healthy option, but this is often not the case. Many salads come with bacon, fried chicken, lots of cheese, fried chips, and dressing. All of these items have a lot of calories. If you want a salad, choose a garden salad and ask for grilled meats or steak. Ask for the dressing on the side, or ask for olive oil and vinegar or lemon to use as dressing.  Estimate how many servings of a food you are given. For example, a serving of cooked rice is  cup or about the size of half a tennis ball or one cupcake wrapper. Knowing serving sizes will help you be aware of how much food you are eating at restaurants. The list below tells you how big or small some common portion sizes are based on everyday objects.  1 oz--4 stacked dice.  3 oz--1 deck of cards.  1 tsp--1 dice.  1 Tbsp-- a Ping-Pong ball.  2 Tbsp--1 Ping-Pong ball.   cup--1 tennis ball or 1 cupcake wrapper.  1 cup--1  baseball.   This information is not intended to replace advice given to you by your health care provider. Make sure you discuss any questions you have with your health care provider.   Document Released: 11/02/2005 Document Revised: 11/23/2014 Document Reviewed: 09/07/2013 Elsevier Interactive Patient Education [employer]  Follow up in 3 months

## 2016-09-29 ENCOUNTER — Emergency Department (HOSPITAL_COMMUNITY): Payer: Medicaid Other

## 2016-09-29 ENCOUNTER — Emergency Department (HOSPITAL_COMMUNITY)
Admission: EM | Admit: 2016-09-29 | Discharge: 2016-09-30 | Disposition: A | Discharge status: Home / Self Care | Payer: Medicaid Other | Attending: Emergency Medicine | Admitting: Emergency Medicine

## 2016-09-29 ENCOUNTER — Encounter (HOSPITAL_COMMUNITY): Payer: Self-pay | Admitting: *Deleted

## 2016-09-29 DIAGNOSIS — S20219A Contusion of unspecified front wall of thorax, initial encounter: Secondary | ICD-10-CM

## 2016-09-29 DIAGNOSIS — Z79899 Other long term (current) drug therapy: Secondary | ICD-10-CM | POA: Diagnosis not present

## 2016-09-29 DIAGNOSIS — Y999 Unspecified external cause status: Secondary | ICD-10-CM | POA: Insufficient documentation

## 2016-09-29 DIAGNOSIS — H5712 Ocular pain, left eye: Secondary | ICD-10-CM

## 2016-09-29 DIAGNOSIS — F419 Anxiety disorder, unspecified: Secondary | ICD-10-CM | POA: Insufficient documentation

## 2016-09-29 DIAGNOSIS — Y92410 Unspecified street and highway as the place of occurrence of the external cause: Secondary | ICD-10-CM | POA: Diagnosis not present

## 2016-09-29 DIAGNOSIS — Z87891 Personal history of nicotine dependence: Secondary | ICD-10-CM | POA: Diagnosis not present

## 2016-09-29 DIAGNOSIS — S161XXA Strain of muscle, fascia and tendon at neck level, initial encounter: Secondary | ICD-10-CM | POA: Insufficient documentation

## 2016-09-29 DIAGNOSIS — Y9389 Activity, other specified: Secondary | ICD-10-CM | POA: Diagnosis not present

## 2016-09-29 DIAGNOSIS — S199XXA Unspecified injury of neck, initial encounter: Secondary | ICD-10-CM | POA: Diagnosis present

## 2016-09-29 DIAGNOSIS — R51 Headache: Secondary | ICD-10-CM | POA: Insufficient documentation

## 2016-09-29 MED ORDER — PROMETHAZINE HCL 12.5 MG PO TABS
12.5000 mg | ORAL_TABLET | Freq: Once | ORAL | Status: AC
  Administered 2016-09-30: 12.5 mg via ORAL
  Filled 2016-09-29: qty 1

## 2016-09-29 MED ORDER — HYDROCODONE-ACETAMINOPHEN 5-325 MG PO TABS
2.0000 | ORAL_TABLET | Freq: Once | ORAL | Status: AC
Start: 2016-09-30 — End: 2016-09-30
  Administered 2016-09-30: 2 via ORAL
  Filled 2016-09-29: qty 2

## 2016-09-29 NOTE — ED Notes (Signed)
ED Provider at bedside. 
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
## 2016-09-30 ENCOUNTER — Emergency Department (HOSPITAL_COMMUNITY): Payer: Medicaid Other

## 2016-09-30 MED ORDER — HYDROCODONE-ACETAMINOPHEN 5-325 MG PO TABS: 1.0000 | ORAL_TABLET | ORAL | 0 refills | Status: DC | PRN

## 2016-09-30 MED ORDER — TETRACAINE HCL 0.5 % OP SOLN
2.0000 [drp] | Freq: Once | OPHTHALMIC | Status: DC
  Filled 2016-09-30: qty 4

## 2016-09-30 MED ORDER — CYCLOBENZAPRINE HCL 10 MG PO TABS
10.0000 mg | ORAL_TABLET | Freq: Three times a day (TID) | ORAL | 0 refills | Status: DC
Start: 2016-09-30 — End: 2017-01-12

## 2016-09-30 MED ORDER — FLUORESCEIN SODIUM 1 MG OP STRP
1.0000 | ORAL_STRIP | Freq: Once | OPHTHALMIC | Status: DC
  Filled 2016-09-30: qty 1

## 2016-09-30 NOTE — ED Notes (Signed)
ED Provider at bedside. 

## 2016-09-30 NOTE — Discharge Instructions (Addendum)
Your CT head and neck scan are negative. Your chest xray is negative for acute problem. Please use flexeril and tylenol daily. Use norco for more severe pain. See Dr Lita MainsHaines for eye evaluation if not improving.
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
## 2016-10-05 ENCOUNTER — Emergency Department (HOSPITAL_COMMUNITY)
Admission: EM | Admit: 2016-10-05 | Discharge: 2016-10-05 | Disposition: A | Discharge status: Home / Self Care | Payer: Medicaid Other | Attending: Emergency Medicine | Admitting: Emergency Medicine

## 2016-10-05 ENCOUNTER — Ambulatory Visit: Payer: Medicaid Other | Admitting: Nutrition

## 2016-10-05 ENCOUNTER — Encounter (HOSPITAL_COMMUNITY): Payer: Self-pay | Admitting: Emergency Medicine

## 2016-10-05 ENCOUNTER — Telehealth: Payer: Self-pay | Admitting: Nutrition

## 2016-10-05 ENCOUNTER — Emergency Department (HOSPITAL_COMMUNITY): Payer: Medicaid Other

## 2016-10-05 DIAGNOSIS — Z87891 Personal history of nicotine dependence: Secondary | ICD-10-CM | POA: Insufficient documentation

## 2016-10-05 DIAGNOSIS — J208 Acute bronchitis due to other specified organisms: Secondary | ICD-10-CM

## 2016-10-05 DIAGNOSIS — Z79899 Other long term (current) drug therapy: Secondary | ICD-10-CM | POA: Insufficient documentation

## 2016-10-05 DIAGNOSIS — R05 Cough: Secondary | ICD-10-CM | POA: Diagnosis present

## 2016-10-05 DIAGNOSIS — J040 Acute laryngitis: Secondary | ICD-10-CM

## 2016-10-05 MED ORDER — HYDROCOD POLST-CPM POLST ER 10-8 MG/5ML PO SUER
5.0000 mL | Freq: Once | ORAL | Status: AC
  Administered 2016-10-05: 5 mL via ORAL
  Filled 2016-10-05: qty 5

## 2016-10-05 MED ORDER — PREDNISONE 20 MG PO TABS
40.0000 mg | ORAL_TABLET | Freq: Once | ORAL | Status: AC
  Administered 2016-10-05: 40 mg via ORAL
  Filled 2016-10-05: qty 2

## 2016-10-05 MED ORDER — DEXAMETHASONE 4 MG PO TABS: 4.0000 mg | ORAL_TABLET | Freq: Two times a day (BID) | ORAL | 0 refills | Status: DC

## 2016-10-05 MED ORDER — HYDROCODONE-HOMATROPINE 5-1.5 MG/5ML PO SYRP: 5.0000 mL | ORAL_SOLUTION | Freq: Four times a day (QID) | ORAL | 0 refills | Status: DC | PRN

## 2016-10-05 NOTE — ED Provider Notes (Signed)
[facility] Provider Note    By signing my name below, I, Earmon PhoenixJennifer Waddell, attest that this documentation has been prepared under the direction and in the presence of Ivery QualeHobson Kiaira Pointer, PA-C. Electronically Signed: Earmon PhoenixJennifer Waddell, ED Scribe. 10/05/16. 6:13 PM.    History   Chief Complaint Chief Complaint  Patient presents with  . Cough   The history is provided by the patient and medical records. No language interpreter was used.    HPI Comments:  [name] is a morbidly obese 30 y.o. female who presents to the Emergency Department complaining of cough that began three days ago. She reports associated subjective fever, sore throat and hoarseness. She has not taken anything for her symptoms. She denies any modifying factors. She denies nausea, vomiting, diarrhea or abdominal pain.   Past Medical History:  Diagnosis Date  . Abdominal pain, chronic, epigastric   . Abdominal pain, chronic, right lower quadrant   . Bulging disc   . Constipation   . Contraceptive management 07/18/2014  . Disk prolapse   . Elevated liver enzymes 12/01/2013  . Gastritis   . GERD (gastroesophageal reflux disease)   . Headache   . Herpes   . Mental disorder    anxiety  . Obesity   . Other and unspecified ovarian cyst 12/01/2013  . Ovarian cyst, right     Patient Active Problem List   Diagnosis Date Noted  . Constipation 01/01/2016  . Abdominal pain 01/01/2016  . Obesity 07/25/2015  . Vomiting and diarrhea 11/11/2014  . Acute gastroenteritis 11/11/2014  . UTI (lower urinary tract infection) 11/11/2014  . Hypokalemia 11/11/2014  . Morbid obesity (HCC) 11/11/2014  . Contraceptive management 07/18/2014  . Abdominal pain, acute 06/08/2014  . Nausea & vomiting 06/08/2014  . Dehydration 06/08/2014  . Intractable nausea and vomiting 06/08/2014  . Gastritis   . Abdominal pain, chronic, epigastric   . Abdominal pain, chronic, right lower quadrant musculoskeletal 02/06/2014  . Other  and unspecified ovarian cyst 12/05/2013  . Other and unspecified ovarian cyst 12/01/2013  . Elevated liver enzymes 12/01/2013  . Internal hemorrhoids with other complication 11/23/2013  . Unspecified constipation 08/07/2013  . Abdominal pain, other specified site 02/03/2013  . Rectal bleeding 02/03/2013  . Lumbar pain 07/14/2012  . Lumbar radiculopathy 07/14/2012  . Nausea and vomiting 01/11/2012  . Esophageal reflux 01/11/2012  . KNEE PAIN 06/13/2009  . CONTUSION, LEFT KNEE 06/13/2009    Past Surgical History:  Procedure Laterality Date  . COLONOSCOPY WITH ESOPHAGOGASTRODUODENOSCOPY (EGD) N/A 02/24/2013   ZOX:[id number]:INTERMITTENT RECTAL BLEEDING DUE TO Moderate sized internal hemorrhoids, EGD: erosive esophagitis due to uncontrolled reflux, moderate erosive gastritis, benign path  . ESOPHAGOGASTRODUODENOSCOPY  01/19/2012   BJY:NWGNFA,[id number]:ULCERS,MULTIPLE IN THE ANTRUM/HIATAL HERNIA/  . ESOPHAGOGASTRODUODENOSCOPY N/A 07/31/2016   Procedure: ESOPHAGOGASTRODUODENOSCOPY (EGD);  Surgeon: West BaliSandi L Fields, MD;  Location: [facility];  Service: Endoscopy;  Laterality: N/A;  10:15 AM  . WISDOM TOOTH EXTRACTION      OB History    Gravida Para Term Preterm AB Living   1 1 1     1    SAB TAB Ectopic Multiple Live Births           1       Home Medications    Prior to Admission medications   Medication Sig Start Date End Date Taking? Authorizing Provider  cyclobenzaprine (FLEXERIL) 10 MG tablet Take 1 tablet (10 mg total) by mouth 3 (three) times daily. 09/30/16   Ivery QualeHobson Penda Venturi, PA-C  diclofenac (VOLTAREN)  50 MG EC tablet Take 50 mg by mouth 2 (two) times daily. 05/21/16   Historical Provider, MD  DULoxetine (CYMBALTA) 60 MG capsule Take 60 mg by mouth at bedtime.    Historical Provider, MD  FIBER, CORN DEXTRIN, PO Take 1 tablet by mouth daily.    Historical Provider, MD  HYDROcodone-acetaminophen (NORCO/VICODIN) 5-325 MG tablet Take 1 tablet by mouth every 4 (four) hours as needed. 09/30/16   Ivery QualeHobson Gerhard Rappaport,  PA-C  linaclotide (LINZESS) 145 MCG CAPS capsule Take 145 mcg by mouth daily before breakfast.    Historical Provider, MD  LYRICA 150 MG capsule Take 150 mg by mouth 2 (two) times daily. 07/29/15   Historical Provider, MD  medroxyPROGESTERone (DEPO-PROVERA) 150 MG/ML injection Inject 1 mL (150 mg total) into the muscle every 3 (three) months. 06/26/16   Lazaro ArmsLuther H Eure, MD  Melatonin 5 MG TABS Take 1 tablet by mouth at bedtime.    Historical Provider, MD  Norethindrone-Ethinyl Estradiol-Fe Biphas (LO LOESTRIN FE) 1 MG-10 MCG / 10 MCG tablet Take 1 tablet by mouth daily. Take 1 daily by mouth 09/24/16   Adline PotterJennifer A Griffin, NP  pantoprazole (PROTONIX) 40 MG tablet Take 1 tablet (40 mg total) by mouth 2 (two) times daily before a meal. 01/01/16   Anice PaganiniEric A Gill, NP  tiZANidine (ZANAFLEX) 2 MG tablet Take 2 mg by mouth every 8 (eight) hours as needed for muscle spasms.  05/21/16   Historical Provider, MD  topiramate (TOPAMAX) 100 MG tablet Take 300 mg by mouth daily. 07/29/15   Historical Provider, MD  traMADol (ULTRAM) 50 MG tablet Take 1 tablet (50 mg total) by mouth every 6 (six) hours as needed. Patient taking differently: Take 50 mg by mouth every 6 (six) hours as needed for moderate pain.  05/05/16   Ivery QualeHobson Elishua Radford, PA-C  valACYclovir (VALTREX) 1000 MG tablet TAKE 1 TABLET BY MOUTH EVERY DAY 07/21/16   Adline PotterJennifer A Griffin, NP    Family History Family History  Problem Relation Age of Onset  . GER disease Mother   . Other Mother     diverticulitis; hernia  . Diabetes Mother     borderline  . Diabetes Maternal Grandmother   . COPD Maternal Grandmother   . Diabetes Maternal Grandfather   . Congestive Heart Failure Maternal Grandfather   . Headache Son   . Diabetes Other   . Colon cancer Neg Hx   . Colon polyps Neg Hx     Social History Social History  Substance Use Topics  . Smoking status: Former Smoker    Packs/day: 0.00    Years: 0.50    Types: Cigarettes    Quit date: 07/28/2011  . Smokeless  tobacco: Never Used     Comment: stopped in 2009  . Alcohol use No     Allergies   Naproxen   Review of Systems Review of Systems  Constitutional: Positive for fever (subjective).  HENT: Positive for sore throat and voice change.   Respiratory: Positive for cough.   Gastrointestinal: Negative for abdominal pain, diarrhea, nausea and vomiting.  All other systems reviewed and are negative.    Physical Exam Updated Vital Signs BP 126/73 (BP Location: Left Arm)   Pulse 95   Temp 97.9 F (36.6 C) (Oral)   Resp 22   Ht 5\' 6"  (1.676 m)   Wt (!) 325 lb (147.4 kg)   LMP 09/28/2016   SpO2 100%   BMI 52.46 kg/m   Physical Exam  Constitutional: She  is oriented to person, place, and time. She appears well-developed and well-nourished.  HENT:  Head: Normocephalic and atraumatic.  Mouth/Throat: Uvula is midline.  Hoarseness present  Neck: Normal range of motion.  Cardiovascular: Normal rate, regular rhythm and normal heart sounds.  Exam reveals no gallop and no friction rub.   No murmur heard. Pulmonary/Chest: Effort normal.  Course breath sounds present. Few scattered rhonchi. Symmetrical rise and fall of the chest. No accessory muscle use.  Abdominal: Soft. Bowel sounds are normal. There is no tenderness.  Musculoskeletal: Normal range of motion. She exhibits no edema.  Lymphadenopathy:    She has no cervical adenopathy.  Neurological: She is alert and oriented to person, place, and time.  Skin: Skin is warm and dry.  Psychiatric: She has a normal mood and affect. Her behavior is normal.  Nursing note and vitals reviewed.    ED Treatments / Results  DIAGNOSTIC STUDIES: Oxygen Saturation is 100% on RA, normal by my interpretation.   COORDINATION OF CARE: 6:11 PM- Will provide note for school. Encouraged pt to increase fluids. Will prescribe steroids and cough medication. Pt verbalizes understanding and agrees to plan.  Medications - No data to display  Labs (all  labs ordered are listed, but only abnormal results are displayed) Labs Reviewed - No data to display  EKG  EKG Interpretation None       Radiology No results found.  Procedures Procedures (including critical care time)  Medications Ordered in ED Medications - No data to display   Initial Impression / Assessment and Plan / ED Course  I have reviewed the triage vital signs and the nursing notes.  Pertinent labs & imaging results that were available during my care of the patient were reviewed by me and considered in my medical decision making (see chart for details).  Clinical Course     **I have reviewed nursing notes, vital signs, and all appropriate lab and imaging results for this patient.*  I personally performed the services described in this documentation, which was scribed in my presence. The recorded information has been reviewed and is accurate.  Final Clinical Impressions(s) / ED Diagnoses  Exam favors bronchitis and laryngitis. CXray is negative for acute problem. Pt treated with voice rest, decadron, and hycodan for cough. Pt to follow up with PCP in the office.   Final diagnoses:  Acute bronchitis due to other specified organisms  Laryngitis    New Prescriptions Discharge Medication List as of 10/05/2016  6:36 PM    START taking these medications   Details  dexamethasone (DECADRON) 4 MG tablet Take 1 tablet (4 mg total) by mouth 2 (two) times daily with a meal., Starting Mon 10/05/2016, Print    HYDROcodone-homatropine (HYCODAN) 5-1.5 MG/5ML syrup Take 5 mLs by mouth every 6 (six) hours as needed., Starting Mon 10/05/2016, Print         [place], PA-C 10/06/16 1610    Vanetta Mulders, MD 10/12/16 1343

## 2016-10-05 NOTE — Telephone Encounter (Signed)
VM to return call to reschedule missed appointment.  

## 2016-10-05 NOTE — Discharge Instructions (Signed)
Saltwater gargles maybe helpful. Please rest her voice is much as possible. Use Decadron 2 times daily with food. Use Hycodan for cough. This medication may cause drowsiness. Please do not drink, drive, or participate in activity that requires concentration while taking this medication. Please see Dr. Selena BattenKim, or return to the emergency department if not improving.

## 2016-10-05 NOTE — ED Triage Notes (Signed)
Onset 3 days ago cough, non productive, sore throat with hoarness.

## 2016-12-01 ENCOUNTER — Ambulatory Visit: Payer: Medicaid Other | Admitting: Nurse Practitioner

## 2016-12-16 ENCOUNTER — Ambulatory Visit: Payer: Medicaid Other | Admitting: Nurse Practitioner

## 2016-12-24 ENCOUNTER — Emergency Department (HOSPITAL_COMMUNITY)
Admission: EM | Admit: 2016-12-24 | Discharge: 2016-12-24 | Disposition: A | Discharge status: Home / Self Care | Payer: Medicaid Other | Attending: Emergency Medicine | Admitting: Emergency Medicine

## 2016-12-24 ENCOUNTER — Encounter (HOSPITAL_COMMUNITY): Payer: Self-pay | Admitting: *Deleted

## 2016-12-24 DIAGNOSIS — Z87891 Personal history of nicotine dependence: Secondary | ICD-10-CM | POA: Diagnosis not present

## 2016-12-24 DIAGNOSIS — Z79899 Other long term (current) drug therapy: Secondary | ICD-10-CM | POA: Insufficient documentation

## 2016-12-24 DIAGNOSIS — H6592 Unspecified nonsuppurative otitis media, left ear: Secondary | ICD-10-CM | POA: Insufficient documentation

## 2016-12-24 DIAGNOSIS — R0981 Nasal congestion: Secondary | ICD-10-CM | POA: Diagnosis present

## 2016-12-24 MED ORDER — BENZONATATE 100 MG PO CAPS: 200.0000 mg | ORAL_CAPSULE | Freq: Three times a day (TID) | ORAL | 0 refills | Status: DC | PRN

## 2016-12-24 MED ORDER — AMOXICILLIN 500 MG PO CAPS: 500.0000 mg | ORAL_CAPSULE | Freq: Three times a day (TID) | ORAL | 0 refills | Status: DC

## 2016-12-24 NOTE — ED Provider Notes (Signed)
[facility] Provider Note   CSN: [id number] Arrival date & time: 12/24/16  1507     History   Chief Complaint No chief complaint on file.   HPI [name] is a 31 y.o. female.  HPI   [name] is a 31 y.o. female who presents to the Emergency Department complaining of nasal congestion, sinus pressure, non-productive cough and sore throat.  Symptoms present for 4 days.  She has been taking OTC cold and cough medications without relief.  She also c/o generalized fatigue and generalized weakness.  She denies shortness of breath, vomiting, diarrhea, fever and chest pain.    Past Medical History:  Diagnosis Date  . Abdominal pain, chronic, epigastric   . Abdominal pain, chronic, right lower quadrant   . Bulging disc   . Constipation   . Contraceptive management 07/18/2014  . Disk prolapse   . Elevated liver enzymes 12/01/2013  . Gastritis   . GERD (gastroesophageal reflux disease)   . Headache   . Herpes   . Mental disorder    anxiety  . Obesity   . Other and unspecified ovarian cyst 12/01/2013  . Ovarian cyst, right     Patient Active Problem List   Diagnosis Date Noted  . Constipation 01/01/2016  . Abdominal pain 01/01/2016  . Obesity 07/25/2015  . Vomiting and diarrhea 11/11/2014  . Acute gastroenteritis 11/11/2014  . UTI (lower urinary tract infection) 11/11/2014  . Hypokalemia 11/11/2014  . Morbid obesity (HCC) 11/11/2014  . Contraceptive management 07/18/2014  . Abdominal pain, acute 06/08/2014  . Nausea & vomiting 06/08/2014  . Dehydration 06/08/2014  . Intractable nausea and vomiting 06/08/2014  . Gastritis   . Abdominal pain, chronic, epigastric   . Abdominal pain, chronic, right lower quadrant musculoskeletal 02/06/2014  . Other and unspecified ovarian cyst 12/05/2013  . Other and unspecified ovarian cyst 12/01/2013  . Elevated liver enzymes 12/01/2013  . Internal hemorrhoids with other complication 11/23/2013  . Unspecified  constipation 08/07/2013  . Abdominal pain, other specified site 02/03/2013  . Rectal bleeding 02/03/2013  . Lumbar pain 07/14/2012  . Lumbar radiculopathy 07/14/2012  . Nausea and vomiting 01/11/2012  . Esophageal reflux 01/11/2012  . KNEE PAIN 06/13/2009  . CONTUSION, LEFT KNEE 06/13/2009    Past Surgical History:  Procedure Laterality Date  . COLONOSCOPY WITH ESOPHAGOGASTRODUODENOSCOPY (EGD) N/A 02/24/2013   EPP:[id number]:INTERMITTENT RECTAL BLEEDING DUE TO Moderate sized internal hemorrhoids, EGD: erosive esophagitis due to uncontrolled reflux, moderate erosive gastritis, benign path  . ESOPHAGOGASTRODUODENOSCOPY  01/19/2012   TKZ:[id number]:ULCERS,MULTIPLE IN THE ANTRUM/HIATAL HERNIA/  . ESOPHAGOGASTRODUODENOSCOPY N/A 07/31/2016   Procedure: ESOPHAGOGASTRODUODENOSCOPY (EGD);  Surgeon: West BaliSandi L Fields, MD;  Location: [facility];  Service: Endoscopy;  Laterality: N/A;  10:15 AM  . WISDOM TOOTH EXTRACTION      OB History    Gravida Para Term Preterm AB Living   1 1 1     1    SAB TAB Ectopic Multiple Live Births           1       Home Medications    Prior to Admission medications   Medication Sig Start Date End Date Taking? Authorizing Provider  cyclobenzaprine (FLEXERIL) 10 MG tablet Take 1 tablet (10 mg total) by mouth 3 (three) times daily. 09/30/16   Ivery QualeHobson Bryant, PA-C  dexamethasone (DECADRON) 4 MG tablet Take 1 tablet (4 mg total) by mouth 2 (two) times daily with a meal. 10/05/16   Ivery QualeHobson Bryant, PA-C  diclofenac (VOLTAREN)  50 MG EC tablet Take 50 mg by mouth 2 (two) times daily. 05/21/16   Historical Provider, MD  DULoxetine (CYMBALTA) 60 MG capsule Take 60 mg by mouth at bedtime.    Historical Provider, MD  FIBER, CORN DEXTRIN, PO Take 1 tablet by mouth daily.    Historical Provider, MD  HYDROcodone-acetaminophen (NORCO/VICODIN) 5-325 MG tablet Take 1 tablet by mouth every 4 (four) hours as needed. 09/30/16   Ivery Quale, PA-C  HYDROcodone-homatropine (HYCODAN) 5-1.5 MG/5ML syrup Take 5  mLs by mouth every 6 (six) hours as needed. 10/05/16   Ivery Quale, PA-C  linaclotide (LINZESS) 145 MCG CAPS capsule Take 145 mcg by mouth daily before breakfast.    Historical Provider, MD  LYRICA 150 MG capsule Take 150 mg by mouth 2 (two) times daily. 07/29/15   Historical Provider, MD  medroxyPROGESTERone (DEPO-PROVERA) 150 MG/ML injection Inject 1 mL (150 mg total) into the muscle every 3 (three) months. 06/26/16   Lazaro Arms, MD  Melatonin 5 MG TABS Take 1 tablet by mouth at bedtime.    Historical Provider, MD  Norethindrone-Ethinyl Estradiol-Fe Biphas (LO LOESTRIN FE) 1 MG-10 MCG / 10 MCG tablet Take 1 tablet by mouth daily. Take 1 daily by mouth 09/24/16   Adline Potter, NP  pantoprazole (PROTONIX) 40 MG tablet Take 1 tablet (40 mg total) by mouth 2 (two) times daily before a meal. 01/01/16   Anice Paganini, NP  tiZANidine (ZANAFLEX) 2 MG tablet Take 2 mg by mouth every 8 (eight) hours as needed for muscle spasms.  05/21/16   Historical Provider, MD  topiramate (TOPAMAX) 100 MG tablet Take 300 mg by mouth daily. 07/29/15   Historical Provider, MD  traMADol (ULTRAM) 50 MG tablet Take 1 tablet (50 mg total) by mouth every 6 (six) hours as needed. Patient taking differently: Take 50 mg by mouth every 6 (six) hours as needed for moderate pain.  05/05/16   Ivery Quale, PA-C  valACYclovir (VALTREX) 1000 MG tablet TAKE 1 TABLET BY MOUTH EVERY DAY 07/21/16   Adline Potter, NP    Family History Family History  Problem Relation Age of Onset  . GER disease Mother   . Other Mother     diverticulitis; hernia  . Diabetes Mother     borderline  . Diabetes Maternal Grandmother   . COPD Maternal Grandmother   . Diabetes Maternal Grandfather   . Congestive Heart Failure Maternal Grandfather   . Headache Son   . Diabetes Other   . Colon cancer Neg Hx   . Colon polyps Neg Hx     Social History Social History  Substance Use Topics  . Smoking status: Former Smoker    Packs/day: 0.00     Years: 0.50    Types: Cigarettes    Quit date: 07/28/2011  . Smokeless tobacco: Never Used     Comment: stopped in 2009  . Alcohol use No     Allergies   Naproxen   Review of Systems Review of Systems  Constitutional: Negative for activity change, appetite change, chills and fever.  HENT: Positive for congestion, ear pain, rhinorrhea, sinus pressure and sore throat. Negative for facial swelling and trouble swallowing.   Eyes: Negative for visual disturbance.  Respiratory: Positive for cough. Negative for chest tightness, shortness of breath, wheezing and stridor.   Cardiovascular: Negative for chest pain.  Gastrointestinal: Negative for abdominal pain, nausea and vomiting.  Genitourinary: Negative for dysuria.  Musculoskeletal: Negative for neck pain and neck  stiffness.  Skin: Negative.   Neurological: Negative for dizziness, weakness, numbness and headaches.  Hematological: Negative for adenopathy.  Psychiatric/Behavioral: Negative for confusion.  All other systems reviewed and are negative.    Physical Exam Updated Vital Signs BP 120/65   Pulse 97   Temp 97.8 F (36.6 C) (Oral)   Resp 16   Ht 5\' 6"  (1.676 m)   Wt (!) 139.7 kg   SpO2 100%   BMI 49.71 kg/m   Physical Exam  Constitutional: She is oriented to person, place, and time. She appears well-developed and well-nourished. No distress.  HENT:  Head: Normocephalic and atraumatic.  Right Ear: Tympanic membrane and ear canal normal.  Left Ear: Ear canal normal. There is tenderness. No drainage or swelling. No mastoid tenderness. Tympanic membrane is erythematous. A middle ear effusion is present. No decreased hearing is noted.  Nose: Mucosal edema and rhinorrhea present.  Mouth/Throat: Uvula is midline and mucous membranes are normal. No trismus in the jaw. No uvula swelling. No oropharyngeal exudate, posterior oropharyngeal edema, posterior oropharyngeal erythema or tonsillar abscesses. No tonsillar exudate.    Eyes: Conjunctivae are normal.  Neck: Normal range of motion and phonation normal. Neck supple. No Brudzinski's sign and no Kernig's sign noted.  Cardiovascular: Normal rate, regular rhythm, normal heart sounds and intact distal pulses.   No murmur heard. Pulmonary/Chest: Effort normal and breath sounds normal. No respiratory distress. She has no wheezes. She has no rales.  Abdominal: Soft. She exhibits no distension. There is no tenderness. There is no rebound and no guarding.  Musculoskeletal: Normal range of motion. She exhibits no edema.  Lymphadenopathy:    She has no cervical adenopathy.  Neurological: She is alert and oriented to person, place, and time. She exhibits normal muscle tone. Coordination normal.  Skin: Skin is warm and dry. No rash noted.  Nursing note and vitals reviewed.    ED Treatments / Results  Labs (all labs ordered are listed, but only abnormal results are displayed) Labs Reviewed - No data to display  EKG  EKG Interpretation None       Radiology No results found.  Procedures Procedures (including critical care time)  Medications Ordered in ED Medications - No data to display   Initial Impression / Assessment and Plan / ED Course  I have reviewed the triage vital signs and the nursing notes.  Pertinent labs & imaging results that were available during my care of the patient were reviewed by me and considered in my medical decision making (see chart for details).     Vitals stable.  Non-toxic appearing.  Left OM w/o perforation.  Will tx with amoxil and anti-tussive.  Agrees to tylenol and ibuprofen if needed for fever.  Appears stable for d/c  Final Clinical Impressions(s) / ED Diagnoses   Final diagnoses:  Left non-suppurative otitis media    New Prescriptions New Prescriptions   No medications on file     Rosey Bath 12/24/16 1625    Marily Memos, MD 12/24/16 2325

## 2016-12-24 NOTE — ED Triage Notes (Signed)
Pt c/o congested cough, left ear ache, nasal drainage, sore throat, slight weakness. Denies fever.

## 2016-12-24 NOTE — Discharge Instructions (Signed)
Tylenol or ibuprofen every 4-6 hrs if needed for fever.  Follow-up with your doctor for recheck if needed.

## 2016-12-25 ENCOUNTER — Ambulatory Visit: Payer: Medicaid Other | Admitting: Adult Health

## 2016-12-28 ENCOUNTER — Ambulatory Visit: Payer: Medicaid Other | Admitting: Nurse Practitioner

## 2017-01-12 ENCOUNTER — Ambulatory Visit (INDEPENDENT_AMBULATORY_CARE_PROVIDER_SITE_OTHER): Payer: Medicaid Other | Admitting: Adult Health

## 2017-01-12 ENCOUNTER — Encounter: Payer: Self-pay | Admitting: Adult Health

## 2017-01-12 VITALS — BP 100/68 | HR 78 | Ht 66.0 in | Wt 328.5 lb

## 2017-01-12 DIAGNOSIS — Z30013 Encounter for initial prescription of injectable contraceptive: Secondary | ICD-10-CM

## 2017-01-12 DIAGNOSIS — Z3202 Encounter for pregnancy test, result negative: Secondary | ICD-10-CM | POA: Diagnosis not present

## 2017-01-12 LAB — POCT URINE PREGNANCY: PREG TEST UR: NEGATIVE

## 2017-01-12 MED ORDER — MEDROXYPROGESTERONE ACETATE 150 MG/ML IM SUSP: 150.0000 mg | INTRAMUSCULAR | 4 refills | Status: DC

## 2017-01-12 NOTE — Patient Instructions (Signed)
Continue OCs Get depo in 3 days

## 2017-01-12 NOTE — Progress Notes (Addendum)
Subjective:     Patient ID: [name], female   DOB: 02-22-1986, 31 y.o.   MRN: [id number]  HPI [name] is a 31 year old white female, single, G1P1, in to switch back to depo, forgets to take OCs.  Review of Systems Patient denies any headaches, hearing loss, fatigue, blurred vision, shortness of breath, chest pain, abdominal pain, problems with bowel movements, urination, or intercourse(not currently having sex). No joint pain or mood swings. Reviewed past medical,surgical, social and family history. Reviewed medications and allergies.     Objective:   Physical Exam BP 100/68 (BP Location: Left Arm, Patient Position: Sitting, Cuff Size: Large)   Pulse 78   Ht 5\' 6"  (1.676 m)   Wt (!) 328 lb 8 oz (149 kg)   BMI 53.02 kg/m UPT negative,Skin warm and dry. Lungs: clear to ausculation bilaterally. Cardiovascular: regular rate and rhythm.    Assessment:     1. Encounter for initial prescription of injectable contraceptive   2. Pregnancy examination or test, negative result       Plan:     Meds ordered this encounter  Medications  . medroxyPROGESTERone (DEPO-PROVERA) 150 MG/ML injection    Sig: Inject 1 mL (150 mg total) into the muscle every 3 (three) months.    Dispense:  1 mL    Refill:  4    Order Specific Question:   Supervising Provider    Answer:   Duane LopeEURE, LUTHER H [[id number]]     Continue OCs Get depo in 3 days

## 2017-01-13 ENCOUNTER — Ambulatory Visit (INDEPENDENT_AMBULATORY_CARE_PROVIDER_SITE_OTHER): Payer: Medicaid Other | Admitting: Nurse Practitioner

## 2017-01-13 ENCOUNTER — Encounter: Payer: Self-pay | Admitting: Nurse Practitioner

## 2017-01-13 VITALS — BP 139/85 | HR 94 | Temp 98.1°F | Ht 66.0 in | Wt 326.2 lb

## 2017-01-13 DIAGNOSIS — K2950 Unspecified chronic gastritis without bleeding: Secondary | ICD-10-CM | POA: Diagnosis not present

## 2017-01-13 DIAGNOSIS — E669 Obesity, unspecified: Secondary | ICD-10-CM

## 2017-01-13 DIAGNOSIS — Z6841 Body Mass Index (BMI) 40.0 and over, adult: Secondary | ICD-10-CM | POA: Diagnosis not present

## 2017-01-13 DIAGNOSIS — K219 Gastro-esophageal reflux disease without esophagitis: Secondary | ICD-10-CM | POA: Diagnosis not present

## 2017-01-13 DIAGNOSIS — IMO0001 Reserved for inherently not codable concepts without codable children: Secondary | ICD-10-CM

## 2017-01-13 MED ORDER — PANTOPRAZOLE SODIUM 40 MG PO TBEC: 40.0000 mg | DELAYED_RELEASE_TABLET | Freq: Two times a day (BID) | ORAL | 11 refills | Status: DC

## 2017-01-13 NOTE — Assessment & Plan Note (Signed)
Noted morbid obesity likely is significant and contributor to her persistent GERD/gastritis. GERD and gastritis management as per above. She was referred to nutrition and has seen them and felt that she learn somethings from that visit. [place] and neck supple weeks. They have started working on paperwork as well. Continue with the process for possible weight loss reduction surgery.

## 2017-01-13 NOTE — Progress Notes (Signed)
Referring Provider: Pearson Grippe, MD Primary Care Physician:  Pearson Grippe, MD Primary GI:  Dr. Darrick Penna  Chief Complaint  Patient presents with  . Gastroesophageal Reflux    f/u, no problems; needs Protonix refill    HPI:   [name] is a 31 y.o. female who presents For follow-up on GERD. The patient was last seen in our office 07/28/2016 for same. At that time doing okay overall, constipation resolved on Linzess 72 g, taking Protonix daily with some breakthrough noted every day with worsening of symptoms. No NSAIDs or aspirin powders. EGD last in 3 years ago with uncontrolled GERD and gastritis due to NSAIDs. She was put on Protonix twice daily at that time. Scheduled her for upper endoscopy, send in prescription for viscous lidocaine as needed for breakthrough symptoms, increase Protonix to twice a day. Referred to dietitian/nutritionist for weight loss.  EGD was completed 07/31/2016 which found for os in the esophagus status post biopsy, diffuse moderate inflammation in the gastric antrum status post biopsy. Recommended Protonix twice daily, return for follow-up in 4 months. Surgical pathology as per below. Biopsies suggest uncontrolled GERD. Recommended follow left stomach of occasions, take medications, high-fiber/low-fat diet, nutrition referral, elevate head of bed on 6 inch blocks, avoid lying down after meals, no bedtime snacks, avoid triggers. Referral to [facility] to discuss possible weight reduction surgery.  Today she states she's doing well. Needs refill on Protonix today. Has an appointment with bariatric surgeon in [place] in the next couple weeks. GERD symptoms much improved. Denies abdominal pain, N/V, hematochezia, melena. Denies chest pain, dyspnea, dizziness, lightheadedness, syncope, near syncope. Denies any other upper or lower GI symptoms.    Past Medical History:  Diagnosis Date  . Abdominal pain, chronic, epigastric   . Abdominal pain, chronic, right lower  quadrant   . Bulging disc   . Constipation   . Contraceptive management 07/18/2014  . Disk prolapse   . Elevated liver enzymes 12/01/2013  . Gastritis   . GERD (gastroesophageal reflux disease)   . Headache   . Herpes   . Mental disorder    anxiety  . Obesity   . Other and unspecified ovarian cyst 12/01/2013  . Ovarian cyst, right     Past Surgical History:  Procedure Laterality Date  . COLONOSCOPY WITH ESOPHAGOGASTRODUODENOSCOPY (EGD) N/A 02/24/2013   WUJ:[id number] RECTAL BLEEDING DUE TO Moderate sized internal hemorrhoids, EGD: erosive esophagitis due to uncontrolled reflux, moderate erosive gastritis, benign path  . ESOPHAGOGASTRODUODENOSCOPY  01/19/2012   OZH:YQMVHQ,IONGEXBM IN THE ANTRUM/HIATAL HERNIA/  . ESOPHAGOGASTRODUODENOSCOPY N/A 07/31/2016   Procedure: ESOPHAGOGASTRODUODENOSCOPY (EGD);  Surgeon: West Bali, MD;  Location: [facility];  Service: Endoscopy;  Laterality: N/A;  10:15 AM  . WISDOM TOOTH EXTRACTION      Current Outpatient Prescriptions  Medication Sig Dispense Refill  . DULoxetine (CYMBALTA) 60 MG capsule Take 60 mg by mouth at bedtime.    Marland Kitchen FIBER, CORN DEXTRIN, PO Take 1 tablet by mouth daily.    Marland Kitchen linaclotide (LINZESS) 145 MCG CAPS capsule Take 145 mcg by mouth daily before breakfast.    . LYRICA 150 MG capsule Take 150 mg by mouth 2 (two) times daily.  2  . Melatonin 5 MG TABS Take 1 tablet by mouth at bedtime.    . pantoprazole (PROTONIX) 40 MG tablet Take 1 tablet (40 mg total) by mouth 2 (two) times daily before a meal. 60 tablet 11  . tiZANidine (ZANAFLEX) 2 MG tablet Take 2 mg by  mouth every 8 (eight) hours as needed for muscle spasms.   2  . topiramate (TOPAMAX) 100 MG tablet Take 300 mg by mouth daily.  2  . traMADol (ULTRAM) 50 MG tablet Take 1 tablet (50 mg total) by mouth every 6 (six) hours as needed. (Patient taking differently: Take 50 mg by mouth every 6 (six) hours as needed for moderate pain. ) 15 tablet 0  . valACYclovir (VALTREX)  1000 MG tablet TAKE 1 TABLET BY MOUTH EVERY DAY 30 tablet 11   No current facility-administered medications for this visit.     Allergies as of 01/13/2017 - Review Complete 01/13/2017  Allergen Reaction Noted  . Naproxen Other (See Comments) 05/05/2016    Family History  Problem Relation Age of Onset  . GER disease Mother   . Other Mother     diverticulitis; hernia  . Diabetes Mother     borderline  . Diabetes Maternal Grandmother   . COPD Maternal Grandmother   . Diabetes Maternal Grandfather   . Congestive Heart Failure Maternal Grandfather   . Headache Son   . Diabetes Other   . Colon cancer Neg Hx   . Colon polyps Neg Hx     Social History   Social History  . Marital status: Single    Spouse name: N/A  . Number of children: 1  . Years of education: N/A   Occupational History  .  Work Personal assistantirst  . Unemployed    Social History Main Topics  . Smoking status: Former Smoker    Packs/day: 0.00    Years: 0.50    Types: Cigarettes    Quit date: 07/28/2011  . Smokeless tobacco: Never Used     Comment: stopped in 2009  . Alcohol use No  . Drug use: No  . Sexual activity: Not Currently    Birth control/ protection: Pill   Other Topics Concern  . None   Social History Narrative  . None    Review of Systems: Complete ROS negative except as per HPI.   Physical Exam: BP 139/85   Pulse 94   Temp 98.1 F (36.7 C) (Oral)   Ht 5\' 6"  (1.676 m)   Wt (!) 326 lb 3.2 oz (148 kg)   BMI 52.65 kg/m  General:   Morbidly obese female. Alert and oriented. Pleasant and cooperative. Well-nourished and well-developed.  Eyes:  Without icterus, sclera clear and conjunctiva pink.  Ears:  Normal auditory acuity. Cardiovascular:  S1, S2 present without murmurs appreciated. Extremities without clubbing or edema. Respiratory:  Clear to auscultation bilaterally. No wheezes, rales, or rhonchi. No distress.  Gastrointestinal:  +BS, obese but soft, non-tender and non-distended. No HSM  noted. No guarding or rebound. No masses appreciated.  Rectal:  Deferred  Musculoskalatal:  Symmetrical without gross deformities. Neurologic:  Alert and oriented x4;  grossly normal neurologically. Psych:  Alert and cooperative. Normal mood and affect. Heme/Lymph/Immune: No excessive bruising noted.    01/13/2017 2:49 PM   Disclaimer: This note was dictated with voice recognition software. Similar sounding words can inadvertently be transcribed and may not be corrected upon review.

## 2017-01-13 NOTE — Assessment & Plan Note (Signed)
Noted gastritis and GERD symptoms with frequent EGDs. Last endoscopy showed persistent gastritis. Status post biopsy confirming uncontrolled gastritis. Protonix is controlling her symptoms well currently. I will refill her Protonix that she ran out this morning. She has an appointment scheduled to see bariatric surgery in the next couple weeks. Return for follow-up in 6 months.

## 2017-01-13 NOTE — Patient Instructions (Signed)
1. Continue taking her Protonix. I have sent a refill to your pharmacy. 2. Keep your appointment with weight loss reduction surgery team. 3. Return for follow-up in 6 months. 4. Call us if you have any worsening symptoms.

## 2017-01-14 NOTE — Progress Notes (Signed)
CC'ED TO PCP 

## 2017-01-15 ENCOUNTER — Ambulatory Visit (INDEPENDENT_AMBULATORY_CARE_PROVIDER_SITE_OTHER): Payer: Medicaid Other

## 2017-01-15 VITALS — Wt 328.0 lb

## 2017-01-15 DIAGNOSIS — Z3042 Encounter for surveillance of injectable contraceptive: Secondary | ICD-10-CM | POA: Diagnosis not present

## 2017-01-15 DIAGNOSIS — Z3202 Encounter for pregnancy test, result negative: Secondary | ICD-10-CM

## 2017-01-15 LAB — POCT URINE PREGNANCY: PREG TEST UR: NEGATIVE

## 2017-01-15 MED ORDER — MEDROXYPROGESTERONE ACETATE 150 MG/ML IM SUSP
150.0000 mg | Freq: Once | INTRAMUSCULAR | Status: AC
  Administered 2017-01-15: 150 mg via INTRAMUSCULAR

## 2017-01-15 NOTE — Progress Notes (Signed)
PT here for Depo Shot 150 mg IM given LT Delltoid. Tolerated well. Well return 12 week for next shot. P Dones CMA

## 2017-01-28 ENCOUNTER — Encounter (HOSPITAL_COMMUNITY): Payer: Self-pay | Admitting: *Deleted

## 2017-01-28 ENCOUNTER — Emergency Department (HOSPITAL_COMMUNITY)
Admission: EM | Admit: 2017-01-28 | Discharge: 2017-01-28 | Disposition: A | Discharge status: Home / Self Care | Payer: Medicaid Other | Attending: Emergency Medicine | Admitting: Emergency Medicine

## 2017-01-28 DIAGNOSIS — Z79899 Other long term (current) drug therapy: Secondary | ICD-10-CM | POA: Insufficient documentation

## 2017-01-28 DIAGNOSIS — R197 Diarrhea, unspecified: Secondary | ICD-10-CM | POA: Diagnosis not present

## 2017-01-28 DIAGNOSIS — Z87891 Personal history of nicotine dependence: Secondary | ICD-10-CM | POA: Diagnosis not present

## 2017-01-28 DIAGNOSIS — R1010 Upper abdominal pain, unspecified: Secondary | ICD-10-CM | POA: Insufficient documentation

## 2017-01-28 DIAGNOSIS — R112 Nausea with vomiting, unspecified: Secondary | ICD-10-CM | POA: Diagnosis not present

## 2017-01-28 LAB — LIPASE, BLOOD: Lipase: 25 U/L (ref 11–51)

## 2017-01-28 LAB — COMPREHENSIVE METABOLIC PANEL
ALBUMIN: 3.9 g/dL (ref 3.5–5.0)
ALT: 23 U/L (ref 14–54)
ANION GAP: 7 (ref 5–15)
AST: 51 U/L — ABNORMAL HIGH (ref 15–41)
Alkaline Phosphatase: 92 U/L (ref 38–126)
BUN: 7 mg/dL (ref 6–20)
CHLORIDE: 114 mmol/L — AB (ref 101–111)
CO2: 19 mmol/L — AB (ref 22–32)
Calcium: 9 mg/dL (ref 8.9–10.3)
Creatinine, Ser: 0.89 mg/dL (ref 0.44–1.00)
GFR calc Af Amer: >60 mL/min (ref >60)
GFR calc non Af Amer: >60 mL/min (ref >60)
GLUCOSE: 111 mg/dL — AB (ref 65–99)
POTASSIUM: 3.4 mmol/L — AB (ref 3.5–5.1)
SODIUM: 140 mmol/L (ref 135–145)
Total Bilirubin: 0.6 mg/dL (ref 0.3–1.2)
Total Protein: 7.5 g/dL (ref 6.5–8.1)

## 2017-01-28 LAB — CBC
HEMATOCRIT: 41.4 % (ref 36.0–46.0)
HEMOGLOBIN: 14.3 g/dL (ref 12.0–15.0)
MCH: 30.1 pg (ref 26.0–34.0)
MCHC: 34.5 g/dL (ref 30.0–36.0)
MCV: 87.2 fL (ref 78.0–100.0)
Platelets: 221 10*3/uL (ref 150–400)
RBC: 4.75 MIL/uL (ref 3.87–5.11)
RDW: 14.9 % (ref 11.5–15.5)
WBC: 6.7 10*3/uL (ref 4.0–10.5)

## 2017-01-28 LAB — URINALYSIS, ROUTINE W REFLEX MICROSCOPIC
BILIRUBIN URINE: NEGATIVE
Glucose, UA: NEGATIVE mg/dL
Hgb urine dipstick: NEGATIVE
Ketones, ur: 5 mg/dL — AB
NITRITE: NEGATIVE
Protein, ur: 30 mg/dL — AB
SPECIFIC GRAVITY, URINE: 1.026 (ref 1.005–1.030)
pH: 5 (ref 5.0–8.0)

## 2017-01-28 MED ORDER — SODIUM CHLORIDE 0.9 % IV BOLUS (SEPSIS)
1000.0000 mL | Freq: Once | INTRAVENOUS | Status: AC
  Administered 2017-01-28: 1000 mL via INTRAVENOUS

## 2017-01-28 MED ORDER — ACETAMINOPHEN 325 MG PO TABS
650.0000 mg | ORAL_TABLET | Freq: Once | ORAL | Status: AC
  Administered 2017-01-28: 650 mg via ORAL

## 2017-01-28 MED ORDER — PROCHLORPERAZINE EDISYLATE 5 MG/ML IJ SOLN
10.0000 mg | Freq: Once | INTRAMUSCULAR | Status: AC
  Administered 2017-01-28: 10 mg via INTRAVENOUS
  Filled 2017-01-28: qty 2

## 2017-01-28 MED ORDER — ACETAMINOPHEN 325 MG PO TABS
ORAL_TABLET | ORAL | Status: AC
  Filled 2017-01-28: qty 2

## 2017-01-28 MED ORDER — ONDANSETRON 4 MG PO TBDP: 4.0000 mg | ORAL_TABLET | Freq: Three times a day (TID) | ORAL | 0 refills | Status: DC | PRN

## 2017-01-28 NOTE — ED Provider Notes (Signed)
[facility] Provider Note   CSN: [id number] Arrival date & time: 01/28/17  1404     History   Chief Complaint Chief Complaint  Patient presents with  . Abdominal Pain  . Emesis    HPI [name] is a 31 y.o. female.  HPI  Patient presents with her mother who assists with the history of present illness. Patient states that she was well until last night. Over the past 24 hours patient has had innumerable episodes of diarrhea, as well as nausea, vomiting. There is some associated midline upper abdominal pain, no lateral abdominal pain, no fever. Patient identifies one a possible contaminated food that she ate yesterday, otherwise no notable precipitant, or clear change in her recent history. Since onset, she has been persistently nauseous, uncomfortable, with no clear alleviating or exacerbating factors.   Past Medical History:  Diagnosis Date  . Abdominal pain, chronic, epigastric   . Abdominal pain, chronic, right lower quadrant   . Bulging disc   . Constipation   . Contraceptive management 07/18/2014  . Disk prolapse   . Elevated liver enzymes 12/01/2013  . Gastritis   . GERD (gastroesophageal reflux disease)   . Headache   . Herpes   . Mental disorder    anxiety  . Obesity   . Other and unspecified ovarian cyst 12/01/2013  . Ovarian cyst, right     Patient Active Problem List   Diagnosis Date Noted  . Constipation 01/01/2016  . Abdominal pain 01/01/2016  . Obesity 07/25/2015  . Vomiting and diarrhea 11/11/2014  . Acute gastroenteritis 11/11/2014  . UTI (lower urinary tract infection) 11/11/2014  . Hypokalemia 11/11/2014  . Morbid obesity (HCC) 11/11/2014  . Contraceptive management 07/18/2014  . Abdominal pain, acute 06/08/2014  . Nausea & vomiting 06/08/2014  . Dehydration 06/08/2014  . Intractable nausea and vomiting 06/08/2014  . Gastritis   . Abdominal pain, chronic, epigastric   . Abdominal pain, chronic, right lower quadrant  musculoskeletal 02/06/2014  . Other and unspecified ovarian cyst 12/05/2013  . Other and unspecified ovarian cyst 12/01/2013  . Elevated liver enzymes 12/01/2013  . Internal hemorrhoids with other complication 11/23/2013  . Unspecified constipation 08/07/2013  . Abdominal pain, other specified site 02/03/2013  . Rectal bleeding 02/03/2013  . Lumbar pain 07/14/2012  . Lumbar radiculopathy 07/14/2012  . Nausea and vomiting 01/11/2012  . Esophageal reflux 01/11/2012  . KNEE PAIN 06/13/2009  . CONTUSION, LEFT KNEE 06/13/2009    Past Surgical History:  Procedure Laterality Date  . COLONOSCOPY WITH ESOPHAGOGASTRODUODENOSCOPY (EGD) N/A 02/24/2013   WUJ:WJXBJYNWGNFA RECTAL BLEEDING DUE TO Moderate sized internal hemorrhoids, EGD: erosive esophagitis due to uncontrolled reflux, moderate erosive gastritis, benign path  . ESOPHAGOGASTRODUODENOSCOPY  01/19/2012   OZH:YQMVHQ,IONGEXBM IN THE ANTRUM/HIATAL HERNIA/  . ESOPHAGOGASTRODUODENOSCOPY N/A 07/31/2016   Procedure: ESOPHAGOGASTRODUODENOSCOPY (EGD);  Surgeon: West Bali, MD;  Location: [facility];  Service: Endoscopy;  Laterality: N/A;  10:15 AM  . WISDOM TOOTH EXTRACTION      OB History    Gravida Para Term Preterm AB Living   1 1 1     1    SAB TAB Ectopic Multiple Live Births           1       Home Medications    Prior to Admission medications   Medication Sig Start Date End Date Taking? Authorizing Provider  DULoxetine (CYMBALTA) 60 MG capsule Take 60 mg by mouth at bedtime.   Yes Historical Provider, MD  FIBER,  CORN DEXTRIN, PO Take 1 tablet by mouth daily.   Yes Historical Provider, MD  linaclotide (LINZESS) 145 MCG CAPS capsule Take 145 mcg by mouth daily before breakfast.   Yes Historical Provider, MD  LYRICA 150 MG capsule Take 150 mg by mouth 2 (two) times daily. 07/29/15  Yes Historical Provider, MD  medroxyPROGESTERone (DEPO-PROVERA) 150 MG/ML injection Inject 1 mL into the muscle every 3 (three) months. 01/12/17  Yes  Historical Provider, MD  Melatonin 5 MG TABS Take 1 tablet by mouth at bedtime.   Yes Historical Provider, MD  ondansetron (ZOFRAN) 4 MG tablet Take 4 mg by mouth every 8 (eight) hours as needed for nausea or vomiting.   Yes Historical Provider, MD  pantoprazole (PROTONIX) 40 MG tablet Take 1 tablet (40 mg total) by mouth 2 (two) times daily before a meal. 01/13/17  Yes Anice PaganiniEric A Gill, NP  tiZANidine (ZANAFLEX) 2 MG tablet Take 2 mg by mouth every 8 (eight) hours as needed for muscle spasms.  05/21/16  Yes Historical Provider, MD  topiramate (TOPAMAX) 100 MG tablet Take 300 mg by mouth daily. 07/29/15  Yes Historical Provider, MD  traMADol (ULTRAM) 50 MG tablet Take 1 tablet (50 mg total) by mouth every 6 (six) hours as needed. Patient taking differently: Take 50 mg by mouth every 6 (six) hours as needed for moderate pain.  05/05/16  Yes Ivery QualeHobson Bryant, PA-C  valACYclovir (VALTREX) 1000 MG tablet TAKE 1 TABLET BY MOUTH EVERY DAY 07/21/16  Yes Adline PotterJennifer A Griffin, NP  pravastatin (PRAVACHOL) 40 MG tablet Take 40 mg by mouth at bedtime. 01/20/17   Historical Provider, MD    Family History Family History  Problem Relation Age of Onset  . GER disease Mother   . Other Mother     diverticulitis; hernia  . Diabetes Mother     borderline  . Diabetes Maternal Grandmother   . COPD Maternal Grandmother   . Diabetes Maternal Grandfather   . Congestive Heart Failure Maternal Grandfather   . Headache Son   . Diabetes Other   . Colon cancer Neg Hx   . Colon polyps Neg Hx     Social History Social History  Substance Use Topics  . Smoking status: Former Smoker    Packs/day: 0.00    Years: 0.50    Types: Cigarettes    Quit date: 07/28/2011  . Smokeless tobacco: Never Used     Comment: stopped in 2009  . Alcohol use No     Allergies   Naproxen   Review of Systems Review of Systems  Constitutional:       Per HPI, otherwise negative  HENT:       Per HPI, otherwise negative  Respiratory:       Per  HPI, otherwise negative  Cardiovascular:       Per HPI, otherwise negative  Gastrointestinal: Positive for diarrhea, nausea and vomiting.  Endocrine:       Negative aside from HPI  Genitourinary:       Neg aside from HPI   Musculoskeletal:       Per HPI, otherwise negative  Skin: Negative.   Neurological: Negative for syncope.     Physical Exam Updated Vital Signs BP 110/65   Pulse (!) 125   Temp 98.5 F (36.9 C) (Oral)   Resp 18   Ht 5\' 6"  (1.676 m)   Wt (!) 326 lb (147.9 kg)   LMP 01/11/2017   SpO2 98%   BMI 52.62 kg/m  Physical Exam  Constitutional: She is oriented to person, place, and time. She appears well-developed and well-nourished.  Obese young female sitting upright speaking clearly  HENT:  Head: Normocephalic and atraumatic.  Eyes: Conjunctivae and EOM are normal.  Cardiovascular: Normal rate and regular rhythm.   Pulmonary/Chest: Effort normal and breath sounds normal. No stridor. No respiratory distress.  Abdominal: She exhibits no distension.  Mild midline abdominal tenderness to palpation, no guarding, no rebound, no peritoneal findings  Musculoskeletal: She exhibits no edema.  Neurological: She is alert and oriented to person, place, and time. No cranial nerve deficit.  Skin: Skin is warm and dry.  Psychiatric: She has a normal mood and affect.  Nursing note and vitals reviewed.    ED Treatments / Results  Labs (all labs ordered are listed, but only abnormal results are displayed) Labs Reviewed  COMPREHENSIVE METABOLIC PANEL - Abnormal; Notable for the following:       Result Value   Potassium 3.4 (*)    Chloride 114 (*)    CO2 19 (*)    Glucose, Bld 111 (*)    AST 51 (*)    All other components within normal limits  URINALYSIS, ROUTINE W REFLEX MICROSCOPIC - Abnormal; Notable for the following:    APPearance HAZY (*)    Ketones, ur 5 (*)    Protein, ur 30 (*)    Leukocytes, UA TRACE (*)    Bacteria, UA RARE (*)    All other  components within normal limits  LIPASE, BLOOD  CBC     Procedures Procedures (including critical care time)  Medications Ordered in ED Medications  sodium chloride 0.9 % bolus 1,000 mL (0 mLs Intravenous Stopped 01/28/17 1754)  acetaminophen (TYLENOL) tablet 650 mg (650 mg Oral Given 01/28/17 1704)  prochlorperazine (COMPAZINE) injection 10 mg (10 mg Intravenous Given 01/28/17 1755)  sodium chloride 0.9 % bolus 1,000 mL (0 mLs Intravenous Stopped 01/28/17 1852)     Initial Impression / Assessment and Plan / ED Course  I have reviewed the triage vital signs and the nursing notes.  Pertinent labs & imaging results that were available during my care of the patient were reviewed by me and considered in my medical decision making (see chart for details).  On repeat exam the patient is better, no additional diarrhea, one episode of vomiting.   Update:, Now following 2 L fluid resuscitation, Compazine, patient has had no additional episodes of vomiting, nor diarrhea, has been sleeping. With reassuring labs, vitals, improvement, she'll be discharged to follow-up as an outpatient. With no evidence of peritonitis, bacteremia, sepsis, low suspicion for new intra-abdominal acute processes.   Final Clinical Impressions(s) / ED Diagnoses   Final diagnoses:  Nausea vomiting and diarrhea    New Prescriptions New Prescriptions   ONDANSETRON (ZOFRAN ODT) 4 MG DISINTEGRATING TABLET    Take 1 tablet (4 mg total) by mouth every 8 (eight) hours as needed for nausea or vomiting.     Gerhard Munch, MD 01/28/17 [id number]

## 2017-01-28 NOTE — ED Notes (Signed)
Pt ambulatory to waiting room. Pt verbalized understanding of discharge instructions.   

## 2017-01-28 NOTE — ED Triage Notes (Addendum)
Pt c/o abdominal pain, vomiting, diarrhea, headache that started last night. Pt took Zofran from home this morning with no relief.

## 2017-01-28 NOTE — ED Notes (Signed)
Pt very upset all she was receiving for her HA was Tylenol

## 2017-02-28 ENCOUNTER — Emergency Department (HOSPITAL_COMMUNITY)
Admission: EM | Admit: 2017-02-28 | Discharge: 2017-02-28 | Disposition: A | Discharge status: Home / Self Care | Payer: Medicaid Other | Attending: Emergency Medicine | Admitting: Emergency Medicine

## 2017-02-28 ENCOUNTER — Encounter (HOSPITAL_COMMUNITY): Payer: Self-pay | Admitting: Emergency Medicine

## 2017-02-28 DIAGNOSIS — T07XXXA Unspecified multiple injuries, initial encounter: Secondary | ICD-10-CM

## 2017-02-28 DIAGNOSIS — Z87891 Personal history of nicotine dependence: Secondary | ICD-10-CM | POA: Diagnosis not present

## 2017-02-28 DIAGNOSIS — M545 Low back pain: Secondary | ICD-10-CM | POA: Diagnosis not present

## 2017-02-28 DIAGNOSIS — Y999 Unspecified external cause status: Secondary | ICD-10-CM | POA: Diagnosis not present

## 2017-02-28 DIAGNOSIS — Y9389 Activity, other specified: Secondary | ICD-10-CM | POA: Diagnosis not present

## 2017-02-28 DIAGNOSIS — Z79899 Other long term (current) drug therapy: Secondary | ICD-10-CM | POA: Insufficient documentation

## 2017-02-28 DIAGNOSIS — S8991XA Unspecified injury of right lower leg, initial encounter: Secondary | ICD-10-CM | POA: Diagnosis present

## 2017-02-28 DIAGNOSIS — S80812A Abrasion, left lower leg, initial encounter: Secondary | ICD-10-CM | POA: Diagnosis not present

## 2017-02-28 DIAGNOSIS — X58XXXA Exposure to other specified factors, initial encounter: Secondary | ICD-10-CM | POA: Insufficient documentation

## 2017-02-28 DIAGNOSIS — S80811A Abrasion, right lower leg, initial encounter: Secondary | ICD-10-CM | POA: Diagnosis not present

## 2017-02-28 DIAGNOSIS — Y92009 Unspecified place in unspecified non-institutional (private) residence as the place of occurrence of the external cause: Secondary | ICD-10-CM | POA: Diagnosis not present

## 2017-02-28 MED ORDER — TRAMADOL HCL 50 MG PO TABS
50.0000 mg | ORAL_TABLET | Freq: Once | ORAL | Status: AC
  Administered 2017-02-28: 50 mg via ORAL
  Filled 2017-02-28: qty 1
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
## 2017-03-01 NOTE — Progress Notes (Signed)
REVIEWED-NO ADDITIONAL RECOMMENDATIONS. 

## 2017-03-02 ENCOUNTER — Encounter (HOSPITAL_COMMUNITY): Payer: Self-pay

## 2017-03-02 ENCOUNTER — Emergency Department (HOSPITAL_COMMUNITY)
Admission: EM | Admit: 2017-03-02 | Discharge: 2017-03-02 | Disposition: A | Discharge status: Home / Self Care | Payer: Medicaid Other | Attending: Emergency Medicine | Admitting: Emergency Medicine

## 2017-03-02 ENCOUNTER — Emergency Department (HOSPITAL_COMMUNITY): Payer: Medicaid Other

## 2017-03-02 DIAGNOSIS — R197 Diarrhea, unspecified: Secondary | ICD-10-CM | POA: Diagnosis not present

## 2017-03-02 DIAGNOSIS — R1110 Vomiting, unspecified: Secondary | ICD-10-CM

## 2017-03-02 DIAGNOSIS — R112 Nausea with vomiting, unspecified: Secondary | ICD-10-CM | POA: Insufficient documentation

## 2017-03-02 DIAGNOSIS — R110 Nausea: Secondary | ICD-10-CM

## 2017-03-02 DIAGNOSIS — R1032 Left lower quadrant pain: Secondary | ICD-10-CM | POA: Diagnosis not present

## 2017-03-02 DIAGNOSIS — Z87891 Personal history of nicotine dependence: Secondary | ICD-10-CM | POA: Diagnosis not present

## 2017-03-02 LAB — URINALYSIS, ROUTINE W REFLEX MICROSCOPIC
Glucose, UA: NEGATIVE mg/dL
Nitrite: NEGATIVE
Protein, ur: 30 mg/dL — AB
pH: 6 (ref 5.0–8.0)

## 2017-03-02 LAB — COMPREHENSIVE METABOLIC PANEL
ALBUMIN: 4.1 g/dL (ref 3.5–5.0)
ALK PHOS: 112 U/L (ref 38–126)
ALT: 22 U/L (ref 14–54)
AST: 41 U/L (ref 15–41)
Anion gap: 9 (ref 5–15)
BILIRUBIN TOTAL: 0.4 mg/dL (ref 0.3–1.2)
BUN: 9 mg/dL (ref 6–20)
CALCIUM: 9.4 mg/dL (ref 8.9–10.3)
CO2: 19 mmol/L — AB (ref 22–32)
CREATININE: 0.86 mg/dL (ref 0.44–1.00)
Chloride: 115 mmol/L — ABNORMAL HIGH (ref 101–111)
GFR calc Af Amer: >60 mL/min (ref >60)
GFR calc non Af Amer: >60 mL/min (ref >60)
GLUCOSE: 133 mg/dL — AB (ref 65–99)
Potassium: 3.5 mmol/L (ref 3.5–5.1)
SODIUM: 143 mmol/L (ref 135–145)
Total Protein: 7.7 g/dL (ref 6.5–8.1)

## 2017-03-02 LAB — LIPASE, BLOOD: Lipase: 24 U/L (ref 11–51)

## 2017-03-02 LAB — WET PREP, GENITAL
Clue Cells Wet Prep HPF POC: NONE SEEN
Sperm: NONE SEEN
TRICH WET PREP: NONE SEEN
YEAST WET PREP: NONE SEEN

## 2017-03-02 LAB — CBC
HCT: 39.9 % (ref 36.0–46.0)
Hemoglobin: 13.9 g/dL (ref 12.0–15.0)
MCH: 30.8 pg (ref 26.0–34.0)
MCHC: 34.8 g/dL (ref 30.0–36.0)
MCV: 88.3 fL (ref 78.0–100.0)
Platelets: 270 10*3/uL (ref 150–400)
RBC: 4.52 MIL/uL (ref 3.87–5.11)
RDW: 15.3 % (ref 11.5–15.5)
WBC: 10.9 10*3/uL — ABNORMAL HIGH (ref 4.0–10.5)

## 2017-03-02 LAB — URINALYSIS, MICROSCOPIC (REFLEX)

## 2017-03-02 LAB — POC URINE PREG, ED: Preg Test, Ur: NEGATIVE

## 2017-03-02 MED ORDER — DICYCLOMINE HCL 10 MG PO CAPS
10.0000 mg | ORAL_CAPSULE | Freq: Once | ORAL | Status: AC
  Administered 2017-03-02: 10 mg via ORAL
  Filled 2017-03-02: qty 1

## 2017-03-02 MED ORDER — MORPHINE SULFATE (PF) 4 MG/ML IV SOLN
4.0000 mg | Freq: Once | INTRAVENOUS | Status: AC
  Administered 2017-03-02: 4 mg via INTRAVENOUS
  Filled 2017-03-02: qty 1

## 2017-03-02 MED ORDER — ACIDOPHILUS PROBIOTIC 10 MG PO TABS: 10.0000 mg | ORAL_TABLET | Freq: Three times a day (TID) | ORAL | 0 refills | Status: DC

## 2017-03-02 MED ORDER — ONDANSETRON 4 MG PO TBDP: 4.0000 mg | ORAL_TABLET | Freq: Three times a day (TID) | ORAL | 0 refills | Status: DC | PRN

## 2017-03-02 MED ORDER — IOPAMIDOL (ISOVUE-300) INJECTION 61%
100.0000 mL | Freq: Once | INTRAVENOUS | Status: AC | PRN
  Administered 2017-03-02: 120 mL via INTRAVENOUS

## 2017-03-02 MED ORDER — HALOPERIDOL LACTATE 5 MG/ML IJ SOLN
2.0000 mg | Freq: Once | INTRAMUSCULAR | Status: AC
  Administered 2017-03-02: 2 mg via INTRAVENOUS
  Filled 2017-03-02: qty 1

## 2017-03-02 MED ORDER — METOCLOPRAMIDE HCL 5 MG/ML IJ SOLN
10.0000 mg | Freq: Once | INTRAMUSCULAR | Status: AC
  Administered 2017-03-02: 10 mg via INTRAVENOUS
  Filled 2017-03-02: qty 2

## 2017-03-02 MED ORDER — DIPHENHYDRAMINE HCL 50 MG/ML IJ SOLN
25.0000 mg | Freq: Once | INTRAMUSCULAR | Status: AC
  Administered 2017-03-02: 25 mg via INTRAVENOUS
  Filled 2017-03-02: qty 1

## 2017-03-02 MED ORDER — ONDANSETRON HCL 4 MG/2ML IJ SOLN
4.0000 mg | Freq: Once | INTRAMUSCULAR | Status: AC
  Administered 2017-03-02: 4 mg via INTRAVENOUS
  Filled 2017-03-02: qty 2

## 2017-03-02 MED ORDER — SODIUM CHLORIDE 0.9 % IV BOLUS (SEPSIS)
1000.0000 mL | Freq: Once | INTRAVENOUS | Status: AC
  Administered 2017-03-02: 1000 mL via INTRAVENOUS

## 2017-03-02 MED ORDER — DICYCLOMINE HCL 20 MG PO TABS: 20.0000 mg | ORAL_TABLET | Freq: Two times a day (BID) | ORAL | 0 refills | Status: DC

## 2017-03-02 NOTE — ED Triage Notes (Signed)
Pt report that she woke up with vomiting and diarrhea. Complaining of lower abdominal pain

## 2017-03-02 NOTE — ED Provider Notes (Signed)
[facility] DEPT Provider Note   CSN: [id number] Arrival date & time: 03/02/17  1545     History   Chief Complaint Chief Complaint  Patient presents with  . Emesis    HPI [name] is a 31 y.o. female.  [name] is a 31 y.o. Female who presents to the ED with her mother complaining of nausea, vomiting and diarrhea since waking up this morning. She also reports developing suprapubic abdominal pain starting today. She has taken Pepto-Bismol on Protonix without relief of her symptoms. She reports multiple episodes of vomiting and diarrhea today. She denies hematemesis or hematochezia. She denies any urinary symptoms. She denies previous abdominal surgeries. She denies fevers, hematemesis, hematochezia, urinary symptoms, hematuria, back pain, chest pain, shortness of breath or syncope.   The history is provided by the patient, a parent and medical records. No language interpreter was used.  Emesis   Associated symptoms include abdominal pain and diarrhea. Pertinent negatives include no chills, no cough, no fever and no headaches.    Past Medical History:  Diagnosis Date  . Abdominal pain, chronic, epigastric   . Abdominal pain, chronic, right lower quadrant   . Bulging disc   . Constipation   . Contraceptive management 07/18/2014  . Disk prolapse   . Elevated liver enzymes 12/01/2013  . Gastritis   . GERD (gastroesophageal reflux disease)   . Headache   . Herpes   . Mental disorder    anxiety  . Obesity   . Other and unspecified ovarian cyst 12/01/2013  . Ovarian cyst, right     Patient Active Problem List   Diagnosis Date Noted  . Constipation 01/01/2016  . Abdominal pain 01/01/2016  . Obesity 07/25/2015  . Vomiting and diarrhea 11/11/2014  . Acute gastroenteritis 11/11/2014  . UTI (lower urinary tract infection) 11/11/2014  . Hypokalemia 11/11/2014  . Morbid obesity (HCC) 11/11/2014  . Contraceptive management 07/18/2014  . Abdominal pain, acute  06/08/2014  . Nausea & vomiting 06/08/2014  . Dehydration 06/08/2014  . Intractable nausea and vomiting 06/08/2014  . Gastritis   . Abdominal pain, chronic, epigastric   . Abdominal pain, chronic, right lower quadrant musculoskeletal 02/06/2014  . Other and unspecified ovarian cyst 12/05/2013  . Other and unspecified ovarian cyst 12/01/2013  . Elevated liver enzymes 12/01/2013  . Internal hemorrhoids with other complication 11/23/2013  . Unspecified constipation 08/07/2013  . Abdominal pain, other specified site 02/03/2013  . Rectal bleeding 02/03/2013  . Lumbar pain 07/14/2012  . Lumbar radiculopathy 07/14/2012  . Nausea and vomiting 01/11/2012  . Esophageal reflux 01/11/2012  . KNEE PAIN 06/13/2009  . CONTUSION, LEFT KNEE 06/13/2009    Past Surgical History:  Procedure Laterality Date  . COLONOSCOPY WITH ESOPHAGOGASTRODUODENOSCOPY (EGD) N/A 02/24/2013   [id number]:[id number] RECTAL BLEEDING DUE TO Moderate sized internal hemorrhoids, EGD: erosive esophagitis due to uncontrolled reflux, moderate erosive gastritis, benign path  . ESOPHAGOGASTRODUODENOSCOPY  01/19/2012   OZH:[id number],IONGEXBM IN THE ANTRUM/HIATAL HERNIA/  . ESOPHAGOGASTRODUODENOSCOPY N/A 07/31/2016   Procedure: ESOPHAGOGASTRODUODENOSCOPY (EGD);  Surgeon: West Bali, MD;  Location: [facility];  Service: Endoscopy;  Laterality: N/A;  10:15 AM  . WISDOM TOOTH EXTRACTION      OB History    Gravida Para Term Preterm AB Living   SAB TAB Ectopic Multiple Live Births           1       Home Medications    Prior  to Admission medications   Medication Sig Start Date End Date Taking? Authorizing Provider  dicyclomine (BENTYL) 20 MG tablet Take 1 tablet (20 mg total) by mouth 2 (two) times daily. 03/02/17   Everlene Farrier, PA-C  DULoxetine (CYMBALTA) 60 MG capsule Take 60 mg by mouth at bedtime.    Historical Provider, MD  FIBER, CORN DEXTRIN, PO Take 1 tablet by mouth daily.    Historical Provider, MD    Lactobacillus (ACIDOPHILUS PROBIOTIC) 10 MG TABS Take 10 mg by mouth 3 (three) times daily. 03/02/17   Everlene Farrier, PA-C  linaclotide (LINZESS) 145 MCG CAPS capsule Take 145 mcg by mouth daily before breakfast.    Historical Provider, MD  LYRICA 150 MG capsule Take 150 mg by mouth 2 (two) times daily. 07/29/15   Historical Provider, MD  medroxyPROGESTERone (DEPO-PROVERA) 150 MG/ML injection Inject 1 mL into the muscle every 3 (three) months. 01/12/17   Historical Provider, MD  Melatonin 5 MG TABS Take 1 tablet by mouth at bedtime.    Historical Provider, MD  ondansetron (ZOFRAN ODT) 4 MG disintegrating tablet Take 1 tablet (4 mg total) by mouth every 8 (eight) hours as needed for nausea or vomiting. 03/02/17   Everlene Farrier, PA-C  pantoprazole (PROTONIX) 40 MG tablet Take 1 tablet (40 mg total) by mouth 2 (two) times daily before a meal. 01/13/17   Anice Paganini, NP  pravastatin (PRAVACHOL) 40 MG tablet Take 40 mg by mouth at bedtime. 01/20/17   Historical Provider, MD  tiZANidine (ZANAFLEX) 2 MG tablet Take 2 mg by mouth every 8 (eight) hours as needed for muscle spasms.  05/21/16   Historical Provider, MD  topiramate (TOPAMAX) 100 MG tablet Take 300 mg by mouth daily. 07/29/15   Historical Provider, MD  traMADol (ULTRAM) 50 MG tablet Take 1 tablet (50 mg total) by mouth every 6 (six) hours as needed. Patient taking differently: Take 50 mg by mouth every 6 (six) hours as needed for moderate pain.  05/05/16   Ivery Quale, PA-C  valACYclovir (VALTREX) 1000 MG tablet TAKE 1 TABLET BY MOUTH EVERY DAY 07/21/16   Adline Potter, NP    Family History Family History  Problem Relation Age of Onset  . GER disease Mother   . Other Mother     diverticulitis; hernia  . Diabetes Mother     borderline  . Diabetes Maternal Grandmother   . COPD Maternal Grandmother   . Diabetes Maternal Grandfather   . Congestive Heart Failure Maternal Grandfather   . Headache Son   . Diabetes Other   . Colon cancer Neg Hx    . Colon polyps Neg Hx     Social History Social History  Substance Use Topics  . Smoking status: Former Smoker    Packs/day: 0.00    Years: 0.50    Types: Cigarettes    Quit date: 07/28/2011  . Smokeless tobacco: Never Used     Comment: stopped in 2009  . Alcohol use No     Allergies   Naproxen   Review of Systems Review of Systems  Constitutional: Negative for chills and fever.  HENT: Negative for congestion and sore throat.   Eyes: Negative for visual disturbance.  Respiratory: Negative for cough and shortness of breath.   Cardiovascular: Negative for chest pain.  Gastrointestinal: Positive for abdominal pain, diarrhea, nausea and vomiting. Negative for abdominal distention and blood in stool.  Genitourinary: Negative for difficulty urinating, dysuria, flank pain, frequency, hematuria, urgency, vaginal bleeding and  vaginal discharge.  Musculoskeletal: Negative for back pain and neck pain.  Skin: Negative for rash.  Neurological: Negative for syncope and headaches.     Physical Exam Updated Vital Signs BP 128/73 (BP Location: Right Arm)   Pulse 77   Temp 97.5 F (36.4 C) (Temporal)   Resp 16   Ht  (1.676 m)   Wt (!) 137.4 kg   LMP 02/10/2017   SpO2 100%   BMI 48.91 kg/m   Physical Exam  Constitutional: She is oriented to person, place, and time. She appears well-developed and well-nourished. No distress.  Nontoxic appearing. Obese female.  HENT:  Head: Normocephalic and atraumatic.  Mouth/Throat: Oropharynx is clear and moist.  Mucous membranes are moist.  Eyes: Conjunctivae are normal. Pupils are equal, round, and reactive to light. Right eye exhibits no discharge. Left eye exhibits no discharge.  Neck: Neck supple.  Cardiovascular: Normal rate, regular rhythm, normal heart sounds and intact distal pulses.  Exam reveals no gallop and no friction rub.   No murmur heard. Pulmonary/Chest: Effort normal and breath sounds normal. No respiratory  distress. She has no wheezes. She has no rales.  Abdominal: Soft. Bowel sounds are normal. She exhibits no distension and no mass. There is tenderness. There is no rebound and no guarding.  Abdomen is soft. Bowel sounds are present. Patient has mild suprapubic abdominal tenderness to palpation. No peritoneal signs. No CVA or flank tenderness. No psoas or obturator sign.   Genitourinary: Vagina normal. No vaginal discharge found.  Genitourinary Comments: Pelvic exam performed by me with female RN chaperone. No external lesions or rashes noted. No vaginal discharge noted. No cervical motion tenderness. No adnexal tenderness or fullness.  Musculoskeletal: She exhibits no edema.  Lymphadenopathy:    She has no cervical adenopathy.  Neurological: She is alert and oriented to person, place, and time. Coordination normal.  Skin: Skin is warm and dry. Capillary refill takes less than 2 seconds. No rash noted. She is not diaphoretic. No erythema. No pallor.  Psychiatric: She has a normal mood and affect. Her behavior is normal.  Nursing note and vitals reviewed.    ED Treatments / Results  Labs (all labs ordered are listed, but only abnormal results are displayed) Labs Reviewed  WET PREP, GENITAL - Abnormal; Notable for the following:       Result Value   WBC, Wet Prep HPF POC FEW (*)    All other components within normal limits  COMPREHENSIVE METABOLIC PANEL - Abnormal; Notable for the following:    Chloride 115 (*)    CO2 19 (*)    Glucose, Bld 133 (*)    All other components within normal limits  CBC - Abnormal; Notable for the following:    WBC 10.9 (*)    All other components within normal limits  URINALYSIS, ROUTINE W REFLEX MICROSCOPIC - Abnormal; Notable for the following:    APPearance CLOUDY (*)    Specific Gravity, Urine >1.030 (*)    Hgb urine dipstick SMALL (*)    Bilirubin Urine SMALL (*)    Ketones, ur >80 (*)    Protein, ur 30 (*)    Leukocytes, UA TRACE (*)    All other  components within normal limits  URINALYSIS, MICROSCOPIC (REFLEX) - Abnormal; Notable for the following:    Bacteria, UA FEW (*)    Squamous Epithelial / LPF 6-30 (*)    All other components within normal limits  URINE CULTURE  LIPASE, BLOOD  POC URINE  PREG, ED  GC/CHLAMYDIA PROBE AMP ([place]) NOT AT [facility]    EKG  EKG Interpretation None       Radiology Ct Abdomen Pelvis W Contrast  Result Date: 03/02/2017 CLINICAL DATA:  Acute onset of left lower quadrant and suprapubic abdominal pain. Nausea, vomiting and diarrhea. Initial encounter. EXAM: CT ABDOMEN AND PELVIS WITH CONTRAST TECHNIQUE: Multidetector CT imaging of the abdomen and pelvis was performed using the standard protocol following bolus administration of intravenous contrast. CONTRAST:  ISOVUE-300 IOPAMIDOL (ISOVUE-300) INJECTION 61% COMPARISON:  CT of the abdomen and pelvis performed 06/11/2016 FINDINGS: Lower chest: The visualized lung bases are grossly clear. The visualized portions of the mediastinum are unremarkable. Hepatobiliary: The liver is unremarkable in appearance. The gallbladder is unremarkable in appearance. The common bile duct remains normal in caliber. Pancreas: The pancreas is within normal limits. Spleen: The spleen is enlarged, measuring 14.8 cm in length. Adrenals/Urinary Tract: The adrenal glands are unremarkable in appearance. The kidneys are within normal limits. There is no evidence of hydronephrosis. No renal or ureteral stones are identified. No perinephric stranding is seen. Stomach/Bowel: The stomach is unremarkable in appearance. The proximal small bowel is within normal limits. The appendix is normal in caliber, without evidence of appendicitis. There is mild fatty infiltration of the wall of the colon and distal ileum, raising concern for chronic sequelae of inflammation. The colon is otherwise unremarkable in appearance. Vascular/Lymphatic: The abdominal aorta is unremarkable in appearance. The  inferior vena cava is grossly unremarkable. No retroperitoneal lymphadenopathy is seen. No pelvic sidewall lymphadenopathy is identified. Reproductive: The bladder is mildly distended and within normal limits. The uterus is grossly unremarkable in appearance. The ovaries are relatively symmetric. No suspicious adnexal masses are seen. Other: No additional soft tissue abnormalities are seen. Musculoskeletal: No acute osseous abnormalities are identified. The visualized musculature is unremarkable in appearance. IMPRESSION: 1. No acute abnormality seen to explain the patient's symptoms. 2. Mild diffuse fatty infiltration of the wall of the colon and distal ileum, raising concern for chronic sequelae of inflammation. 3. Splenomegaly. Electronically Signed   By: Roanna Raider M.D.   On: 03/02/2017 19:32    Procedures Procedures (including critical care time)  Medications Ordered in ED Medications  sodium chloride 0.9 % bolus 1,000 mL (0 mLs Intravenous Stopped 03/02/17 1800)  ondansetron (ZOFRAN) injection 4 mg (4 mg Intravenous Given 03/02/17 1713)  dicyclomine (BENTYL) capsule 10 mg (10 mg Oral Given 03/02/17 1713)  metoCLOPramide (REGLAN) injection 10 mg (10 mg Intravenous Given 03/02/17 1725)  diphenhydrAMINE (BENADRYL) injection 25 mg (25 mg Intravenous Given 03/02/17 1725)  morphine 4 MG/ML injection 4 mg (4 mg Intravenous Given 03/02/17 1725)  haloperidol lactate (HALDOL) injection 2 mg (2 mg Intravenous Given 03/02/17 1837)  iopamidol (ISOVUE-300) 61 % injection 100 mL (120 mLs Intravenous Contrast Given 03/02/17 1848)     Initial Impression / Assessment and Plan / ED Course  I have reviewed the triage vital signs and the nursing notes.  Pertinent labs & imaging results that were available during my care of the patient were reviewed by me and considered in my medical decision making (see chart for details).    This  is a 31 y.o. Female who presents to the ED with her mother complaining of  nausea, vomiting and diarrhea since waking up this morning. She also reports developing suprapubic abdominal pain starting today. She has taken Pepto-Bismol on Protonix without relief of her symptoms. She reports multiple episodes of vomiting and diarrhea today. She  denies hematemesis or hematochezia. She denies any urinary symptoms. She denies previous abdominal surgeries. On exam the patient is afebrile and nontoxic appearing. Her abdomen is soft and she has suprapubic abdominal tenderness to palpation. No peritoneal signs. No CVA or flank tenderness. Patient received fluid bolus, Zofran and Bentyl. She then developed further vomiting and complained of worsening pain. Pregnancy test is negative. Lipase is within normal limits. CMP shows preserved kidney function and normal liver enzymes. CBC is remarkable only for a white count of 10,900. Urinalysis is nitrite negative with trace leukocytes. She denies urinary symptoms. On repeat exam patient has suprapubic and left lower quadrant tenderness to palpation. We'll provide with Reglan, Benadryl and morphine. Will perform pelvic exam and obtain CT of her abdomen and pelvis due to her continued pain.  On pelvic exam patient has no vaginal discharge and no adnexal tenderness or fullness. I have low suspicion for ovarian torsion due to patient's history is well his exam. CT abdomen and pelvis is unremarkable. Wet prep is remarkable only for full use of white blood cells. She denies any vaginal discharge and has no pain on pelvic exam. She reports she was last sexually active more than 4 months ago. I have low suspicion for STD at this time. Gonorrhea and chlamydia testing are pending. Patient now feeling much better. She is tolerating by mouth without nausea or vomiting. She reports her abdominal pain has almost completely resolved. She tells me she is ready for discharge and ready to go home. We'll discharge with Zofran, probiotic and Bentyl. Discussed strict  and specific return precautions. I advised the patient to follow-up with their primary care provider this week. I advised the patient to return to the emergency department with new or worsening symptoms or new concerns. The patient verbalized understanding and agreement with plan.      Final Clinical Impressions(s) / ED Diagnoses   Final diagnoses:  Vomiting and diarrhea  Nausea  Left lower quadrant pain    New Prescriptions New Prescriptions   DICYCLOMINE (BENTYL) 20 MG TABLET    Take 1 tablet (20 mg total) by mouth 2 (two) times daily.   LACTOBACILLUS (ACIDOPHILUS PROBIOTIC) 10 MG TABS    Take 10 mg by mouth 3 (three) times daily.   ONDANSETRON (ZOFRAN ODT) 4 MG DISINTEGRATING TABLET    Take 1 tablet (4 mg total) by mouth every 8 (eight) hours as needed for nausea or vomiting.     Everlene Farrier, PA-C 03/02/17 2115    Eber Hong, MD 03/04/17 [phone]

## 2017-03-03 ENCOUNTER — Emergency Department (HOSPITAL_COMMUNITY)
Admission: EM | Admit: 2017-03-03 | Discharge: 2017-03-03 | Disposition: A | Discharge status: Home / Self Care | Payer: Medicaid Other | Attending: Dermatology | Admitting: Dermatology

## 2017-03-03 ENCOUNTER — Encounter (HOSPITAL_COMMUNITY): Payer: Self-pay | Admitting: *Deleted

## 2017-03-03 DIAGNOSIS — R109 Unspecified abdominal pain: Secondary | ICD-10-CM | POA: Diagnosis present

## 2017-03-03 DIAGNOSIS — Z87891 Personal history of nicotine dependence: Secondary | ICD-10-CM | POA: Diagnosis not present

## 2017-03-03 DIAGNOSIS — Z5321 Procedure and treatment not carried out due to patient leaving prior to being seen by health care provider: Secondary | ICD-10-CM | POA: Insufficient documentation

## 2017-03-03 NOTE — ED Triage Notes (Signed)
Pt seen for same yesterday, continued with abd cramping and emesis,  States today with blood in emesis.  Pt states that she filled prescriptions and have been taking as directed.

## 2017-03-04 LAB — URINE CULTURE: Culture: <10000 — AB

## 2017-03-04 LAB — GC/CHLAMYDIA PROBE AMP (~~LOC~~) NOT AT ARMC
CHLAMYDIA, DNA PROBE: NEGATIVE
NEISSERIA GONORRHEA: NEGATIVE

## 2017-04-09 ENCOUNTER — Ambulatory Visit: Payer: Medicaid Other

## 2017-07-13 ENCOUNTER — Ambulatory Visit (INDEPENDENT_AMBULATORY_CARE_PROVIDER_SITE_OTHER): Payer: Medicaid Other | Admitting: Nurse Practitioner

## 2017-07-13 ENCOUNTER — Encounter: Payer: Self-pay | Admitting: Nurse Practitioner

## 2017-07-13 VITALS — BP 125/79 | HR 99 | Temp 97.5°F | Ht 66.0 in | Wt 352.2 lb

## 2017-07-13 DIAGNOSIS — K5900 Constipation, unspecified: Secondary | ICD-10-CM | POA: Diagnosis not present

## 2017-07-13 DIAGNOSIS — K219 Gastro-esophageal reflux disease without esophagitis: Secondary | ICD-10-CM | POA: Diagnosis not present

## 2017-07-13 NOTE — Patient Instructions (Signed)
1. Continue your current medications. 2. Return for follow-up in 6 months. 3. Keep your appointment with OB/GYN to further evaluate whether or not your pregnant. 4. If you are pregnant, OB/GYN had the prerogative to change your medications as needed for safety considerations. 5. Call with any questions or concerns.

## 2017-07-13 NOTE — Progress Notes (Signed)
Referring Provider: Pearson Grippe, MD Primary Care Physician:  Pearson Grippe, MD Primary GI:  Dr. Darrick Penna  Chief Complaint  Patient presents with  . Gastroesophageal Reflux    doing ok, thinks she may be pregnant    HPI:   [name] is a 31 y.o. female who presents for follow-up on GERD. The patient was last seen in our office 01/13/2017 for chronic gastritis, obesity, GERD. 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 Recommended Protonix twice daily, return for follow-up in 4 months. Biopsies suggest uncontrolled GERD. Recommended follow left stomach of occasions, take medications, high-fiber/low-fat diet, nutrition referral, elevate head of bed on 6 inch blocks, avoid lying down after meals, no bedtime snacks, avoid triggers. Referral to [facility] to discuss possible weight reduction surgery. At the time of her last visit she was doing well on Protonix, appointment scheduled with bariatric surgeon in [place] next couple weeks. No other GI symptoms. Recommended continue Protonix, keep appointment with weight loss reduction surgery team, follow-up in 6 months.  Review of records indicate 3 total missed evaluation appointments with her bariatric weight loss evaluation at [facility]. She was placed on a 66-month-old due to her repeated no-shows.  Today she states she's doing well overall. Her GERD is doing well on her PPI. Denies any heartburn breakthrough symptoms. She thinks she may be pregnant because she's been having back pain, stomach pain, and a lot of weight gain. She has not had a period since coming off Depo in march 2018. She has an appointment on 07/21/2017 with OB/GYN or sooner per cancellation list. Denies N/V, hematochezia, melena, unintentional weight loss, fever, chills.  Past Medical History:  Diagnosis Date  . Abdominal pain, chronic, epigastric    . Abdominal pain, chronic, right lower quadrant   . Bulging disc   . Constipation   . Contraceptive management 07/18/2014  . Disk prolapse   . Elevated liver enzymes 12/01/2013  . Gastritis   . GERD (gastroesophageal reflux disease)   . Headache   . Herpes   . Mental disorder    anxiety  . Obesity   . Other and unspecified ovarian cyst 12/01/2013  . Ovarian cyst, right     Past Surgical History:  Procedure Laterality Date  . COLONOSCOPY WITH ESOPHAGOGASTRODUODENOSCOPY (EGD) N/A 02/24/2013   UEK:[id number] RECTAL BLEEDING DUE TO Moderate sized internal hemorrhoids, EGD: erosive esophagitis due to uncontrolled reflux, moderate erosive gastritis, benign path  . ESOPHAGOGASTRODUODENOSCOPY  01/19/2012   VWP:[id number],KPVVZSMO IN THE ANTRUM/HIATAL HERNIA/  . ESOPHAGOGASTRODUODENOSCOPY N/A 07/31/2016   Procedure: ESOPHAGOGASTRODUODENOSCOPY (EGD);  Surgeon: West Bali, MD;  Location[facility];  Service: Endoscopy;  Laterality: N/A;  10:15 AM  . WISDOM TOOTH EXTRACTION      Current Outpatient Prescriptions  Medication Sig Dispense Refill  . DULoxetine (CYMBALTA) 60 MG capsule Take 60 mg by mouth at bedtime.    Marland Kitchen FIBER, CORN DEXTRIN, PO Take 1 tablet by mouth daily.    . Lactobacillus (ACIDOPHILUS PROBIOTIC) 10 MG TABS Take 10 mg by mouth 3 (three) times daily. 30 tablet 0  . linaclotide (LINZESS) 145 MCG CAPS capsule Take 145 mcg by mouth daily before breakfast.    . LYRICA 150 MG capsule Take 150 mg by mouth 2 (two) times daily.  2  . Melatonin 5 MG TABS Take 1 tablet by mouth at bedtime.    . pantoprazole (PROTONIX)  40 MG tablet Take 1 tablet (40 mg total) by mouth 2 (two) times daily before a meal. 60 tablet 11  . pravastatin (PRAVACHOL) 40 MG tablet Take 40 mg by mouth at bedtime.  0  . tiZANidine (ZANAFLEX) 2 MG tablet Take 2 mg by mouth every 8 (eight) hours as needed for muscle spasms.   2  . topiramate (TOPAMAX) 100 MG tablet Take 300 mg by mouth daily.  2  . traMADol  (ULTRAM) 50 MG tablet Take 1 tablet (50 mg total) by mouth every 6 (six) hours as needed. (Patient taking differently: Take 50 mg by mouth every 6 (six) hours as needed for moderate pain. ) 15 tablet 0  . valACYclovir (VALTREX) 1000 MG tablet TAKE 1 TABLET BY MOUTH EVERY DAY 30 tablet 11  . medroxyPROGESTERone (DEPO-PROVERA) 150 MG/ML injection Inject 1 mL into the muscle every 3 (three) months.  4  . ondansetron (ZOFRAN ODT) 4 MG disintegrating tablet Take 1 tablet (4 mg total) by mouth every 8 (eight) hours as needed for nausea or vomiting. (Patient not taking: Reported on 07/13/2017) 10 tablet 0   No current facility-administered medications for this visit.     Allergies as of 07/13/2017 - Review Complete 07/13/2017  Allergen Reaction Noted  . Naproxen Other (See Comments) 05/05/2016    Family History  Problem Relation Age of Onset  . GER disease Mother   . Other Mother        diverticulitis; hernia  . Diabetes Mother        borderline  . Diabetes Maternal Grandmother   . COPD Maternal Grandmother   . Diabetes Maternal Grandfather   . Congestive Heart Failure Maternal Grandfather   . Headache Son   . Diabetes Other   . Colon cancer Neg Hx   . Colon polyps Neg Hx     Social History   Social History  . Marital status: Single    Spouse name: N/A  . Number of children: 1  . 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 Smoking status: Former Smoker    Packs/day: 0.00    Years: 0.50    Types: Cigarettes    Quit date: 07/28/2011  . Smokeless tobacco: Never Used     Comment: stopped in 2009  . Alcohol use No  . Drug use: No  . Sexual activity: Not Currently    Birth control/ protection: Pill   Other Topics Concern  . None   Social History Narrative  . None    Review of Systems: Complete ROS negative except as per HPI.   Physical Exam: BP 125/79   Pulse 99   Temp (!) 97.5 F (36.4 C) (Oral)    Ht 5\' 6"  (1.676 m)   Wt (!) 352 lb 3.2 oz (159.8 kg)   BMI 56.85 kg/m  General:   Morbidly obese female. Alert and oriented. Pleasant and cooperative. Well-nourished and well-developed.  Eyes:  Without icterus, sclera clear and conjunctiva pink.  Ears:  Normal auditory acuity. Cardiovascular:  S1, S2 present without murmurs appreciated. Extremities without clubbing or edema. Respiratory:  Clear to auscultation bilaterally. No wheezes, rales, or rhonchi. No distress.  Gastrointestinal:  +BS, soft, non-tender and non-distended. No HSM noted. No guarding or rebound. No masses appreciated.  Rectal:  Deferred  Musculoskalatal:  Symmetrical without gross deformities. Neurologic:  Alert and oriented x4;  grossly normal neurologically. Psych:  Alert and cooperative. Normal mood and affect. Heme/Lymph/Immune: No excessive bruising noted.    07/13/2017 10:33 AM   Disclaimer: This note was dictated with voice recognition software. Similar sounding words can inadvertently be transcribed and may not be corrected upon review.

## 2017-07-13 NOTE — Progress Notes (Signed)
cc'ed to pcp °

## 2017-07-13 NOTE — Assessment & Plan Note (Signed)
GERD symptoms well controlled on Protonix 40 mg twice a day. No breakthrough symptoms. As no side, the patient feels she may be pregnant. She has an appointment coming up with OB/GYN. Initial research indicates PPIs are acceptable therapy with no demonstrated risk to the fetus. At this time I will not stop her PPI, but will defer prerogative to women's health to alter her medications as necessary for the most safety to the fetus, if the patient is indeed pregnant. Return for follow-up in 6 months.

## 2017-07-13 NOTE — Assessment & Plan Note (Signed)
Constipation is well controlled on Linzess 145 g daily. As an aside, the patient feels she may be pregnant. I researched the pregnancy considerations with Linzess which indicates that plasma concentration is unlikely to result in fetal exposure. At this time, given no known fetal effects I will have her continue Linzess. Of course, women's health will have the prerogative to change her medications as needed for fetal safety, if the patient is indeed pregnant. I discussed this with her and she understands. Return for follow-up in 6 months.

## 2017-07-15 ENCOUNTER — Other Ambulatory Visit: Payer: Self-pay | Admitting: Adult Health

## 2017-07-21 ENCOUNTER — Ambulatory Visit (INDEPENDENT_AMBULATORY_CARE_PROVIDER_SITE_OTHER): Payer: Medicaid Other | Admitting: Adult Health

## 2017-07-21 ENCOUNTER — Encounter: Payer: Self-pay | Admitting: Adult Health

## 2017-07-21 VITALS — BP 138/80 | HR 107 | Ht 66.0 in | Wt 358.0 lb

## 2017-07-21 DIAGNOSIS — N912 Amenorrhea, unspecified: Secondary | ICD-10-CM

## 2017-07-21 DIAGNOSIS — M25472 Effusion, left ankle: Secondary | ICD-10-CM | POA: Diagnosis not present

## 2017-07-21 DIAGNOSIS — M25471 Effusion, right ankle: Secondary | ICD-10-CM | POA: Diagnosis not present

## 2017-07-21 DIAGNOSIS — R635 Abnormal weight gain: Secondary | ICD-10-CM

## 2017-07-21 DIAGNOSIS — R1013 Epigastric pain: Secondary | ICD-10-CM

## 2017-07-21 DIAGNOSIS — Z3202 Encounter for pregnancy test, result negative: Secondary | ICD-10-CM | POA: Diagnosis not present

## 2017-07-21 DIAGNOSIS — R1011 Right upper quadrant pain: Secondary | ICD-10-CM | POA: Diagnosis not present

## 2017-07-21 DIAGNOSIS — B369 Superficial mycosis, unspecified: Secondary | ICD-10-CM

## 2017-07-21 LAB — POCT URINE PREGNANCY: Preg Test, Ur: NEGATIVE

## 2017-07-21 MED ORDER — NYSTATIN-TRIAMCINOLONE 100000-0.1 UNIT/GM-% EX CREA: 1.0000 "application " | TOPICAL_CREAM | Freq: Two times a day (BID) | CUTANEOUS | 3 refills | Status: DC
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
## 2017-07-21 NOTE — Patient Instructions (Signed)

## 2017-07-22 LAB — CBC
HEMATOCRIT: 37.6 % (ref 34.0–46.6)
Hemoglobin: 12.2 g/dL (ref 11.1–15.9)
MCH: 28.2 pg (ref 26.6–33.0)
MCHC: 32.4 g/dL (ref 31.5–35.7)
MCV: 87 fL (ref 79–97)
PLATELETS: 226 10*3/uL (ref 150–379)
RBC: 4.32 x10E6/uL (ref 3.77–5.28)
RDW: 14.9 % (ref 12.3–15.4)
WBC: 10.4 10*3/uL (ref 3.4–10.8)

## 2017-07-22 LAB — COMPREHENSIVE METABOLIC PANEL
ALT: 15 IU/L (ref 0–32)
AST: 32 IU/L (ref 0–40)
Albumin/Globulin Ratio: 1.3 (ref 1.2–2.2)
Albumin: 3.8 g/dL (ref 3.5–5.5)
Alkaline Phosphatase: 129 IU/L — ABNORMAL HIGH (ref 39–117)
BUN / CREAT RATIO: 9 (ref 9–23)
BUN: 9 mg/dL (ref 6–20)
CO2: 18 mmol/L — ABNORMAL LOW (ref 20–29)
CREATININE: 0.96 mg/dL (ref 0.57–1.00)
Calcium: 8.8 mg/dL (ref 8.7–10.2)
Chloride: 108 mmol/L — ABNORMAL HIGH (ref 96–106)
GFR calc non Af Amer: 81 mL/min/{1.73_m2} (ref >59)
GFR, EST AFRICAN AMERICAN: 93 mL/min/{1.73_m2} (ref >59)
GLOBULIN, TOTAL: 2.9 g/dL (ref 1.5–4.5)
Glucose: 80 mg/dL (ref 65–99)
Potassium: 4.1 mmol/L (ref 3.5–5.2)
Sodium: 141 mmol/L (ref 134–144)
Total Protein: 6.7 g/dL (ref 6.0–8.5)

## 2017-07-22 LAB — TSH: TSH: 2.1 u[IU]/mL (ref 0.450–4.500)

## 2017-07-22 LAB — BETA HCG QUANT (REF LAB): hCG Quant: <1 m[IU]/mL

## 2017-07-23 ENCOUNTER — Encounter: Payer: Self-pay | Admitting: Adult Health

## 2017-07-23 ENCOUNTER — Ambulatory Visit (HOSPITAL_COMMUNITY)
Admission: RE | Admit: 2017-07-23 | Discharge: 2017-07-23 | Disposition: A | Discharge status: Home / Self Care | Payer: Medicaid Other | Source: Ambulatory Visit | Attending: Adult Health | Admitting: Adult Health

## 2017-07-23 DIAGNOSIS — R1013 Epigastric pain: Secondary | ICD-10-CM | POA: Insufficient documentation

## 2017-07-23 DIAGNOSIS — R161 Splenomegaly, not elsewhere classified: Secondary | ICD-10-CM | POA: Insufficient documentation

## 2017-07-23 DIAGNOSIS — R1011 Right upper quadrant pain: Secondary | ICD-10-CM | POA: Diagnosis present

## 2017-07-23 DIAGNOSIS — K760 Fatty (change of) liver, not elsewhere classified: Secondary | ICD-10-CM | POA: Insufficient documentation

## 2017-07-23 HISTORY — DX: Splenomegaly, not elsewhere classified: R16.1

## 2017-07-23 HISTORY — DX: Fatty (change of) liver, not elsewhere classified: K76.0

## 2017-07-26 ENCOUNTER — Telehealth: Payer: Self-pay | Admitting: Adult Health

## 2017-07-26 DIAGNOSIS — K760 Fatty (change of) liver, not elsewhere classified: Secondary | ICD-10-CM

## 2017-07-26 DIAGNOSIS — R1011 Right upper quadrant pain: Secondary | ICD-10-CM

## 2017-07-26 NOTE — Telephone Encounter (Signed)
Pt aware US showed fatty liver and stable splenomegaly, [facility], she has seen him before.

## 2017-07-27 ENCOUNTER — Encounter: Payer: Self-pay | Admitting: Nurse Practitioner

## 2017-07-28 ENCOUNTER — Encounter: Payer: Self-pay | Admitting: Adult Health

## 2017-07-28 ENCOUNTER — Ambulatory Visit (INDEPENDENT_AMBULATORY_CARE_PROVIDER_SITE_OTHER): Payer: Medicaid Other | Admitting: Adult Health

## 2017-07-28 VITALS — BP 100/60 | HR 83 | Ht 66.0 in | Wt 355.6 lb

## 2017-07-28 DIAGNOSIS — R1013 Epigastric pain: Secondary | ICD-10-CM | POA: Diagnosis not present

## 2017-07-28 DIAGNOSIS — R1011 Right upper quadrant pain: Secondary | ICD-10-CM | POA: Diagnosis not present

## 2017-07-28 DIAGNOSIS — R635 Abnormal weight gain: Secondary | ICD-10-CM

## 2017-07-28 DIAGNOSIS — N912 Amenorrhea, unspecified: Secondary | ICD-10-CM

## 2017-07-28 DIAGNOSIS — K760 Fatty (change of) liver, not elsewhere classified: Secondary | ICD-10-CM

## 2017-07-28 DIAGNOSIS — Z3202 Encounter for pregnancy test, result negative: Secondary | ICD-10-CM

## 2017-07-28 LAB — POCT URINE PREGNANCY: Preg Test, Ur: NEGATIVE

## 2017-07-28 MED ORDER — MEDROXYPROGESTERONE ACETATE 10 MG PO TABS: 10.0000 mg | ORAL_TABLET | Freq: Every day | ORAL | 0 refills | Status: DC

## 2017-07-28 NOTE — Patient Instructions (Signed)
Nonalcoholic Fatty Liver Disease Diet Nonalcoholic fatty liver disease is a condition that causes fat to accumulate in and around the liver. The disease makes it harder for the liver to work the way that it should. Following a healthy diet can help to keep nonalcoholic fatty liver disease under control. It can also help to prevent or improve conditions that are associated with the disease, such as heart disease, diabetes, high blood pressure, and abnormal cholesterol levels. Along with regular exercise, this diet:  Promotes weight loss.  Helps to control blood sugar levels.  Helps to improve the way that the body uses insulin.  What do I need to know about this diet?  Use the glycemic index (GI) to plan your meals. The index tells you how quickly a food will raise your blood sugar. Choose low-GI foods. These foods take a longer time to raise blood sugar.  Keep track of how many calories you take in. Eating the right amount of calories will help you to achieve a healthy weight.  You may want to follow a Mediterranean diet. This diet includes a lot of vegetables, lean meats or fish, whole grains, fruits, and healthy oils and fats. What foods can I eat? Grains Whole grains, such as whole-wheat or whole-grain breads, crackers, tortillas, cereals, and pasta. Stone-ground whole wheat. Pumpernickel bread. Unsweetened oatmeal. Bulgur. Barley. Quinoa. Brown or wild rice. Corn or whole-wheat flour tortillas. Vegetables Lettuce. Spinach. Peas. Beets. Cauliflower. Cabbage. Broccoli. Carrots. Tomatoes. Squash. Eggplant. Herbs. Peppers. Onions. Cucumbers. Brussels sprouts. Yams and sweet potatoes. Beans. Lentils. Fruits Bananas. Apples. Oranges. Grapes. Papaya. Mango. Pomegranate. Kiwi. Grapefruit. Cherries. Meats and Other Protein Sources Seafood and shellfish. Lean meats. Poultry. Tofu. Dairy Low-fat or fat-free dairy products, such as yogurt, cottage cheese, and cheese. Beverages Water. Sugar-free  drinks. Tea. Coffee. Low-fat or skim milk. Milk alternatives, such as soy or almond milk. Real fruit juice. Condiments Mustard. Relish. Low-fat, low-sugar ketchup and barbecue sauce. Low-fat or fat-free mayonnaise. Sweets and Desserts Sugar-free sweets. Fats and Oils Avocado. Canola or olive oil. Nuts and nut butters. Seeds. The items listed above may not be a complete list of recommended foods or beverages. Contact your dietitian for more options. What foods are not recommended? Palm oil and coconut oil. Processed foods. Fried foods. Sweetened drinks, such as sweet tea, milkshakes, snow cones, iced sweet drinks, and sodas. Alcohol. Sweets. Foods that contain a lot of salt or sodium. The items listed above may not be a complete list of foods and beverages to avoid. Contact your dietitian for more information. This information is not intended to replace advice given to you by your health care provider. Make sure you discuss any questions you have with your health care provider. Document Released: 03/19/2015 Document Revised: 04/09/2016 Document Reviewed: 11/27/2014 Elsevier Interactive Patient Education  2018 Elsevier Inc.  Fatty Liver Fatty liver, also called hepatic steatosis or steatohepatitis, is a condition in which too much fat has built up in your liver cells. The liver removes harmful substances from your bloodstream. It produces fluids your body needs. It also helps your body use and store energy from the food you eat. In many cases, fatty liver does not cause symptoms or problems. It is often diagnosed when tests are being done for other reasons. However, over time, fatty liver can cause inflammation that may lead to more serious liver problems, such as scarring of the liver (cirrhosis). What are the causes? Causes of fatty liver may include:  Drinking too much alcohol.  Poor   nutrition.  Obesity.  Cushing syndrome.  Diabetes.  Hyperlipidemia.  Pregnancy.  Certain  drugs.  Poisons.  Some viral infections.  What increases the risk? You may be more likely to develop fatty liver if you:  Abuse alcohol.  Are pregnant.  Are overweight.  Have diabetes.  Have hepatitis.  Have a high triglyceride level.  What are the signs or symptoms? Fatty liver often does not cause any symptoms. In cases where symptoms develop, they can include:  Fatigue.  Weakness.  Weight loss.  Confusion.  Abdominal pain.  Yellowing of your skin and the white parts of your eyes (jaundice).  Nausea and vomiting.  How is this diagnosed? Fatty liver may be diagnosed by:  Physical exam and medical history.  Blood tests.  Imaging tests, such as an ultrasound, CT scan, or MRI.  Liver biopsy. A small sample of liver tissue is removed using a needle. The sample is then looked at under a microscope.  How is this treated? Fatty liver is often caused by other health conditions. Treatment for fatty liver may involve medicines and lifestyle changes to manage conditions such as:  Alcoholism.  High cholesterol.  Diabetes.  Being overweight or obese.  Follow these instructions at home:  Eat a healthy diet as directed by your health care provider.  Exercise regularly. This can help you lose weight and control your cholesterol and diabetes. Talk to your health care provider about an exercise plan and which activities are best for you.  Do not drink alcohol.  Take medicines only as directed by your health care provider. Contact a health care provider if: You have difficulty controlling your:  Blood sugar.  Cholesterol.  Alcohol consumption.  Get help right away if:  You have abdominal pain.  You have jaundice.  You have nausea and vomiting. This information is not intended to replace advice given to you by your health care provider. Make sure you discuss any questions you have with your health care provider. Document Released: 12/18/2005 Document  Revised: 04/09/2016 Document Reviewed: 03/14/2014 Elsevier Interactive Patient Education  2018 Elsevier Inc.  

## 2017-07-28 NOTE — Progress Notes (Signed)
Subjective:     Patient ID: [name], female   DOB: 1986-02-14, 31 y.o.   MRN: [id number]  HPI [name] is a 31 year old white female, back in follow up of haing US for RUQ pain, and epigastric pain.Still no period.   Review of Systems +RUQ pain +epigastric pain +weight gain + no period since about March Reviewed past medical,surgical, social and family history. Reviewed medications and allergies.     Objective:   Physical Exam  Skin: Skin is warm and dry.  No swelling in feet and ankles today. BP 100/60 (BP Location: Left Arm, Patient Position: Sitting, Cuff Size: Large)   Pulse 83   Ht 5\' 6"  (1.676 m)   Wt (!) 355 lb 9.6 oz (161.3 kg)   BMI 57.40 kg/m UPT negative ROSReviewed US shows fatty liver and stable splenomegaly.Discussed weight loss will help.  Will give provera to get withdrawal bleed.  Assessment:     1. Amenorrhea   2. Fatty liver   3. RUQ pain   4. Epigastric pain   5. Weight gain       Plan:     Meds ordered this encounter  Medications  . medroxyPROGESTERone (PROVERA) 10 MG tablet    Sig: Take 1 tablet (10 mg total) by mouth daily.    Dispense:  10 tablet    Refill:  0    Order Specific Question:   Supervising Provider    Answer:   Despina HiddenEURE, LUTHER H [[id number]]  F/U in 3 weeks for pap and physical Referral was made to GI for Fatty liver, last week, if not heard from them by next week call them or me  Review handout on fatty liver and diet for fatty liver

## 2017-08-04 ENCOUNTER — Encounter: Payer: Self-pay | Admitting: Adult Health

## 2017-08-04 ENCOUNTER — Telehealth: Payer: Self-pay | Admitting: Adult Health

## 2017-08-04 MED ORDER — PROMETHAZINE HCL 25 MG PO TABS: 25.0000 mg | ORAL_TABLET | Freq: Four times a day (QID) | ORAL | 1 refills | Status: DC | PRN

## 2017-08-04 NOTE — Telephone Encounter (Signed)
Pt aware that phenergan sent to [facility]

## 2017-08-18 ENCOUNTER — Encounter: Payer: Self-pay | Admitting: Adult Health

## 2017-08-18 ENCOUNTER — Ambulatory Visit (INDEPENDENT_AMBULATORY_CARE_PROVIDER_SITE_OTHER): Payer: Medicaid Other | Admitting: Adult Health

## 2017-08-18 ENCOUNTER — Other Ambulatory Visit (HOSPITAL_COMMUNITY)
Admission: RE | Admit: 2017-08-18 | Discharge: 2017-08-18 | Disposition: A | Discharge status: Home / Self Care | Payer: Medicaid Other | Source: Ambulatory Visit | Attending: Adult Health | Admitting: Adult Health

## 2017-08-18 VITALS — BP 128/80 | HR 74 | Ht 66.0 in | Wt 364.6 lb

## 2017-08-18 DIAGNOSIS — K760 Fatty (change of) liver, not elsewhere classified: Secondary | ICD-10-CM | POA: Diagnosis not present

## 2017-08-18 DIAGNOSIS — N912 Amenorrhea, unspecified: Secondary | ICD-10-CM

## 2017-08-18 DIAGNOSIS — Z6841 Body Mass Index (BMI) 40.0 and over, adult: Secondary | ICD-10-CM

## 2017-08-18 DIAGNOSIS — Z01419 Encounter for gynecological examination (general) (routine) without abnormal findings: Secondary | ICD-10-CM

## 2017-08-18 DIAGNOSIS — Z124 Encounter for screening for malignant neoplasm of cervix: Secondary | ICD-10-CM

## 2017-08-18 NOTE — Patient Instructions (Addendum)
GYN [place] in 2 weeks Physical in 1 year Pap in 3 if normal See GI as scheduled

## 2017-08-18 NOTE — Addendum Note (Signed)
Addended by: Federico Flake A on: 08/18/2017 09:22 AM   Modules accepted: Orders

## 2017-08-18 NOTE — Progress Notes (Signed)
Patient ID: [name], female   DOB: Jan 12, 1986, 31 y.o.   MRN: [id number] History of Present Illness: Amauria is a 31 year old white female in for well woman gyn exam and pap. PCP is Dr Selena Batten.   Current Medications, Allergies, Past Medical History, Past Surgical History, Family History and Social History were reviewed in [employer] record.     Review of Systems: Patient denies any headaches, hearing loss, fatigue, blurred vision, shortness of breath, chest pain, problems with bowel movements, urination, or intercourse. No joint pain or mood swings.RUQ pain at times, did not have withdrawal bleed after provera     Physical Exam:BP 128/80 (BP Location: Left Arm, Patient Position: Sitting, Cuff Size: Large)   Pulse 74   Ht  (1.676 m)   Wt (!) 364 lb 9.6 oz (165.4 kg)   BMI 58.85 kg/m  General:  Well developed, well nourished, no acute distress Skin:  Warm and dry Neck:  Midline trachea, normal thyroid, good ROM, no lymphadenopathy Lungs; Clear to auscultation bilaterally Breast:  No dominant palpable mass, retraction, or nipple discharge Cardiovascular: Regular rate and rhythm Abdomen:  Soft, non tender, no hepatosplenomegaly,obese Pelvic:  External genitalia is normal in appearance, no lesions.  The vagina is normal in appearance. Urethra has no lesions or masses. The cervix is bulbous.Pap with GC/CHL and reflex HPV performed.  Uterus is felt to be normal size, shape, and contour.  No adnexal masses or tenderness noted.Bladder is non tender, no masses felt. Extremities/musculoskeletal:  No swelling or varicosities noted, no clubbing or cyanosis Psych:  No mood changes, alert and cooperative,seems happy PHQ 2 score 0.Will get pelvic US to assess endometrium. She wants to lose weight, discussed trying weight watchers, [place] [facility] [place], and even bariatric surgery, and seeing dietician, see GI first.   Impression: 1. Encounter for gynecological  examination with Papanicolaou smear of cervix   2. Amenorrhea   3. Fatty liver   4. Morbid obesity (HCC)       Plan: GYN [place] in 2 weeks Physical in 1 year Pap in 3 if normal See GI as scheduled

## 2017-08-20 LAB — CYTOLOGY - PAP
Chlamydia: NEGATIVE
Diagnosis: NEGATIVE
NEISSERIA GONORRHEA: NEGATIVE

## 2017-08-30 ENCOUNTER — Other Ambulatory Visit: Payer: Medicaid Other

## 2017-09-01 ENCOUNTER — Other Ambulatory Visit: Payer: Medicaid Other

## 2017-09-03 ENCOUNTER — Other Ambulatory Visit: Payer: Self-pay | Admitting: Adult Health

## 2017-09-03 DIAGNOSIS — N912 Amenorrhea, unspecified: Secondary | ICD-10-CM

## 2017-09-06 ENCOUNTER — Other Ambulatory Visit: Payer: Medicaid Other

## 2017-09-09 ENCOUNTER — Encounter: Payer: Self-pay | Admitting: Nurse Practitioner

## 2017-09-09 ENCOUNTER — Ambulatory Visit (INDEPENDENT_AMBULATORY_CARE_PROVIDER_SITE_OTHER): Payer: Medicaid Other | Admitting: Nurse Practitioner

## 2017-09-09 ENCOUNTER — Telehealth: Payer: Self-pay

## 2017-09-09 VITALS — BP 114/69 | HR 85 | Temp 96.7°F | Ht 66.0 in | Wt 355.6 lb

## 2017-09-09 DIAGNOSIS — R1084 Generalized abdominal pain: Secondary | ICD-10-CM | POA: Diagnosis not present

## 2017-09-09 DIAGNOSIS — R112 Nausea with vomiting, unspecified: Secondary | ICD-10-CM | POA: Diagnosis not present

## 2017-09-09 DIAGNOSIS — K219 Gastro-esophageal reflux disease without esophagitis: Secondary | ICD-10-CM

## 2017-09-09 DIAGNOSIS — K5900 Constipation, unspecified: Secondary | ICD-10-CM

## 2017-09-09 DIAGNOSIS — K760 Fatty (change of) liver, not elsewhere classified: Secondary | ICD-10-CM

## 2017-09-09 NOTE — Assessment & Plan Note (Signed)
Constipation currently well-controlled. Continue current medications and continue to monitor symptoms.

## 2017-09-09 NOTE — Progress Notes (Signed)
Referring Provider: Pearson GrippeKim, James, MD Primary Care Physician:  Pearson GrippeKim, James, MD Primary GI:  Dr. Darrick PennaFields  Chief Complaint  Patient presents with  . Abdominal Pain    HPI:   [name] is a 31 y.o. female who presents for follow-up on GERD, constipation, abdominal pain. At some point it was noted somewhere that she has fatty liver as well. The patient was last seen in our office 07/13/2017 for constipation and GERD. EGD last completed 2017 which found diffuse moderate inflammation in the gastric antrum status post biopsy which suggested uncontrolled GERD on surgical pathology reviewed. Recommended Protonix twice a day. General GERD instructions provided as well such as elevating the head of the bed, no late night eating, high-fiber/low-fat diet. At her last visit she was doing well overall, GERD well-controlled on PPI with no breakthrough noted. She was thinking she may be pregnant. Appointment scheduled for 07/21/2017 with OB/GYN no other GI symptoms.  OB/GYN appointment reviewed. Recommended CBC, CMP, TSH, HcG, abdominal ultrasound. Her hCG was found to be negative. CBC essentially normal. CMP with very mild elevation in alkaline phosphatase at 129. Otherwise normal. Abdominal ultrasound found splenomegaly, increased liver echogenicity indicative of hepatic steatosis without focal lesions, visualized portions of the pancreas normal, otherwise normal exam.  Today she states she's doing ok overall. GERD well controlled on PPI. No constipation at this time. Still with abdominal pain pan-upper abdomen. Pain is intermittent, 2-3 times a week, lasts about 2-3 hours. Cannot name alleviating or aggravating factors. Pain is stabbing. Denies NSAIDs and ASA powders. Some nausea, but Phenergan helps. Denies vomiting. Denies fever, chills. Has a bowel movement daily with noted complete emptying, consistent with [place] 4. Denies chest pain, dyspnea, dizziness, lightheadedness, syncope, near syncope. Denies  any other upper or lower GI symptoms.  Past Medical History:  Diagnosis Date  . Abdominal pain, chronic, epigastric   . Abdominal pain, chronic, right lower quadrant   . Bulging disc   . Constipation   . Contraceptive management 07/18/2014  . Disk prolapse   . Elevated liver enzymes 12/01/2013  . Fatty liver 07/23/2017  . Gastritis   . GERD (gastroesophageal reflux disease)   . Headache   . Herpes   . Mental disorder    anxiety  . Obesity   . Other and unspecified ovarian cyst 12/01/2013  . Ovarian cyst, right   . Splenomegaly 07/23/2017    Past Surgical History:  Procedure Laterality Date  . COLONOSCOPY WITH ESOPHAGOGASTRODUODENOSCOPY (EGD) N/A 02/24/2013   YNW:[id number]:INTERMITTENT RECTAL BLEEDING DUE TO Moderate sized internal hemorrhoids, EGD: erosive esophagitis due to uncontrolled reflux, moderate erosive gastritis, benign path  . ESOPHAGOGASTRODUODENOSCOPY  01/19/2012   QIO:NGEXBM,[id number]:ULCERS,MULTIPLE IN THE ANTRUM/HIATAL HERNIA/  . ESOPHAGOGASTRODUODENOSCOPY N/A 07/31/2016   Procedure: ESOPHAGOGASTRODUODENOSCOPY (EGD);  Surgeon: West BaliSandi L Fields, MD;  Location: [facility];  Service: Endoscopy;  Laterality: N/A;  10:15 AM  . WISDOM TOOTH EXTRACTION      Current Outpatient Prescriptions  Medication Sig Dispense Refill  . DULoxetine (CYMBALTA) 60 MG capsule Take 60 mg by mouth at bedtime.    Marland Kitchen. FIBER, CORN DEXTRIN, PO Take 1 tablet by mouth daily.    . Lactobacillus (ACIDOPHILUS PROBIOTIC) 10 MG TABS Take 10 mg by mouth 3 (three) times daily. 30 tablet 0  . linaclotide (LINZESS) 145 MCG CAPS capsule Take 145 mcg by mouth daily before breakfast.    . LYRICA 150 MG capsule Take 150 mg by mouth 2 (two) times daily.  2  . Melatonin  5 MG TABS Take 1 tablet by mouth at bedtime.    Marland Kitchen nystatin-triamcinolone (MYCOLOG II) cream Apply 1 application topically 2 (two) times daily. 30 g 3  . pantoprazole (PROTONIX) 40 MG tablet Take 1 tablet (40 mg total) by mouth 2 (two) times daily before a meal. 60 tablet 11    . pravastatin (PRAVACHOL) 40 MG tablet Take 40 mg by mouth at bedtime.  0  . promethazine (PHENERGAN) 25 MG tablet Take 1 tablet (25 mg total) by mouth every 6 (six) hours as needed for nausea or vomiting. 30 tablet 1  . tiZANidine (ZANAFLEX) 2 MG tablet Take 2 mg by mouth every 8 (eight) hours as needed for muscle spasms.   2  . topiramate (TOPAMAX) 100 MG tablet Take 300 mg by mouth daily.  2  . traMADol (ULTRAM) 50 MG tablet Take 1 tablet (50 mg total) by mouth every 6 (six) hours as needed. (Patient taking differently: Take 50 mg by mouth every 6 (six) hours as needed for moderate pain. ) 15 tablet 0  . valACYclovir (VALTREX) 1000 MG tablet TAKE 1 TABLET BY MOUTH EVERY DAY 30 tablet 11  . medroxyPROGESTERone (PROVERA) 10 MG tablet Take 1 tablet (10 mg total) by mouth daily. (Patient not taking: Reported on 08/18/2017) 10 tablet 0   No current facility-administered medications for this visit.     Allergies as of 09/09/2017 - Review Complete 09/09/2017  Allergen Reaction Noted  . Naproxen Other (See Comments) 05/05/2016    Family History  Problem Relation Age of Onset  . GER disease Mother   . Other Mother        diverticulitis; hernia  . Diabetes Mother        borderline  . Diabetes Maternal Grandmother   . COPD Maternal Grandmother   . Diabetes Maternal Grandfather   . Congestive Heart Failure Maternal Grandfather   . Headache Son   . Diabetes Other   . Colon cancer Neg Hx   . Colon polyps Neg Hx     Social History   Social History  . Marital status: Single    Spouse name: N/A  . Number of children: 1  . 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 Smoking status: Former Smoker    Packs/day: 0.00    Years: 0.50    Types: Cigarettes    Quit date: 07/28/2011  . Smokeless tobacco: Never Used     Comment: stopped in 2009  . Alcohol use No  . Drug use: No  . Sexual activity: Yes    Birth control/  protection: None   Other Topics Concern  . None   Social History Narrative  . None    Review of Systems: Complete ROS negative except as per HPI.   Physical Exam: BP 114/69   Pulse 85   Temp (!) 96.7 F (35.9 C) (Oral)   Ht 5\' 6"  (1.676 m)   Wt (!) 355 lb 9.6 oz (161.3 kg)   BMI 57.40 kg/m  General:   Morbidly obese female. Alert and oriented. Pleasant and cooperative. Well-nourished and well-developed.  Eyes:  Without icterus, sclera clear and conjunctiva pink.  Ears:  Normal auditory acuity. Cardiovascular:  S1, S2 present without murmurs appreciated. Extremities without clubbing or edema. Respiratory:  Clear to auscultation bilaterally. No wheezes, rales, or rhonchi. No distress.  Gastrointestinal:  +BS, morbidly obese but soft, and non-distended.  Mild TTP upper abdomen. No HSM noted. No guarding or rebound. No masses appreciated.  Rectal:  Deferred  Musculoskalatal:  Symmetrical without gross deformities. Neurologic:  Alert and oriented x4;  grossly normal neurologically. Psych:  Alert and cooperative. Normal mood and affect. Heme/Lymph/Immune: No excessive bruising noted.    09/09/2017 9:28 AM   Disclaimer: This note was dictated with voice recognition software. Similar sounding words can inadvertently be transcribed and may not be corrected upon review.

## 2017-09-09 NOTE — Assessment & Plan Note (Signed)
Noted upper abdominal pain including right upper quadrant in association with nausea. No vomiting. She's had an extensive evaluation thus far with no significant findings. Last test at this point would be a HIDA scan for biliary dysfunction. Her right upper quadrant ultrasound did not demonstrate gallstones or gallbladder wall thickening. We will pursue HIDA. Return for follow-up. Consider bariatric treatment occluding medical therapy and weight loss surgery as her body habitus is likely significantly contributing to her overall symptoms.

## 2017-09-09 NOTE — Assessment & Plan Note (Signed)
She feels her reflux symptoms are well controlled on PPI. Only associated symptom, essentially, his nausea. Given upper abdominal pain and nausea we'll proceed with HIDA scan for further evaluation of biliary etiology. Otherwise she has had an extensive workup including CT of the abdomen, ultrasound, EGD all with no significant findings.

## 2017-09-09 NOTE — Patient Instructions (Signed)
1. We will schedule your gallbladder function test for you. 2. Return for follow-up in 6 weeks. 3. Give some thought to reactivating the referral to the weight loss clinic and/or bariatric surgery center. 4. Call if she having questions or concerns.

## 2017-09-09 NOTE — Telephone Encounter (Signed)
HIDA scan scheduled for 09/14/17 at 8:00am, pt to arrive at 7:45am. NPO/no pain med after midnight. Called and informed pt.

## 2017-09-09 NOTE — Assessment & Plan Note (Signed)
The patient does have some nausea but Phenergan helps. Denies vomiting. This could be sequela of uncontrolled GERD. Her last EGD demonstrated uncontrolled GERD, although she does not have typical GERD symptoms ongoing and feels her heartburn is well controlled. Other evaluation with HIDA scan given her upper abdominal pain and associated nausea. The patient would likely benefit from weight loss therapy and/or weight loss surgery given her super morbidly obese body habitus and is likely effects on her reflux. I recommended she consider this as a future treatment option. However, she is on a 106 month old with the bariatric Center for missing 3 appointments.

## 2017-09-09 NOTE — Assessment & Plan Note (Signed)
Diagnosis of fatty liver disease on ultrasound of her liver. Her OB/GYN spent time educating her on fatty liver. I reinforce his education today. Would benefit from glycemic control, cholesterol control, healthy diet, exercise to reduce her body mass and help treat her fatty liver. Bariatric treatment also would likely benefit.

## 2017-09-10 NOTE — Progress Notes (Signed)
CC'ED TO PCP 

## 2017-09-14 ENCOUNTER — Encounter (HOSPITAL_COMMUNITY)
Admission: RE | Admit: 2017-09-14 | Discharge: 2017-09-14 | Disposition: A | Discharge status: Home / Self Care | Payer: Medicaid Other | Source: Ambulatory Visit | Attending: Nurse Practitioner | Admitting: Nurse Practitioner

## 2017-09-14 ENCOUNTER — Encounter (HOSPITAL_COMMUNITY): Payer: Self-pay

## 2017-09-14 DIAGNOSIS — K5900 Constipation, unspecified: Secondary | ICD-10-CM | POA: Diagnosis present

## 2017-09-14 DIAGNOSIS — K219 Gastro-esophageal reflux disease without esophagitis: Secondary | ICD-10-CM | POA: Insufficient documentation

## 2017-09-14 DIAGNOSIS — K760 Fatty (change of) liver, not elsewhere classified: Secondary | ICD-10-CM | POA: Diagnosis present

## 2017-09-14 DIAGNOSIS — R112 Nausea with vomiting, unspecified: Secondary | ICD-10-CM | POA: Diagnosis present

## 2017-09-14 DIAGNOSIS — R1084 Generalized abdominal pain: Secondary | ICD-10-CM | POA: Diagnosis present

## 2017-09-14 MED ORDER — TECHNETIUM TC 99M MEBROFENIN IV KIT
5.0000 | PACK | Freq: Once | INTRAVENOUS | Status: AC | PRN
  Administered 2017-09-14: 5.3 via INTRAVENOUS

## 2017-09-19 IMAGING — CT CT ABD-PELV W/ CM
2 of 4 series · 16 of 46 positions shown, 18 images · IV contrast (iopamidol)
Comparison: CT abdomen dated 03/27/2016

CLINICAL DATA: Lower pelvic pain for 2 weeks.

EXAM:
CT ABDOMEN AND PELVIS WITH CONTRAST
TECHNIQUE: Multidetector CT imaging of the abdomen and pelvis was performed
using the standard protocol following bolus administration of
intravenous contrast.
CONTRAST:  100mL [id number] IOPAMIDOL (SBBZ82-LQQ) INJECTION 61%

[Series 2: routine abd pel with · axial · 0.95mm/px · z∈[-684,-199]mm · 13 of 107 slices shown, 15 images]
[im 5/107  soft-tissue]
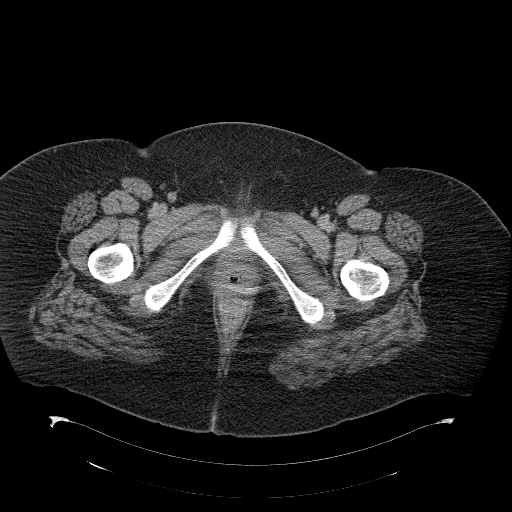
[im 5/107  bone]
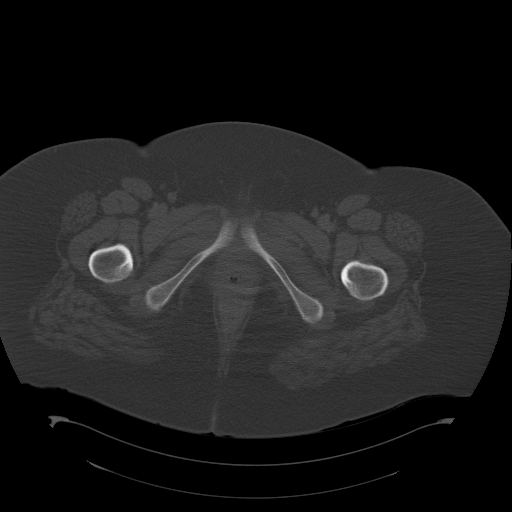
[im 14/107  soft-tissue]
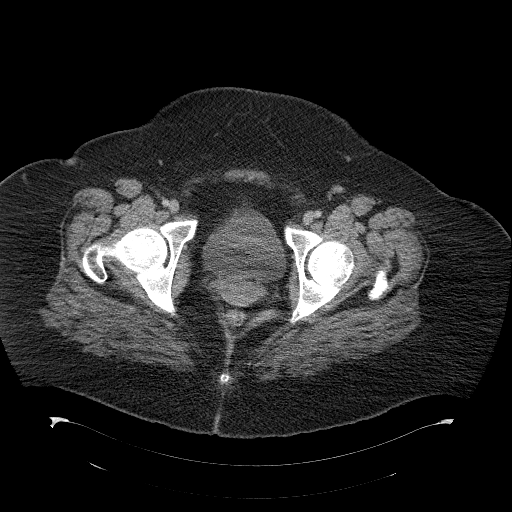
[im 24/107  soft-tissue]
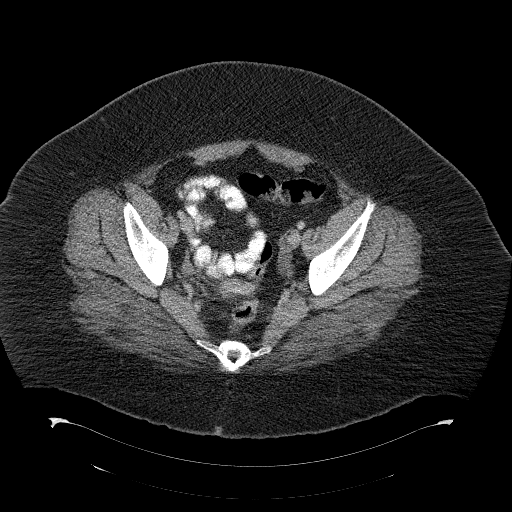
[im 28/107  soft-tissue]
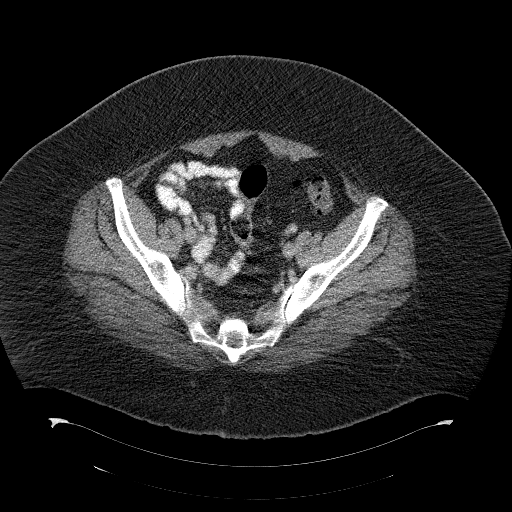
[im 37/107  soft-tissue]
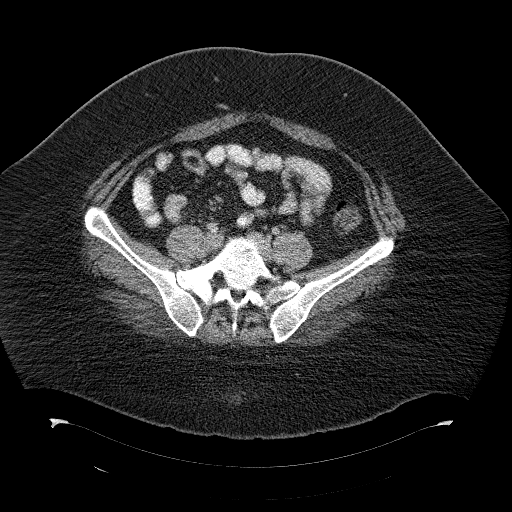
[im 47/107  soft-tissue]
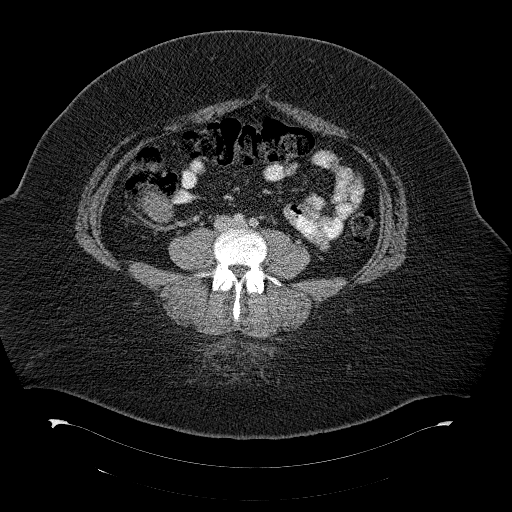
[im 56/107  soft-tissue]
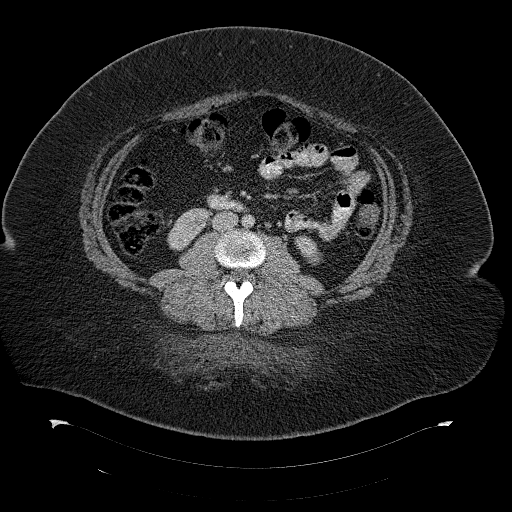
[im 60/107  soft-tissue]
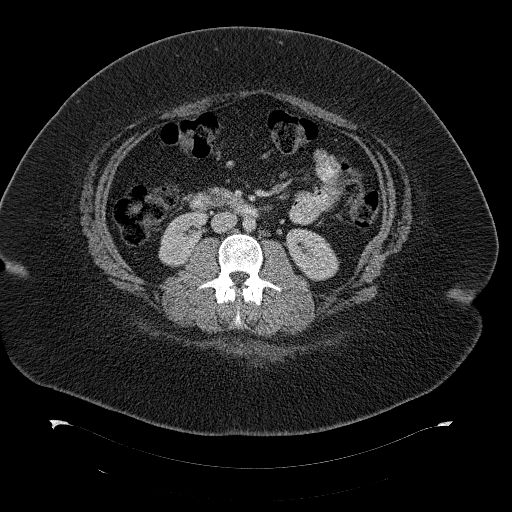
[im 70/107  soft-tissue]
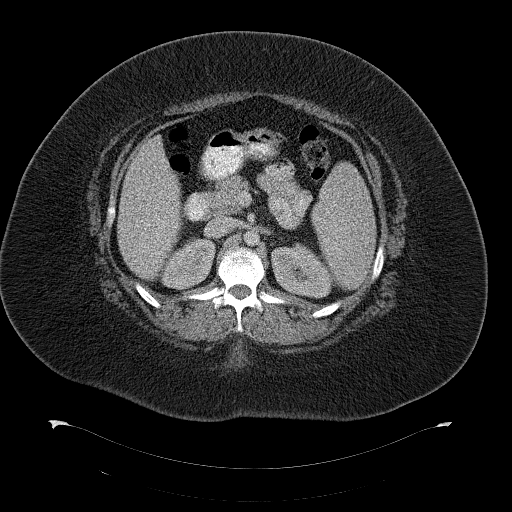
[im 70/107  bone]
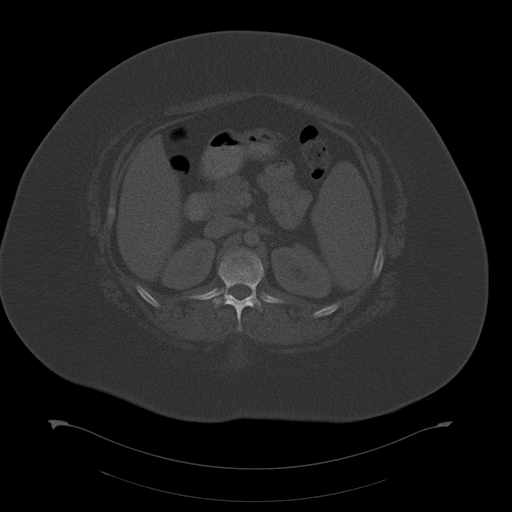
[im 79/107  soft-tissue]
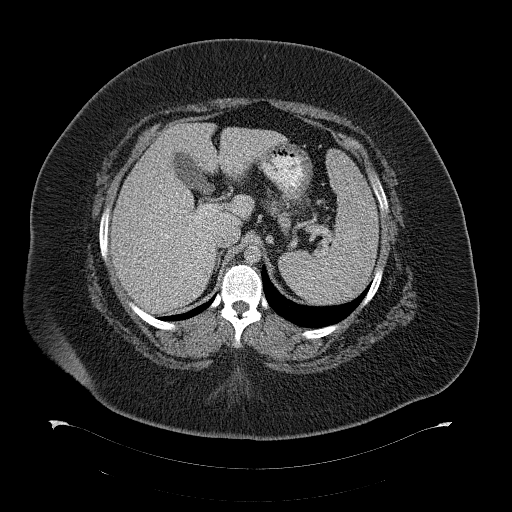
[im 83/107  soft-tissue]
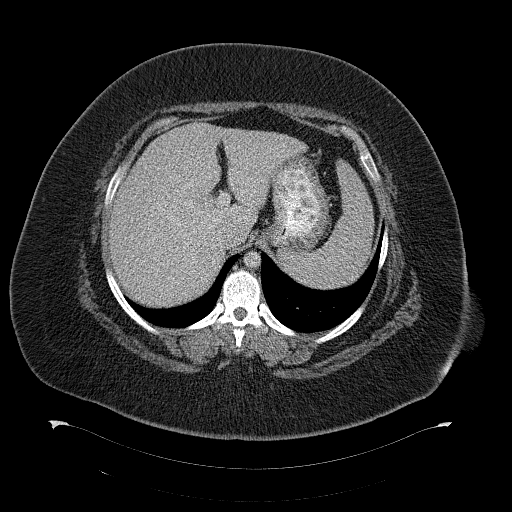
[im 93/107  soft-tissue]
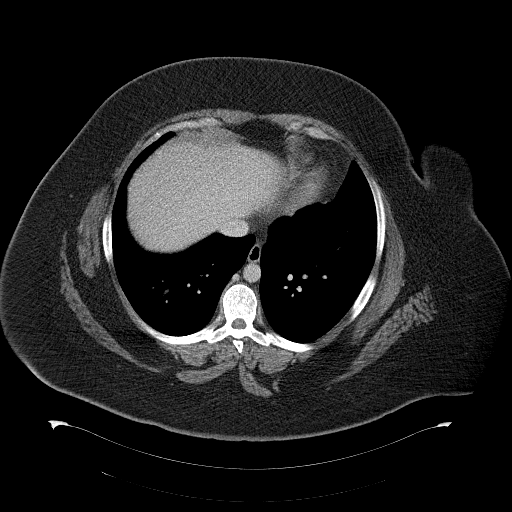
[im 102/107  soft-tissue]
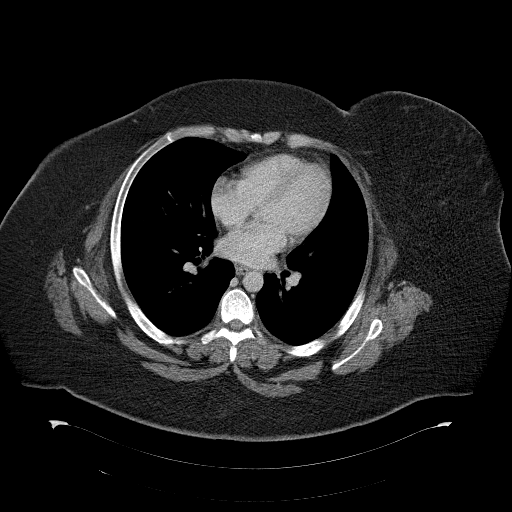

[Series 3: coronal · coronal · 0.76mm/px · 3 of 164 slices shown]
[im 55/164  soft-tissue]
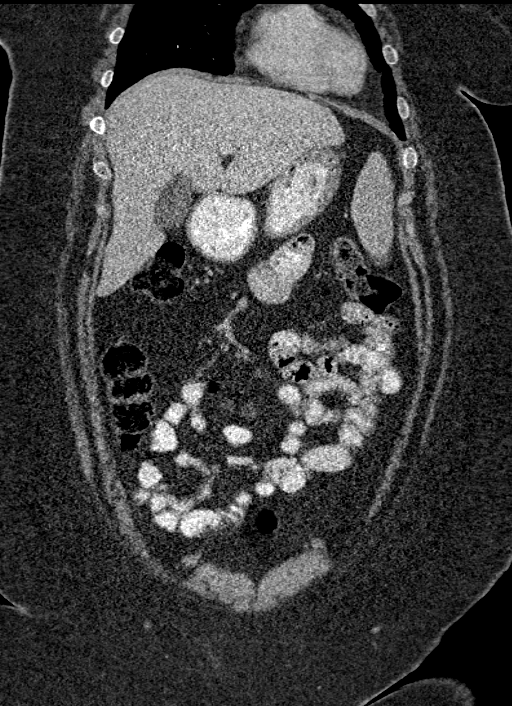
[im 73/164  soft-tissue]
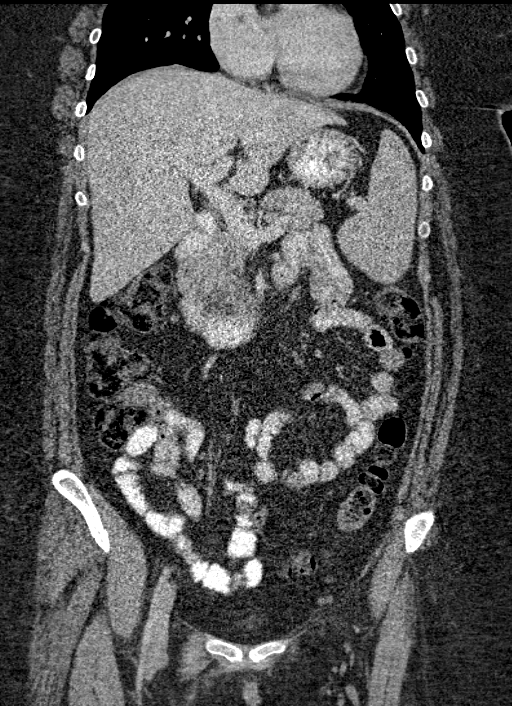
[im 91/164  soft-tissue]
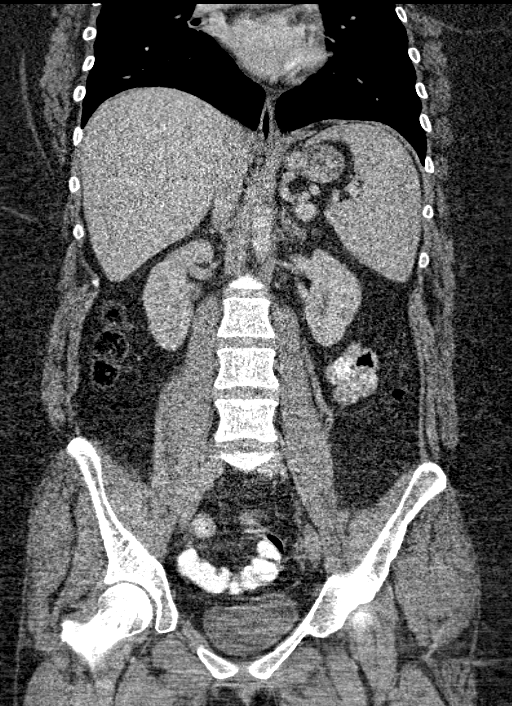

[16 of 46 positions shown; findings below may reference images not displayed]

FINDINGS: Lower chest:  No acute findings.

Hepatobiliary: Liver and gallbladder appear normal. No bile duct
dilatation appreciated.

Pancreas: No mass, inflammatory changes, or other significant
abnormality.

Spleen: Stable mild splenomegaly.

Adrenals/Urinary Tract: Adrenal glands appear normal. Mild scarring
at the upper pole of the left kidney appears stable. Kidneys
otherwise unremarkable bilaterally without mass, stone or
hydronephrosis. No ureteral or bladder calculi identified. Bladder
appears normal, partially decompressed.

Stomach/Bowel: Bowel is normal in caliber. No bowel wall thickening
or evidence of bowel wall inflammation. Appendix is normal. Stomach
appears normal.

Vascular/Lymphatic: Abdominal aorta is normal in caliber. No
vascular abnormality identified. No enlarged lymph nodes seen within
the abdomen or pelvis.

Reproductive: No mass or other significant abnormality. No mass or
free fluid within either adnexal region.

Other: No free fluid or abscess collection. No free intraperitoneal
air.

Musculoskeletal: No acute or suspicious osseous findings.
Superficial soft tissues are unremarkable.
IMPRESSION: 1. No acute findings within the abdomen or pelvis. No free fluid or
inflammatory change. No evidence of acute solid organ abnormality.
No renal or ureteral calculi. No bowel obstruction or evidence of
bowel wall inflammation. No mass or lymphadenopathy. Appendix is
normal.
2. Stable mild splenomegaly.

## 2017-09-20 ENCOUNTER — Encounter: Payer: Self-pay | Admitting: Nurse Practitioner

## 2017-09-21 NOTE — Progress Notes (Signed)
LMOM that scan was normal. Call with questions.

## 2017-10-21 ENCOUNTER — Ambulatory Visit: Payer: Medicaid Other | Admitting: Gastroenterology

## 2017-11-25 ENCOUNTER — Other Ambulatory Visit: Payer: Self-pay | Admitting: Nurse Practitioner

## 2017-11-25 DIAGNOSIS — K2950 Unspecified chronic gastritis without bleeding: Secondary | ICD-10-CM

## 2017-11-25 DIAGNOSIS — K219 Gastro-esophageal reflux disease without esophagitis: Secondary | ICD-10-CM

## 2017-11-29 ENCOUNTER — Other Ambulatory Visit: Payer: Medicaid Other

## 2017-12-02 ENCOUNTER — Ambulatory Visit (INDEPENDENT_AMBULATORY_CARE_PROVIDER_SITE_OTHER): Payer: Medicaid Other

## 2017-12-02 DIAGNOSIS — N912 Amenorrhea, unspecified: Secondary | ICD-10-CM | POA: Diagnosis not present

## 2017-12-02 NOTE — Progress Notes (Signed)
PELVIC US TA/TV:homogeneous retroflexed uterus,wnl,EEC 3.4 mm,normal left ovary,simple right ovarian cyst 3.4 x 2.8 x 2.3 cm,no free fluid,left ovary appears to slide,I did not push on right ovary because of right adnexal discomfort.

## 2017-12-03 ENCOUNTER — Telehealth: Payer: Self-pay | Admitting: Adult Health

## 2017-12-03 NOTE — Telephone Encounter (Signed)
Pt aware US showed simple cysts right ovary, still no period, make appt

## 2017-12-17 ENCOUNTER — Ambulatory Visit: Payer: Medicaid Other | Admitting: Adult Health

## 2017-12-17 ENCOUNTER — Encounter: Payer: Self-pay | Admitting: Adult Health

## 2017-12-17 VITALS — BP 100/68 | HR 89 | Ht 66.0 in | Wt 350.0 lb

## 2017-12-17 DIAGNOSIS — N83201 Unspecified ovarian cyst, right side: Secondary | ICD-10-CM | POA: Diagnosis not present

## 2017-12-17 DIAGNOSIS — Z3202 Encounter for pregnancy test, result negative: Secondary | ICD-10-CM

## 2017-12-17 DIAGNOSIS — N912 Amenorrhea, unspecified: Secondary | ICD-10-CM | POA: Diagnosis not present

## 2017-12-17 LAB — POCT URINE PREGNANCY: PREG TEST UR: NEGATIVE

## 2017-12-17 MED ORDER — MEDROXYPROGESTERONE ACETATE 10 MG PO TABS: ORAL_TABLET | ORAL | 0 refills | Status: DC

## 2017-12-17 NOTE — Progress Notes (Signed)
Subjective:     Patient ID: [name], female   DOB: 08/12/1986, 32 y.o.   MRN: [id number]  HPI [name] is a 32 year old white female, single but engaged(got ring Christmas, getting married 02/19/18),for follow up of no period in almost a year, last depo 01/15/17.  Review of Systems No period  Reviewed past medical,surgical, social and family history. Reviewed medications and allergies.     Objective:   Physical Exam BP 100/68 (BP Location: Left Arm, Patient Position: Sitting, Cuff Size: Large)   Pulse 89   Ht 5\' 6"  (1.676 m)   Wt (!) 350 lb (158.8 kg)   BMI 56.49 kg/m UPT negative. Skin warm and dry. Lungs: clear to ausculation bilaterally. Cardiovascular: regular rate and rhythm. Will rx provera 10 mg for 14 days to try to have withdrawal bleed, and check labs today.  She has lost 14 lbs.    Assessment:     1. Amenorrhea   2. Right ovarian cyst   3. Pregnancy examination or test, negative result       Plan:     Meds ordered this encounter  Medications  . medroxyPROGESTERone (PROVERA) 10 MG tablet    Sig: Take 1 daily for 14 days    Dispense:  14 tablet    Refill:  0    Order Specific Question:   Supervising Provider    Answer:   Duane LopeEURE, LUTHER H [[id number]]  Check TSH,FSH and Prolactin    Return in 3 weeks

## 2017-12-18 LAB — PROLACTIN: Prolactin: 9.5 ng/mL (ref 4.8–23.3)

## 2017-12-18 LAB — TSH: TSH: 1.69 u[IU]/mL (ref 0.450–4.500)

## 2017-12-18 LAB — FOLLICLE STIMULATING HORMONE: FSH: 6 m[IU]/mL

## 2018-01-06 ENCOUNTER — Ambulatory Visit: Payer: Medicaid Other | Admitting: Adult Health

## 2018-01-07 ENCOUNTER — Ambulatory Visit (HOSPITAL_COMMUNITY)
Admission: RE | Admit: 2018-01-07 | Discharge: 2018-01-07 | Disposition: A | Discharge status: Home / Self Care | Payer: Medicaid Other | Source: Ambulatory Visit | Attending: Neurology | Admitting: Neurology

## 2018-01-07 ENCOUNTER — Other Ambulatory Visit (HOSPITAL_COMMUNITY): Payer: Self-pay | Admitting: Neurology

## 2018-01-07 DIAGNOSIS — M545 Low back pain: Secondary | ICD-10-CM

## 2018-01-17 ENCOUNTER — Ambulatory Visit: Payer: Medicaid Other | Admitting: Nurse Practitioner

## 2018-01-17 ENCOUNTER — Ambulatory Visit: Payer: Medicaid Other | Admitting: Adult Health

## 2018-02-22 ENCOUNTER — Encounter: Payer: Self-pay | Admitting: Adult Health

## 2018-05-10 ENCOUNTER — Emergency Department (HOSPITAL_COMMUNITY)
Admission: EM | Admit: 2018-05-10 | Discharge: 2018-05-10 | Disposition: A | Discharge status: Home / Self Care | Payer: Medicaid Other | Attending: Emergency Medicine | Admitting: Emergency Medicine

## 2018-05-10 ENCOUNTER — Other Ambulatory Visit: Payer: Self-pay

## 2018-05-10 ENCOUNTER — Encounter (HOSPITAL_COMMUNITY): Payer: Self-pay | Admitting: Emergency Medicine

## 2018-05-10 DIAGNOSIS — W228XXA Striking against or struck by other objects, initial encounter: Secondary | ICD-10-CM | POA: Insufficient documentation

## 2018-05-10 DIAGNOSIS — Z87891 Personal history of nicotine dependence: Secondary | ICD-10-CM | POA: Diagnosis not present

## 2018-05-10 DIAGNOSIS — Y998 Other external cause status: Secondary | ICD-10-CM | POA: Diagnosis not present

## 2018-05-10 DIAGNOSIS — Y929 Unspecified place or not applicable: Secondary | ICD-10-CM | POA: Insufficient documentation

## 2018-05-10 DIAGNOSIS — Y9389 Activity, other specified: Secondary | ICD-10-CM | POA: Diagnosis not present

## 2018-05-10 DIAGNOSIS — Z79899 Other long term (current) drug therapy: Secondary | ICD-10-CM | POA: Insufficient documentation

## 2018-05-10 DIAGNOSIS — S91312A Laceration without foreign body, left foot, initial encounter: Secondary | ICD-10-CM | POA: Diagnosis present

## 2018-05-10 DIAGNOSIS — F419 Anxiety disorder, unspecified: Secondary | ICD-10-CM | POA: Diagnosis not present

## 2018-05-10 MED ORDER — CEPHALEXIN 500 MG PO CAPS: 500.0000 mg | ORAL_CAPSULE | Freq: Four times a day (QID) | ORAL | 0 refills | Status: DC

## 2018-05-10 MED ORDER — ACETAMINOPHEN 500 MG PO TABS
1000.0000 mg | ORAL_TABLET | Freq: Once | ORAL | Status: AC
  Administered 2018-05-10: 1000 mg via ORAL
  Filled 2018-05-10: qty 2

## 2018-05-10 MED ORDER — CEPHALEXIN 500 MG PO CAPS
500.0000 mg | ORAL_CAPSULE | Freq: Once | ORAL | Status: AC
  Administered 2018-05-10: 500 mg via ORAL
  Filled 2018-05-10: qty 1

## 2018-05-10 MED ORDER — ONDANSETRON HCL 4 MG PO TABS
4.0000 mg | ORAL_TABLET | Freq: Once | ORAL | Status: AC
Start: 2018-05-10 — End: 2018-05-10
  Administered 2018-05-10: 4 mg via ORAL
  Filled 2018-05-10: qty 1

## 2018-05-10 NOTE — ED Provider Notes (Signed)
[facility] Provider Note   CSN: [id number] Arrival date & time: 05/10/18  1550     History   Chief Complaint Chief Complaint  Patient presents with  . Laceration    HPI [name] is a 32 y.o. female.  Patient is a 32 year old female who presents to the emergency department with her legal guardian because of a laceration to the left foot.  The patient states that she was using some equipment to do a pedicure when she sustained a laceration to her foot.  This occurred about 1-1/2 weeks ago.  She states she still having some pain and discomfort.  She is concerned about possible infection.  There is been no drainage noted.  Legal guardian reports that he has not seen any red streaks going up the leg.  The patient is not diabetic, and believes the tetanus shot is up-to-date.     Past Medical History:  Diagnosis Date  . Abdominal pain, chronic, epigastric   . Abdominal pain, chronic, right lower quadrant   . Bulging disc   . Constipation   . Contraceptive management 07/18/2014  . Disk prolapse   . Elevated liver enzymes 12/01/2013  . Fatty liver 07/23/2017  . Gastritis   . GERD (gastroesophageal reflux disease)   . Headache   . Herpes   . Mental disorder    anxiety  . Obesity   . Other and unspecified ovarian cyst 12/01/2013  . Ovarian cyst, right   . Splenomegaly 07/23/2017    Patient Active Problem List   Diagnosis Date Noted  . Encounter for gynecological examination with Papanicolaou smear of cervix 08/18/2017  . Weight gain 07/28/2017  . RUQ pain 07/28/2017  . Amenorrhea 07/28/2017  . Fatty liver 07/23/2017  . Splenomegaly 07/23/2017  . Constipation 01/01/2016  . Abdominal pain 01/01/2016  . Obesity 07/25/2015  . Vomiting and diarrhea 11/11/2014  . Acute gastroenteritis 11/11/2014  . UTI (lower urinary tract infection) 11/11/2014  . Hypokalemia 11/11/2014  . Morbid obesity (HCC) 11/11/2014  . Contraceptive management 07/18/2014  .  Abdominal pain, acute 06/08/2014  . Nausea & vomiting 06/08/2014  . Dehydration 06/08/2014  . Intractable nausea and vomiting 06/08/2014  . Gastritis   . Epigastric pain   . Abdominal pain, chronic, right lower quadrant musculoskeletal 02/06/2014  . Other and unspecified ovarian cyst 12/05/2013  . Other and unspecified ovarian cyst 12/01/2013  . Elevated liver enzymes 12/01/2013  . Internal hemorrhoids with other complication 11/23/2013  . Unspecified constipation 08/07/2013  . Abdominal pain, other specified site 02/03/2013  . Rectal bleeding 02/03/2013  . Lumbar pain 07/14/2012  . Lumbar radiculopathy 07/14/2012  . Nausea and vomiting 01/11/2012  . Esophageal reflux 01/11/2012  . KNEE PAIN 06/13/2009  . CONTUSION, LEFT KNEE 06/13/2009    Past Surgical History:  Procedure Laterality Date  . COLONOSCOPY WITH ESOPHAGOGASTRODUODENOSCOPY (EGD) N/A 02/24/2013   WUJ:[id number]:INTERMITTENT RECTAL BLEEDING DUE TO Moderate sized internal hemorrhoids, EGD: erosive esophagitis due to uncontrolled reflux, moderate erosive gastritis, benign path  . ESOPHAGOGASTRODUODENOSCOPY  01/19/2012   OZH:YQMVHQ,IONGEXBMSLF:ULCERS,MULTIPLE IN THE ANTRUM/HIATAL HERNIA/  . ESOPHAGOGASTRODUODENOSCOPY N/A 07/31/2016   Procedure: ESOPHAGOGASTRODUODENOSCOPY (EGD);  Surgeon: West BaliSandi L Fields, MD;  Location: [facility];  Service: Endoscopy;  Laterality: N/A;  10:15 AM  . WISDOM TOOTH EXTRACTION       OB History    Gravida  1   Para  1   Term  1   Preterm      AB  Living  1     SAB      TAB      Ectopic      Multiple      Live Births  1            Home Medications    Prior to Admission medications   Medication Sig Start Date End Date Taking? Authorizing Provider  DULoxetine (CYMBALTA) 60 MG capsule Take 60 mg by mouth at bedtime.    [provider]  FIBER, CORN DEXTRIN, PO Take 1 tablet by mouth daily.    [provider]  Lactobacillus (ACIDOPHILUS PROBIOTIC) 10 MG TABS Take 10 mg by mouth 3  (three) times daily. 03/02/17   Everlene Farrier, PA-C  linaclotide (LINZESS) 145 MCG CAPS capsule Take 145 mcg by mouth daily before breakfast.    [provider]  LYRICA 150 MG capsule Take 150 mg by mouth 2 (two) times daily. 07/29/15   [provider]  medroxyPROGESTERone (PROVERA) 10 MG tablet Take 1 daily for 14 days 12/17/17   Cyril Mourning A, NP  Melatonin 5 MG TABS Take 1 tablet by mouth at bedtime.    [provider]  nystatin-triamcinolone (MYCOLOG II) cream Apply 1 application topically 2 (two) times daily. 07/21/17   Adline Potter, NP  pantoprazole (PROTONIX) 40 MG tablet TAKE 1 TABLET (40 MG TOTAL) BY MOUTH 2 (TWO) TIMES DAILY BEFORE A MEAL. 11/25/17   Tiffany Kocher, PA-C  pravastatin (PRAVACHOL) 40 MG tablet Take 40 mg by mouth at bedtime. 01/20/17   [provider]  promethazine (PHENERGAN) 25 MG tablet Take 1 tablet (25 mg total) by mouth every 6 (six) hours as needed for nausea or vomiting. 08/04/17   Adline Potter, NP  tiZANidine (ZANAFLEX) 2 MG tablet Take 2 mg by mouth every 8 (eight) hours as needed for muscle spasms.  05/21/16   [provider]  topiramate (TOPAMAX) 100 MG tablet Take 300 mg by mouth daily. 07/29/15   [provider]  traMADol (ULTRAM) 50 MG tablet Take 1 tablet (50 mg total) by mouth every 6 (six) hours as needed. Patient taking differently: Take 50 mg by mouth every 6 (six) hours as needed for moderate pain.  05/05/16   Ivery Quale, PA-C  valACYclovir (VALTREX) 1000 MG tablet TAKE 1 TABLET BY MOUTH EVERY DAY 07/20/17   Adline Potter, NP    Family History Family History  Problem Relation Age of Onset  . GER disease Mother   . Other Mother        diverticulitis; hernia  . Diabetes Mother        borderline  . Diabetes Maternal Grandmother   . COPD Maternal Grandmother   . Diabetes Maternal Grandfather   . Congestive Heart Failure Maternal Grandfather   . Headache Son   . Diabetes Other    . Colon cancer Neg Hx   . Colon polyps Neg Hx   . Gastric cancer Neg Hx   . Esophageal cancer Neg Hx     Social History Social History   Tobacco Use  . Smoking status: Former Smoker    Packs/day: 0.00    Years: 0.50    Pack years: 0.00    Types: Cigarettes    Last attempt to quit: 07/28/2011    Years since quitting: 6.7  . Smokeless tobacco: Never Used  . Tobacco comment: stopped in 2009  Substance Use Topics  . Alcohol use: No  . Drug use: No  Allergies   Naproxen   Review of Systems Review of Systems  Constitutional: Negative for activity change.       All ROS Neg except as noted in HPI  HENT: Negative for nosebleeds.   Eyes: Negative for photophobia and discharge.  Respiratory: Negative for cough, shortness of breath and wheezing.   Cardiovascular: Negative for chest pain and palpitations.  Gastrointestinal: Negative for abdominal pain and blood in stool.  Genitourinary: Negative for dysuria, frequency and hematuria.  Musculoskeletal: Negative for arthralgias, back pain and neck pain.  Skin: Positive for wound.       Laceration foot  Neurological: Negative for dizziness, seizures and speech difficulty.  Psychiatric/Behavioral: Negative for confusion and hallucinations.     Physical Exam Updated Vital Signs BP 118/77 (BP Location: Right Arm)   Pulse 99   Temp 98.8 F (37.1 C) (Oral)   Resp 14   Ht 5\' 6"  (1.676 m)   Wt (!) 158.8 kg (350 lb)   SpO2 98%   BMI 56.49 kg/m   Physical Exam  Constitutional: She is oriented to person, place, and time. She appears well-developed and well-nourished.  Non-toxic appearance.  HENT:  Head: Normocephalic.  Right Ear: Tympanic membrane and external ear normal.  Left Ear: Tympanic membrane and external ear normal.  Eyes: Pupils are equal, round, and reactive to light. EOM and lids are normal.  Neck: Normal range of motion. Neck supple. Carotid bruit is not present.  Cardiovascular: Normal rate, regular rhythm,  normal heart sounds, intact distal pulses and normal pulses.  Pulmonary/Chest: Breath sounds normal. No respiratory distress.  Abdominal: Soft. Bowel sounds are normal. There is no tenderness. There is no guarding.  Musculoskeletal: Normal range of motion.       Left foot: There is tenderness and laceration.       Feet:  Lateral aspect of the left 5th toe is tender to palpation. 1cm laceration. No red streaks or drainage. FROM of all toes, ankle, and knee on the left.  Lymphadenopathy:       Head (right side): No submandibular adenopathy present.       Head (left side): No submandibular adenopathy present.    She has no cervical adenopathy.  Neurological: She is alert and oriented to person, place, and time. She has normal strength. No cranial nerve deficit or sensory deficit.  Skin: Skin is warm and dry.  Psychiatric: She has a normal mood and affect. Her speech is normal.  Nursing note and vitals reviewed.    ED Treatments / Results  Labs (all labs ordered are listed, but only abnormal results are displayed) Labs Reviewed - No data to display  EKG None  Radiology No results found.  Procedures Procedures (including critical care time)  Medications Ordered in ED Medications - No data to display   Initial Impression / Assessment and Plan / ED Course  I have reviewed the triage vital signs and the nursing notes.  Pertinent labs & imaging results that were available during my care of the patient were reviewed by me and considered in my medical decision making (see chart for details).       Final Clinical Impressions(s) / ED Diagnoses MDM  Vital signs within normal limits.  Pulse oximetry is 98% on room air.  Within normal limits by my interpretation.  The patient sustained a laceration 1-1/2 weeks ago.  The area is still slightly open, but there is mild granulation tissue present.  There is no drainage, and no red streaks  appreciated.  No evidence of any major  infection.  No neurovascular deficits appreciated.  The patient will be asked to use Tylenol extra strength for soreness, to elevate the foot when possible, and will be given a prescription for Keflex.  The patient is to follow-up with Dr. Selena Batten in the office for follow-up and recheck.  Patient and guardian are in agreement with this plan.   Final diagnoses:  Laceration of left foot, initial encounter    ED Discharge Orders        Ordered    cephALEXin (KEFLEX) 500 MG capsule  4 times daily     05/10/18 1634       Ivery Quale, PA-C 05/10/18 1650    Benjiman Core, MD 05/10/18 2324

## 2018-05-10 NOTE — ED Triage Notes (Signed)
Patient complains of 1cm laceration to lateral left food x 2 weeks. Patient states she tried "treating on it at home."

## 2018-05-10 NOTE — Discharge Instructions (Addendum)
The Steri-Strip at your wound site will come off in about 5 to 7 days.  Let it come off on its own.  Make sure you dry your feet carefully and apply a Band-Aid after bath or shower.  Please use Keflex with each meal.  Please see Dr. Selena BattenKim, or return to the emergency department if any signs of advancing infection.

## 2018-05-31 ENCOUNTER — Ambulatory Visit: Payer: Medicaid Other | Admitting: Adult Health

## 2018-05-31 ENCOUNTER — Ambulatory Visit (INDEPENDENT_AMBULATORY_CARE_PROVIDER_SITE_OTHER): Payer: Medicaid Other | Admitting: Psychiatry

## 2018-05-31 ENCOUNTER — Encounter (HOSPITAL_COMMUNITY): Payer: Self-pay | Admitting: Psychiatry

## 2018-05-31 ENCOUNTER — Encounter: Payer: Self-pay | Admitting: Adult Health

## 2018-05-31 VITALS — BP 121/76 | HR 84 | Ht 66.0 in | Wt 329.4 lb

## 2018-05-31 DIAGNOSIS — F331 Major depressive disorder, recurrent, moderate: Secondary | ICD-10-CM | POA: Diagnosis not present

## 2018-05-31 DIAGNOSIS — N926 Irregular menstruation, unspecified: Secondary | ICD-10-CM

## 2018-05-31 DIAGNOSIS — Z3202 Encounter for pregnancy test, result negative: Secondary | ICD-10-CM

## 2018-05-31 DIAGNOSIS — F329 Major depressive disorder, single episode, unspecified: Secondary | ICD-10-CM | POA: Insufficient documentation

## 2018-05-31 DIAGNOSIS — F32A Depression, unspecified: Secondary | ICD-10-CM | POA: Insufficient documentation

## 2018-05-31 LAB — POCT URINE PREGNANCY: Preg Test, Ur: NEGATIVE

## 2018-05-31 MED ORDER — CLONAZEPAM 0.5 MG PO TABS: 0.5000 mg | ORAL_TABLET | Freq: Two times a day (BID) | ORAL | 2 refills | Status: DC | PRN

## 2018-05-31 MED ORDER — NORETHIN ACE-ETH ESTRAD-FE 1-20 MG-MCG(24) PO CAPS: 1.0000 | ORAL_CAPSULE | Freq: Every day | ORAL | 12 refills | Status: DC
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
## 2018-06-15 ENCOUNTER — Encounter: Payer: Self-pay | Admitting: Adult Health

## 2018-06-20 ENCOUNTER — Other Ambulatory Visit: Payer: Self-pay

## 2018-06-20 ENCOUNTER — Encounter (HOSPITAL_COMMUNITY): Payer: Self-pay | Admitting: Emergency Medicine

## 2018-06-20 ENCOUNTER — Emergency Department (HOSPITAL_COMMUNITY)
Admission: EM | Admit: 2018-06-20 | Discharge: 2018-06-21 | Disposition: A | Discharge status: Home / Self Care | Payer: Medicaid Other | Attending: Emergency Medicine | Admitting: Emergency Medicine

## 2018-06-20 ENCOUNTER — Emergency Department (HOSPITAL_COMMUNITY): Payer: Medicaid Other

## 2018-06-20 DIAGNOSIS — Z79899 Other long term (current) drug therapy: Secondary | ICD-10-CM | POA: Diagnosis not present

## 2018-06-20 DIAGNOSIS — R161 Splenomegaly, not elsewhere classified: Secondary | ICD-10-CM | POA: Diagnosis not present

## 2018-06-20 DIAGNOSIS — R1031 Right lower quadrant pain: Secondary | ICD-10-CM

## 2018-06-20 DIAGNOSIS — Z87891 Personal history of nicotine dependence: Secondary | ICD-10-CM | POA: Insufficient documentation

## 2018-06-20 LAB — CBC
HCT: 40.7 % (ref 36.0–46.0)
HEMOGLOBIN: 14.1 g/dL (ref 12.0–15.0)
MCH: 31.8 pg (ref 26.0–34.0)
MCHC: 34.6 g/dL (ref 30.0–36.0)
MCV: 91.9 fL (ref 78.0–100.0)
PLATELETS: 242 10*3/uL (ref 150–400)
RBC: 4.43 MIL/uL (ref 3.87–5.11)
RDW: 13.4 % (ref 11.5–15.5)
WBC: 11.6 10*3/uL — AB (ref 4.0–10.5)

## 2018-06-20 LAB — COMPREHENSIVE METABOLIC PANEL
ALK PHOS: 90 U/L (ref 38–126)
ALT: 12 U/L (ref 0–44)
ANION GAP: 6 (ref 5–15)
AST: 24 U/L (ref 15–41)
Albumin: 3.5 g/dL (ref 3.5–5.0)
BUN: 11 mg/dL (ref 6–20)
CALCIUM: 9.1 mg/dL (ref 8.9–10.3)
CHLORIDE: 112 mmol/L — AB (ref 98–111)
CO2: 20 mmol/L — AB (ref 22–32)
Creatinine, Ser: 0.89 mg/dL (ref 0.44–1.00)
Glucose, Bld: 107 mg/dL — ABNORMAL HIGH (ref 70–99)
Potassium: 3.7 mmol/L (ref 3.5–5.1)
SODIUM: 138 mmol/L (ref 135–145)
Total Bilirubin: 0.5 mg/dL (ref 0.3–1.2)
Total Protein: 7.7 g/dL (ref 6.5–8.1)

## 2018-06-20 LAB — URINALYSIS, ROUTINE W REFLEX MICROSCOPIC
BILIRUBIN URINE: NEGATIVE
Bacteria, UA: NONE SEEN
Glucose, UA: NEGATIVE mg/dL
Hgb urine dipstick: NEGATIVE
KETONES UR: NEGATIVE mg/dL
Nitrite: NEGATIVE
PROTEIN: NEGATIVE mg/dL
Specific Gravity, Urine: 1.027 (ref 1.005–1.030)
pH: 6 (ref 5.0–8.0)

## 2018-06-20 LAB — I-STAT BETA HCG BLOOD, ED (MC, WL, AP ONLY): I-stat hCG, quantitative: <5 m[IU]/mL (ref <5)

## 2018-06-20 LAB — LIPASE, BLOOD: LIPASE: 36 U/L (ref 11–51)

## 2018-06-20 MED ORDER — IOPAMIDOL (ISOVUE-300) INJECTION 61%
100.0000 mL | Freq: Once | INTRAVENOUS | Status: AC | PRN
  Administered 2018-06-20: 100 mL via INTRAVENOUS

## 2018-06-20 MED ORDER — MORPHINE SULFATE (PF) 4 MG/ML IV SOLN
4.0000 mg | Freq: Once | INTRAVENOUS | Status: AC
Start: 2018-06-20 — End: 2018-06-20
  Administered 2018-06-20: 4 mg via INTRAVENOUS
  Filled 2018-06-20: qty 1

## 2018-06-20 MED ORDER — ONDANSETRON HCL 4 MG/2ML IJ SOLN
4.0000 mg | Freq: Once | INTRAMUSCULAR | Status: AC
  Administered 2018-06-20: 4 mg via INTRAVENOUS
  Filled 2018-06-20: qty 2

## 2018-06-20 NOTE — ED Triage Notes (Signed)
Pt c/o upper abd pain x 1 one week and now radiating around to right flank. Denies vag dc. Denies gu sx's. Denies v/d. C/o some nausea. nad

## 2018-06-21 MED ORDER — ONDANSETRON 4 MG PO TBDP: 4.0000 mg | ORAL_TABLET | Freq: Three times a day (TID) | ORAL | 0 refills | Status: DC | PRN

## 2018-06-21 NOTE — ED Provider Notes (Signed)
[facility] EMERGENCY DEPARTMENT Provider Note   CSN: [id number] Arrival date & time: 06/20/18  1750     History   Chief Complaint Chief Complaint  Patient presents with  . Abdominal Pain  . Nausea    HPI [name] is a 32 y.o. female with a history as outlined below, most significant for a history of GERD, chronic transaminase levels secondary to fatty liver, chronic abdominal pain presenting with a one week history of upper abdominal pain described as constant and aching with nausea without emesis and subjective fever with reduced appetite, with radiation of pain into her right lower quadrant today.  She denies vomiting, also denies constipation or diarrhea, last bm was yesterday. Also denies dysuria, hematuria or vaginal discharge.  She was recently started on ocps last month by her gynecologist, no other new meds.  She is on protonix and Linzess for her chronic GI complaints.Pain is worsened with movement, better at rest, especially supine, worse sitting up.   The history is provided by the patient.    Past Medical History:  Diagnosis Date  . Abdominal pain, chronic, epigastric   . Abdominal pain, chronic, right lower quadrant   . Anxiety   . Bulging disc   . Constipation   . Contraceptive management 07/18/2014  . Depression   . Disk prolapse   . Elevated liver enzymes 12/01/2013  . Fatty liver 07/23/2017  . Gastritis   . GERD (gastroesophageal reflux disease)   . Headache   . Herpes   . Mental disorder    anxiety  . Obesity   . Other and unspecified ovarian cyst 12/01/2013  . Ovarian cyst, right   . Splenomegaly 07/23/2017    Patient Active Problem List   Diagnosis Date Noted  . Depression 05/31/2018  . Encounter for gynecological examination with Papanicolaou smear of cervix 08/18/2017  . Weight gain 07/28/2017  . RUQ pain 07/28/2017  . Amenorrhea 07/28/2017  . Fatty liver 07/23/2017  . Splenomegaly 07/23/2017  . Constipation 01/01/2016  . Abdominal pain  01/01/2016  . Obesity 07/25/2015  . Vomiting and diarrhea 11/11/2014  . Acute gastroenteritis 11/11/2014  . UTI (lower urinary tract infection) 11/11/2014  . Hypokalemia 11/11/2014  . Morbid obesity (HCC) 11/11/2014  . Contraceptive management 07/18/2014  . Abdominal pain, acute 06/08/2014  . Nausea & vomiting 06/08/2014  . Dehydration 06/08/2014  . Intractable nausea and vomiting 06/08/2014  . Gastritis   . Epigastric pain   . Abdominal pain, chronic, right lower quadrant musculoskeletal 02/06/2014  . Other and unspecified ovarian cyst 12/05/2013  . Other and unspecified ovarian cyst 12/01/2013  . Elevated liver enzymes 12/01/2013  . Internal hemorrhoids with other complication 11/23/2013  . Unspecified constipation 08/07/2013  . Abdominal pain, other specified site 02/03/2013  . Rectal bleeding 02/03/2013  . Lumbar pain 07/14/2012  . Lumbar radiculopathy 07/14/2012  . Nausea and vomiting 01/11/2012  . Esophageal reflux 01/11/2012  . KNEE PAIN 06/13/2009  . CONTUSION, LEFT KNEE 06/13/2009    Past Surgical History:  Procedure Laterality Date  . COLONOSCOPY WITH ESOPHAGOGASTRODUODENOSCOPY (EGD) N/A 02/24/2013   WUJ:[id number] RECTAL BLEEDING DUE TO Moderate sized internal hemorrhoids, EGD: erosive esophagitis due to uncontrolled reflux, moderate erosive gastritis, benign path  . ESOPHAGOGASTRODUODENOSCOPY  01/19/2012   OZH:YQMVHQ,IONGEXBM IN THE ANTRUM/HIATAL HERNIA/  . ESOPHAGOGASTRODUODENOSCOPY N/A 07/31/2016   Procedure: ESOPHAGOGASTRODUODENOSCOPY (EGD);  Surgeon: West Bali, MD;  Location: [facility];  Service: Endoscopy;  Laterality: N/A;  10:15 AM  . WISDOM TOOTH EXTRACTION  OB History    Gravida  1   Para  1   Term  1   Preterm      AB      Living  1     SAB      TAB      Ectopic      Multiple      Live Births  1            Home Medications    Prior to Admission medications   Medication Sig Start Date End Date Taking?  Authorizing Provider  clonazePAM (KLONOPIN) 0.5 MG tablet Take 1 tablet (0.5 mg total) by mouth 2 (two) times daily as needed for anxiety. Patient not taking: Reported on 05/31/2018 05/31/18 05/31/19  Myrlene Broker, MD  DULoxetine (CYMBALTA) 60 MG capsule Take 60 mg by mouth at bedtime.    [provider]  FIBER, CORN DEXTRIN, PO Take 1 tablet by mouth daily.    [provider]  Lactobacillus (ACIDOPHILUS PROBIOTIC) 10 MG TABS Take 10 mg by mouth 3 (three) times daily. 03/02/17   Everlene Farrier, PA-C  linaclotide (LINZESS) 145 MCG CAPS capsule Take 145 mcg by mouth daily before breakfast.    [provider]  LYRICA 150 MG capsule Take 150 mg by mouth 2 (two) times daily. 07/29/15   [provider]  Melatonin 5 MG TABS Take 1 tablet by mouth at bedtime.    [provider]  Norethin Ace-Eth Estrad-FE (TAYTULLA) 1-20 MG-MCG(24) CAPS Take 1 tablet by mouth daily. 05/31/18   Adline Potter, NP  nystatin-triamcinolone (MYCOLOG II) cream Apply 1 application topically 2 (two) times daily. Patient not taking: Reported on 05/31/2018 07/21/17   Cyril Mourning A, NP  ondansetron (ZOFRAN ODT) 4 MG disintegrating tablet Take 1 tablet (4 mg total) by mouth every 8 (eight) hours as needed for nausea or vomiting. 06/21/18   Burgess Amor, PA-C  pantoprazole (PROTONIX) 40 MG tablet TAKE 1 TABLET (40 MG TOTAL) BY MOUTH 2 (TWO) TIMES DAILY BEFORE A MEAL. Patient not taking: Reported on 05/31/2018 11/25/17   Tiffany Kocher, PA-C  pravastatin (PRAVACHOL) 40 MG tablet Take 40 mg by mouth at bedtime. 01/20/17   [provider]  promethazine (PHENERGAN) 25 MG tablet Take 1 tablet (25 mg total) by mouth every 6 (six) hours as needed for nausea or vomiting. Patient not taking: Reported on 05/31/2018 08/04/17   Adline Potter, NP  tiZANidine (ZANAFLEX) 2 MG tablet Take 2 mg by mouth every 8 (eight) hours as needed for muscle spasms.  05/21/16   [provider]    traMADol (ULTRAM) 50 MG tablet Take 1 tablet (50 mg total) by mouth every 6 (six) hours as needed. Patient taking differently: Take 50 mg by mouth every 6 (six) hours as needed for moderate pain.  05/05/16   Ivery Quale, PA-C  traZODone (DESYREL) 100 MG tablet Take 100 mg by mouth at bedtime.    [provider]  valACYclovir (VALTREX) 1000 MG tablet TAKE 1 TABLET BY MOUTH EVERY DAY 07/20/17   Adline Potter, NP    Family History Family History  Problem Relation Age of Onset  . GER disease Mother   . Other Mother        diverticulitis; hernia  . Diabetes Mother        borderline  . Anxiety disorder Mother   . Depression Mother   . Diabetes Maternal Grandmother   . COPD Maternal Grandmother   .  Diabetes Maternal Grandfather   . Congestive Heart Failure Maternal Grandfather   . Headache Son   . ADD / ADHD Son   . Diabetes Other   . Colon cancer Neg Hx   . Colon polyps Neg Hx   . Gastric cancer Neg Hx   . Esophageal cancer Neg Hx     Social History Social History   Tobacco Use  . Smoking status: Former Smoker    Packs/day: 0.00    Years: 0.50    Pack years: 0.00    Types: Cigarettes    Last attempt to quit: 07/28/2011    Years since quitting: 6.9  . Smokeless tobacco: Never Used  . Tobacco comment: stopped in 2009  Substance Use Topics  . Alcohol use: No  . Drug use: No     Allergies   Naproxen   Review of Systems Review of Systems  Constitutional: Positive for fever.  HENT: Negative.  Negative for congestion and sore throat.   Eyes: Negative.   Respiratory: Negative for chest tightness and shortness of breath.   Cardiovascular: Negative for chest pain.  Gastrointestinal: Positive for abdominal pain and nausea. Negative for constipation, diarrhea and vomiting.  Genitourinary: Negative.  Negative for dysuria and vaginal discharge.  Musculoskeletal: Negative for arthralgias, joint swelling and neck pain.  Skin: Negative.  Negative for rash and  wound.  Neurological: Negative for dizziness, weakness, light-headedness, numbness and headaches.  Psychiatric/Behavioral: Negative.      Physical Exam Updated Vital Signs BP 104/61 (BP Location: Right Arm)   Pulse 71   Temp 97.9 F (36.6 C) (Oral)   Resp 16   LMP 02/03/2018   SpO2 100%   Physical Exam  Constitutional: She appears well-developed and well-nourished.  HENT:  Head: Normocephalic and atraumatic.  Eyes: Conjunctivae are normal.  Neck: Normal range of motion.  Cardiovascular: Normal rate, regular rhythm, normal heart sounds and intact distal pulses.  Pulmonary/Chest: Effort normal and breath sounds normal. She has no wheezes.  Abdominal: Soft. Bowel sounds are normal. There is tenderness in the right lower quadrant and left upper quadrant. There is no rigidity, no rebound and no guarding.  Obese abdomen. Mild ttp LUQ. Tender rlq without guard or rebound.  Musculoskeletal: Normal range of motion.  Neurological: She is alert.  Skin: Skin is warm and dry.  Psychiatric: She has a normal mood and affect.  Nursing note and vitals reviewed.    ED Treatments / Results  Labs (all labs ordered are listed, but only abnormal results are displayed) Labs Reviewed  COMPREHENSIVE METABOLIC PANEL - Abnormal; Notable for the following components:      Result Value   Chloride 112 (*)    CO2 20 (*)    Glucose, Bld 107 (*)    All other components within normal limits  CBC - Abnormal; Notable for the following components:   WBC 11.6 (*)    All other components within normal limits  URINALYSIS, ROUTINE W REFLEX MICROSCOPIC - Abnormal; Notable for the following components:   APPearance HAZY (*)    Leukocytes, UA MODERATE (*)    All other components within normal limits  LIPASE, BLOOD  I-STAT BETA HCG BLOOD, ED ([facility], [facility], [facility] ONLY)    EKG None  Radiology Ct Abdomen Pelvis W Contrast  Result Date: 06/21/2018 CLINICAL DATA:  Acute onset of upper abdominal pain, radiating to  the right flank. Nausea. EXAM: CT ABDOMEN AND PELVIS WITH CONTRAST TECHNIQUE: Multidetector CT imaging of the abdomen and pelvis was  performed using the standard protocol following bolus administration of intravenous contrast. CONTRAST:  100mL ISOVUE-300 IOPAMIDOL (ISOVUE-300) INJECTION 61% COMPARISON:  Pelvic ultrasound performed 12/02/2017, and CT of the abdomen and pelvis performed 03/02/2017 FINDINGS: Lower chest: The visualized lung bases are grossly clear. The visualized portions of the mediastinum are unremarkable. Hepatobiliary: The liver is unremarkable in appearance. The gallbladder is unremarkable in appearance. The common bile duct remains normal in caliber. Pancreas: The pancreas is within normal limits. Spleen: The spleen is enlarged, measuring 15.3 cm in length. Adrenals/Urinary Tract: The adrenal glands are unremarkable in appearance. The kidneys are within normal limits. There is no evidence of hydronephrosis. No renal or ureteral stones are identified. No perinephric stranding is seen. Stomach/Bowel: The stomach is unremarkable in appearance. The small bowel is within normal limits. The appendix is normal in caliber, without evidence of appendicitis. The colon is unremarkable in appearance. Vascular/Lymphatic: The abdominal aorta is unremarkable in appearance. The inferior vena cava is grossly unremarkable. No retroperitoneal lymphadenopathy is seen. No pelvic sidewall lymphadenopathy is identified. Reproductive: The bladder is mildly distended and grossly unremarkable. The uterus is unremarkable in appearance. The ovaries are relatively symmetric. No suspicious adnexal masses are seen. Other: No additional soft tissue abnormalities are seen. Musculoskeletal: No acute osseous abnormalities are identified. The visualized musculature is unremarkable in appearance. IMPRESSION: 1. No acute abnormality seen within the abdomen or pelvis. 2. Splenomegaly noted. Electronically Signed   By: Roanna RaiderJeffery  Chang  M.D.   On: 06/21/2018 00:13    Procedures Procedures (including critical care time)  Medications Ordered in ED Medications  morphine 4 MG/ML injection 4 mg (4 mg Intravenous Given 06/20/18 2316)  ondansetron (ZOFRAN) injection 4 mg (4 mg Intravenous Given 06/20/18 2316)  iopamidol (ISOVUE-300) 61 % injection 100 mL (100 mLs Intravenous Contrast Given 06/20/18 2346)     Initial Impression / Assessment and Plan / ED Course  I have reviewed the triage vital signs and the nursing notes.  Pertinent labs & imaging results that were available during my care of the patient were reviewed by me and considered in my medical decision making (see chart for details).     Pt with acute on chronic abdominal pain with CT scan ruling out acute appendicitis or other RLQ pathology. No obvious ovarian abnormality on CT imaging as well, although pt has history of ovarian cysts in the past, she was advised she may need to f/u with gyn if sx persist. Ovaries well visualized on Ct imaging and symmetric, doubt torsion as source of her pain.   She is actively a pt of Dr. Darrick PennaFields, advised f/u with her re splenomegaly.  She will call for appt in am.    Final Clinical Impressions(s) / ED Diagnoses   Final diagnoses:  Right lower quadrant abdominal pain  Splenomegaly    ED Discharge Orders        Ordered    ondansetron (ZOFRAN ODT) 4 MG disintegrating tablet  Every 8 hours PRN     06/21/18 0136       Burgess Amordol, Elaysha Bevard, PA-C 06/21/18 1440    Dione BoozeGlick, David, MD 06/21/18 2245

## 2018-06-21 NOTE — Discharge Instructions (Addendum)
As discussed, call Dr Darrick PennaFields for further evaluation of your enlarged spleen.  I have prescribed zofran to help with nausea. I cannot prescribe any stronger pain medicine than the oxycodone you currently take.

## 2018-06-23 ENCOUNTER — Ambulatory Visit: Payer: Self-pay | Admitting: Gastroenterology

## 2018-07-12 ENCOUNTER — Other Ambulatory Visit: Payer: Self-pay | Admitting: Adult Health

## 2018-07-12 ENCOUNTER — Ambulatory Visit (HOSPITAL_COMMUNITY): Payer: Self-pay | Admitting: Psychiatry

## 2018-08-09 ENCOUNTER — Telehealth (HOSPITAL_COMMUNITY): Payer: Self-pay | Admitting: *Deleted

## 2018-08-09 NOTE — Telephone Encounter (Signed)
Opened  Chart x 2 --- Mistakenly  

## 2018-08-09 NOTE — Telephone Encounter (Signed)
Dr Tenny Crawoss Patient [name] mom called & has considered the increase for the medication

## 2018-08-09 NOTE — Telephone Encounter (Signed)
Opened  Chart x 2 --- [name]

## 2018-08-18 ENCOUNTER — Ambulatory Visit (HOSPITAL_COMMUNITY): Payer: Medicaid Other | Admitting: Psychiatry

## 2018-08-24 ENCOUNTER — Ambulatory Visit (HOSPITAL_COMMUNITY): Payer: Medicaid Other | Admitting: Psychiatry

## 2018-08-31 ENCOUNTER — Other Ambulatory Visit (HOSPITAL_COMMUNITY)
Admission: RE | Admit: 2018-08-31 | Discharge: 2018-08-31 | Disposition: A | Discharge status: Home / Self Care | Payer: Medicaid Other | Source: Ambulatory Visit | Attending: Adult Health | Admitting: Adult Health

## 2018-08-31 ENCOUNTER — Ambulatory Visit (INDEPENDENT_AMBULATORY_CARE_PROVIDER_SITE_OTHER): Payer: Medicaid Other | Admitting: Adult Health

## 2018-08-31 ENCOUNTER — Encounter: Payer: Self-pay | Admitting: Adult Health

## 2018-08-31 VITALS — BP 122/88 | HR 83 | Ht 68.25 in | Wt 328.2 lb

## 2018-08-31 DIAGNOSIS — Z0000 Encounter for general adult medical examination without abnormal findings: Secondary | ICD-10-CM | POA: Diagnosis not present

## 2018-08-31 DIAGNOSIS — Z124 Encounter for screening for malignant neoplasm of cervix: Secondary | ICD-10-CM | POA: Insufficient documentation

## 2018-08-31 DIAGNOSIS — Z3041 Encounter for surveillance of contraceptive pills: Secondary | ICD-10-CM

## 2018-08-31 DIAGNOSIS — Z01419 Encounter for gynecological examination (general) (routine) without abnormal findings: Secondary | ICD-10-CM

## 2018-08-31 MED ORDER — NORETHIN ACE-ETH ESTRAD-FE 1-20 MG-MCG(24) PO CAPS: 1.0000 | ORAL_CAPSULE | Freq: Every day | ORAL | 12 refills | Status: DC

## 2018-08-31 MED ORDER — VALACYCLOVIR HCL 1 G PO TABS: 1000.0000 mg | ORAL_TABLET | Freq: Every day | ORAL | 11 refills | Status: DC

## 2018-08-31 NOTE — Progress Notes (Signed)
Patient ID: [name], female   DOB: 1986-02-25, 32 y.o.   MRN: [id number] History of Present Illness: [name] is a 32 year old white female, G1P1,separated, in for a well woman gyn exam and she requests pap. PCP is Dr Selena Batten at [facility] clinic.    Current Medications, Allergies, Past Medical History, Past Surgical History, Family History and Social History were reviewed in [employer] record.     Review of Systems: Patient denies any headaches, hearing loss, fatigue, blurred vision, shortness of breath, chest pain, abdominal pain, problems with bowel movements, urination, or intercourse. No joint pain or mood swings.    Physical Exam:BP 122/88 (BP Location: Left Arm, Patient Position: Sitting, Cuff Size: Normal)   Pulse 83   Ht 5' 8.25" (1.734 m)   Wt (!) 328 lb 3.2 oz (148.9 kg)   LMP 08/26/2018   BMI 49.54 kg/m  General:  Well developed, well nourished, no acute distress Skin:  Warm and dry Neck:  Midline trachea, normal thyroid, good ROM, no lymphadenopathy Lungs; Clear to auscultation bilaterally Breast:  No dominant palpable mass, retraction, or nipple discharge Cardiovascular: Regular rate and rhythm Abdomen:  Soft, non tender, no hepatosplenomegaly Pelvic:  External genitalia is normal in appearance, no lesions.  The vagina is normal in appearance. Urethra has no lesions or masses. The cervix is bulbous. Pap with HPV and GC/CHL performed. Uterus is felt to be normal size, shape, and contour.  No adnexal masses or tenderness noted.Bladder is non tender, no masses felt. Extremities/musculoskeletal:  No swelling or varicosities noted, no clubbing or cyanosis Psych:  No mood changes, alert and cooperative,seems happy PHQ 2 score 0. Examination chaperoned by Malachy Mood LPN.  Impression: 1. Encounter for gynecological examination with Papanicolaou smear of cervix   2. Routine cervical smear   3. Morbid obesity (HCC)   4. Encounter for surveillance of  contraceptive pills       Plan: Meds ordered this encounter  Medications  . valACYclovir (VALTREX) 1000 MG tablet    Sig: Take 1 tablet (1,000 mg total) by mouth daily.    Dispense:  30 tablet    Refill:  11    Order Specific Question:   Supervising Provider    Answer:   Despina Hidden, LUTHER H [[id number]]  . Norethin Ace-Eth Estrad-FE (TAYTULLA) 1-20 MG-MCG(24) CAPS    Sig: Take 1 tablet by mouth daily.    Dispense:  28 capsule    Refill:  12    Order Specific Question:   Supervising Provider    Answer:   Lazaro Arms [[id number]]  Physical in 1 year Pap in 3 if normal Labs with PCP Keep working on weight loss

## 2018-09-01 ENCOUNTER — Ambulatory Visit (HOSPITAL_COMMUNITY): Payer: Medicaid Other | Admitting: Psychiatry

## 2018-09-01 LAB — CYTOLOGY - PAP
Chlamydia: NEGATIVE
Diagnosis: NEGATIVE
HPV (WINDOPATH): NOT DETECTED
Neisseria Gonorrhea: NEGATIVE

## 2018-09-28 ENCOUNTER — Telehealth: Payer: Self-pay | Admitting: Nurse Practitioner

## 2018-09-28 ENCOUNTER — Encounter: Payer: Self-pay | Admitting: Gastroenterology

## 2018-09-28 ENCOUNTER — Ambulatory Visit: Payer: Medicaid Other | Admitting: Nurse Practitioner

## 2018-09-28 NOTE — Telephone Encounter (Signed)
PATIENT WAS A NO SHOW AND LETTER SENT  °

## 2018-09-28 NOTE — Progress Notes (Deleted)
Referring Provider: Pearson Grippe, MD Primary Care Physician:  Pearson Grippe, MD Primary GI:  Dr. Darrick Penna  No chief complaint on file.   HPI:   [name] is a 32 y.o. female who presents for right-sided pain.  The patient was last seen in our office 09/09/2017 for GERD, constipation, fatty liver, nausea and vomiting, generalized abdominal pain.  EGD on file 2017 suggestive of uncontrolled GERD and recommended twice daily Protonix after procedure.  General GERD educational information was provided as well.  Abdominal ultrasound recently completed found splenomegaly, hepatic steatosis without focal lesions, otherwise normal.  At her last visit her GERD was well controlled on PPI.  No constipation.  Still with abdominal pain in the upper abdomen to 3 times a week which last to 3 hours.  Pain described as stabbing, no NSAIDs or aspirin powders.  Some nausea but Phenergan helps.  Bowel movement daily with complete emptying and Bristol 4 stools.  No other GI symptoms.  Recommended HIDA scan, follow-up in 6 weeks, reactivate referral to weight loss clinic and/or bariatric surgery center.  HIDA scan was completed 09/14/2017 which found normal gallbladder ejection fraction.  The patient has not been seen since 2018 despite recommendation for 6-week follow-up.  She was in the emergency department 06/20/2018 for right lower quadrant pain.  She presented complaining of chronic abdominal pain in the upper abdomen with nausea but no vomiting, reduced appetite, radiation of pain to the right lower quadrant.  Pain is worsened with movement and better at rest.  Labs revealed mild leukocytosis with white blood cell count of 11.6.  CT scan completed during her visit found no acute abnormality, noted splenomegaly.  Noted history of ovarian cyst.  Recommended follow-up with gynecology if persistent symptoms.  Recommended GI follow-up related to splenomegaly.  Today she states   Past Medical History:  Diagnosis Date    . Abdominal pain, chronic, epigastric   . Abdominal pain, chronic, right lower quadrant   . Anxiety   . Bulging disc   . Constipation   . Contraceptive management 07/18/2014  . Depression   . Disk prolapse   . Elevated liver enzymes 12/01/2013  . Fatty liver 07/23/2017  . Gastritis   . GERD (gastroesophageal reflux disease)   . Headache   . Herpes   . Mental disorder    anxiety  . Obesity   . Other and unspecified ovarian cyst 12/01/2013  . Ovarian cyst, right   . Splenomegaly 07/23/2017    Past Surgical History:  Procedure Laterality Date  . COLONOSCOPY WITH ESOPHAGOGASTRODUODENOSCOPY (EGD) N/A 02/24/2013   [id number] RECTAL BLEEDING DUE TO Moderate sized internal hemorrhoids, EGD: erosive esophagitis due to uncontrolled reflux, moderate erosive gastritis, benign path  . ESOPHAGOGASTRODUODENOSCOPY  01/19/2012   BJY:NWGNFA,OZHYQMVH IN THE ANTRUM/HIATAL HERNIA/  . ESOPHAGOGASTRODUODENOSCOPY N/A 07/31/2016   Procedure: ESOPHAGOGASTRODUODENOSCOPY (EGD);  Surgeon: West Bali, MD;  Location: [facility];  Service: Endoscopy;  Laterality: N/A;  10:15 AM  . WISDOM TOOTH EXTRACTION      Current Outpatient Medications  Medication Sig Dispense Refill  . clonazePAM (KLONOPIN) 0.5 MG tablet Take 1 tablet (0.5 mg total) by mouth 2 (two) times daily as needed for anxiety. 60 tablet 2  . DULoxetine (CYMBALTA) 60 MG capsule Take 60 mg by mouth at bedtime.    Marland Kitchen FIBER, CORN DEXTRIN, PO Take 1 tablet by mouth as needed.     . Lactobacillus (ACIDOPHILUS PROBIOTIC) 10 MG TABS Take 10 mg by mouth 3 (three)  times daily. 30 tablet 0  . linaclotide (LINZESS) 145 MCG CAPS capsule Take 145 mcg by mouth daily before breakfast.    . LYRICA 150 MG capsule Take 150 mg by mouth 2 (two) times daily.  2  . Melatonin 5 MG TABS Take 1 tablet by mouth at bedtime.    . Norethin Ace-Eth Estrad-FE (TAYTULLA) 1-20 MG-MCG(24) CAPS Take 1 tablet by mouth daily. 28 capsule 12  . ondansetron (ZOFRAN ODT) 4 MG  disintegrating tablet Take 1 tablet (4 mg total) by mouth every 8 (eight) hours as needed for nausea or vomiting. 20 tablet 0  . pantoprazole (PROTONIX) 40 MG tablet TAKE 1 TABLET (40 MG TOTAL) BY MOUTH 2 (TWO) TIMES DAILY BEFORE A MEAL. 60 tablet 10  . pravastatin (PRAVACHOL) 40 MG tablet Take 40 mg by mouth at bedtime.  0  . tiZANidine (ZANAFLEX) 2 MG tablet Take 2 mg by mouth every 8 (eight) hours as needed for muscle spasms.   2  . traMADol (ULTRAM) 50 MG tablet Take 1 tablet (50 mg total) by mouth every 6 (six) hours as needed. (Patient taking differently: Take 50 mg by mouth every 6 (six) hours as needed for moderate pain. ) 15 tablet 0  . traZODone (DESYREL) 100 MG tablet Take 100 mg by mouth at bedtime.    . valACYclovir (VALTREX) 1000 MG tablet Take 1 tablet (1,000 mg total) by mouth daily. 30 tablet 11   No current facility-administered medications for this visit.     Allergies as of 09/28/2018 - Review Complete 08/31/2018  Allergen Reaction Noted  . Naproxen Other (See Comments) 05/05/2016    Family History  Problem Relation Age of Onset  . GER disease Mother   . Other Mother        diverticulitis; hernia  . Diabetes Mother        borderline  . Anxiety disorder Mother   . Depression Mother   . Diabetes Maternal Grandmother   . COPD Maternal Grandmother   . Diabetes Maternal Grandfather   . Congestive Heart Failure Maternal Grandfather   . Headache Son   . ADD / ADHD Son   . Diabetes Other   . Colon cancer Neg Hx   . Colon polyps Neg Hx   . Gastric cancer Neg Hx   . Esophageal cancer Neg Hx     Social History   Socioeconomic History  . Marital status: Married    Spouse name: Not on file  . Number of children: 1  . Years of education: Not on file  . Highest education level: Not on file  Occupational History    Employer: [employer]  . Occupation: Unemployed  Social Needs  . Financial resource strain: Not hard at all  . Food insecurity:    Worry: Never true      Inability: Never true  . Transportation needs:    Medical: No    Non-medical: No  Tobacco Use  . Smoking status: Former Smoker    Packs/day: 0.00    Years: 0.50    Pack years: 0.00    Types: Cigarettes    Last attempt to quit: 07/28/2011    Years since quitting: 7.1  . Smokeless tobacco: Never Used  . Tobacco comment: stopped in 2009  Substance and Sexual Activity  . Alcohol use: No  . Drug use: No  . Sexual activity: Yes    Birth control/protection: Pill  Lifestyle  . Physical activity:    Days per week: Not  on file    Minutes per session: Not on file  . Stress: Not on file  Relationships  . Social connections:    Talks on phone: Not on file    Gets together: Not on file    Attends religious service: Not on file    Active member of club or organization: Not on file    Attends meetings of clubs or organizations: Not on file    Relationship status: Not on file  Other Topics Concern  . Not on file  Social History Narrative  . Not on file    Review of Systems: General: Negative for anorexia, weight loss, fever, chills, fatigue, weakness. Eyes: Negative for vision changes.  ENT: Negative for hoarseness, difficulty swallowing , nasal congestion. CV: Negative for chest pain, angina, palpitations, dyspnea on exertion, peripheral edema.  Respiratory: Negative for dyspnea at rest, dyspnea on exertion, cough, sputum, wheezing.  GI: See history of present illness. GU:  Negative for dysuria, hematuria, urinary incontinence, urinary frequency, nocturnal urination.  MS: Negative for joint pain, low back pain.  Derm: Negative for rash or itching.  Neuro: Negative for weakness, abnormal sensation, seizure, frequent headaches, memory loss, confusion.  Psych: Negative for anxiety, depression, suicidal ideation, hallucinations.  Endo: Negative for unusual weight change.  Heme: Negative for bruising or bleeding. Allergy: Negative for rash or hives.   Physical Exam: There were  no vitals taken for this visit. General:   Alert and oriented. Pleasant and cooperative. Well-nourished and well-developed.  Head:  Normocephalic and atraumatic. Eyes:  Without icterus, sclera clear and conjunctiva pink.  Ears:  Normal auditory acuity. Mouth:  No deformity or lesions, oral mucosa pink.  Throat/Neck:  Supple, without mass or thyromegaly. Cardiovascular:  S1, S2 present without murmurs appreciated. Normal pulses noted. Extremities without clubbing or edema. Respiratory:  Clear to auscultation bilaterally. No wheezes, rales, or rhonchi. No distress.  Gastrointestinal:  +BS, soft, non-tender and non-distended. No HSM noted. No guarding or rebound. No masses appreciated.  Rectal:  Deferred  Musculoskalatal:  Symmetrical without gross deformities. Normal posture. Skin:  Intact without significant lesions or rashes. Neurologic:  Alert and oriented x4;  grossly normal neurologically. Psych:  Alert and cooperative. Normal mood and affect. Heme/Lymph/Immune: No significant cervical adenopathy. No excessive bruising noted.    09/28/2018 9:27 AM   Disclaimer: This note was dictated with voice recognition software. Similar sounding words can inadvertently be transcribed and may not be corrected upon review.

## 2018-10-03 ENCOUNTER — Ambulatory Visit (HOSPITAL_COMMUNITY): Payer: Self-pay | Admitting: Psychiatry

## 2018-11-20 ENCOUNTER — Other Ambulatory Visit: Payer: Self-pay | Admitting: Gastroenterology

## 2018-11-20 DIAGNOSIS — K2950 Unspecified chronic gastritis without bleeding: Secondary | ICD-10-CM

## 2018-11-20 DIAGNOSIS — K219 Gastro-esophageal reflux disease without esophagitis: Secondary | ICD-10-CM

## 2019-01-26 ENCOUNTER — Telehealth: Payer: Self-pay

## 2019-01-26 NOTE — Telephone Encounter (Signed)
T/C from Alyssa at the [facility] requesting records on this pt. She wasn't sure if they referred here at some point, but I didn't see a referral. I told her that the Medical Records person is out of the office and I will send a note to her. Their call back number is [phone]. Fax # [phone].  Forwarding to [place] also since Darl Pikes is off on Friday.

## 2019-01-31 NOTE — Telephone Encounter (Signed)
I called the [facility] to find out which records they were requesting. Alyssa is to call me back.

## 2019-02-14 ENCOUNTER — Telehealth: Payer: Self-pay | Admitting: Gastroenterology

## 2019-02-14 NOTE — Telephone Encounter (Signed)
She can have [facility] send [place] a request

## 2019-02-14 NOTE — Telephone Encounter (Signed)
Patient called inquiring how to transfer records, said she is going to [facility] from now on.  I cancelled her upcoming appointment.

## 2019-02-14 NOTE — Telephone Encounter (Signed)
PT TRANSFERRING CARE TO [facility]. REVIEWED-NO ADDITIONAL RECOMMENDATIONS.

## 2019-03-09 ENCOUNTER — Ambulatory Visit: Payer: Self-pay | Admitting: Nurse Practitioner

## 2019-04-10 ENCOUNTER — Other Ambulatory Visit: Payer: Self-pay

## 2019-04-10 ENCOUNTER — Emergency Department (HOSPITAL_COMMUNITY)
Admission: EM | Admit: 2019-04-10 | Discharge: 2019-04-10 | Disposition: A | Discharge status: Home / Self Care | Payer: Medicaid Other | Attending: Emergency Medicine | Admitting: Emergency Medicine

## 2019-04-10 ENCOUNTER — Encounter (HOSPITAL_COMMUNITY): Payer: Self-pay | Admitting: *Deleted

## 2019-04-10 DIAGNOSIS — T7840XA Allergy, unspecified, initial encounter: Secondary | ICD-10-CM

## 2019-04-10 DIAGNOSIS — R21 Rash and other nonspecific skin eruption: Secondary | ICD-10-CM | POA: Diagnosis not present

## 2019-04-10 DIAGNOSIS — Z87891 Personal history of nicotine dependence: Secondary | ICD-10-CM | POA: Diagnosis not present

## 2019-04-10 MED ORDER — DEXAMETHASONE SODIUM PHOSPHATE 10 MG/ML IJ SOLN
10.0000 mg | Freq: Once | INTRAMUSCULAR | Status: AC
  Administered 2019-04-10: 10 mg via INTRAMUSCULAR
  Filled 2019-04-10: qty 1

## 2019-04-10 NOTE — Discharge Instructions (Addendum)
You were evaluated in the Emergency Department and after careful evaluation, we did not find any emergent condition requiring admission or further testing in the hospital.  Your symptoms today seem to be due to a possible allergic reaction.  Please stop the new medication and discuss your symptoms with your regular doctor.  Please return to the Emergency Department if you experience any worsening of your condition.  We encourage you to follow up with a primary care provider.  Thank you for allowing [place] to be a part of your care.

## 2019-04-10 NOTE — ED Triage Notes (Signed)
Pt with itching due to taking hydroxyzine since last night, medication for anxiety.

## 2019-04-10 NOTE — ED Provider Notes (Signed)
[facility] Emergency Department Provider Note MRN:  [id number]  Arrival date & time: 04/10/19     Chief Complaint   Allergic Reaction   History of Present Illness   [name] is a 33 y.o. year-old female with a history of depression, obesity, mental disorder presenting to the ED with chief complaint of allergic reaction.  Patient explains that she took her first dose of a new anxiety medication called hydroxyzine last night, and shortly after began developing a rash to her arms, very itchy all over.  Has tried Benadryl with minimal relief.  Denies fever, no cough, no shortness of breath, no sensation of throat closure, no vomiting or diarrhea, no abdominal pain, no other symptoms.  Denies any new soaps or detergents or any other new exposures.  Review of Systems  A complete 10 system review of systems was obtained and all systems are negative except as noted in the HPI and PMH.   Patient's Health History    Past Medical History:  Diagnosis Date  . Abdominal pain, chronic, epigastric   . Abdominal pain, chronic, right lower quadrant   . Anxiety   . Bulging disc   . Constipation   . Contraceptive management 07/18/2014  . Depression   . Disk prolapse   . Elevated liver enzymes 12/01/2013  . Fatty liver 07/23/2017  . Gastritis   . GERD (gastroesophageal reflux disease)   . Headache   . Herpes   . Mental disorder    anxiety  . Obesity   . Other and unspecified ovarian cyst 12/01/2013  . Ovarian cyst, right   . Splenomegaly 07/23/2017    Past Surgical History:  Procedure Laterality Date  . COLONOSCOPY WITH ESOPHAGOGASTRODUODENOSCOPY (EGD) N/A 02/24/2013   [id number]:[id number] RECTAL BLEEDING DUE TO Moderate sized internal hemorrhoids, EGD: erosive esophagitis due to uncontrolled reflux, moderate erosive gastritis, benign path  . ESOPHAGOGASTRODUODENOSCOPY  01/19/2012   [id number],WNUUVOZD IN THE ANTRUM/HIATAL HERNIA/  . ESOPHAGOGASTRODUODENOSCOPY N/A 07/31/2016   Procedure: ESOPHAGOGASTRODUODENOSCOPY (EGD);  Surgeon: West Bali, MD;  Location: [facility];  Service: Endoscopy;  Laterality: N/A;  10:15 AM  . WISDOM TOOTH EXTRACTION      Family History  Problem Relation Age of Onset  . GER disease Mother   . Other Mother        diverticulitis; hernia  . Diabetes Mother        borderline  . Anxiety disorder Mother   . Depression Mother   . Diabetes Maternal Grandmother   . COPD Maternal Grandmother   . Diabetes Maternal Grandfather   . Congestive Heart Failure Maternal Grandfather   . Headache Son   . ADD / ADHD Son   . Diabetes Other   . Colon cancer Neg Hx   . Colon polyps Neg Hx   . Gastric cancer Neg Hx   . Esophageal cancer Neg Hx     Social History   Socioeconomic History  . Marital status: Married    Spouse name: Not on file  . Number of children: 1  . Years of education: Not on file  . Highest education level: Not on file  Occupational History    Employer: [employer]  . Occupation: Unemployed  Social Needs  . Financial resource strain: Not hard at all  . Food insecurity:    Worry: Never true    Inability: Never true  . Transportation needs:    Medical: No    Non-medical: No  Tobacco Use  . Smoking  status: Former Smoker    Packs/day: 0.00    Years: 0.50    Pack years: 0.00    Types: Cigarettes    Last attempt to quit: 07/28/2011    Years since quitting: 7.7  . Smokeless tobacco: Never Used  . Tobacco comment: stopped in 2009  Substance and Sexual Activity  . Alcohol use: No  . Drug use: No  . Sexual activity: Yes    Birth control/protection: Pill  Lifestyle  . Physical activity:    Days per week: Not on file    Minutes per session: Not on file  . Stress: Not on file  Relationships  . Social connections:    Talks on phone: Not on file    Gets together: Not on file    Attends religious service: Not on file    Active member of club or organization: Not on file    Attends meetings of clubs or  organizations: Not on file    Relationship status: Not on file  . Intimate partner violence:    Fear of current or ex partner: Not on file    Emotionally abused: Not on file    Physically abused: Not on file    Forced sexual activity: Not on file  Other Topics Concern  . Not on file  Social History Narrative  . Not on file     Physical Exam  Vital Signs and Nursing Notes reviewed Vitals:   04/10/19 1503  BP: 115/73  Pulse: 85  Resp: 20  Temp: 98.3 F (36.8 C)  SpO2: 100%    CONSTITUTIONAL: Well-appearing, NAD NEURO:  Alert and oriented x 3, no focal deficits EYES:  eyes equal and reactive ENT/NECK:  no LAD, no JVD CARDIO: Regular rate, well-perfused, normal S1 and S2 PULM:  CTAB no wheezing or rhonchi GI/GU:  normal bowel sounds, non-distended, non-tender MSK/SPINE:  No gross deformities, no edema SKIN: Faint erythematous macular rash to the bilateral upper arms PSYCH:  Appropriate speech and behavior  Diagnostic and Interventional Summary    Labs Reviewed - No data to display  No orders to display    Medications  dexamethasone (DECADRON) injection 10 mg (10 mg Intramuscular Given 04/10/19 1517)     Procedures Critical Care  ED Course and Medical Decision Making  I have reviewed the triage vital signs and the nursing notes.  Pertinent labs & imaging results that were available during my care of the patient were reviewed by me and considered in my medical decision making (see below for details).  Allergic reaction to hydroxyzine would be considered very unlikely given that it is an antihistamine, but patient is unaware of any other new exposures.  Has tried Benadryl without significant relief of itching.  Will provide single dose Decadron, advised PCP follow-up.  Patient is without any shortness of breath, no wheezing, no airway concerns, no reason for further testing or observation here in the emergency department.  After the discussed management above, the  patient was determined to be safe for discharge.  The patient was in agreement with this plan and all questions regarding their care were answered.  ED return precautions were discussed and the patient will return to the ED with any significant worsening of condition.  Elmer Sow. Pilar Plate, MD [facility] Emergency Medicine [facility] [email or web address]  Final Clinical Impressions(s) / ED Diagnoses     ICD-10-CM   1. Allergic reaction, initial encounter T78.40XA     ED Discharge Orders    None  Sabas SousBero, Carollynn Pennywell M, MD 04/10/19 [id number]

## 2019-06-13 ENCOUNTER — Other Ambulatory Visit: Payer: Self-pay | Admitting: Adult Health

## 2019-06-13 DIAGNOSIS — R232 Flushing: Secondary | ICD-10-CM

## 2019-06-13 NOTE — Progress Notes (Signed)
Ck TSH and free T4 with CMP

## 2019-06-16 LAB — COMPREHENSIVE METABOLIC PANEL
ALT: 11 IU/L (ref 0–32)
AST: 25 IU/L (ref 0–40)
Albumin/Globulin Ratio: 1.6 (ref 1.2–2.2)
Albumin: 4.3 g/dL (ref 3.9–5.0)
Alkaline Phosphatase: 106 IU/L (ref 39–117)
BUN/Creatinine Ratio: 12 (ref 9–23)
BUN: 12 mg/dL (ref 6–20)
Bilirubin Total: 0.5 mg/dL (ref 0.0–1.2)
CO2: 19 mmol/L — ABNORMAL LOW (ref 20–29)
Calcium: 9.4 mg/dL (ref 8.7–10.2)
Chloride: 106 mmol/L (ref 96–106)
Creatinine, Ser: 0.97 mg/dL (ref 0.57–1.00)
GFR calc Af Amer: 91 mL/min/{1.73_m2} (ref >59)
GFR calc non Af Amer: 79 mL/min/{1.73_m2} (ref >59)
Globulin, Total: 2.7 g/dL (ref 1.5–4.5)
Glucose: 78 mg/dL (ref 65–99)
Potassium: 4.2 mmol/L (ref 3.5–5.2)
Sodium: 141 mmol/L (ref 134–144)
Total Protein: 7 g/dL (ref 6.0–8.5)

## 2019-06-16 LAB — TSH+FREE T4
Free T4: 1.1 ng/dL (ref 0.82–1.77)
TSH: 2.89 u[IU]/mL (ref 0.450–4.500)

## 2019-07-18 ENCOUNTER — Other Ambulatory Visit: Payer: Self-pay | Admitting: Adult Health

## 2019-07-18 ENCOUNTER — Other Ambulatory Visit: Payer: Self-pay | Admitting: Women's Health

## 2019-08-12 ENCOUNTER — Other Ambulatory Visit: Payer: Self-pay | Admitting: Women's Health

## 2019-08-17 ENCOUNTER — Other Ambulatory Visit: Payer: Self-pay

## 2019-08-17 ENCOUNTER — Emergency Department (HOSPITAL_COMMUNITY)
Admission: EM | Admit: 2019-08-17 | Discharge: 2019-08-17 | Disposition: A | Discharge status: Home / Self Care | Payer: Medicaid Other | Attending: Emergency Medicine | Admitting: Emergency Medicine

## 2019-08-17 ENCOUNTER — Encounter (HOSPITAL_COMMUNITY): Payer: Self-pay | Admitting: Emergency Medicine

## 2019-08-17 ENCOUNTER — Other Ambulatory Visit: Payer: Self-pay | Admitting: Adult Health

## 2019-08-17 DIAGNOSIS — M25562 Pain in left knee: Secondary | ICD-10-CM | POA: Diagnosis not present

## 2019-08-17 DIAGNOSIS — M25561 Pain in right knee: Secondary | ICD-10-CM | POA: Insufficient documentation

## 2019-08-17 DIAGNOSIS — Z5321 Procedure and treatment not carried out due to patient leaving prior to being seen by health care provider: Secondary | ICD-10-CM | POA: Insufficient documentation

## 2019-08-17 MED ORDER — VALACYCLOVIR HCL 1 G PO TABS: 1000.0000 mg | ORAL_TABLET | Freq: Every day | ORAL | 12 refills | Status: DC

## 2019-08-17 NOTE — ED Notes (Signed)
No answer in waiting room X2,  

## 2019-08-17 NOTE — ED Notes (Signed)
No answer in waiting areas x 3rd attempt

## 2019-08-17 NOTE — Progress Notes (Signed)
Refilled valtrex  

## 2019-08-17 NOTE — ED Triage Notes (Signed)
Pt tripped and fell in a parking lot. Scraped both knee and wrists.  Pt c/o pain with walking and sitting

## 2019-10-06 ENCOUNTER — Other Ambulatory Visit: Payer: Self-pay | Admitting: Adult Health

## 2019-10-06 MED ORDER — TAYTULLA 1-20 MG-MCG(24) PO CAPS: 1.0000 | ORAL_CAPSULE | Freq: Every day | ORAL | 12 refills | Status: DC

## 2019-10-06 NOTE — Progress Notes (Signed)
Refilled taytulla 

## 2019-10-16 ENCOUNTER — Other Ambulatory Visit: Payer: Self-pay

## 2019-10-16 DIAGNOSIS — K219 Gastro-esophageal reflux disease without esophagitis: Secondary | ICD-10-CM

## 2019-10-16 DIAGNOSIS — K2950 Unspecified chronic gastritis without bleeding: Secondary | ICD-10-CM

## 2019-10-27 ENCOUNTER — Encounter: Payer: Self-pay | Admitting: Adult Health

## 2019-10-27 ENCOUNTER — Ambulatory Visit (INDEPENDENT_AMBULATORY_CARE_PROVIDER_SITE_OTHER): Payer: Medicaid Other | Admitting: Adult Health

## 2019-10-27 ENCOUNTER — Other Ambulatory Visit: Payer: Self-pay

## 2019-10-27 VITALS — BP 126/87 | HR 96 | Ht 66.0 in | Wt 329.0 lb

## 2019-10-27 DIAGNOSIS — M25551 Pain in right hip: Secondary | ICD-10-CM

## 2019-10-27 DIAGNOSIS — R1031 Right lower quadrant pain: Secondary | ICD-10-CM | POA: Diagnosis not present

## 2019-10-27 MED ORDER — MELOXICAM 15 MG PO TABS: 15.0000 mg | ORAL_TABLET | Freq: Every day | ORAL | 0 refills | Status: DC

## 2019-10-27 NOTE — Progress Notes (Signed)
  Subjective:     Patient ID: [name], female   DOB: Jan 14, 1986, 33 y.o.   MRN: [id number]  HPI [name] is a 33 year old white female, married, G1P1 in complaining of right side for about 1.5 weeks now.Has been seen at [facility].  PCP is [facility]   Review of Systems Pain in right side since about 11/30, seen in ER at [facility] 12/2 told to see orthopedic and then talked with PCP who told her to come see me for possible ovary cyst She is on Taytulla  Pain Radiates to back, and helps to lay on left side Tramadol and tylenol not helping  Some pain with sex  Reviewed past medical,surgical, social and family history. Reviewed medications and allergies.     Objective:   Physical Exam BP 126/87 (BP Location: Left Arm, Patient Position: Sitting, Cuff Size: Normal)   Pulse 96   Ht _0  (1.676 m)   Wt (!) 329 lb (149.2 kg)   LMP 10/20/2019   BMI 53.10 kg/m  Skin warm and dry.  Lungs: clear to ausculation bilaterally. Cardiovascular: regular rate and rhythm. .Pelvic: external genitalia is normal in appearance no lesions, vagina: pink with good moisture,urethra has no lesions or masses noted, cervix:smooth and bulbous, No CMT,uterus: normal size, shape and contour, non tender, no masses felt, adnexa: no masses, RLQ tenderness noted. Bladder is non tender and no masses felt. No rebound tenderness.  +tenderness right hip bursa, negative straight leg raise, negative heel jar, negative rovsing    Fall risk is low Co exam with Weyman Croon FNP student.   Assessment:     1. RLQ abdominal pain Check CBC and ESR Get GYN [place] in 4 days  2. Pain in joint of right hip Meds ordered this encounter  Medications  . meloxicam (MOBIC) 15 MG tablet    Sig: Take 1 tablet (15 mg total) by mouth daily.    Dispense:  20 tablet    Refill:  0    Order Specific Question:   Supervising Provider    Answer:   Tania Ade H [[id number]]  Alternate ice and heat     Plan:    Increase  fluids  If pain increases, fever, nausea or vomiting go to ER

## 2019-10-28 LAB — CBC
Hematocrit: 44 % (ref 34.0–46.6)
Hemoglobin: 14.7 g/dL (ref 11.1–15.9)
MCH: 31.3 pg (ref 26.6–33.0)
MCHC: 33.4 g/dL (ref 31.5–35.7)
MCV: 94 fL (ref 79–97)
Platelets: 255 10*3/uL (ref 150–450)
RBC: 4.7 x10E6/uL (ref 3.77–5.28)
RDW: 13.2 % (ref 11.7–15.4)
WBC: 8.7 10*3/uL (ref 3.4–10.8)

## 2019-10-28 LAB — SEDIMENTATION RATE: Sed Rate: 83 mm/hr — ABNORMAL HIGH (ref 0–32)

## 2019-10-31 ENCOUNTER — Other Ambulatory Visit: Payer: Self-pay

## 2019-10-31 ENCOUNTER — Ambulatory Visit (INDEPENDENT_AMBULATORY_CARE_PROVIDER_SITE_OTHER): Payer: Medicaid Other

## 2019-10-31 DIAGNOSIS — R1031 Right lower quadrant pain: Secondary | ICD-10-CM | POA: Diagnosis not present

## 2019-10-31 NOTE — Progress Notes (Signed)
PELVIC US TA/TV: homogeneous retroflexed uterus,wnl,EEC 3.2 mm,mult small simple nabothian cysts,normal left ovary,simple dominate follicle right ovary 2.4 x 1.9 x 1.6 cm,no free fluid,right adnexal pain during ultrasound,ovaries appear mobile

## 2019-11-08 ENCOUNTER — Other Ambulatory Visit: Payer: Self-pay

## 2019-11-08 ENCOUNTER — Encounter: Payer: Self-pay | Admitting: *Deleted

## 2019-11-08 ENCOUNTER — Ambulatory Visit (INDEPENDENT_AMBULATORY_CARE_PROVIDER_SITE_OTHER): Payer: Medicaid Other | Admitting: Obstetrics and Gynecology

## 2019-11-08 ENCOUNTER — Encounter: Payer: Self-pay | Admitting: Obstetrics and Gynecology

## 2019-11-08 VITALS — BP 138/90 | HR 91 | Ht 66.0 in | Wt 333.8 lb

## 2019-11-08 DIAGNOSIS — R1031 Right lower quadrant pain: Secondary | ICD-10-CM

## 2019-11-08 DIAGNOSIS — N888 Other specified noninflammatory disorders of cervix uteri: Secondary | ICD-10-CM | POA: Diagnosis not present

## 2019-11-08 NOTE — Progress Notes (Signed)
Patient ID: [name], female   DOB: [birth year], 33 y.o.   MRN: [id number]    [facility] Clinic Visit  @DATE @            Patient name: [name] MRN [name]  Date of birth: 1986-06-30  CC & HPI:  [name] is a 33 y.o. female presenting today for f/u of nabothian cyst on ultrasound. She is still having pain on her right side. Pain is much worse and can barely sit without constantly having to move. Her pain is mainly in the groin area. She denies any urinary frequency. She has been sexually active since utrasound. She has noted some pain but is manageable. She says pain is up inside as if something is getting hit. The pain is similar to groin pain.  She had an x-ray at [facility] in early December of her right leg. She has managed the pain with NSAIDS. She has no nausea or vomiting. She has been taking taytulla but her and her husband would like another child. She has a bulging disk on her right side and is taking ultram prn from other provider  ROS:  ROS U/s 10/31/2019 showed: PELVIC 11/02/2019 TA/TV: homogeneous retroflexed uterus,wnl,EEC 3.2 mm,mult small simple nabothian cysts,normal left ovary, simple dominate follicle right ovary 2.4 x 1.9 x 1.6 cm,no free fluid,right adnexal pain during ultrasound,ovaries appear mobile  Pertinent History Reviewed:   Reviewed: Significant for  Medical         Past Medical History:  Diagnosis Date  . Abdominal pain, chronic, epigastric   . Abdominal pain, chronic, right lower quadrant   . Anxiety   . Bulging disc   . Constipation   . Contraceptive management 07/18/2014  . Depression   . Disk prolapse   . Elevated liver enzymes 12/01/2013  . Fatty liver 07/23/2017  . Gastritis   . GERD (gastroesophageal reflux disease)   . Headache   . Herpes   . Mental disorder    anxiety  . Obesity   . Other and unspecified ovarian cyst 12/01/2013  . Ovarian cyst, right   . Splenomegaly 07/23/2017                              Surgical Hx:    Past  Surgical History:  Procedure Laterality Date  . COLONOSCOPY WITH ESOPHAGOGASTRODUODENOSCOPY (EGD) N/A 02/24/2013   04/26/2013 RECTAL BLEEDING DUE TO Moderate sized internal hemorrhoids, EGD: erosive esophagitis due to uncontrolled reflux, moderate erosive gastritis, benign path  . ESOPHAGOGASTRODUODENOSCOPY  01/19/2012   03/20/2012 IN THE ANTRUM/HIATAL HERNIA/  . ESOPHAGOGASTRODUODENOSCOPY N/A 07/31/2016   Procedure: ESOPHAGOGASTRODUODENOSCOPY (EGD);  Surgeon: 08/02/2016, MD;  [facility];  Service: Endoscopy;  Laterality: N/A;  10:15 AM  . WISDOM TOOTH EXTRACTION     Medications: Reviewed & Updated - see associated section                       Current Outpatient Medications:  .  DULoxetine (CYMBALTA) 60 MG capsule, Take 60 mg by mouth at bedtime., Disp: , Rfl:  .  FIBER, CORN DEXTRIN, PO, Take 1 tablet by mouth as needed. , Disp: , Rfl:  .  Lactobacillus (ACIDOPHILUS PROBIOTIC) 10 MG TABS, Take 10 mg by mouth 3 (three) times daily., Disp: 30 tablet, Rfl: 0 .  linaclotide (LINZESS) 145 MCG CAPS capsule, Take 145 mcg by mouth daily before breakfast., Disp: ,  Rfl:  .  LYRICA 150 MG capsule, Take 150 mg by mouth 2 (two) times daily., Disp: , Rfl: 2 .  meloxicam (MOBIC) 15 MG tablet, Take 1 tablet (15 mg total) by mouth daily., Disp: 20 tablet, Rfl: 0 .  Norethin Ace-Eth Estrad-FE (TAYTULLA) 1-20 MG-MCG(24) CAPS, Take 1 tablet by mouth daily., Disp: 28 capsule, Rfl: 12 .  pantoprazole (PROTONIX) 40 MG tablet, TAKE 1 TABLET (40 MG TOTAL) BY MOUTH 2 (TWO) TIMES DAILY BEFORE A MEAL., Disp: 60 tablet, Rfl: 10 .  pravastatin (PRAVACHOL) 40 MG tablet, Take 40 mg by mouth at bedtime., Disp: , Rfl: 0 .  tiZANidine (ZANAFLEX) 2 MG tablet, Take 2 mg by mouth every 8 (eight) hours as needed for muscle spasms. , Disp: , Rfl: 2 .  traMADol (ULTRAM) 50 MG tablet, Take 1 tablet (50 mg total) by mouth every 6 (six) hours as needed. (Patient taking differently: Take 50 mg by mouth every  6 (six) hours as needed for moderate pain. ), Disp: 15 tablet, Rfl: 0 .  traZODone (DESYREL) 100 MG tablet, Take 100 mg by mouth at bedtime., Disp: , Rfl:  .  valACYclovir (VALTREX) 1000 MG tablet, Take 1 tablet (1,000 mg total) by mouth daily., Disp: 30 tablet, Rfl: 12   Social History: Reviewed -  reports that she quit smoking about 8 years ago. Her smoking use included cigarettes. She smoked 0.00 packs per day for 0.50 years. She has never used smokeless tobacco.  Objective Findings:  Vitals: Blood pressure 138/90, pulse 91, height 5\' 6"  (1.676 m), weight (!) 333 lb 12.8 oz (151.4 kg), last menstrual period 10/20/2019.  PHYSICAL EXAMINATION General appearance - alert, well appearing, and in no distress and overweight Mental status - normal mood, behavior, speech, dress, motor activity, and thought processes, affect appropriate to mood Abdomen- vague firmness in RLQ,, tenderness upon palpating area no guarding or rebound.  PELVIC Not done  Assessment & Plan:   A:  1.  2. Choronic RLQ pain  P:  1. CT abdomen and pelvis next week. 2. F/u in 3 weeks   By signing my name below, I, Samul Dada, attest that this documentation has been prepared under the direction and in the presence of Jonnie Kind, MD. Electronically Signed: [place]. 11/08/19. 10:17 AM.  I personally performed the services described in this documentation, which was SCRIBED in my presence. The recorded information has been reviewed and considered accurate. It has been edited as necessary during review. Jonnie Kind, MD

## 2019-11-23 ENCOUNTER — Other Ambulatory Visit: Payer: Self-pay

## 2019-11-23 ENCOUNTER — Ambulatory Visit (HOSPITAL_COMMUNITY)
Admission: RE | Admit: 2019-11-23 | Discharge: 2019-11-23 | Disposition: A | Discharge status: Home / Self Care | Payer: Medicaid Other | Source: Ambulatory Visit | Attending: Obstetrics and Gynecology | Admitting: Obstetrics and Gynecology

## 2019-11-23 DIAGNOSIS — R1031 Right lower quadrant pain: Secondary | ICD-10-CM | POA: Diagnosis present

## 2019-11-23 MED ORDER — IOHEXOL 300 MG/ML  SOLN
125.0000 mL | Freq: Once | INTRAMUSCULAR | Status: AC | PRN
  Administered 2019-11-23: 125 mL via INTRAVENOUS

## 2019-12-11 ENCOUNTER — Ambulatory Visit: Payer: Medicaid Other | Admitting: Obstetrics and Gynecology

## 2019-12-14 ENCOUNTER — Other Ambulatory Visit: Payer: Medicaid Other | Admitting: Adult Health

## 2019-12-19 ENCOUNTER — Other Ambulatory Visit: Payer: Self-pay

## 2019-12-19 ENCOUNTER — Ambulatory Visit (INDEPENDENT_AMBULATORY_CARE_PROVIDER_SITE_OTHER): Payer: Medicaid Other | Admitting: Adult Health

## 2019-12-19 ENCOUNTER — Encounter: Payer: Self-pay | Admitting: Adult Health

## 2019-12-19 VITALS — BP 123/86 | HR 84 | Ht 66.0 in | Wt 345.8 lb

## 2019-12-19 DIAGNOSIS — M545 Low back pain, unspecified: Secondary | ICD-10-CM

## 2019-12-19 DIAGNOSIS — Z8742 Personal history of other diseases of the female genital tract: Secondary | ICD-10-CM | POA: Diagnosis not present

## 2019-12-19 DIAGNOSIS — Z3202 Encounter for pregnancy test, result negative: Secondary | ICD-10-CM | POA: Diagnosis not present

## 2019-12-19 DIAGNOSIS — N939 Abnormal uterine and vaginal bleeding, unspecified: Secondary | ICD-10-CM | POA: Diagnosis not present

## 2019-12-19 LAB — POCT URINE PREGNANCY: Preg Test, Ur: NEGATIVE

## 2019-12-19 NOTE — Progress Notes (Addendum)
  Subjective:     Patient ID: [name], female   DOB: 03-31-1986, 34 y.o.   MRN: [id number]  HPI [name] is a 34 year old white female, married, G1P1 in complaining of having period 3 x in January, stopped OCs in December.Still has pain right side and back.She tried mobic without relief.  PCP is Dr Selena Batten.   Review of Systems Stopped OCs in December and had period 3 x in January, and is still spotting Has pain in right side and back, has not seen orthopedist yet, has trouble getting out of tub and off couch Sometimes has pain with sex on right side  Reviewed past medical,surgical, social and family history. Reviewed medications and allergies.     Objective:   Physical Exam BP 123/86 (BP Location: Left Arm, Patient Position: Sitting, Cuff Size: Large)   Pulse 84   Ht 5\' 6"  (1.676 m)   Wt (!) 345 lb 12.8 oz (156.9 kg)   BMI 55.81 kg/m UPT is negative. Skin warm and dry. Neck: mid line trachea, normal thyroid, good ROM, no lymphadenopathy noted. Lungs: clear to ausculation bilaterally. Cardiovascular: regular rate and rhythm. Pelvic: external genitalia is normal in appearance no lesions, vagina: no discharge, bleeding or odor.,urethra has no lesions or masses noted, cervix:smooth and bulbous, uterus: normal size, shape and contour, non tender, no masses felt, adnexa: no masses or tenderness noted. Bladder is non tender and no masses felt.   Has tenderness right hip bursa and with right leg rotation, and some pain low back with palpation. CT first of January showed bilateral functional cysts. Sed Rate was 32 on 10/27/19. Fall risk is low Examination chaperoned by 14/11/20, AN[employer] student, who assisted with exam. Assessment:     1. Pregnancy examination or test, negative result  2. Abnormal uterine bleeding (AUB) GYN Richelle Ito in 1 week  3. History of ovarian cyst GYN [place] in 1 week  4. Right-sided low back pain without sciatica, unspecified chronicity Call orthopedist    discussed  needs to try to lose some weight too. Plan:     GYN [place] 2/9 and sees Dr 4/9 the next day Call orthopedist today for appt.

## 2019-12-26 ENCOUNTER — Other Ambulatory Visit: Payer: Medicaid Other

## 2019-12-27 ENCOUNTER — Ambulatory Visit (INDEPENDENT_AMBULATORY_CARE_PROVIDER_SITE_OTHER): Payer: Medicaid Other | Admitting: Obstetrics and Gynecology

## 2019-12-27 ENCOUNTER — Other Ambulatory Visit: Payer: Self-pay

## 2019-12-27 ENCOUNTER — Encounter: Payer: Self-pay | Admitting: Obstetrics and Gynecology

## 2019-12-27 VITALS — BP 100/66 | HR 79 | Ht 66.0 in | Wt 345.0 lb

## 2019-12-27 DIAGNOSIS — M25551 Pain in right hip: Secondary | ICD-10-CM | POA: Diagnosis not present

## 2019-12-27 DIAGNOSIS — Z6841 Body Mass Index (BMI) 40.0 and over, adult: Secondary | ICD-10-CM

## 2019-12-27 NOTE — Progress Notes (Signed)
Patient ID: [name], female   DOB: 1985/11/29, 34 y.o.   MRN: [id number]    [facility] Clinic Visit  @DATE @            Patient name: [name] MRN [name]  Date of birth: 1986-02-07  CC & HPI:  [name] is a 34 y.o. female presenting today for discussion of CT results. Patient forgot about u/s appointment. She is still having right inguinal crease and right lower abdomen pain. She has had 3 periods in the 3 months.Her last period was 12/14/2019. She is also trying to conceive.   ROS:  ROS  CT of abdomen w contrast on 11/23/2019 showed: No CT findings in the abdomen or pelvis to explain pain. Normal appendix. Small bilateral ovarian cysts or follicles, in keeping with ultrasound appearance, likely functional in the reproductive age setting.  Pertinent History Reviewed:   Reviewed: Medical         Past Medical History:  Diagnosis Date  . Abdominal pain, chronic, epigastric   . Abdominal pain, chronic, right lower quadrant   . Anxiety   . Bulging disc   . Constipation   . Contraceptive management 07/18/2014  . Depression   . Disk prolapse   . Elevated liver enzymes 12/01/2013  . Fatty liver 07/23/2017  . Gastritis   . GERD (gastroesophageal reflux disease)   . Headache   . Herpes   . Mental disorder    anxiety  . Obesity   . Other and unspecified ovarian cyst 12/01/2013  . Ovarian cyst, right   . Splenomegaly 07/23/2017                              Surgical Hx:    Past Surgical History:  Procedure Laterality Date  . COLONOSCOPY WITH ESOPHAGOGASTRODUODENOSCOPY (EGD) N/A 02/24/2013   04/26/2013 RECTAL BLEEDING DUE TO Moderate sized internal hemorrhoids, EGD: erosive esophagitis due to uncontrolled reflux, moderate erosive gastritis, benign path  . ESOPHAGOGASTRODUODENOSCOPY  01/19/2012   03/20/2012 IN THE ANTRUM/HIATAL HERNIA/  . ESOPHAGOGASTRODUODENOSCOPY N/A 07/31/2016   Procedure: ESOPHAGOGASTRODUODENOSCOPY (EGD);  Surgeon: 08/02/2016, MD;   Location[facility];  Service: Endoscopy;  Laterality: N/A;  10:15 AM  . WISDOM TOOTH EXTRACTION     Medications: Reviewed & Updated - see associated section                       Current Outpatient Medications:  .  DULoxetine (CYMBALTA) 60 MG capsule, Take 60 mg by mouth at bedtime., Disp: , Rfl:  .  FIBER, CORN DEXTRIN, PO, Take 1 tablet by mouth as needed. , Disp: , Rfl:  .  Lactobacillus (ACIDOPHILUS PROBIOTIC) 10 MG TABS, Take 10 mg by mouth 3 (three) times daily., Disp: 30 tablet, Rfl: 0 .  linaclotide (LINZESS) 145 MCG CAPS capsule, Take 145 mcg by mouth daily before breakfast., Disp: , Rfl:  .  LYRICA 150 MG capsule, Take 150 mg by mouth 2 (two) times daily., Disp: , Rfl: 2 .  meloxicam (MOBIC) 15 MG tablet, Take 1 tablet (15 mg total) by mouth daily., Disp: 20 tablet, Rfl: 0 .  pantoprazole (PROTONIX) 40 MG tablet, TAKE 1 TABLET (40 MG TOTAL) BY MOUTH 2 (TWO) TIMES DAILY BEFORE A MEAL., Disp: 60 tablet, Rfl: 10 .  pravastatin (PRAVACHOL) 40 MG tablet, Take 40 mg by mouth at bedtime., Disp: , Rfl: 0 .  tiZANidine (ZANAFLEX)  2 MG tablet, Take 2 mg by mouth every 8 (eight) hours as needed for muscle spasms. , Disp: , Rfl: 2 .  traMADol (ULTRAM) 50 MG tablet, Take 1 tablet (50 mg total) by mouth every 6 (six) hours as needed. (Patient taking differently: Take 50 mg by mouth every 6 (six) hours as needed for moderate pain. ), Disp: 15 tablet, Rfl: 0 .  traZODone (DESYREL) 100 MG tablet, Take 100 mg by mouth at bedtime., Disp: , Rfl:  .  valACYclovir (VALTREX) 1000 MG tablet, Take 1 tablet (1,000 mg total) by mouth daily., Disp: 30 tablet, Rfl: 12   Social History: Reviewed -  reports that she quit smoking about 8 years ago. Her smoking use included cigarettes. She smoked 0.00 packs per day for 0.50 years. She has never used smokeless tobacco.  Objective Findings:  Vitals: Blood pressure 100/66, pulse 79, height 5\' 6"  (1.676 m), weight (!) 345 lb (156.5 kg), last menstrual period  12/14/2019.  PHYSICAL EXAMINATION General appearance - alert, well appearing, and in no distress and overweight Mental status - normal mood, behavior, speech, dress, motor activity, and thought processes, affect appropriate to mood  PELVIC Discussion only of CT and weight loss  Discussion: Discussed with pt weight loss methods including food measurement and calorie counting using apps such as MyNetDiary or My Fitness Pal. Recommended the use of measuring cups and spoons to monitor serving sizes. Encouraged adequate daily water intake, especially prior to meals to eliminate overeating. Additionally encouraged pt to become more active by taking daily walks of at least 30 minute duration, join a local gym such as the [facility] or attend water aerobics classes. Also advised pt to use pedometer on smartphone or utilize a smartband such as FitBit to keep track of daily activity.   At end of discussion, pt had opportunity to ask questions and has no further questions at this time.   Specific discussion of lifestyle changes, behavioral modifications and healthy eating habits as noted above. Greater than 50% was spent in counseling and coordination of care with the patient.  Total time greater than: 10 minutes.   ITrackBites app for weight loss management  Patient Education   Weight Loss Diet  About this topic  There are many "trendy" weight loss diets that are popular today. Many of these diets can end up being more harmful than helpful. The healthiest way to lose weight is to burn more calories than you eat.  A weight loss diet should help you have a healthy view of eating. It is NOT healthy to stop eating to try and lose weight. A good diet plan will help you cut down your food intake and make healthy choices.  A healthy weight loss goal is 1 to 2 pounds (0.5 to 1 kg) per week. It takes 3500 calories to lose 1 pound (0.5 kg). That means cutting out 500 calories every day for 7 days. You can lose these  500 calories per day by eating fewer calories, burning them through exercise, or both.  It may be easier than you think to cut 500 calories from your daily intake. You can:   Switch from whole milk to 1% or skim milk   Switch from regular cheese to fat-free cheese   Use healthier condiment choices  ? Fat-free sour cream or salad dressings  ? Spray butter  ? Diet syrups or jellies over regular   Try frozen yogurt as a dessert rather than eating ice cream.   Skip  the chips. Snack on carrots, vegetables, or fruit. If chips are a favorite of yours, try the baked style.   Eat boiled, seared fish or skinless chicken rather than red meat.   Try flavored no-calorie waters. Do not drink soda and juices that have many calories.  General   Eating smaller meals more often may be helpful. This will help keep your metabolism high and your hunger low. This will keep you from overeating at your next meal. Also, eating meals slowly helps you feel full faster.  ? If eating 3 meals is a part of your lifestyle, choose more low-fat proteins and higher fiber to fill you up at each meal. Skip the snack at night if you can. Drink a calorie-free beverage instead.   Do not skip meals. Most often if you skip a meal, you eat too much at the next meal.   Eat smaller portions. Use a smaller plate or bowl for meals, and when you are eating out, eat half and take the rest home.   Plan ahead. Plan your meals and grocery list before going to the store. Planning will keep you from getting meals from fast foods or restaurants.   Do not go to the grocery store hungry. You are more likely to buy snacks that are not good for you.   Portion out snacks. When you are having a snack, instead of grabbing the whole bag, portion a small amount out to give yourself a stopping point.   Drink water before and after your meals to help fill you up without the calories.   When eating starchy foods, choose whole-grain products.  These have a lot of fiber which will make you feel full. Fiber also helps lower cholesterol and helps with bowel function.   If you need a helpful start, ask your doctor to send you to a dietitian for weight loss help.    What will the results be?  Losing excess weight will make your whole body healthier. You will have more energy for your daily activities and lower your risk for health problems.  What lifestyle changes are needed?  Stay active. Eating healthy is not always enough to lose weight. Burning calories by exercising is a big part of weight loss.  What foods are good to eat?  The key is to watch your portion sizes. It is best to choose foods that are lower in fat and calories.   Choose low-fat meats:  ? Boneless chicken breast  ? Pork loin  ? 90% lean beef  ? Lean [place] meat  ? Fresh fish (not fried)   Choose low-fat dairy products:  ? 1% or skim milk  ? Spray butter or margarine  ? Low-fat or fat-free cheese  ? Frozen yogurt or low calorie ice cream   Choose fresh fruits, green vegetables, beans and lentils, and whole wheat products more often.   Choose smart snacks:  ? Baked chips  ? Pretzels  ? Popcorn with no butter ? use pepper, garlic, or another spice to taste  ? High fiber, low-fat crackers  ? Reduced fat cookies  ? Diet or no-calorie beverages  What foods should be limited or avoided?  Limit high-fat, high-sodium, and high-calorie foods like:   Fried foods   Processed meats   Whole-fat dairy products   Candy, cookies, chips, pastries   Sausage, bacon, any full-fat meats   Soda, juice   Beer, wine, and mixed drinks (alcohol)  Will there be any other care  needed?  What do I do first before trying to lose weight?   Talk to your doctor and dietitian to see if you need to lose weight. Work with them to set your weight loss goals.   If you have a chronic illness, such as high blood sugar or high blood pressure, ask a doctor or dietitian what diet  and exercise is right for you.   Ask your doctor about how much you are able to exercise and what type of exercise is good for you.  Helpful tips   Keep a food journal to help keep you on track.   Do not eat 2 hours before bedtime.   Join a support group.  Tips for burning calories:   If your workplace is near your house, choose to walk or bike to work instead of driving.   Take 20-minute walks each day. Walk around during your lunch break. You will not only burn calories, but raise your energy for the rest of the day.   Take the stairs over the elevators.   Join a gym or exercise class with a friend.   Try to exercise 30 minutes a day. Three 10 minute sessions works too.   Drink lots of water before, during, and after exercise.  Where can I learn more?  https://www.bernard.org/  https://powell.info/  https://www.bernard.org/  https://robinson-schaefer.info/  Weight-Control Information Network  https://www.hensley.biz/  Weight-Control Information Network  MrDecember.dk  Last Reviewed Date  2015-08-13  Consumer Information Use and Disclaimer  This information is not specific medical advice and does not replace information you receive from your health care provider. This is only a brief summary of general information. It does NOT include all information about conditions, illnesses, injuries, tests, procedures, treatments, therapies, discharge instructions or life-style choices that may apply to you. You must talk with your health care provider for complete information about your health and treatment options. This information should not be used to decide whether or not to accept your health care provider's advice, instructions or recommendations. Only your health care provider has the knowledge and training to provide advice that is right  for you.  Copyright  Copyright  2019 [facility] [employer] Information, Inc. and its affiliates and/or licensors. All rights reserved.    Assessment & Plan:   A:  1. Discussion of CT imaging 2. Obesity; weight loss discussion 3. Episodic menorrhagia 4. Conception discussion 5. Single benign ovarian cyst  P:  1. F/u with orthopedic consultation 2. F/u PRN for GYN    By signing my name below, I, Arnette Norris, attest that this documentation has been prepared under the direction and in the presence of Tilda Burrow, MD. Electronically Signed: Arnette Norris Medical Scribe. 12/27/19. 1:59 PM.  I personally performed the services described in this documentation, which was SCRIBED in my presence. The recorded information has been reviewed and considered accurate. It has been edited as necessary during review. Tilda Burrow, MD

## 2019-12-28 ENCOUNTER — Other Ambulatory Visit: Payer: Medicaid Other | Admitting: Adult Health

## 2020-02-07 ENCOUNTER — Other Ambulatory Visit: Payer: Self-pay | Admitting: Family

## 2020-02-07 DIAGNOSIS — M5416 Radiculopathy, lumbar region: Secondary | ICD-10-CM

## 2020-02-19 DIAGNOSIS — Z79891 Long term (current) use of opiate analgesic: Secondary | ICD-10-CM | POA: Insufficient documentation

## 2020-02-19 DIAGNOSIS — G43719 Chronic migraine without aura, intractable, without status migrainosus: Secondary | ICD-10-CM | POA: Insufficient documentation

## 2020-02-20 ENCOUNTER — Other Ambulatory Visit: Payer: Medicaid Other | Admitting: Adult Health

## 2020-03-07 ENCOUNTER — Ambulatory Visit
Admission: RE | Admit: 2020-03-07 | Discharge: 2020-03-07 | Disposition: A | Discharge status: Home / Self Care | Payer: Medicaid Other | Source: Ambulatory Visit | Attending: Family | Admitting: Family

## 2020-03-07 ENCOUNTER — Other Ambulatory Visit: Payer: Self-pay

## 2020-03-07 DIAGNOSIS — M5416 Radiculopathy, lumbar region: Secondary | ICD-10-CM

## 2020-04-22 DIAGNOSIS — I10 Essential (primary) hypertension: Secondary | ICD-10-CM | POA: Insufficient documentation

## 2020-05-29 ENCOUNTER — Encounter: Payer: Self-pay | Admitting: Adult Health

## 2020-05-29 ENCOUNTER — Ambulatory Visit (INDEPENDENT_AMBULATORY_CARE_PROVIDER_SITE_OTHER): Payer: Medicaid Other | Admitting: Adult Health

## 2020-05-29 VITALS — BP 121/78 | HR 87 | Ht 66.0 in | Wt 352.6 lb

## 2020-05-29 DIAGNOSIS — O3680X0 Pregnancy with inconclusive fetal viability, not applicable or unspecified: Secondary | ICD-10-CM | POA: Diagnosis not present

## 2020-05-29 DIAGNOSIS — Z3A10 10 weeks gestation of pregnancy: Secondary | ICD-10-CM

## 2020-05-29 DIAGNOSIS — Z3201 Encounter for pregnancy test, result positive: Secondary | ICD-10-CM | POA: Diagnosis not present

## 2020-05-29 LAB — POCT URINE PREGNANCY: Preg Test, Ur: POSITIVE — AB

## 2020-05-29 MED ORDER — PROMETHAZINE HCL 25 MG PO TABS: 25.0000 mg | ORAL_TABLET | Freq: Four times a day (QID) | ORAL | 1 refills | Status: DC | PRN

## 2020-05-29 MED ORDER — PRENATAL PLUS IRON 29-1 MG PO TABS: ORAL_TABLET | ORAL | 12 refills | Status: DC

## 2020-05-29 NOTE — Progress Notes (Signed)
°  Subjective:     Patient ID: [name], female   DOB: 1986/08/04, 34 y.o.   MRN: [id number]  HPI [name] is a 34 year old white female,separated, in for UPT, has missed periods and had 4+HPTs. PCP is Dr Selena Batten.   Review of Systems +missed periods with 4+HPTs +nasuea  Has chronic back pain Reviewed past medical,surgical, social and family history. Reviewed medications and allergies.     Objective:   Physical Exam BP 121/78 (BP Location: Right Arm, Patient Position: Sitting, Cuff Size: Large)    Pulse 87    Ht 5\' 6"  (1.676 m)    Wt (!) 352 lb 9.6 oz (159.9 kg)    LMP 03/20/2020    BMI 56.91 kg/m UPT +, about 10 weeks by LPM with EDD 12/25/20 Skin warm and dry. Neck: mid line trachea, normal thyroid, good ROM, no lymphadenopathy noted. Lungs: clear to ausculation bilaterally. Cardiovascular: regular rate and rhythm. Abdomen is soft and non tender Fall risk is low    Assessment:     1. Pregnancy examination or test, positive result Take PNV Reviewed med list, stop lyric,pravochol,tramadol and Zanaflex,can take Cymbalta til third trimester,valtrex and Protonix can be taken  2. [redacted] weeks gestation of pregnancy Will rx phenergan for nausea Meds ordered this encounter  Medications   Prenatal Vit-Iron Carbonyl-FA (PRENATAL PLUS IRON) 29-1 MG TABS    Sig: Take 1 daily    Dispense:  30 tablet    Refill:  12    Order Specific Question:   Supervising Provider    Answer:   02/22/21 H [[id number]]   promethazine (PHENERGAN) 25 MG tablet    Sig: Take 1 tablet (25 mg total) by mouth every 6 (six) hours as needed for nausea or vomiting.    Dispense:  30 tablet    Refill:  1    Order Specific Question:   Supervising Provider    Answer:   Duane Lope, LUTHER H [[id number]]    3. Encounter to determine fetal viability of pregnancy, single or unspecified fetus Return in 1 week for dating Despina Hidden    Plan:     Review handout by [facility]

## 2020-05-29 NOTE — Patient Instructions (Signed)
First Trimester of Pregnancy The first trimester of pregnancy is from week 1 until the end of week 13 (months 1 through 3). A week after a sperm fertilizes an egg, the egg will implant on the wall of the uterus. This embryo will begin to develop into a baby. Genes from you and your partner will form the baby. The female genes will determine whether the baby will be a boy or a girl. At 6-8 weeks, the eyes and face will be formed, and the heartbeat can be seen on ultrasound. At the end of 12 weeks, all the baby's organs will be formed. Now that you are pregnant, you will want to do everything you can to have a healthy baby. Two of the most important things are to get good prenatal care and to follow your health care provider's instructions. Prenatal care is all the medical care you receive before the baby's birth. This care will help prevent, find, and treat any problems during the pregnancy and childbirth. Body changes during your first trimester Your body goes through many changes during pregnancy. The changes vary from woman to woman.  You may gain or lose a couple of pounds at first.  You may feel sick to your stomach (nauseous) and you may throw up (vomit). If the vomiting is uncontrollable, call your health care provider.  You may tire easily.  You may develop headaches that can be relieved by medicines. All medicines should be approved by your health care provider.  You may urinate more often. Painful urination may mean you have a bladder infection.  You may develop heartburn as a result of your pregnancy.  You may develop constipation because certain hormones are causing the muscles that push stool through your intestines to slow down.  You may develop hemorrhoids or swollen veins (varicose veins).  Your breasts may begin to grow larger and become tender. Your nipples may stick out more, and the tissue that surrounds them (areola) may become darker.  Your gums may bleed and may be  sensitive to brushing and flossing.  Dark spots or blotches (chloasma, mask of pregnancy) may develop on your face. This will likely fade after the baby is born.  Your menstrual periods will stop.  You may have a loss of appetite.  You may develop cravings for certain kinds of food.  You may have changes in your emotions from day to day, such as being excited to be pregnant or being concerned that something may go wrong with the pregnancy and baby.  You may have more vivid and strange dreams.  You may have changes in your hair. These can include thickening of your hair, rapid growth, and changes in texture. Some women also have hair loss during or after pregnancy, or hair that feels dry or thin. Your hair will most likely return to normal after your baby is born. What to expect at prenatal visits During a routine prenatal visit:  You will be weighed to make sure you and the baby are growing normally.  Your blood pressure will be taken.  Your abdomen will be measured to track your baby's growth.  The fetal heartbeat will be listened to between weeks 10 and 14 of your pregnancy.  Test results from any previous visits will be discussed. Your health care provider may ask you:  How you are feeling.  If you are feeling the baby move.  If you have had any abnormal symptoms, such as leaking fluid, bleeding, severe headaches, or abdominal   cramping.  If you are using any tobacco products, including cigarettes, chewing tobacco, and electronic cigarettes.  If you have any questions. Other tests that may be performed during your first trimester include:  Blood tests to find your blood type and to check for the presence of any previous infections. The tests will also be used to check for low iron levels (anemia) and protein on red blood cells (Rh antibodies). Depending on your risk factors, or if you previously had diabetes during pregnancy, you may have tests to check for high blood sugar  that affects pregnant women (gestational diabetes).  Urine tests to check for infections, diabetes, or protein in the urine.  An ultrasound to confirm the proper growth and development of the baby.  Fetal screens for spinal cord problems (spina bifida) and Down syndrome.  HIV (human immunodeficiency virus) testing. Routine prenatal testing includes screening for HIV, unless you choose not to have this test.  You may need other tests to make sure you and the baby are doing well. Follow these instructions at home: Medicines  Follow your health care provider's instructions regarding medicine use. Specific medicines may be either safe or unsafe to take during pregnancy.  Take a prenatal vitamin that contains at least 600 micrograms (mcg) of folic acid.  If you develop constipation, try taking a stool softener if your health care provider approves. Eating and drinking   Eat a balanced diet that includes fresh fruits and vegetables, whole grains, good sources of protein such as meat, eggs, or tofu, and low-fat dairy. Your health care provider will help you determine the amount of weight gain that is right for you.  Avoid raw meat and uncooked cheese. These carry germs that can cause birth defects in the baby.  Eating four or five small meals rather than three large meals a day may help relieve nausea and vomiting. If you start to feel nauseous, eating a few soda crackers can be helpful. Drinking liquids between meals, instead of during meals, also seems to help ease nausea and vomiting.  Limit foods that are high in fat and processed sugars, such as fried and sweet foods.  To prevent constipation: ? Eat foods that are high in fiber, such as fresh fruits and vegetables, whole grains, and beans. ? Drink enough fluid to keep your urine clear or pale yellow. Activity  Exercise only as directed by your health care provider. Most women can continue their usual exercise routine during  pregnancy. Try to exercise for 30 minutes at least 5 days a week. Exercising will help you: ? Control your weight. ? Stay in shape. ? Be prepared for labor and delivery.  Experiencing pain or cramping in the lower abdomen or lower back is a good sign that you should stop exercising. Check with your health care provider before continuing with normal exercises.  Try to avoid standing for long periods of time. Move your legs often if you must stand in one place for a long time.  Avoid heavy lifting.  Wear low-heeled shoes and practice good posture.  You may continue to have sex unless your health care provider tells you not to. Relieving pain and discomfort  Wear a good support bra to relieve breast tenderness.  Take warm sitz baths to soothe any pain or discomfort caused by hemorrhoids. Use hemorrhoid cream if your health care provider approves.  Rest with your legs elevated if you have leg cramps or low back pain.  If you develop varicose veins in   your legs, wear support hose. Elevate your feet for 15 minutes, 3-4 times a day. Limit salt in your diet. Prenatal care  Schedule your prenatal visits by the twelfth week of pregnancy. They are usually scheduled monthly at first, then more often in the last 2 months before delivery.  Write down your questions. Take them to your prenatal visits.  Keep all your prenatal visits as told by your health care provider. This is important. Safety  Wear your seat belt at all times when driving.  Make a list of emergency phone numbers, including numbers for family, friends, the hospital, and police and fire departments. General instructions  Ask your health care provider for a referral to a local prenatal education class. Begin classes no later than the beginning of month 6 of your pregnancy.  Ask for help if you have counseling or nutritional needs during pregnancy. Your health care provider can offer advice or refer you to specialists for help  with various needs.  Do not use hot tubs, steam rooms, or saunas.  Do not douche or use tampons or scented sanitary pads.  Do not cross your legs for long periods of time.  Avoid cat litter boxes and soil used by cats. These carry germs that can cause birth defects in the baby and possibly loss of the fetus by miscarriage or stillbirth.  Avoid all smoking, herbs, alcohol, and medicines not prescribed by your health care provider. Chemicals in these products affect the formation and growth of the baby.  Do not use any products that contain nicotine or tobacco, such as cigarettes and e-cigarettes. If you need help quitting, ask your health care provider. You may receive counseling support and other resources to help you quit.  Schedule a dentist appointment. At home, brush your teeth with a soft toothbrush and be gentle when you floss. Contact a health care provider if:  You have dizziness.  You have mild pelvic cramps, pelvic pressure, or nagging pain in the abdominal area.  You have persistent nausea, vomiting, or diarrhea.  You have a bad smelling vaginal discharge.  You have pain when you urinate.  You notice increased swelling in your face, hands, legs, or ankles.  You are exposed to fifth disease or chickenpox.  You are exposed to German measles (rubella) and have never had it. Get help right away if:  You have a fever.  You are leaking fluid from your vagina.  You have spotting or bleeding from your vagina.  You have severe abdominal cramping or pain.  You have rapid weight gain or loss.  You vomit blood or material that looks like coffee grounds.  You develop a severe headache.  You have shortness of breath.  You have any kind of trauma, such as from a fall or a car accident. Summary  The first trimester of pregnancy is from week 1 until the end of week 13 (months 1 through 3).  Your body goes through many changes during pregnancy. The changes vary from  woman to woman.  You will have routine prenatal visits. During those visits, your health care provider will examine you, discuss any test results you may have, and talk with you about how you are feeling. This information is not intended to replace advice given to you by your health care provider. Make sure you discuss any questions you have with your health care provider. Document Revised: 10/15/2017 Document Reviewed: 10/14/2016 [employer] Patient Education  2020 [employer]  

## 2020-06-05 ENCOUNTER — Ambulatory Visit (INDEPENDENT_AMBULATORY_CARE_PROVIDER_SITE_OTHER): Payer: Medicaid Other

## 2020-06-05 ENCOUNTER — Other Ambulatory Visit: Payer: Self-pay | Admitting: Adult Health

## 2020-06-05 DIAGNOSIS — O3680X0 Pregnancy with inconclusive fetal viability, not applicable or unspecified: Secondary | ICD-10-CM

## 2020-06-05 DIAGNOSIS — Z3A11 11 weeks gestation of pregnancy: Secondary | ICD-10-CM

## 2020-06-05 NOTE — Progress Notes (Signed)
[place] TA/TV: 11 wks single IUP,positive fht 170 bpm,crl 38.85 mm,normal ovaries

## 2020-06-19 ENCOUNTER — Other Ambulatory Visit: Payer: Self-pay | Admitting: Obstetrics & Gynecology

## 2020-06-19 DIAGNOSIS — O0990 Supervision of high risk pregnancy, unspecified, unspecified trimester: Secondary | ICD-10-CM | POA: Insufficient documentation

## 2020-06-19 DIAGNOSIS — Z3682 Encounter for antenatal screening for nuchal translucency: Secondary | ICD-10-CM

## 2020-06-20 ENCOUNTER — Encounter: Payer: Medicaid Other | Admitting: Advanced Practice Midwife

## 2020-06-20 ENCOUNTER — Other Ambulatory Visit: Payer: Medicaid Other

## 2020-06-20 ENCOUNTER — Ambulatory Visit: Payer: Medicaid Other | Admitting: *Deleted

## 2020-07-04 ENCOUNTER — Encounter: Payer: Self-pay | Admitting: Women's Health

## 2020-07-04 ENCOUNTER — Other Ambulatory Visit: Payer: Self-pay

## 2020-07-04 ENCOUNTER — Ambulatory Visit (INDEPENDENT_AMBULATORY_CARE_PROVIDER_SITE_OTHER): Payer: Medicaid Other | Admitting: Women's Health

## 2020-07-04 VITALS — BP 121/89 | HR 104 | Wt 348.8 lb

## 2020-07-04 DIAGNOSIS — Z1389 Encounter for screening for other disorder: Secondary | ICD-10-CM | POA: Diagnosis not present

## 2020-07-04 DIAGNOSIS — F418 Other specified anxiety disorders: Secondary | ICD-10-CM | POA: Insufficient documentation

## 2020-07-04 DIAGNOSIS — I10 Essential (primary) hypertension: Secondary | ICD-10-CM | POA: Insufficient documentation

## 2020-07-04 DIAGNOSIS — O0992 Supervision of high risk pregnancy, unspecified, second trimester: Secondary | ICD-10-CM | POA: Diagnosis not present

## 2020-07-04 DIAGNOSIS — Z331 Pregnant state, incidental: Secondary | ICD-10-CM

## 2020-07-04 DIAGNOSIS — Z79891 Long term (current) use of opiate analgesic: Secondary | ICD-10-CM

## 2020-07-04 DIAGNOSIS — Z3A15 15 weeks gestation of pregnancy: Secondary | ICD-10-CM | POA: Diagnosis not present

## 2020-07-04 DIAGNOSIS — Z363 Encounter for antenatal screening for malformations: Secondary | ICD-10-CM

## 2020-07-04 LAB — POCT URINALYSIS DIPSTICK OB
Blood, UA: NEGATIVE
Glucose, UA: NEGATIVE
Ketones, UA: NEGATIVE
Leukocytes, UA: NEGATIVE
Nitrite, UA: NEGATIVE
POC,PROTEIN,UA: NEGATIVE

## 2020-07-04 MED ORDER — LABETALOL HCL 200 MG PO TABS: 200.0000 mg | ORAL_TABLET | Freq: Two times a day (BID) | ORAL | 3 refills | Status: DC

## 2020-07-04 NOTE — Patient Instructions (Addendum)
[name], I greatly value your feedback.  If you receive a survey following your visit with us today, we appreciate you taking the time to fill it out.  Thanks, [name], CNM, WHNP-BC  [facility] ([address] [place], [place] [address]) Entrance C, located off of [address] Free 24/7 valet parking   Begin taking 162mg  (two 81mg  tablets) baby aspirin daily to decrease the risk of preeclampsia during pregnancy   Stop taking the metoprolol (toprol xl), I am sending in a prescription for labetalol for your blood pressure Do no take the Trokendi (topiramax) for headaches- I have put a list of things you can try below Continue to wean yourself off of the oxycodone until you have completely quit. Tylenol is the best thing to take during pregnancy for pain, I have put some other things you can try for low back pain below  For your lower back pain you may:  Purchase a pregnancy/maternity support belt from [employer], [employer], [employer], [employer], etc and wear it while you are up and about  Take warm baths  Use a heating pad to your lower back for no longer than 20 minutes at a time, and do not place near abdomen  Take tylenol as needed. Please follow directions on the bottle  Kinesiology tape (can get from sporting goods store), google how to tape belly for pregnancy    For Headaches:   Stay well hydrated, drink enough water so that your urine is clear, sometimes if you are dehydrated you can get headaches  Eat small frequent meals and snacks, sometimes if you are hungry you can get headaches  Sometimes you get headaches during pregnancy from the pregnancy hormones  You can try tylenol (1-2 regular strength 325mg  or 1-2 extra strength 500mg ) as directed on the box. The least amount of medication that works is best.   Cool compresses (cool wet washcloth or ice pack) to area of head that is hurting  You can also try drinking a caffeinated  drink to see if this will help  If not helping, try below:  For Prevention of Headaches/Migraines:  CoQ10 100mg  three times daily  Vitamin B2 400mg  daily  Magnesium Oxide 400-600mg  daily  If You Get a Bad Headache/Migraine:  Benadryl 25mg    Magnesium Oxide  1 large Gatorade  2 extra strength Tylenol (1,000mg  total)  1 cup coffee or Coke  If this doesn't help please call us @ [phone]     Go to [email or web address] to register for FREE online childbirth classes  [place] Pediatricians/Family Doctors:  [facility] [phone]            [facility] [phone]                 [facility] [phone] (usually not accepting new patients unless you have family there already, you are always welcome to call and ask)       [facility] [phone]       [facility]ians/Family Doctors:   [facility]: [phone]  [facility]: [phone]  [facility]: [phone]  [facility]:   [facility]: [phone]   [facility]: [phone]  [facility]:  [facility]: [phone]    Home Blood Pressure Monitoring for Patients   Your provider has recommended that you check your blood pressure (BP) at least once a week at home. If you do not  have a blood pressure cuff at home, one will be provided for you. Contact your provider if you have not received your monitor within 1 week.   Helpful Tips for Accurate Home Blood Pressure Checks  . Don't smoke, exercise, or drink caffeine 30 minutes before checking your BP . Use the restroom before checking your BP (a full bladder can raise your pressure) . Relax in a comfortable upright chair . Feet on the ground . Left arm resting comfortably on a flat surface at the level of your heart . Legs uncrossed . Back supported . Sit quietly  and don't talk . Place the cuff on your bare arm . Adjust snuggly, so that only two fingertips can fit between your skin and the top of the cuff . Check 2 readings separated by at least one minute . Keep a log of your BP readings . For a visual, please reference this diagram: http://ccnc.care/bpdiagram  Provider Name: [facility]     Phone: [phone]  Zone 1: ALL CLEAR  Continue to monitor your symptoms:  . BP reading is less than 140 (top number) or less than 90 (bottom number)  . No right upper stomach pain . No headaches or seeing spots . No feeling nauseated or throwing up . No swelling in face and hands  Zone 2: CAUTION Call your doctor's office for any of the following:  . BP reading is greater than 140 (top number) or greater than 90 (bottom number)  . Stomach pain under your ribs in the middle or right side . Headaches or seeing spots . Feeling nauseated or throwing up . Swelling in face and hands  Zone 3: EMERGENCY  Seek immediate medical care if you have any of the following:  . BP reading is greater than160 (top number) or greater than 110 (bottom number) . Severe headaches not improving with Tylenol . Serious difficulty catching your breath . Any worsening symptoms from Zone 2     Second Trimester of Pregnancy The second trimester is from week 14 through week 27 (months 4 through 6). The second trimester is often a time when you feel your best. Your body has adjusted to being pregnant, and you begin to feel better physically. Usually, morning sickness has lessened or quit completely, you may have more energy, and you may have an increase in appetite. The second trimester is also a time when the fetus is growing rapidly. At the end of the sixth month, the fetus is about 9 inches long and weighs about 1 pounds. You will likely begin to feel the baby move (quickening) between 16 and 20 weeks of pregnancy. Body changes during your second trimester Your body  continues to go through many changes during your second trimester. The changes vary from woman to woman.  Your weight will continue to increase. You will notice your lower abdomen bulging out.  You may begin to get stretch marks on your hips, abdomen, and breasts.  You may develop headaches that can be relieved by medicines. The medicines should be approved by your health care provider.  You may urinate more often because the fetus is pressing on your bladder.  You may develop or continue to have heartburn as a result of your pregnancy.  You may develop constipation because certain hormones are causing the muscles that push waste through your intestines to slow down.  You may develop hemorrhoids or swollen, bulging veins (varicose veins).  You may have back pain. This is caused by: ?  Weight gain. ? Pregnancy hormones that are relaxing the joints in your pelvis. ? A shift in weight and the muscles that support your balance.  Your breasts will continue to grow and they will continue to become tender.  Your gums may bleed and may be sensitive to brushing and flossing.  Dark spots or blotches (chloasma, mask of pregnancy) may develop on your face. This will likely fade after the baby is born.  A dark line from your belly button to the pubic area (linea nigra) may appear. This will likely fade after the baby is born.  You may have changes in your hair. These can include thickening of your hair, rapid growth, and changes in texture. Some women also have hair loss during or after pregnancy, or hair that feels dry or thin. Your hair will most likely return to normal after your baby is born.  What to expect at prenatal visits During a routine prenatal visit:  You will be weighed to make sure you and the fetus are growing normally.  Your blood pressure will be taken.  Your abdomen will be measured to track your baby's growth.  The fetal heartbeat will be listened to.  Any test results  from the previous visit will be discussed.  Your health care provider may ask you:  How you are feeling.  If you are feeling the baby move.  If you have had any abnormal symptoms, such as leaking fluid, bleeding, severe headaches, or abdominal cramping.  If you are using any tobacco products, including cigarettes, chewing tobacco, and electronic cigarettes.  If you have any questions.  Other tests that may be performed during your second trimester include:  Blood tests that check for: ? Low iron levels (anemia). ? High blood sugar that affects pregnant women (gestational diabetes) between 70 and 28 weeks. ? Rh antibodies. This is to check for a protein on red blood cells (Rh factor).  Urine tests to check for infections, diabetes, or protein in the urine.  An ultrasound to confirm the proper growth and development of the baby.  An amniocentesis to check for possible genetic problems.  Fetal screens for spina bifida and Down syndrome.  HIV (human immunodeficiency virus) testing. Routine prenatal testing includes screening for HIV, unless you choose not to have this test.  Follow these instructions at home: Medicines  Follow your health care provider's instructions regarding medicine use. Specific medicines may be either safe or unsafe to take during pregnancy.  Take a prenatal vitamin that contains at least 600 micrograms (mcg) of folic acid.  If you develop constipation, try taking a stool softener if your health care provider approves. Eating and drinking  Eat a balanced diet that includes fresh fruits and vegetables, whole grains, good sources of protein such as meat, eggs, or tofu, and low-fat dairy. Your health care provider will help you determine the amount of weight gain that is right for you.  Avoid raw meat and uncooked cheese. These carry germs that can cause birth defects in the baby.  If you have low calcium intake from food, talk to your health care provider  about whether you should take a daily calcium supplement.  Limit foods that are high in fat and processed sugars, such as fried and sweet foods.  To prevent constipation: ? Drink enough fluid to keep your urine clear or pale yellow. ? Eat foods that are high in fiber, such as fresh fruits and vegetables, whole grains, and beans. Activity  Exercise only  as directed by your health care provider. Most women can continue their usual exercise routine during pregnancy. Try to exercise for 30 minutes at least 5 days a week. Stop exercising if you experience uterine contractions.  Avoid heavy lifting, wear low heel shoes, and practice good posture.  A sexual relationship may be continued unless your health care provider directs you otherwise. Relieving pain and discomfort  Wear a good support bra to prevent discomfort from breast tenderness.  Take warm sitz baths to soothe any pain or discomfort caused by hemorrhoids. Use hemorrhoid cream if your health care provider approves.  Rest with your legs elevated if you have leg cramps or low back pain.  If you develop varicose veins, wear support hose. Elevate your feet for 15 minutes, 3-4 times a day. Limit salt in your diet. Prenatal Care  Write down your questions. Take them to your prenatal visits.  Keep all your prenatal visits as told by your health care provider. This is important. Safety  Wear your seat belt at all times when driving.  Make a list of emergency phone numbers, including numbers for family, friends, the hospital, and police and fire departments. General instructions  Ask your health care provider for a referral to a local prenatal education class. Begin classes no later than the beginning of month 6 of your pregnancy.  Ask for help if you have counseling or nutritional needs during pregnancy. Your health care provider can offer advice or refer you to specialists for help with various needs.  Do not use hot tubs, steam  rooms, or saunas.  Do not douche or use tampons or scented sanitary pads.  Do not cross your legs for long periods of time.  Avoid cat litter boxes and soil used by cats. These carry germs that can cause birth defects in the baby and possibly loss of the fetus by miscarriage or stillbirth.  Avoid all smoking, herbs, alcohol, and unprescribed drugs. Chemicals in these products can affect the formation and growth of the baby.  Do not use any products that contain nicotine or tobacco, such as cigarettes and e-cigarettes. If you need help quitting, ask your health care provider.  Visit your dentist if you have not gone yet during your pregnancy. Use a soft toothbrush to brush your teeth and be gentle when you floss. Contact a health care provider if:  You have dizziness.  You have mild pelvic cramps, pelvic pressure, or nagging pain in the abdominal area.  You have persistent nausea, vomiting, or diarrhea.  You have a bad smelling vaginal discharge.  You have pain when you urinate. Get help right away if:  You have a fever.  You are leaking fluid from your vagina.  You have spotting or bleeding from your vagina.  You have severe abdominal cramping or pain.  You have rapid weight gain or weight loss.  You have shortness of breath with chest pain.  You notice sudden or extreme swelling of your face, hands, ankles, feet, or legs.  You have not felt your baby move in over an hour.  You have severe headaches that do not go away when you take medicine.  You have vision changes. Summary  The second trimester is from week 14 through week 27 (months 4 through 6). It is also a time when the fetus is growing rapidly.  Your body goes through many changes during pregnancy. The changes vary from woman to woman.  Avoid all smoking, herbs, alcohol, and unprescribed drugs. These  chemicals affect the formation and growth your baby.  Do not use any tobacco products, such as cigarettes,  chewing tobacco, and e-cigarettes. If you need help quitting, ask your health care provider.  Contact your health care provider if you have any questions. Keep all prenatal visits as told by your health care provider. This is important. This information is not intended to replace advice given to you by your health care provider. Make sure you discuss any questions you have with your health care provider. Document Released: 10/27/2001 Document Revised: 04/09/2016 Document Reviewed: 01/03/2013 Elsevier Interactive Patient Education  2017 [employer].

## 2020-07-04 NOTE — Progress Notes (Signed)
INITIAL OBSTETRICAL VISIT Patient name: [name] MRN [id number]  Date of birth: 1986/11/16 Chief Complaint:   Initial Prenatal Visit  History of Present Illness:   [name] is a 34 y.o. G52P1001 Caucasian female at [redacted]w[redacted]d by LMP c/w u/s at 10 weeks with an Estimated Date of Delivery: 12/25/20 being seen today for her initial obstetrical visit.   Her obstetrical history is significant for term uncomplicated SVB x 1 Today she reports mild nausea, no vomiting, has phenergan at home.  CHTN on metoprolol 25mg  'for years' Dep on cymbalta, doing well Migraines, on Trokendi (topiramate) Chronic low back pain- on oxycodone 7.5mg  TID from Dr. , has already weaned herself to 1 QOD  Depression screen [facility] 2/9 08/31/2018 08/18/2017 09/24/2016 09/07/2016  Decreased Interest 0 0 0 2  Down, Depressed, Hopeless 0 0 0 1  PHQ - 2 Score 0 0 0 3  Altered sleeping - - - 3  Tired, decreased energy - - - 3  Change in appetite - - - 3  Feeling bad or failure about yourself  - - - 0  Trouble concentrating - - - 0  Moving slowly or fidgety/restless - - - 0  Suicidal thoughts - - - 0  PHQ-9 Score - - - 12  Difficult doing work/chores - - - Somewhat difficult  Some recent data might be hidden    Patient's last menstrual period was 03/20/2020. Last pap 08/31/18. Results were: normal Review of Systems:   Pertinent items are noted in HPI Denies cramping/contractions, leakage of fluid, vaginal bleeding, abnormal vaginal discharge w/ itching/odor/irritation, headaches, visual changes, shortness of breath, chest pain, abdominal pain, severe nausea/vomiting, or problems with urination or bowel movements unless otherwise stated above.  Pertinent History Reviewed:  Reviewed past medical,surgical, social, obstetrical and family history.  Reviewed problem list, medications and allergies. OB History  Gravida Para Term Preterm AB Living  2 1 1     1   SAB TAB Ectopic Multiple Live Births          1     # Outcome Date GA Lbr Len/2nd Weight Sex Delivery Anes PTL Lv  2 Current           1 Term 09/15/08 [redacted]w[redacted]d  6 lb 8 oz (2.948 kg) M Vag-Spont EPI N LIV   Physical Assessment:   Vitals:   07/04/20 0849  BP: 121/89  Pulse: (!) 104  Weight: (!) 348 lb 12.8 oz (158.2 kg)  Body mass index is 56.3 kg/m.       Physical Examination:  General appearance - well appearing, and in no distress  Mental status - alert, oriented to person, place, and time  Psych:  She has a normal mood and affect  Skin - warm and dry, normal color, no suspicious lesions noted  Chest - effort normal, all lung fields clear to auscultation bilaterally  Heart - normal rate and regular rhythm  Abdomen - soft, nontender  Extremities:  No swelling or varicosities noted  Thin prep pap is not done   TODAY'S FHR: got briefly w/ doppler, then unable to get again, informal TA u/s w/ +FCA and active fetus  Results for orders placed or performed in visit on 07/04/20 (from the past 24 hour(s))  POC Urinalysis Dipstick OB   Collection Time: 07/04/20  9:26 AM  Result Value Ref Range   Color, UA     Clarity, UA     Glucose, UA Negative Negative   Bilirubin, UA  Ketones, UA neg    Spec Grav, UA     Blood, UA neg    pH, UA     POC,PROTEIN,UA Negative Negative, Trace, Small (1+), Moderate (2+), Large (3+), 4+   Urobilinogen, UA     Nitrite, UA neg    Leukocytes, UA Negative Negative   Appearance     Odor      Assessment & Plan:  1) High-Risk Pregnancy G2P1001 at [redacted]w[redacted]d with an Estimated Date of Delivery: 12/25/20   2) Initial OB visit  3) CHTN> stop metoprolol d/t r/f FGR, rx labetalol 200mg  BID, start ASA 162mg  daily now, baseline labs today  4) Dep> on cymbalta, doing well  5) Migraines> on Trokendi (topiramate), stop, gave printed list of things to do for headaches, let know if not helping  6) Chronic low back pain> on oxycodone 7.5mg  TID prior to pregnancy, has weaned herself to 1 QOD, to continue weaning  completely off. APAP prn, gave printed list of things to help low back pain  Meds:  Meds ordered this encounter  Medications  . labetalol (NORMODYNE) 200 MG tablet    Sig: Take 1 tablet (200 mg total) by mouth 2 (two) times daily.    Dispense:  60 tablet    Refill:  3    Order Specific Question:   Supervising Provider    Answer:   H [[id number]]    Initial labs obtained Continue prenatal vitamins Reviewed n/v relief measures and warning s/s to report Reviewed recommended weight gain based on pre-gravid BMI Encouraged well-balanced diet Genetic & carrier screening discussed: requests Panorama, AFP and Horizon 14 , too late for NT/IT Ultrasound discussed; fetal survey: requested CCNC completed> form faxed if has or is planning to apply for medicaid The nature of [facility] [place] with multiple MDs and other Advanced Practice Providers was explained to patient; also emphasized that fellows, residents, and students are part of our team. Has home bp cuff. Check bp weekly, let Duane Lope know if >140/90.   Indications for ASA therapy (per uptodate) One of the following: CHTN Yes   Indications for early A1C (per uptodate) BMI >=25 (>=23 in Asian women) AND one of the following HTN or on therapy for hypertension Yes Other clinical condition associated with insulin resistance (eg, severe obesity, acanthosis nigricans) Yes  Follow-up: Return in about 3 weeks (around 07/25/2020) for HROB, [place], in person, MD only.   Orders Placed This Encounter  Procedures  . Urine Culture  . GC/Chlamydia Probe Amp  . 09/24/2020 OB Comp + 14 Wk  . Genetic Screening  . Pain Management Screening Profile (10S)  . CBC/D/Plt+RPR+Rh+ABO+Rub Ab...  . Hemoglobin A1c  . Comprehensive metabolic panel  . Protein / creatinine ratio, urine  . AFP, Serum, Open Spina Bifida  . POC Urinalysis Dipstick OB    RS:WNIOEVO CNM, [facility] 07/04/2020 10:02 AM

## 2020-07-05 LAB — PMP SCREEN PROFILE (10S), URINE
Amphetamine Scrn, Ur: NEGATIVE ng/mL
BARBITURATE SCREEN URINE: NEGATIVE ng/mL
BENZODIAZEPINE SCREEN, URINE: NEGATIVE ng/mL
CANNABINOIDS UR QL SCN: NEGATIVE ng/mL
Cocaine (Metab) Scrn, Ur: NEGATIVE ng/mL
Creatinine(Crt), U: 277.5 mg/dL (ref 20.0–300.0)
Methadone Screen, Urine: NEGATIVE ng/mL
OXYCODONE+OXYMORPHONE UR QL SCN: NEGATIVE ng/mL
Opiate Scrn, Ur: NEGATIVE ng/mL
Ph of Urine: 5.6 (ref 4.5–8.9)
Phencyclidine Qn, Ur: NEGATIVE ng/mL
Propoxyphene Scrn, Ur: NEGATIVE ng/mL

## 2020-07-06 LAB — CBC/D/PLT+RPR+RH+ABO+RUB AB...
Antibody Screen: NEGATIVE
Basophils Absolute: 0 10*3/uL (ref 0.0–0.2)
Basos: 0 %
EOS (ABSOLUTE): 0.1 10*3/uL (ref 0.0–0.4)
Eos: 1 %
HCV Ab: 0.1 s/co ratio (ref 0.0–0.9)
HIV Screen 4th Generation wRfx: NONREACTIVE
Hematocrit: 40.3 % (ref 34.0–46.6)
Hemoglobin: 13.7 g/dL (ref 11.1–15.9)
Hepatitis B Surface Ag: NEGATIVE
Immature Grans (Abs): 0 10*3/uL (ref 0.0–0.1)
Immature Granulocytes: 0 %
Lymphocytes Absolute: 2 10*3/uL (ref 0.7–3.1)
Lymphs: 19 %
MCH: 30.9 pg (ref 26.6–33.0)
MCHC: 34 g/dL (ref 31.5–35.7)
MCV: 91 fL (ref 79–97)
Monocytes Absolute: 0.6 10*3/uL (ref 0.1–0.9)
Monocytes: 5 %
Neutrophils Absolute: 8 10*3/uL — ABNORMAL HIGH (ref 1.4–7.0)
Neutrophils: 75 %
Platelets: 224 10*3/uL (ref 150–450)
RBC: 4.44 x10E6/uL (ref 3.77–5.28)
RDW: 14.1 % (ref 11.7–15.4)
RPR Ser Ql: NONREACTIVE
Rh Factor: POSITIVE
Rubella Antibodies, IGG: 2.89 index (ref >0.99)
WBC: 10.7 10*3/uL (ref 3.4–10.8)

## 2020-07-06 LAB — COMPREHENSIVE METABOLIC PANEL
ALT: 15 IU/L (ref 0–32)
AST: 29 IU/L (ref 0–40)
Albumin/Globulin Ratio: 1.6 (ref 1.2–2.2)
Albumin: 3.8 g/dL (ref 3.8–4.8)
Alkaline Phosphatase: 115 IU/L (ref 48–121)
BUN/Creatinine Ratio: 6 — ABNORMAL LOW (ref 9–23)
BUN: 5 mg/dL — ABNORMAL LOW (ref 6–20)
Bilirubin Total: 0.4 mg/dL (ref 0.0–1.2)
CO2: 20 mmol/L (ref 20–29)
Calcium: 9.2 mg/dL (ref 8.7–10.2)
Chloride: 106 mmol/L (ref 96–106)
Creatinine, Ser: 0.77 mg/dL (ref 0.57–1.00)
GFR calc Af Amer: 119 mL/min/{1.73_m2} (ref >59)
GFR calc non Af Amer: 103 mL/min/{1.73_m2} (ref >59)
Globulin, Total: 2.4 g/dL (ref 1.5–4.5)
Glucose: 81 mg/dL (ref 65–99)
Potassium: 4 mmol/L (ref 3.5–5.2)
Sodium: 140 mmol/L (ref 134–144)
Total Protein: 6.2 g/dL (ref 6.0–8.5)

## 2020-07-06 LAB — AFP, SERUM, OPEN SPINA BIFIDA
AFP MoM: 1.08
AFP Value: 20.7 ng/mL
Gest. Age on Collection Date: 15.1 weeks
Maternal Age At EDD: 32.2 yr
OSBR Risk 1 IN: 9540
Test Results:: NEGATIVE
Weight: 348 [lb_av]

## 2020-07-06 LAB — HCV INTERPRETATION

## 2020-07-06 LAB — URINE CULTURE: Organism ID, Bacteria: NO GROWTH

## 2020-07-06 LAB — GC/CHLAMYDIA PROBE AMP
Chlamydia trachomatis, NAA: NEGATIVE
Neisseria Gonorrhoeae by PCR: NEGATIVE

## 2020-07-06 LAB — HEMOGLOBIN A1C
Est. average glucose Bld gHb Est-mCnc: 103 mg/dL
Hgb A1c MFr Bld: 5.2 % (ref 4.8–5.6)

## 2020-07-06 LAB — PROTEIN / CREATININE RATIO, URINE
Creatinine, Urine: 268 mg/dL
Protein, Ur: 35.4 mg/dL
Protein/Creat Ratio: 132 mg/g creat (ref 0–200)

## 2020-07-26 ENCOUNTER — Encounter: Payer: Self-pay | Admitting: Obstetrics & Gynecology

## 2020-07-26 ENCOUNTER — Ambulatory Visit (INDEPENDENT_AMBULATORY_CARE_PROVIDER_SITE_OTHER): Payer: Medicaid Other

## 2020-07-26 ENCOUNTER — Other Ambulatory Visit: Payer: Self-pay

## 2020-07-26 ENCOUNTER — Ambulatory Visit (INDEPENDENT_AMBULATORY_CARE_PROVIDER_SITE_OTHER): Payer: Medicaid Other | Admitting: Obstetrics & Gynecology

## 2020-07-26 VITALS — BP 133/86 | HR 92 | Wt 349.8 lb

## 2020-07-26 DIAGNOSIS — Z1389 Encounter for screening for other disorder: Secondary | ICD-10-CM | POA: Diagnosis not present

## 2020-07-26 DIAGNOSIS — Z331 Pregnant state, incidental: Secondary | ICD-10-CM

## 2020-07-26 DIAGNOSIS — Z363 Encounter for antenatal screening for malformations: Secondary | ICD-10-CM | POA: Diagnosis not present

## 2020-07-26 DIAGNOSIS — O99212 Obesity complicating pregnancy, second trimester: Secondary | ICD-10-CM

## 2020-07-26 DIAGNOSIS — O0992 Supervision of high risk pregnancy, unspecified, second trimester: Secondary | ICD-10-CM

## 2020-07-26 DIAGNOSIS — E669 Obesity, unspecified: Secondary | ICD-10-CM

## 2020-07-26 DIAGNOSIS — Z3A18 18 weeks gestation of pregnancy: Secondary | ICD-10-CM

## 2020-07-26 DIAGNOSIS — R8290 Unspecified abnormal findings in urine: Secondary | ICD-10-CM

## 2020-07-26 DIAGNOSIS — I10 Essential (primary) hypertension: Secondary | ICD-10-CM

## 2020-07-26 LAB — POCT URINALYSIS DIPSTICK OB
Glucose, UA: NEGATIVE
Ketones, UA: NEGATIVE
Leukocytes, UA: NEGATIVE
Nitrite, UA: POSITIVE
POC,PROTEIN,UA: NEGATIVE

## 2020-07-26 MED ORDER — ASPIRIN EC 81 MG PO TBEC: 162.0000 mg | DELAYED_RELEASE_TABLET | Freq: Every day | ORAL | 11 refills | Status: DC

## 2020-07-26 NOTE — Progress Notes (Signed)
[place] 18+2 wks,cephalic,anterior placenta gr 0,normal ovaries,cx 3 cm,fhr 157 bpm,svp of fluid 3.6 cm,LVEICF 1.9 mm,EFW 220 g 29%,limited ultrasound because of pt body habitus,please have pt come back for additional images

## 2020-07-26 NOTE — Progress Notes (Signed)
LOW-RISK PREGNANCY VISIT Patient name: [name] MRN [id number]  Date of birth: Oct 30, 1986 Chief Complaint:   High Risk Gestation ([place] today)  History of Present Illness:   [name] is a 34 y.o. G10P1001 female at [redacted]w[redacted]d with an Estimated Date of Delivery: 12/25/20 being seen today for ongoing management of a low-risk pregnancy.  Depression screen [facility] 2/9 07/04/2020 08/31/2018 08/18/2017 09/24/2016 09/07/2016  Decreased Interest 2 0 0 0 2  Down, Depressed, Hopeless 1 0 0 0 1  PHQ - 2 Score 3 0 0 0 3  Altered sleeping 0 - - - 3  Tired, decreased energy 2 - - - 3  Change in appetite 0 - - - 3  Feeling bad or failure about yourself  0 - - - 0  Trouble concentrating 0 - - - 0  Moving slowly or fidgety/restless 0 - - - 0  Suicidal thoughts 0 - - - 0  PHQ-9 Score 5 - - - 12  Difficult doing work/chores - - - - Somewhat difficult  Some recent data might be hidden    Today she reports no complaints. Contractions: Not present. Vag. Bleeding: None.  Movement: Present. denies leaking of fluid. Review of Systems:   Pertinent items are noted in HPI Denies abnormal vaginal discharge w/ itching/odor/irritation, headaches, visual changes, shortness of breath, chest pain, abdominal pain, severe nausea/vomiting, or problems with urination or bowel movements unless otherwise stated above. Pertinent History Reviewed:  Reviewed past medical,surgical, social, obstetrical and family history.  Reviewed problem list, medications and allergies. Physical Assessment:   Vitals:   07/26/20 1004  BP: 133/86  Pulse: 92  Weight: (!) 349 lb 12.8 oz (158.7 kg)  Body mass index is 56.46 kg/m.        Physical Examination:   General appearance: Well appearing, and in no distress  Mental status: Alert, oriented to person, place, and time  Skin: Warm & dry  Cardiovascular: Normal heart rate noted  Respiratory: Normal respiratory effort, no distress  Abdomen: Soft, gravid, nontender  Pelvic: Cervical exam  deferred         Extremities: Edema: Trace  Fetal Status:     Movement: Present    Chaperone: n/a    Results for orders placed or performed in visit on 07/26/20 (from the past 24 hour(s))  POC Urinalysis Dipstick OB   Collection Time: 07/26/20 10:04 AM  Result Value Ref Range   Color, UA     Clarity, UA     Glucose, UA Negative Negative   Bilirubin, UA     Ketones, UA neg    Spec Grav, UA     Blood, UA trace    pH, UA     POC,PROTEIN,UA Negative Negative, Trace, Small (1+), Moderate (2+), Large (3+), 4+   Urobilinogen, UA     Nitrite, UA positive    Leukocytes, UA Negative Negative   Appearance     Odor      Assessment & Plan:  1) Low-risk pregnancy G2P1001 at [redacted]w[redacted]d with an Estimated Date of Delivery: 12/25/20   2) Suboptimal scan due to maternal habitus, repeat in 4 weeks  CHTN on labetalol 200 BID + aspirin   Meds:  Meds ordered this encounter  Medications  . aspirin EC 81 MG tablet    Sig: Take 2 tablets (162 mg total) by mouth daily. Swallow whole.    Dispense:  60 tablet    Refill:  11   Labs/procedures today:   Plan:  Continue routine obstetrical care  Next visit: prefers in person    Reviewed: Preterm labor symptoms and general obstetric precautions including but not limited to vaginal bleeding, contractions, leaking of fluid and fetal movement were reviewed in detail with the patient.  All questions were answered. Has home bp cuff. Rx faxed to . Check bp weekly, let us know if >140/90.   Follow-up: Return in about 4 weeks (around 08/23/2020) for 20 week sono, HROB.  Orders Placed This Encounter  Procedures  . Urine Culture  . POC Urinalysis Dipstick OB   Lazaro Arms  07/26/2020 10:55 AM

## 2020-07-28 LAB — URINE CULTURE

## 2020-08-20 ENCOUNTER — Encounter: Payer: Self-pay | Admitting: *Deleted

## 2020-08-20 ENCOUNTER — Other Ambulatory Visit: Payer: Self-pay | Admitting: Adult Health

## 2020-08-20 DIAGNOSIS — O0992 Supervision of high risk pregnancy, unspecified, second trimester: Secondary | ICD-10-CM

## 2020-08-23 ENCOUNTER — Other Ambulatory Visit: Payer: Self-pay | Admitting: Obstetrics & Gynecology

## 2020-08-23 DIAGNOSIS — Z362 Encounter for other antenatal screening follow-up: Secondary | ICD-10-CM

## 2020-08-26 ENCOUNTER — Ambulatory Visit (INDEPENDENT_AMBULATORY_CARE_PROVIDER_SITE_OTHER): Payer: Medicaid Other

## 2020-08-26 ENCOUNTER — Encounter: Payer: Self-pay | Admitting: Obstetrics & Gynecology

## 2020-08-26 ENCOUNTER — Ambulatory Visit (INDEPENDENT_AMBULATORY_CARE_PROVIDER_SITE_OTHER): Payer: Medicaid Other | Admitting: Obstetrics & Gynecology

## 2020-08-26 VITALS — BP 128/86 | HR 96 | Wt 354.0 lb

## 2020-08-26 DIAGNOSIS — Z3A22 22 weeks gestation of pregnancy: Secondary | ICD-10-CM | POA: Diagnosis not present

## 2020-08-26 DIAGNOSIS — Z1389 Encounter for screening for other disorder: Secondary | ICD-10-CM

## 2020-08-26 DIAGNOSIS — Z23 Encounter for immunization: Secondary | ICD-10-CM | POA: Diagnosis not present

## 2020-08-26 DIAGNOSIS — O0992 Supervision of high risk pregnancy, unspecified, second trimester: Secondary | ICD-10-CM

## 2020-08-26 DIAGNOSIS — Z362 Encounter for other antenatal screening follow-up: Secondary | ICD-10-CM | POA: Diagnosis not present

## 2020-08-26 DIAGNOSIS — I10 Essential (primary) hypertension: Secondary | ICD-10-CM

## 2020-08-26 DIAGNOSIS — Z331 Pregnant state, incidental: Secondary | ICD-10-CM

## 2020-08-26 LAB — POCT URINALYSIS DIPSTICK OB
Blood, UA: NEGATIVE
Glucose, UA: NEGATIVE
Ketones, UA: NEGATIVE
Leukocytes, UA: NEGATIVE
Nitrite, UA: NEGATIVE
POC,PROTEIN,UA: NEGATIVE

## 2020-08-26 NOTE — Progress Notes (Signed)
HIGH-RISK PREGNANCY VISIT Patient name: [name] MRN [id number]  Date of birth: 1985-12-02 Chief Complaint:   High Risk Gestation ([place] today)  History of Present Illness:   ARIYANAH Harrington is a 34 y.o. G6P1001 female at [redacted]w[redacted]d with an Estimated Date of Delivery: 12/25/20 being seen today for ongoing management of a high-risk pregnancy complicated by [facility] on labetalol 200 mg BID.  Today she reports no complaints.  Depression screen [facility] 2/9 07/04/2020 08/31/2018 08/18/2017 09/24/2016 09/07/2016  Decreased Interest 2 0 0 0 2  Down, Depressed, Hopeless 1 0 0 0 1  PHQ - 2 Score 3 0 0 0 3  Altered sleeping 0 - - - 3  Tired, decreased energy 2 - - - 3  Change in appetite 0 - - - 3  Feeling bad or failure about yourself  0 - - - 0  Trouble concentrating 0 - - - 0  Moving slowly or fidgety/restless 0 - - - 0  Suicidal thoughts 0 - - - 0  PHQ-9 Score 5 - - - 12  Difficult doing work/chores - - - - Somewhat difficult  Some recent data might be hidden    Contractions: Not present. Vag. Bleeding: None.  Movement: Present. denies leaking of fluid.  Review of Systems:   Pertinent items are noted in HPI Denies abnormal vaginal discharge w/ itching/odor/irritation, headaches, visual changes, shortness of breath, chest pain, abdominal pain, severe nausea/vomiting, or problems with urination or bowel movements unless otherwise stated above. Pertinent History Reviewed:  Reviewed past medical,surgical, social, obstetrical and family history.  Reviewed problem list, medications and allergies. Physical Assessment:   Vitals:   08/26/20 1058  BP: 128/86  Pulse: 96  Weight: (!) 354 lb (160.6 kg)  Body mass index is 57.14 kg/m.           Physical Examination:   General appearance: alert, well appearing, and in no distress  Mental status: alert, oriented to person, place, and time  Skin: warm & dry   Extremities: Edema: Trace    Cardiovascular: normal heart rate noted  Respiratory: normal  respiratory effort, no distress  Abdomen: gravid, soft, non-tender  Pelvic: Cervical exam deferred         Fetal Status: Fetal Heart Rate (bpm): 152   Movement: Present    Fetal Surveillance Testing today: sonogram follow up   Chaperone:     Results for orders placed or performed in visit on 08/26/20 (from the past 24 hour(s))  POC Urinalysis Dipstick OB   Collection Time: 08/26/20 11:00 AM  Result Value Ref Range   Color, UA     Clarity, UA     Glucose, UA Negative Negative   Bilirubin, UA     Ketones, UA neg    Spec Grav, UA     Blood, UA neg    pH, UA     POC,PROTEIN,UA Negative Negative, Trace, Small (1+), Moderate (2+), Large (3+), 4+   Urobilinogen, UA     Nitrite, UA neg    Leukocytes, UA Negative Negative   Appearance     Odor      Assessment & Plan:  1) High-risk pregnancy G2P1001 at [redacted]w[redacted]d with an Estimated Date of Delivery: 12/25/20   2) CHTN, on labetalol 200 mg BID  3) Depression/anxiety, cymbalta  Meds: No orders of the defined types were placed in this encounter.   Labs/procedures today:   Treatment Plan:  PN2 at next visit  Reviewed: Preterm labor symptoms and general obstetric  precautions including but not limited to vaginal bleeding, contractions, leaking of fluid and fetal movement were reviewed in detail with the patient.  All questions were answered. Has home bp cuff. Rx faxed to  Check bp weekly, let us know if >140/90.   Follow-up: Return in about 4 weeks (around 09/23/2020) for PN2, HROB.  Orders Placed This Encounter  Procedures  . Flu Vaccine QUAD 36+ mos IM  . POC Urinalysis Dipstick OB   Amaryllis Dyke Brinley Treanor  08/26/2020 11:43 AM

## 2020-08-26 NOTE — Progress Notes (Signed)
[place] 22+5 wks,breech,anterior placenta gr 0,normal ovaries,cx 4.5 cm,SVP of fluid 6.7 cm,fhr 152 bpm,LVEICF 1.9 mm,EFW 488 g 22%,anatomy complete,limited view because of pt body habitus

## 2020-09-09 ENCOUNTER — Telehealth: Payer: Self-pay

## 2020-09-09 NOTE — Telephone Encounter (Signed)
Spoke with patient states that she started having vomiting and diarrhea this morning. Has meds for nausea. Recommended pushing fluids and taking nausea meds as needed. Pt has no other questions at this time.

## 2020-09-09 NOTE — Telephone Encounter (Signed)
Pt is having stabbing stomach pain, no bleeding, vomiting and diarrhea

## 2020-09-23 ENCOUNTER — Ambulatory Visit (INDEPENDENT_AMBULATORY_CARE_PROVIDER_SITE_OTHER): Payer: Medicaid Other | Admitting: Women's Health

## 2020-09-23 ENCOUNTER — Other Ambulatory Visit: Payer: Medicaid Other

## 2020-09-23 VITALS — BP 136/82 | HR 107 | Wt 357.6 lb

## 2020-09-23 DIAGNOSIS — I10 Essential (primary) hypertension: Secondary | ICD-10-CM

## 2020-09-23 DIAGNOSIS — Z3A26 26 weeks gestation of pregnancy: Secondary | ICD-10-CM

## 2020-09-23 DIAGNOSIS — O0992 Supervision of high risk pregnancy, unspecified, second trimester: Secondary | ICD-10-CM

## 2020-09-23 DIAGNOSIS — R8271 Bacteriuria: Secondary | ICD-10-CM | POA: Insufficient documentation

## 2020-09-23 DIAGNOSIS — Z23 Encounter for immunization: Secondary | ICD-10-CM

## 2020-09-23 DIAGNOSIS — Z331 Pregnant state, incidental: Secondary | ICD-10-CM

## 2020-09-23 DIAGNOSIS — Z1389 Encounter for screening for other disorder: Secondary | ICD-10-CM

## 2020-09-23 LAB — POCT URINALYSIS DIPSTICK OB
Blood, UA: NEGATIVE
Glucose, UA: NEGATIVE
Leukocytes, UA: NEGATIVE
Nitrite, UA: POSITIVE
POC,PROTEIN,UA: NEGATIVE

## 2020-09-23 MED ORDER — NITROFURANTOIN MONOHYD MACRO 100 MG PO CAPS
100.0000 mg | ORAL_CAPSULE | Freq: Two times a day (BID) | ORAL | 0 refills | Status: DC
Start: 2020-09-23 — End: 2020-10-21

## 2020-09-23 NOTE — Addendum Note (Signed)
Addended by: Leilani Able, Karter Haire A on: 09/23/2020 01:34 PM   Modules accepted: Orders

## 2020-09-23 NOTE — Addendum Note (Signed)
Addended by: Shawna Clamp R on: 09/23/2020 01:01 PM   Modules accepted: Orders

## 2020-09-23 NOTE — Progress Notes (Addendum)
HIGH-RISK PREGNANCY VISIT Patient name: [name] MRN [id number]  Date of birth: March 05, 1986 Chief Complaint:   Routine Prenatal Visit  History of Present Illness:   [name] is a 34 y.o. G18P1001 female at [redacted]w[redacted]d with an Estimated Date of Delivery: 12/25/20 being seen today for ongoing management of a high-risk pregnancy complicated by chronic hypertension currently on Labetalol 200mg  BID. Today she reports n/v x 2days, has phenergan at home that helps. Oxycodone 7.5mg  1 q 1-3 days for chronic low back pain from Dr. . Wants BTL Depression screen [facility] 2/9 09/23/2020 07/04/2020 08/31/2018 08/18/2017 09/24/2016  Decreased Interest 1 2 0 0 0  Down, Depressed, Hopeless 0 1 0 0 0  PHQ - 2 Score 1 3 0 0 0  Altered sleeping 1 0 - - -  Tired, decreased energy 0 2 - - -  Change in appetite 0 0 - - -  Feeling bad or failure about yourself  0 0 - - -  Trouble concentrating 0 0 - - -  Moving slowly or fidgety/restless 1 0 - - -  Suicidal thoughts 0 0 - - -  PHQ-9 Score 3 5 - - -  Difficult doing work/chores - - - - -  Some recent data might be hidden    Contractions: Not present. Vag. Bleeding: None.  Movement: Present. denies leaking of fluid.  Review of Systems:   Pertinent items are noted in HPI Denies abnormal vaginal discharge w/ itching/odor/irritation, headaches, visual changes, shortness of breath, chest pain, abdominal pain, severe nausea/vomiting, or problems with urination or bowel movements unless otherwise stated above. Pertinent History Reviewed:  Reviewed past medical,surgical, social, obstetrical and family history.  Reviewed problem list, medications and allergies. Physical Assessment:   Vitals:   09/23/20 0908  BP: 136/82  Pulse: (!) 107  Weight: (!) 357 lb 9.6 oz (162.2 kg)  Body mass index is 57.72 kg/m.           Physical Examination:   General appearance: alert, well appearing, and in no distress  Mental status: alert, oriented to person, place, and  time  Skin: warm & dry   Extremities: Edema: Trace    Cardiovascular: normal heart rate noted  Respiratory: normal respiratory effort, no distress  Abdomen: gravid, soft, non-tender  Pelvic: Cervical exam deferred         Fetal Status: Fetal Heart Rate (bpm): 160 Fundal Height: 22 cm Movement: Present    Fetal Surveillance Testing today: doppler   Chaperone: N/A    Results for orders placed or performed in visit on 09/23/20 (from the past 24 hour(s))  POC Urinalysis Dipstick OB   Collection Time: 09/23/20  9:08 AM  Result Value Ref Range   Color, UA     Clarity, UA     Glucose, UA Negative Negative   Bilirubin, UA     Ketones, UA large    Spec Grav, UA     Blood, UA neg    pH, UA     POC,PROTEIN,UA Negative Negative, Trace, Small (1+), Moderate (2+), Large (3+), 4+   Urobilinogen, UA     Nitrite, UA positive    Leukocytes, UA Negative Negative   Appearance     Odor      Assessment & Plan:  1) High-risk pregnancy G2P1001 at [redacted]w[redacted]d with an Estimated Date of Delivery: 12/25/20   2) CHTN, stable on Labetalol 200mg  BID, ASA, last EFW @ 22.5wks 22%  3) Chronic long-term opiate use, oxycodone 7.5mg , is  down to 1 q 1-3 days  4) Wants BTL> reviewed risks/benefits, LARCs just as effective, consent signed today   5) ASB> rx macrobid, send cx  Meds: No orders of the defined types were placed in this encounter.   Labs/procedures today: pn2, tdap  Treatment Plan:  Growth u/s q4wks    2x/wk testing nst/sono @ 32wks     Deliver 38-39wks (37wks or prn if poor control)____   Reviewed: Preterm labor symptoms and general obstetric precautions including but not limited to vaginal bleeding, contractions, leaking of fluid and fetal movement were reviewed in detail with the patient.  All questions were answered. Has home bp cuff. Check bp weekly, let us know if >140/90.   Follow-up: Return for ASAP, US:EFW (no visit), then 4wks HROB w/ EFW u/s w/ CNM or MD, Sign BTL consent today.  Orders  Placed This Encounter  Procedures  . US OB Follow Up  . Tdap vaccine greater than or equal to 7yo IM  . POC Urinalysis Dipstick OB   Cheral Marker CNM, [facility] 09/23/2020 10:15 AM

## 2020-09-23 NOTE — Patient Instructions (Signed)
[name], I greatly value your feedback.  If you receive a survey following your visit with [place] today, we appreciate you taking the time to fill it out.  Thanks, [name], CNM, WHNP-BC   [facility] ([address] [place], [place] [address]) Entrance C, located off of [address] valet parking  Go to [email or web address] to register for FREE online childbirth classes   Call the office [phone]) or go to [facility] if:  You begin to have strong, frequent contractions  Your water breaks.  Sometimes it is a big gush of fluid, sometimes it is just a trickle that keeps getting your panties wet or running down your legs  You have vaginal bleeding.  It is normal to have a small amount of spotting if your cervix was checked.   You don't feel your baby moving like normal.  If you don't, get you something to eat and drink and lay down and focus on feeling your baby move.  You should feel at least 10 movements in 2 hours.  If you don't, you should call the office or go to [facility].    Tdap Vaccine  It is recommended that you get the Tdap vaccine during the third trimester of EACH pregnancy to help protect your baby from getting pertussis (whooping cough)  27-36 weeks is the BEST time to do this so that you can pass the protection on to your baby. During pregnancy is better than after pregnancy, but if you are unable to get it during pregnancy it will be offered at the hospital.   You can get this vaccine with [place], at the health department, your family doctor, or some local pharmacies  Everyone who will be around your baby should also be up-to-date on their vaccines before the baby comes. Adults (who are not pregnant) only need 1 dose of Tdap during adulthood.   [place] Pediatricians/Family Doctors:  [facility] [phone]            [facility] [phone]                 [facility]  [phone] (usually not accepting new patients unless you have family there already, you are always welcome to call and ask)       [facility] Department [phone]       [facility] Pediatricians/Family Doctors:   [facility]: [phone]  [facility]: [phone]  [facility]: [phone]  [facility] Doctors:   [facility]: [phone]   [facility]: [phone]  [facility] Doctors:  [facility]: [phone]   Home Blood Pressure Monitoring for Patients   Your provider has recommended that you check your blood pressure (BP) at least once a week at home. If you do not have a blood pressure cuff at home, one will be provided for you. Contact your provider if you have not received your monitor within 1 week.   Helpful Tips for Accurate Home Blood Pressure Checks  . Don't smoke, exercise, or drink caffeine 30 minutes before checking your BP . Use the restroom before checking your BP (a full bladder can raise your pressure) . Relax in a comfortable upright chair . Feet on the ground . Left arm resting comfortably on a flat surface at the level of your heart . Legs uncrossed . Back supported . Sit quietly and don't talk . Place the cuff on your  bare arm . Adjust snuggly, so that only two fingertips can fit between your skin and the top of the cuff . Check 2 readings separated by at least one minute . Keep a log of your BP readings . For a visual, please reference this diagram: http://ccnc.care/bpdiagram  Provider Name: [facility]     Phone: [phone]  Zone 1: ALL CLEAR  Continue to monitor your symptoms:  . BP reading is less than 140 (top number) or less than 90 (bottom number)  . No right upper stomach pain . No headaches or seeing spots . No feeling nauseated or throwing up . No swelling in face and hands  Zone 2: CAUTION Call your  doctor's office for any of the following:  . BP reading is greater than 140 (top number) or greater than 90 (bottom number)  . Stomach pain under your ribs in the middle or right side . Headaches or seeing spots . Feeling nauseated or throwing up . Swelling in face and hands  Zone 3: EMERGENCY  Seek immediate medical care if you have any of the following:  . BP reading is greater than160 (top number) or greater than 110 (bottom number) . Severe headaches not improving with Tylenol . Serious difficulty catching your breath . Any worsening symptoms from Zone 2   Third Trimester of Pregnancy The third trimester is from week 29 through week 42, months 7 through 9. The third trimester is a time when the fetus is growing rapidly. At the end of the ninth month, the fetus is about 20 inches in length and weighs 6-10 pounds.  BODY CHANGES Your body goes through many changes during pregnancy. The changes vary from woman to woman.   Your weight will continue to increase. You can expect to gain 25-35 pounds (11-16 kg) by the end of the pregnancy.  You may begin to get stretch marks on your hips, abdomen, and breasts.  You may urinate more often because the fetus is moving lower into your pelvis and pressing on your bladder.  You may develop or continue to have heartburn as a result of your pregnancy.  You may develop constipation because certain hormones are causing the muscles that push waste through your intestines to slow down.  You may develop hemorrhoids or swollen, bulging veins (varicose veins).  You may have pelvic pain because of the weight gain and pregnancy hormones relaxing your joints between the bones in your pelvis. Backaches may result from overexertion of the muscles supporting your posture.  You may have changes in your hair. These can include thickening of your hair, rapid growth, and changes in texture. Some women also have hair loss during or after pregnancy, or hair that  feels dry or thin. Your hair will most likely return to normal after your baby is born.  Your breasts will continue to grow and be tender. A yellow discharge may leak from your breasts called colostrum.  Your belly button may stick out.  You may feel short of breath because of your expanding uterus.  You may notice the fetus "dropping," or moving lower in your abdomen.  You may have a bloody mucus discharge. This usually occurs a few days to a week before labor begins.  Your cervix becomes thin and soft (effaced) near your due date. WHAT TO EXPECT AT YOUR PRENATAL EXAMS  You will have prenatal exams every 2 weeks until week 36. Then, you will have weekly prenatal exams. During a routine prenatal visit:  You will be weighed to make sure you and the fetus are growing normally.  Your blood pressure is taken.  Your abdomen will be measured to track your baby's growth.  The fetal heartbeat will be listened to.  Any test results from the previous visit will be discussed.  You may have a cervical check near your due date to see if you have effaced. At around 36 weeks, your caregiver will check your cervix. At the same time, your caregiver will also perform a test on the secretions of the vaginal tissue. This test is to determine if a type of bacteria, Group B streptococcus, is present. Your caregiver will explain this further. Your caregiver may ask you:  What your birth plan is.  How you are feeling.  If you are feeling the baby move.  If you have had any abnormal symptoms, such as leaking fluid, bleeding, severe headaches, or abdominal cramping.  If you have any questions. Other tests or screenings that may be performed during your third trimester include:  Blood tests that check for low iron levels (anemia).  Fetal testing to check the health, activity level, and growth of the fetus. Testing is done if you have certain medical conditions or if there are problems during the  pregnancy. FALSE LABOR You may feel small, irregular contractions that eventually go away. These are called Braxton Hicks contractions, or false labor. Contractions may last for hours, days, or even weeks before true labor sets in. If contractions come at regular intervals, intensify, or become painful, it is best to be seen by your caregiver.  SIGNS OF LABOR   Menstrual-like cramps.  Contractions that are 5 minutes apart or less.  Contractions that start on the top of the uterus and spread down to the lower abdomen and back.  A sense of increased pelvic pressure or back pain.  A watery or bloody mucus discharge that comes from the vagina. If you have any of these signs before the 37th week of pregnancy, call your caregiver right away. You need to go to the hospital to get checked immediately. HOME CARE INSTRUCTIONS   Avoid all smoking, herbs, alcohol, and unprescribed drugs. These chemicals affect the formation and growth of the baby.  Follow your caregiver's instructions regarding medicine use. There are medicines that are either safe or unsafe to take during pregnancy.  Exercise only as directed by your caregiver. Experiencing uterine cramps is a good sign to stop exercising.  Continue to eat regular, healthy meals.  Wear a good support bra for breast tenderness.  Do not use hot tubs, steam rooms, or saunas.  Wear your seat belt at all times when driving.  Avoid raw meat, uncooked cheese, cat litter boxes, and soil used by cats. These carry germs that can cause birth defects in the baby.  Take your prenatal vitamins.  Try taking a stool softener (if your caregiver approves) if you develop constipation. Eat more high-fiber foods, such as fresh vegetables or fruit and whole grains. Drink plenty of fluids to keep your urine clear or pale yellow.  Take warm sitz baths to soothe any pain or discomfort caused by hemorrhoids. Use hemorrhoid cream if your caregiver approves.  If you  develop varicose veins, wear support hose. Elevate your feet for 15 minutes, 3-4 times a day. Limit salt in your diet.  Avoid heavy lifting, wear low heal shoes, and practice good posture.  Rest a lot with your legs elevated if you have leg cramps or low  back pain.  Visit your dentist if you have not gone during your pregnancy. Use a soft toothbrush to brush your teeth and be gentle when you floss.  A sexual relationship may be continued unless your caregiver directs you otherwise.  Do not travel far distances unless it is absolutely necessary and only with the approval of your caregiver.  Take prenatal classes to understand, practice, and ask questions about the labor and delivery.  Make a trial run to the hospital.  Pack your hospital bag.  Prepare the baby's nursery.  Continue to go to all your prenatal visits as directed by your caregiver. SEEK MEDICAL CARE IF:  You are unsure if you are in labor or if your water has broken.  You have dizziness.  You have mild pelvic cramps, pelvic pressure, or nagging pain in your abdominal area.  You have persistent nausea, vomiting, or diarrhea.  You have a bad smelling vaginal discharge.  You have pain with urination. SEEK IMMEDIATE MEDICAL CARE IF:   You have a fever.  You are leaking fluid from your vagina.  You have spotting or bleeding from your vagina.  You have severe abdominal cramping or pain.  You have rapid weight loss or gain.  You have shortness of breath with chest pain.  You notice sudden or extreme swelling of your face, hands, ankles, feet, or legs.  You have not felt your baby move in over an hour.  You have severe headaches that do not go away with medicine.  You have vision changes. Document Released: 10/27/2001 Document Revised: 11/07/2013 Document Reviewed: 01/03/2013 [facility] Patient Information 2015 [place], [place]. This information is not intended to replace advice given to you by your health  care provider. Make sure you discuss any questions you have with your health care provider.

## 2020-09-24 LAB — CBC
Hematocrit: 37.2 % (ref 34.0–46.6)
Hemoglobin: 12.4 g/dL (ref 11.1–15.9)
MCH: 29.7 pg (ref 26.6–33.0)
MCHC: 33.3 g/dL (ref 31.5–35.7)
MCV: 89 fL (ref 79–97)
Platelets: 256 10*3/uL (ref 150–450)
RBC: 4.18 x10E6/uL (ref 3.77–5.28)
RDW: 13.2 % (ref 11.7–15.4)
WBC: 12.2 10*3/uL — ABNORMAL HIGH (ref 3.4–10.8)

## 2020-09-24 LAB — GLUCOSE TOLERANCE, 2 HOURS W/ 1HR
Glucose, 1 hour: 163 mg/dL (ref 65–179)
Glucose, 2 hour: 117 mg/dL (ref 65–152)
Glucose, Fasting: 77 mg/dL (ref 65–91)

## 2020-09-24 LAB — ANTIBODY SCREEN: Antibody Screen: NEGATIVE

## 2020-09-24 LAB — RPR: RPR Ser Ql: NONREACTIVE

## 2020-09-24 LAB — HIV ANTIBODY (ROUTINE TESTING W REFLEX): HIV Screen 4th Generation wRfx: NONREACTIVE

## 2020-09-25 ENCOUNTER — Ambulatory Visit (INDEPENDENT_AMBULATORY_CARE_PROVIDER_SITE_OTHER): Payer: Medicaid Other

## 2020-09-25 DIAGNOSIS — O0992 Supervision of high risk pregnancy, unspecified, second trimester: Secondary | ICD-10-CM | POA: Diagnosis not present

## 2020-09-25 DIAGNOSIS — I10 Essential (primary) hypertension: Secondary | ICD-10-CM | POA: Diagnosis not present

## 2020-09-25 DIAGNOSIS — Z3A27 27 weeks gestation of pregnancy: Secondary | ICD-10-CM

## 2020-09-25 NOTE — Progress Notes (Signed)
[place] 27 wks,transverse head right,anterior placenta gr 2,cx 4.4 cm,afi 16 cm,FHR 171 bpm,EFW 952 g 23%,limited view because of pt body habitus

## 2020-09-26 LAB — URINE CULTURE

## 2020-10-17 ENCOUNTER — Other Ambulatory Visit: Payer: Self-pay | Admitting: Women's Health

## 2020-10-17 DIAGNOSIS — O10919 Unspecified pre-existing hypertension complicating pregnancy, unspecified trimester: Secondary | ICD-10-CM

## 2020-10-21 ENCOUNTER — Ambulatory Visit (INDEPENDENT_AMBULATORY_CARE_PROVIDER_SITE_OTHER): Payer: Medicaid Other

## 2020-10-21 ENCOUNTER — Other Ambulatory Visit: Payer: Self-pay

## 2020-10-21 ENCOUNTER — Encounter: Payer: Self-pay | Admitting: Obstetrics & Gynecology

## 2020-10-21 ENCOUNTER — Ambulatory Visit (INDEPENDENT_AMBULATORY_CARE_PROVIDER_SITE_OTHER): Payer: Medicaid Other | Admitting: Obstetrics & Gynecology

## 2020-10-21 VITALS — BP 139/92 | HR 114 | Wt 357.2 lb

## 2020-10-21 DIAGNOSIS — O10919 Unspecified pre-existing hypertension complicating pregnancy, unspecified trimester: Secondary | ICD-10-CM | POA: Diagnosis not present

## 2020-10-21 DIAGNOSIS — I10 Essential (primary) hypertension: Secondary | ICD-10-CM

## 2020-10-21 DIAGNOSIS — Z3A30 30 weeks gestation of pregnancy: Secondary | ICD-10-CM | POA: Diagnosis not present

## 2020-10-21 DIAGNOSIS — Z331 Pregnant state, incidental: Secondary | ICD-10-CM

## 2020-10-21 DIAGNOSIS — Z1389 Encounter for screening for other disorder: Secondary | ICD-10-CM

## 2020-10-21 DIAGNOSIS — O0993 Supervision of high risk pregnancy, unspecified, third trimester: Secondary | ICD-10-CM

## 2020-10-21 DIAGNOSIS — O0992 Supervision of high risk pregnancy, unspecified, second trimester: Secondary | ICD-10-CM

## 2020-10-21 LAB — POCT URINALYSIS DIPSTICK OB
Blood, UA: NEGATIVE
Glucose, UA: NEGATIVE
Ketones, UA: NEGATIVE
Leukocytes, UA: NEGATIVE
Nitrite, UA: NEGATIVE

## 2020-10-21 NOTE — Progress Notes (Signed)
[place] 30+5 wks,cephalic,anterior placenta gr 2,afi 13.6 cm,fhr 161 bpm,EFW 1533 g 23%

## 2020-10-21 NOTE — Progress Notes (Signed)
HIGH-RISK PREGNANCY VISIT Patient name: [name] MRN [id number]  Date of birth: 12-26-1985 Chief Complaint:   High Risk Gestation ([place] today; noticed blood after BM this am)  History of Present Illness:   [name] is a 34 y.o. G48P1001 female at [redacted]w[redacted]d with an Estimated Date of Delivery: 12/25/20 being seen today for ongoing management of a high-risk pregnancy complicated by chronic hypertension currently on labetalol 200 mg BID.  Today she reports no complaints.  Depression screen [facility] 2/9 09/23/2020 07/04/2020 08/31/2018 08/18/2017 09/24/2016  Decreased Interest 1 2 0 0 0  Down, Depressed, Hopeless 0 1 0 0 0  PHQ - 2 Score 1 3 0 0 0  Altered sleeping 1 0 - - -  Tired, decreased energy 0 2 - - -  Change in appetite 0 0 - - -  Feeling bad or failure about yourself  0 0 - - -  Trouble concentrating 0 0 - - -  Moving slowly or fidgety/restless 1 0 - - -  Suicidal thoughts 0 0 - - -  PHQ-9 Score 3 5 - - -  Difficult doing work/chores - - - - -  Some recent data might be hidden    Contractions: Not present. Vag. Bleeding: None.  Movement: Present. denies leaking of fluid.  Review of Systems:   Pertinent items are noted in HPI Denies abnormal vaginal discharge w/ itching/odor/irritation, headaches, visual changes, shortness of breath, chest pain, abdominal pain, severe nausea/vomiting, or problems with urination or bowel movements unless otherwise stated above. Pertinent History Reviewed:  Reviewed past medical,surgical, social, obstetrical and family history.  Reviewed problem list, medications and allergies. Physical Assessment:   Vitals:   10/21/20 1032  BP: (!) 139/92  Pulse: (!) 114  Weight: (!) 357 lb 3.2 oz (162 kg)  Body mass index is 57.65 kg/m.           Physical Examination:   General appearance: alert, well appearing, and in no distress  Mental status: alert, oriented to person, place, and time  Skin: warm & dry   Extremities: Edema: Trace    Cardiovascular:  normal heart rate noted  Respiratory: normal respiratory effort, no distress  Abdomen: gravid, soft, non-tender  Pelvic: Cervical exam deferred         Fetal Status: Fetal Heart Rate (bpm): 154   Movement: Present    Fetal Surveillance Testing today: sonogram is normal   Chaperone: N/A    Results for orders placed or performed in visit on 10/21/20 (from the past 24 hour(s))  POC Urinalysis Dipstick OB   Collection Time: 10/21/20 10:17 AM  Result Value Ref Range   Color, UA     Clarity, UA     Glucose, UA Negative Negative   Bilirubin, UA     Ketones, UA neg    Spec Grav, UA     Blood, UA neg    pH, UA     POC,PROTEIN,UA Trace Negative, Trace, Small (1+), Moderate (2+), Large (3+), 4+   Urobilinogen, UA     Nitrite, UA neg    Leukocytes, UA Negative Negative   Appearance     Odor      Assessment & Plan:  High-risk pregnancy: G2P1001 at [redacted]w[redacted]d with an Estimated Date of Delivery: 12/25/20   1) CHTN, stable, BP well controlled at home, normal growth percentile  2) normal glucola,   Meds: No orders of the defined types were placed in this encounter.   Labs/procedures today: sonogrm  Treatment Plan:  Begin twice weekly surveillance  Reviewed: preterm labor symptoms and general obstetric precautions including but not limited to vaginal bleeding, contractions, leaking of fluid and fetal movement were reviewed in detail with the patient.  All questions were answered. Has home bp cuff. Rx faxed to . Check bp weekly, let us know if >140/90.   Follow-up: Return in about 10 days (around 10/31/2020) for BPP/sono, HROB, pt will be NST mondays, BPP thursday after the 16th).   No future appointments.  Orders Placed This Encounter  Procedures  . POC Urinalysis Dipstick OB   Lazaro Arms  10/21/2020 10:56 AM

## 2020-10-29 ENCOUNTER — Other Ambulatory Visit: Payer: Self-pay | Admitting: Women's Health

## 2020-10-29 DIAGNOSIS — O0993 Supervision of high risk pregnancy, unspecified, third trimester: Secondary | ICD-10-CM

## 2020-10-29 DIAGNOSIS — I10 Essential (primary) hypertension: Secondary | ICD-10-CM

## 2020-10-30 ENCOUNTER — Other Ambulatory Visit: Payer: Self-pay | Admitting: Obstetrics & Gynecology

## 2020-10-30 DIAGNOSIS — O10919 Unspecified pre-existing hypertension complicating pregnancy, unspecified trimester: Secondary | ICD-10-CM

## 2020-10-31 ENCOUNTER — Ambulatory Visit (INDEPENDENT_AMBULATORY_CARE_PROVIDER_SITE_OTHER): Payer: Medicaid Other | Admitting: Advanced Practice Midwife

## 2020-10-31 ENCOUNTER — Ambulatory Visit (INDEPENDENT_AMBULATORY_CARE_PROVIDER_SITE_OTHER): Payer: Medicaid Other

## 2020-10-31 ENCOUNTER — Other Ambulatory Visit: Payer: Self-pay

## 2020-10-31 ENCOUNTER — Other Ambulatory Visit: Payer: Self-pay | Admitting: *Deleted

## 2020-10-31 VITALS — BP 133/78 | HR 94 | Wt 358.4 lb

## 2020-10-31 DIAGNOSIS — O10919 Unspecified pre-existing hypertension complicating pregnancy, unspecified trimester: Secondary | ICD-10-CM

## 2020-10-31 DIAGNOSIS — O0993 Supervision of high risk pregnancy, unspecified, third trimester: Secondary | ICD-10-CM

## 2020-10-31 DIAGNOSIS — I10 Essential (primary) hypertension: Secondary | ICD-10-CM

## 2020-10-31 DIAGNOSIS — Z331 Pregnant state, incidental: Secondary | ICD-10-CM

## 2020-10-31 DIAGNOSIS — O0992 Supervision of high risk pregnancy, unspecified, second trimester: Secondary | ICD-10-CM

## 2020-10-31 DIAGNOSIS — Z1389 Encounter for screening for other disorder: Secondary | ICD-10-CM

## 2020-10-31 DIAGNOSIS — Z3A32 32 weeks gestation of pregnancy: Secondary | ICD-10-CM

## 2020-10-31 LAB — POCT URINALYSIS DIPSTICK OB
Blood, UA: NEGATIVE
Glucose, UA: NEGATIVE
Ketones, UA: NEGATIVE
Leukocytes, UA: NEGATIVE
Nitrite, UA: NEGATIVE
POC,PROTEIN,UA: NEGATIVE

## 2020-10-31 NOTE — Patient Instructions (Signed)
Round Ligament Pain During Pregnancy   Round ligament pain is a sharp pain or jabbing feeling often felt in the lower belly or groin area on one or both sides. It is one of the most common complaints during pregnancy and is considered a normal part of pregnancy. It is most often felt during the second trimester.   Here is what you need to know about round ligament pain, including some tips to help you feel better.   Causes of Round Ligament Pain   Several thick ligaments surround and support your womb (uterus) as it grows during pregnancy. One of them is called the round ligament.   The round ligament connects the front part of the womb to your groin, the area where your legs attach to your pelvis. The round ligament normally tightens and relaxes slowly.   As your baby and womb grow, the round ligament stretches. That makes it more likely to become strained.   Sudden movements can cause the ligament to tighten quickly, like a rubber band snapping. This causes a sudden and quick jabbing feeling.   Symptoms of Round Ligament Pain   Round ligament pain can be concerning and uncomfortable. But it is considered normal as your body changes during pregnancy.   The symptoms of round ligament pain include a sharp, sudden spasm in the belly. It usually affects the right side, but it may happen on both sides. The pain only lasts a few seconds.   Exercise may cause the pain, as will rapid movements such as:  sneezing  coughing  laughing  rolling over in bed  standing up too quickly   Treatment of Round Ligament Pain   Here are some tips that may help reduce your discomfort:   Pain relief. Take over-the-counter acetaminophen for pain, if necessary. Ask your doctor if this is OK.   Exercise. Get plenty of exercise to keep your stomach (core) muscles strong. Doing stretching exercises or prenatal yoga can be helpful. Ask your doctor which exercises are safe for you and your baby.   A helpful  exercise involves putting your hands and knees on the floor, lowering your head, and pushing your backside into the air.   Avoid sudden movements. Change positions slowly (such as standing up or sitting down) to avoid sudden movements that may cause stretching and pain.   Flex your hips. Bend and flex your hips before you cough, sneeze, or laugh to avoid pulling on the ligaments.   Apply warmth. A heating pad or warm bath may be helpful. Ask your doctor if this is OK. Extreme heat can be dangerous to the baby.   You should try to modify your daily activity level and avoid positions that may worsen the condition.   When to Call the Doctor/Midwife   Always tell your doctor or midwife about any type of pain you have during pregnancy. Round ligament pain is quick and doesn't last long.   Call your health care provider immediately if you have:  severe pain  fever  chills  pain on urination  difficulty walking   Belly pain during pregnancy can be due to many different causes. It is important for your doctor to rule out more serious conditions, including pregnancy complications such as placenta abruption or non-pregnancy illnesses such as:  inguinal hernia  appendicitis  stomach, liver, and kidney problems  Preterm labor pains may sometimes be mistaken for round ligament pain.      [name], I greatly value your  feedback.  If you receive a survey following your visit with [place] today, we appreciate you taking the time to fill it out.  Thanks, Cathie Beams, DNP, CNM  [facility] HAS MOVED!!! It is now [facility] ([address] [place], [place] [address]) Entrance located off of [address] Free 24/7 valet parking   Go to [email or web address] to register for FREE online childbirth classes    Call the office [phone]) or go to [facility] if:  You begin to have strong, frequent contractions  Your water breaks.  Sometimes  it is a big gush of fluid, sometimes it is just a trickle that keeps getting your panties wet or running down your legs  You have vaginal bleeding.  It is normal to have a small amount of spotting if your cervix was checked.   You don't feel your baby moving like normal.  If you don't, get you something to eat and drink and lay down and focus on feeling your baby move.  You should feel at least 10 movements in 2 hours.  If you don't, you should call the office or go to [facility].   Home Blood Pressure Monitoring for Patients   Your provider has recommended that you check your blood pressure (BP) at least once a week at home. If you do not have a blood pressure cuff at home, one will be provided for you. Contact your provider if you have not received your monitor within 1 week.   Helpful Tips for Accurate Home Blood Pressure Checks  . Don't smoke, exercise, or drink caffeine 30 minutes before checking your BP . Use the restroom before checking your BP (a full bladder can raise your pressure) . Relax in a comfortable upright chair . Feet on the ground . Left arm resting comfortably on a flat surface at the level of your heart . Legs uncrossed . Back supported . Sit quietly and don't talk . Place the cuff on your bare arm . Adjust snuggly, so that only two fingertips can fit between your skin and the top of the cuff . Check 2 readings separated by at least one minute . Keep a log of your BP readings . For a visual, please reference this diagram: http://ccnc.care/bpdiagram  Provider Name: [facility]     Phone: [phone]  Zone 1: ALL CLEAR  Continue to monitor your symptoms:  . BP reading is less than 140 (top number) or less than 90 (bottom number)  . No right upper stomach pain . No headaches or seeing spots . No feeling nauseated or throwing up . No swelling in face and hands  Zone 2: CAUTION Call your doctor's office for any of the following:  . BP reading is  greater than 140 (top number) or greater than 90 (bottom number)  . Stomach pain under your ribs in the middle or right side . Headaches or seeing spots . Feeling nauseated or throwing up . Swelling in face and hands  Zone 3: EMERGENCY  Seek immediate medical care if you have any of the following:  . BP reading is greater than160 (top number) or greater than 110 (bottom number) . Severe headaches not improving with Tylenol . Serious difficulty catching your breath . Any worsening symptoms from Zone 2

## 2020-10-31 NOTE — Progress Notes (Signed)
[place] 32+1 wks,cephalic,BPP 8/8,anterior placenta gr 3,fhr 171 bpm,AFI 17 cm,RI .63,.56,.56,.71=64%,limited view

## 2020-10-31 NOTE — Progress Notes (Signed)
HIGH-RISK PREGNANCY VISIT Patient name: [name] MRN [id number]  Date of birth: 1986/04/13 Chief Complaint:   Routine Prenatal Visit  History of Present Illness:   [name] is a 34 y.o. G71P1001 female at [redacted]w[redacted]d with an Estimated Date of Delivery: 12/25/20 being seen today for ongoing management of a high-risk pregnancy complicated by chronic hypertension currently on labetalol 200 mg BID Today she reports lower pelvic pain, LBP. Contractions: Irritability. Vag. Bleeding: None.  Movement: Present. denies leaking of fluid.  Review of Systems:   Pertinent items are noted in HPI Denies abnormal vaginal discharge w/ itching/odor/irritation, headaches, visual changes, shortness of breath, chest pain, abdominal pain, severe nausea/vomiting, or problems with urination or bowel movements unless otherwise stated above. Pertinent History Reviewed:  Reviewed past medical,surgical, social, obstetrical and family history.  Reviewed problem list, medications and allergies. Physical Assessment:   Vitals:   10/31/20 1535  BP: 133/78  Pulse: 94  Weight: (!) 358 lb 6.4 oz (162.6 kg)  Body mass index is 57.85 kg/m.           Physical Examination:   General appearance: alert, well appearing, and in no distress  Mental status: alert, oriented to person, place, and time  Skin: warm & dry   Extremities: Edema: Trace    Cardiovascular: normal heart rate noted  Respiratory: normal respiratory effort, no distress  Abdomen: gravid, soft, non-tender  Pelvic: Cervical exam deferred         Fetal Status:     Movement: Present    Fetal Surveillance Testing today: [place] 32+1 wks,cephalic,BPP 8/8,anterior placenta gr 3,fhr 171 bpm,AFI 17 cm,RI .63,.56,.56,.71=64%,limited view     Results for orders placed or performed in visit on 10/31/20 (from the past 24 hour(s))  POC Urinalysis Dipstick OB   Collection Time: 10/31/20  3:33 PM  Result Value Ref Range   Color, UA     Clarity, UA     Glucose, UA  Negative Negative   Bilirubin, UA     Ketones, UA neg    Spec Grav, UA     Blood, UA neg    pH, UA     POC,PROTEIN,UA Negative Negative, Trace, Small (1+), Moderate (2+), Large (3+), 4+   Urobilinogen, UA     Nitrite, UA neg    Leukocytes, UA Negative Negative   Appearance     Odor      Assessment & Plan:  1) High-risk pregnancy G2P1001 at [redacted]w[redacted]d with an Estimated Date of Delivery: 12/25/20   2) CHTN, stable, BP well controlled at home, normal growth percentile Treatment plan  Twice weekly testing, IOL 39 weeks  3) round ligament, tips given, recommend maternity belt  Meds: No orders of the defined types were placed in this encounter.    Reviewed: Preterm labor symptoms and general obstetric precautions including but not limited to vaginal bleeding, contractions, leaking of fluid and fetal movement were reviewed in detail with the patient.  All questions were answered.Doesn't have a home bp cuff. Plans to get one. .   Follow-up: No follow-ups on file.  Future Appointments  Date Time Provider Department Center  11/04/2020 11:10 AM Cheral Marker, CNM [facility]  11/11/2020 10:10 AM [facility] NURSE [facility]  11/18/2020  2:30 PM [facility] NURSE [facility]  11/21/2020 10:00 AM [facility] [place] [facility] None  11/21/2020 10:50 AM Jacklyn Shell, CNM [facility]  11/25/2020 10:50 AM [facility] NURSE [facility]  11/28/2020 11:00 AM [facility] [place] [facility]  None  11/28/2020 11:50 AM Jacklyn Shell, CNM [facility]  12/02/2020  9:30 AM [facility] NURSE [facility]  12/05/2020 11:00 AM [facility] [place] [facility] None  12/05/2020 11:50 AM Cheral Marker, CNM [facility]  12/09/2020  2:50 PM [facility] NURSE [facility]  12/12/2020 11:00 AM [facility] [place] [facility] None  12/12/2020 11:50 AM Lazaro Arms, MD [facility]  12/16/2020 10:30 AM [facility] NURSE [facility]  12/19/2020 11:00 AM [facility] [place] [facility] None   12/19/2020 11:50 AM Jacklyn Shell, CNM [facility]  12/23/2020  2:30 PM [facility] NURSE [facility]  12/26/2020 11:00 AM [facility] [place] [facility] None  12/26/2020 11:50 AM Lazaro Arms, MD [facility]  12/30/2020 10:10 AM [facility] NURSE [facility]  01/06/2021 10:10 AM [facility]  01/09/2021 11:00 AM [facility] [place] [facility] None  01/09/2021 11:50 AM Cresenzo-Dishmon, Scarlette Calico, CNM [facility]    Orders Placed This Encounter  Procedures  . POC Urinalysis Dipstick OB   Jacklyn Shell DNP, CNM 10/31/2020 7:56 PM

## 2020-11-04 ENCOUNTER — Other Ambulatory Visit: Payer: Medicaid Other | Admitting: Women's Health

## 2020-11-11 ENCOUNTER — Ambulatory Visit (INDEPENDENT_AMBULATORY_CARE_PROVIDER_SITE_OTHER): Payer: Medicaid Other | Admitting: *Deleted

## 2020-11-11 ENCOUNTER — Other Ambulatory Visit: Payer: Self-pay

## 2020-11-11 VITALS — BP 121/71 | HR 115 | Wt 358.8 lb

## 2020-11-11 DIAGNOSIS — O10013 Pre-existing essential hypertension complicating pregnancy, third trimester: Secondary | ICD-10-CM

## 2020-11-11 DIAGNOSIS — O0993 Supervision of high risk pregnancy, unspecified, third trimester: Secondary | ICD-10-CM

## 2020-11-11 DIAGNOSIS — Z1389 Encounter for screening for other disorder: Secondary | ICD-10-CM

## 2020-11-11 DIAGNOSIS — Z331 Pregnant state, incidental: Secondary | ICD-10-CM

## 2020-11-11 DIAGNOSIS — Z3A33 33 weeks gestation of pregnancy: Secondary | ICD-10-CM

## 2020-11-11 DIAGNOSIS — I10 Essential (primary) hypertension: Secondary | ICD-10-CM

## 2020-11-11 LAB — POCT URINALYSIS DIPSTICK OB
Blood, UA: NEGATIVE
Glucose, UA: NEGATIVE
Leukocytes, UA: NEGATIVE
Nitrite, UA: NEGATIVE

## 2020-11-11 NOTE — Progress Notes (Addendum)
   NURSE VISIT- NST  SUBJECTIVE:  [name] is a 34 y.o. G37P1001 female at [redacted]w[redacted]d, here for a NST for pregnancy complicated by [facility].  She reports active fetal movement, contractions: none, vaginal bleeding: none, membranes: intact.   OBJECTIVE:  BP 121/71   Pulse (!) 115   Wt (!) 358 lb 12.8 oz (162.8 kg)   LMP 03/20/2020   BMI 57.91 kg/m   Appears well, no apparent distress  Results for orders placed or performed in visit on 11/11/20 (from the past 24 hour(s))  POC Urinalysis Dipstick OB   Collection Time: 11/11/20 10:30 AM  Result Value Ref Range   Color, UA     Clarity, UA     Glucose, UA Negative Negative   Bilirubin, UA     Ketones, UA mod    Spec Grav, UA     Blood, UA neg    pH, UA     POC,PROTEIN,UA Trace Negative, Trace, Small (1+), Moderate (2+), Large (3+), 4+   Urobilinogen, UA     Nitrite, UA neg    Leukocytes, UA Negative Negative   Appearance     Odor      NST: FHR baseline 155 bpm, Variability: moderate, Accelerations:present, Decelerations:  Absent= Cat 1/Reactive Toco: none   ASSESSMENT: G2P1001 at [redacted]w[redacted]d with CHTN NST reactive  PLAN: EFM strip reviewed by [name], CNM, [facility]   Recommendations: keep next appointment as scheduled    Jobe Marker  11/11/2020 12:23 PM  Attestation of Attending Supervision of Nursing Visit Encounter: Evaluation and management procedures were performed by the nursing staff under my supervision and collaboration.  I have reviewed the nurse's note and chart, and I agree with the management and plan.  Rockne Coons MD Attending Physician for the Center for [facility] 11/17/2020 7:56 AM  Attestation of Attending Supervision of Nursing Visit Encounter: Evaluation and management procedures were performed by the nursing staff under my supervision and collaboration.  I have reviewed the nurse's note and chart, and I agree with the management and plan.  Rockne Coons MD Attending Physician for the Center  for [facility] 11/17/2020 7:57 AM

## 2020-11-13 ENCOUNTER — Other Ambulatory Visit: Payer: Self-pay | Admitting: Obstetrics & Gynecology

## 2020-11-13 DIAGNOSIS — O10919 Unspecified pre-existing hypertension complicating pregnancy, unspecified trimester: Secondary | ICD-10-CM

## 2020-11-14 ENCOUNTER — Telehealth: Payer: Self-pay | Admitting: Pediatrics

## 2020-11-14 ENCOUNTER — Ambulatory Visit (INDEPENDENT_AMBULATORY_CARE_PROVIDER_SITE_OTHER): Payer: Medicaid Other | Admitting: Advanced Practice Midwife

## 2020-11-14 ENCOUNTER — Ambulatory Visit (INDEPENDENT_AMBULATORY_CARE_PROVIDER_SITE_OTHER): Payer: Medicaid Other

## 2020-11-14 ENCOUNTER — Other Ambulatory Visit: Payer: Self-pay

## 2020-11-14 VITALS — BP 126/78 | HR 113 | Wt 359.8 lb

## 2020-11-14 DIAGNOSIS — Z3A34 34 weeks gestation of pregnancy: Secondary | ICD-10-CM | POA: Diagnosis not present

## 2020-11-14 DIAGNOSIS — O10919 Unspecified pre-existing hypertension complicating pregnancy, unspecified trimester: Secondary | ICD-10-CM

## 2020-11-14 DIAGNOSIS — Z331 Pregnant state, incidental: Secondary | ICD-10-CM

## 2020-11-14 DIAGNOSIS — I10 Essential (primary) hypertension: Secondary | ICD-10-CM

## 2020-11-14 DIAGNOSIS — Z79891 Long term (current) use of opiate analgesic: Secondary | ICD-10-CM

## 2020-11-14 DIAGNOSIS — Z1389 Encounter for screening for other disorder: Secondary | ICD-10-CM

## 2020-11-14 DIAGNOSIS — O0993 Supervision of high risk pregnancy, unspecified, third trimester: Secondary | ICD-10-CM

## 2020-11-14 LAB — POCT URINALYSIS DIPSTICK OB
Blood, UA: NEGATIVE
Glucose, UA: NEGATIVE
Ketones, UA: NEGATIVE
Leukocytes, UA: NEGATIVE
Nitrite, UA: NEGATIVE

## 2020-11-14 NOTE — Progress Notes (Signed)
HIGH-RISK PREGNANCY VISIT Patient name: [name] MRN [id number]  Date of birth: 09-12-1986 Chief Complaint:   Routine Prenatal Visit  History of Present Illness:   [name] is a 34 y.o. G77P1001 female at [redacted]w[redacted]d with an Estimated Date of Delivery: 12/25/20 being seen today for ongoing management of a high-risk pregnancy complicated by [facility] currently on labetalol 200mg  BID Today she reports feeling better, got a belly band. Contractions: Irritability. Vag. Bleeding: None.  Movement: Present. denies leaking of fluid.  Review of Systems:   Pertinent items are noted in HPI Denies abnormal vaginal discharge w/ itching/odor/irritation, headaches, visual changes, shortness of breath, chest pain, abdominal pain, severe nausea/vomiting, or problems with urination or bowel movements unless otherwise stated above. Pertinent History Reviewed:  Reviewed past medical,surgical, social, obstetrical and family history.  Reviewed problem list, medications and allergies. Physical Assessment:   Vitals:   11/14/20 0912  BP: 126/78  Pulse: (!) 113  Weight: (!) 359 lb 12.8 oz (163.2 kg)  Body mass index is 58.07 kg/m.           Physical Examination:   General appearance: alert, well appearing, and in no distress  Mental status: alert, oriented to person, place, and time  Skin: warm & dry   Extremities: Edema: Trace    Cardiovascular: normal heart rate noted  Respiratory: normal respiratory effort, no distress  Abdomen: gravid, soft, non-tender  Pelvic: Cervical exam deferred         Fetal Status:     Movement: Present    Fetal Surveillance Testing today: 11/16/20 34+1 wks,cephalic,BPP 8/8,FHR 161 bpm,anterior placenta gr 3,AFI 22.8 cm,RI .59,.60,.58=52%,EFW 2314 g 38%,limited view because of fetal age and pt body habitus    Results for orders placed or performed in visit on 11/14/20 (from the past 24 hour(s))  POC Urinalysis Dipstick OB   Collection Time: 11/14/20  9:12 AM  Result Value Ref  Range   Color, UA     Clarity, UA     Glucose, UA Negative Negative   Bilirubin, UA     Ketones, UA neg    Spec Grav, UA     Blood, UA neg    pH, UA     POC,PROTEIN,UA Trace Negative, Trace, Small (1+), Moderate (2+), Large (3+), 4+   Urobilinogen, UA     Nitrite, UA neg    Leukocytes, UA Negative Negative   Appearance     Odor      Assessment & Plan:  1) High-risk pregnancy G2P1001 at [redacted]w[redacted]d with an Estimated Date of Delivery: 12/25/20   2) CHTN, stable Treatment plan  Continue meds, twice weekly testing  3) HSV,   Treatment plan: change valtrex to 500 BID  4) long term opiate use:  NAS consult ordered  Meds: No orders of the defined types were placed in this encounter.   Labs/procedures today: BPP/dopplers/EFW   Reviewed: Preterm labor symptoms and general obstetric precautions including but not limited to vaginal bleeding, contractions, leaking of fluid and fetal movement were reviewed in detail with the patient.  All questions were answered.  Follow-up: No follow-ups on file.  Future Appointments  Date Time Provider Department Center  11/14/2020  9:50 AM 11/16/2020, CNM [facility]  11/18/2020  2:30 PM [facility] NURSE [facility]  11/21/2020  9:45 AM [facility] 01/19/2021 [facility] None  11/21/2020 10:50 AM 01/19/2021, MD [facility]  11/25/2020 10:50 AM [facility] NURSE [facility]  11/28/2020 11:00 AM [facility] - 11/30/2020  [place] [facility] None  11/28/2020 11:50 AM Jacklyn Shell, CNM [facility]  12/02/2020  9:30 AM [facility] NURSE [facility]  12/05/2020 11:00 AM [facility] [place] [facility] None  12/05/2020 11:50 AM Cheral Marker, CNM [facility]  12/09/2020  2:50 PM [facility] NURSE [facility]  12/12/2020 11:00 AM [facility] [place] [facility] None  12/12/2020 11:50 AM Lazaro Arms, MD [facility]  12/16/2020 10:30 AM [facility] NURSE [facility]  12/19/2020 11:00 AM [facility] [place] [facility] None  12/19/2020 11:50 AM  Jacklyn Shell, CNM [facility]  12/23/2020  2:30 PM [facility] NURSE [facility]  12/26/2020 11:00 AM [facility] [place] [facility] None  12/26/2020 11:50 AM Lazaro Arms, MD [facility]  12/30/2020 10:10 AM [facility] NURSE [facility]  01/06/2021 10:10 AM [facility] NURSE [facility]  01/09/2021 11:00 AM [facility] [place] [facility] None  01/09/2021 11:50 AM Cresenzo-Dishmon, Scarlette Calico, CNM [facility]    Orders Placed This Encounter  Procedures  . POC Urinalysis Dipstick OB   Jacklyn Shell DNP, CNM 11/14/2020 9:17 AM

## 2020-11-14 NOTE — Patient Instructions (Addendum)
Safe Medications in Pregnancy   Acne: Benzoyl Peroxide Salicylic Acid  Backache/Headache: Tylenol: 2 regular strength every 4 hours OR              2 Extra strength every 6 hours  Colds/Coughs/Allergies: Benadryl (alcohol free) 25 mg every 6 hours as needed Breath right strips Claritin Cepacol throat lozenges Chloraseptic throat spray Cold-Eeze- up to three times per day Cough drops, alcohol free Flonase (by prescription only) Guaifenesin Mucinex Robitussin DM (plain only, alcohol free) Saline nasal spray/drops Sudafed (pseudoephedrine) & Actifed ** use only after [redacted] weeks gestation and if you do not have high blood pressure Tylenol Vicks Vaporub Zinc lozenges Zyrtec   Constipation: Colace Ducolax suppositories Fleet enema Glycerin suppositories Metamucil Milk of magnesia Miralax Senokot Smooth move tea  Diarrhea: Kaopectate Imodium A-D  *NO pepto Bismol  Hemorrhoids: Anusol Anusol HC Preparation H Tucks  Indigestion: Tums Maalox Mylanta Zantac  Pepcid  Insomnia: Benadryl (alcohol free) 25mg  every 6 hours as needed Tylenol PM Unisom, no Gelcaps  Leg Cramps: Tums MagGel  Nausea/Vomiting:  Bonine Dramamine Emetrol Ginger extract Sea bands Meclizine  Nausea medication to take during pregnancy:  Unisom (doxylamine succinate 25 mg tablets) Take one tablet daily at bedtime. If symptoms are not adequately controlled, the dose can be increased to a maximum recommended dose of two tablets daily (1/2 tablet in the morning, 1/2 tablet mid-afternoon and one at bedtime). Vitamin B6 100mg  tablets. Take one tablet twice a day (up to 200 mg per day).  Skin Rashes: Aveeno products Benadryl cream or 25mg  every 6 hours as needed Calamine Lotion 1% cortisone cream  Yeast infection: Gyne-lotrimin 7 Monistat 7   **If taking multiple medications, please check labels to avoid duplicating the same active ingredients **take medication as directed on  the label ** Do not exceed 4000 mg of tylenol in 24 hours **Do not take medications that contain aspirin or ibuprofen    , I greatly value your feedback.  If you receive a survey following your visit with today, we appreciate you taking the time to fill it out.  Thanks, , DNP, CNM  [facility] HAS MOVED!!! It is now [facility] ([address] [place], [facility] [address]) Entrance located off of [address] Free 24/7 valet parking   Go to [email or web address] to register for FREE online childbirth classes    Call the office [phone]) or go to [facility] if:  You begin to have strong, frequent contractions  Your water breaks.  Sometimes it is a big gush of fluid, sometimes it is just a trickle that keeps getting your panties wet or running down your legs  You have vaginal bleeding.  It is normal to have a small amount of spotting if your cervix was checked.   You don't feel your baby moving like normal.  If you don't, get you something to eat and drink and lay down and focus on feeling your baby move.  You should feel at least 10 movements in 2 hours.  If you don't, you should call the office or go to [facility].   Home Blood Pressure Monitoring for Patients   Your provider has recommended that you check your blood pressure (BP) at least once a week at home. If you do not have a blood pressure cuff at home, one will be provided for you. Contact your provider if you have not received your monitor within 1 week.  Helpful Tips for Accurate Home Blood Pressure Checks  . Don't smoke, exercise, or drink caffeine 30 minutes before checking your BP . Use the restroom before checking your BP (a full bladder can raise your pressure) . Relax in a comfortable upright chair . Feet on the ground . Left arm resting comfortably on a flat surface at the level of your heart . Legs uncrossed . Back  supported . Sit quietly and don't talk . Place the cuff on your bare arm . Adjust snuggly, so that only two fingertips can fit between your skin and the top of the cuff . Check 2 readings separated by at least one minute . Keep a log of your BP readings . For a visual, please reference this diagram: http://ccnc.care/bpdiagram  Provider Name: [facility]     Phone: [phone]  Zone 1: ALL CLEAR  Continue to monitor your symptoms:  . BP reading is less than 140 (top number) or less than 90 (bottom number)  . No right upper stomach pain . No headaches or seeing spots . No feeling nauseated or throwing up . No swelling in face and hands  Zone 2: CAUTION Call your doctor's office for any of the following:  . BP reading is greater than 140 (top number) or greater than 90 (bottom number)  . Stomach pain under your ribs in the middle or right side . Headaches or seeing spots . Feeling nauseated or throwing up . Swelling in face and hands  Zone 3: EMERGENCY  Seek immediate medical care if you have any of the following:  . BP reading is greater than160 (top number) or greater than 110 (bottom number) . Severe headaches not improving with Tylenol . Serious difficulty catching your breath . Any worsening symptoms from Zone 2

## 2020-11-14 NOTE — Progress Notes (Signed)
[place] 34+1 wks,cephalic,BPP 8/8,FHR 161 bpm,anterior placenta gr 3,AFI 22.8 cm,RI .59,.60,.58=52%,EFW 2314 g 38%,limited view because of fetal age and pt body habitus

## 2020-11-16 NOTE — L&D Delivery Note (Signed)
OB/GYN Faculty Practice Delivery Note  [name] is a 35 y.o. G2P1001 s/p SVD at [redacted]w[redacted]d. She was admitted for IOL for cHTN w SIPEC w/o SF.   ROM: 4h 35m with light meconium fluid GBS Status:  Positive/-- (01/13 1351) Maximum Maternal Temperature: 98  Labor Progress: . Initial SVE: 1cm. She received cytotec, FB, pitocin. She was AROM'd for meconium stained fluid. She then progressed to complete.   Delivery Date/Time: 1/22 at 15:01 Delivery: Called to room and patient was complete and pushing. Head delivered OA. No nuchal cord present. Shoulder and body delivered in usual fashion. Infant with spontaneous cry, placed on mother's abdomen, dried and stimulated. Cord clamped x 2 after 30 seconds due to non vigorous infant, cut by me. Cord blood drawn. Placenta delivered spontaneously with gentle cord traction. Fundus initially firm with massage and Pitocin. Labia, perineum, vagina, and cervix inspected inspected with no lacerations. Patient had persistent bleeding and fundus became boggy despite pitocin. TXA given. Due to continued bleeding, given rectal cytotec , IM methergine. Lower uterine sweep notable for small, possible piece of placenta. Uterine bleeding decreased with above measures.  Baby Weight: pending  Placenta: Sent to L&D Complications: Postpartum Hemorrhage  Lacerations: none EBL: 1319 mL Analgesia: Epidural   Infant:  APGAR (1 MIN): 6   APGAR (5 MINS): 8   APGAR (10 MINS): 9     Casper Harrison, MD [facility] Family Medicine Fellow, [facility] for [facility], [facility] 12/07/2020, 4:18 PM

## 2020-11-18 ENCOUNTER — Other Ambulatory Visit: Payer: Medicaid Other

## 2020-11-20 ENCOUNTER — Other Ambulatory Visit: Payer: Self-pay | Admitting: Advanced Practice Midwife

## 2020-11-20 DIAGNOSIS — O10919 Unspecified pre-existing hypertension complicating pregnancy, unspecified trimester: Secondary | ICD-10-CM

## 2020-11-21 ENCOUNTER — Other Ambulatory Visit: Payer: Self-pay

## 2020-11-21 ENCOUNTER — Encounter: Payer: Self-pay | Admitting: Obstetrics and Gynecology

## 2020-11-21 ENCOUNTER — Other Ambulatory Visit: Payer: Self-pay | Admitting: Advanced Practice Midwife

## 2020-11-21 ENCOUNTER — Ambulatory Visit (INDEPENDENT_AMBULATORY_CARE_PROVIDER_SITE_OTHER): Payer: Medicaid Other

## 2020-11-21 ENCOUNTER — Ambulatory Visit (INDEPENDENT_AMBULATORY_CARE_PROVIDER_SITE_OTHER): Payer: Medicaid Other | Admitting: Obstetrics and Gynecology

## 2020-11-21 VITALS — BP 139/93 | HR 107 | Wt 357.6 lb

## 2020-11-21 DIAGNOSIS — O0993 Supervision of high risk pregnancy, unspecified, third trimester: Secondary | ICD-10-CM

## 2020-11-21 DIAGNOSIS — I10 Essential (primary) hypertension: Secondary | ICD-10-CM

## 2020-11-21 DIAGNOSIS — Z3A35 35 weeks gestation of pregnancy: Secondary | ICD-10-CM

## 2020-11-21 DIAGNOSIS — Z1389 Encounter for screening for other disorder: Secondary | ICD-10-CM

## 2020-11-21 DIAGNOSIS — Z331 Pregnant state, incidental: Secondary | ICD-10-CM

## 2020-11-21 DIAGNOSIS — O10919 Unspecified pre-existing hypertension complicating pregnancy, unspecified trimester: Secondary | ICD-10-CM | POA: Diagnosis not present

## 2020-11-21 LAB — POCT URINALYSIS DIPSTICK OB
Glucose, UA: NEGATIVE
Leukocytes, UA: NEGATIVE
Nitrite, UA: NEGATIVE

## 2020-11-21 MED ORDER — ONDANSETRON 4 MG PO TBDP: 4.0000 mg | ORAL_TABLET | Freq: Four times a day (QID) | ORAL | 2 refills | Status: DC | PRN

## 2020-11-21 NOTE — Progress Notes (Signed)
zofran ODT  

## 2020-11-21 NOTE — Progress Notes (Signed)
Subjective:  [name] is a 35 y.o. G2P1001 at [redacted]w[redacted]d being seen today for ongoing prenatal care.  She is currently monitored for the following issues for this high-risk pregnancy and has KNEE PAIN; Esophageal reflux; Lumbar radiculopathy; Rectal bleeding; Internal hemorrhoids with other complication; Morbid obesity (HCC); Fatty liver; Splenomegaly; Depression; History of ovarian cyst; Intractable chronic migraine without aura; Long-term current use of opiate analgesic; Supervision of high-risk pregnancy; Luetscher's syndrome; Chronic hypertension; Depression with anxiety; and Asymptomatic bacteriuria on their problem list.  Patient reports general discomforts of pregnancy, denies HA or visual changes.  Contractions: Irregular. Vag. Bleeding: None.  Movement: Present. Denies leaking of fluid.   The following portions of the patient's history were reviewed and updated as appropriate: allergies, current medications, past family history, past medical history, past social history, past surgical history and problem list. Problem list updated.  Objective:   Vitals:   11/21/20 1028  BP: (!) 139/93  Pulse: (!) 107  Weight: (!) 357 lb 9.6 oz (162.2 kg)    Fetal Status:     Movement: Present     General:  Alert, oriented and cooperative. Patient is in no acute distress.  Skin: Skin is warm and dry. No rash noted.   Cardiovascular: Normal heart rate noted  Respiratory: Normal respiratory effort, no problems with respiration noted  Abdomen: Soft, gravid, appropriate for gestational age. Pain/Pressure: Present     Pelvic:  Cervical exam deferred        Extremities: Normal range of motion.  Edema: Trace  Mental Status: Normal mood and affect. Normal behavior. Normal judgment and thought content.   Urinalysis:      Assessment and Plan:  Pregnancy: G2P1001 at [redacted]w[redacted]d  1. Supervision of high risk pregnancy in third trimester Stable GBS next visit  2. Screening for genitourinary condition  - POC  Urinalysis Dipstick OB  3. Pregnant state, incidental  - POC Urinalysis Dipstick OB  4. [redacted] weeks gestation of pregnancy  - POC Urinalysis Dipstick OB  5. Chronic hypertension BP stable Continue with current medications BPP 8/8 Continue with antenatal testing  Preterm labor symptoms and general obstetric precautions including but not limited to vaginal bleeding, contractions, leaking of fluid and fetal movement were reviewed in detail with the patient. Please refer to After Visit Summary for other counseling recommendations.  Return for Already scheduled.   Hermina Staggers, MD

## 2020-11-21 NOTE — Progress Notes (Signed)
[place] 35+1 wks,cephalic,BPP 8/8,FHR 146 bpm,anterior placenta gr 3,RI .54,.56,.58,.62=54%,AFI 15 CM,limted view

## 2020-11-25 ENCOUNTER — Other Ambulatory Visit: Payer: Medicaid Other

## 2020-11-27 ENCOUNTER — Other Ambulatory Visit: Payer: Self-pay | Admitting: Obstetrics and Gynecology

## 2020-11-27 DIAGNOSIS — O10919 Unspecified pre-existing hypertension complicating pregnancy, unspecified trimester: Secondary | ICD-10-CM

## 2020-11-27 DIAGNOSIS — O99213 Obesity complicating pregnancy, third trimester: Secondary | ICD-10-CM

## 2020-11-28 ENCOUNTER — Other Ambulatory Visit (HOSPITAL_COMMUNITY)
Admission: RE | Admit: 2020-11-28 | Discharge: 2020-11-28 | Disposition: A | Discharge status: Home / Self Care | Payer: Medicaid Other | Source: Ambulatory Visit | Attending: Advanced Practice Midwife | Admitting: Advanced Practice Midwife

## 2020-11-28 ENCOUNTER — Ambulatory Visit (INDEPENDENT_AMBULATORY_CARE_PROVIDER_SITE_OTHER): Payer: Medicaid Other

## 2020-11-28 ENCOUNTER — Other Ambulatory Visit: Payer: Self-pay

## 2020-11-28 ENCOUNTER — Encounter: Payer: Self-pay | Admitting: Advanced Practice Midwife

## 2020-11-28 ENCOUNTER — Ambulatory Visit (INDEPENDENT_AMBULATORY_CARE_PROVIDER_SITE_OTHER): Payer: Medicaid Other | Admitting: Advanced Practice Midwife

## 2020-11-28 VITALS — BP 131/86 | HR 109 | Wt 359.2 lb

## 2020-11-28 DIAGNOSIS — O0993 Supervision of high risk pregnancy, unspecified, third trimester: Secondary | ICD-10-CM

## 2020-11-28 DIAGNOSIS — Z331 Pregnant state, incidental: Secondary | ICD-10-CM

## 2020-11-28 DIAGNOSIS — Z3A36 36 weeks gestation of pregnancy: Secondary | ICD-10-CM | POA: Diagnosis present

## 2020-11-28 DIAGNOSIS — O99213 Obesity complicating pregnancy, third trimester: Secondary | ICD-10-CM | POA: Diagnosis not present

## 2020-11-28 DIAGNOSIS — Z1389 Encounter for screening for other disorder: Secondary | ICD-10-CM

## 2020-11-28 DIAGNOSIS — O10919 Unspecified pre-existing hypertension complicating pregnancy, unspecified trimester: Secondary | ICD-10-CM

## 2020-11-28 DIAGNOSIS — I10 Essential (primary) hypertension: Secondary | ICD-10-CM

## 2020-11-28 LAB — POCT URINALYSIS DIPSTICK OB
Glucose, UA: NEGATIVE
Leukocytes, UA: NEGATIVE
Nitrite, UA: NEGATIVE
POC,PROTEIN,UA: NEGATIVE

## 2020-11-28 NOTE — Progress Notes (Signed)
HIGH-RISK PREGNANCY VISIT Patient name: [name] MRN [id number]  Date of birth: 05/11/1986 Chief Complaint:   High Risk Gestation ([place] today; GBS, GC/CHL)  History of Present Illness:   [name] is a 35 y.o. G64P1001 female at [redacted]w[redacted]d with an Estimated Date of Delivery: 12/25/20 being seen today for ongoing management of a high-risk pregnancy complicated by [facility] currently on labetalol 200mg  BID Today she reports no complaints. Contractions: Not present. Vag. Bleeding: None.  Movement: Present. denies leaking of fluid.  Review of Systems:   Pertinent items are noted in HPI Denies abnormal vaginal discharge w/ itching/odor/irritation, headaches, visual changes, shortness of breath, chest pain, abdominal pain, severe nausea/vomiting, or problems with urination or bowel movements unless otherwise stated above. Pertinent History Reviewed:  Reviewed past medical,surgical, social, obstetrical and family history.  Reviewed problem list, medications and allergies. Physical Assessment:   Vitals:   11/28/20 1216 11/28/20 1219  BP: (!) 144/94 131/86  Pulse: (!) 106 (!) 109  Weight: (!) 359 lb 3.2 oz (162.9 kg)   Body mass index is 57.98 kg/m.           Physical Examination:   General appearance: alert, well appearing, and in no distress  Mental status: alert, oriented to person, place, and time  Skin: warm & dry   Extremities: Edema: Trace    Cardiovascular: normal heart rate noted  Respiratory: normal respiratory effort, no distress  Abdomen: gravid, soft, non-tender  Pelvic: Cervical exam performed  Dilation: 1 Effacement (%): Thick Station: -3  Fetal Status:     Movement: Present Presentation: Vertex  Fetal Surveillance Testing today: 11/30/20 36+1 wks,cephalic,anterior placenta gr 3,afi 17 cm,BPP 8/8,RI .59,.55,.63=67%,FHR 154   Results for orders placed or performed in visit on 11/28/20 (from the past 24 hour(s))  POC Urinalysis Dipstick OB   Collection Time: 11/28/20 12:06 PM   Result Value Ref Range   Color, UA     Clarity, UA     Glucose, UA Negative Negative   Bilirubin, UA     Ketones, UA 1+    Spec Grav, UA     Blood, UA trace    pH, UA     POC,PROTEIN,UA Negative Negative, Trace, Small (1+), Moderate (2+), Large (3+), 4+   Urobilinogen, UA     Nitrite, UA neg    Leukocytes, UA Negative Negative   Appearance     Odor      Assessment & Plan:  1) High-risk pregnancy G2P1001 at [redacted]w[redacted]d with an Estimated Date of Delivery: 12/25/20   2) CHTN, stable Treatment plan  Continue meds and twice weekly testing  3) opioid use, [facility] sent note saying they have been trying to get in touch with her. Encouraged to set up voicemail or answer phone calls from [phone] numbers  Meds: No orders of the defined types were placed in this encounter.   Labs/procedures today: GBS GC CHL   Reviewed: Preterm labor symptoms and general obstetric precautions including but not limited to vaginal bleeding, contractions, leaking of fluid and fetal movement were reviewed in detail with the patient.  All questions were answered. .   Follow-up:   Future Appointments  Date Time Provider Department Center  12/02/2020  9:30 AM [facility] NURSE [facility]  12/05/2020 11:00 AM [facility] 12/07/2020 [facility] None  12/05/2020 11:50 AM 12/07/2020, CNM [facility]  12/09/2020  2:50 PM [facility] NURSE [facility]  12/12/2020 11:00 AM [facility] 12/14/2020 [facility] None  12/12/2020 11:50  AM Lazaro Arms, MD [facility]  12/16/2020 10:30 AM [facility] NURSE [facility]  12/19/2020 11:00 AM [facility] [place] [facility] None  12/19/2020 11:50 AM Jacklyn Shell, CNM [facility]  12/23/2020  2:30 PM [facility] NURSE [facility]  12/26/2020 11:00 AM [facility] [place] [facility] None  12/26/2020 11:50 AM Lazaro Arms, MD [facility]  12/30/2020 10:10 AM [facility] NURSE [facility]  01/06/2021 10:10 AM [facility] NURSE [facility]  01/09/2021 11:00 AM [facility] [place]  [facility] None  01/09/2021 11:50 AM Cresenzo-Dishmon, Scarlette Calico, CNM [facility]    Orders Placed This Encounter  Procedures  . Strep Gp B NAA  . POC Urinalysis Dipstick OB   Jacklyn Shell DNP, CNM 11/28/2020 12:29 PM

## 2020-11-28 NOTE — Progress Notes (Signed)
[place] 36+1 wks,cephalic,anterior placenta gr 3,afi 17 cm,BPP 8/8,RI .59,.55,.63=67%,FHR 154

## 2020-11-28 NOTE — Patient Instructions (Signed)

## 2020-11-29 LAB — CERVICOVAGINAL ANCILLARY ONLY
Chlamydia: NEGATIVE
Comment: NEGATIVE
Comment: NORMAL
Neisseria Gonorrhea: NEGATIVE

## 2020-11-30 LAB — STREP GP B NAA: Strep Gp B NAA: POSITIVE — AB

## 2020-12-02 ENCOUNTER — Other Ambulatory Visit: Payer: Medicaid Other

## 2020-12-03 ENCOUNTER — Other Ambulatory Visit: Payer: Medicaid Other

## 2020-12-04 ENCOUNTER — Other Ambulatory Visit: Payer: Self-pay | Admitting: Advanced Practice Midwife

## 2020-12-04 DIAGNOSIS — O10919 Unspecified pre-existing hypertension complicating pregnancy, unspecified trimester: Secondary | ICD-10-CM

## 2020-12-04 DIAGNOSIS — O99213 Obesity complicating pregnancy, third trimester: Secondary | ICD-10-CM

## 2020-12-05 ENCOUNTER — Ambulatory Visit (INDEPENDENT_AMBULATORY_CARE_PROVIDER_SITE_OTHER): Payer: Medicaid Other

## 2020-12-05 ENCOUNTER — Other Ambulatory Visit: Payer: Self-pay

## 2020-12-05 ENCOUNTER — Encounter: Payer: Self-pay | Admitting: Women's Health

## 2020-12-05 ENCOUNTER — Ambulatory Visit (INDEPENDENT_AMBULATORY_CARE_PROVIDER_SITE_OTHER): Payer: Medicaid Other | Admitting: Women's Health

## 2020-12-05 VITALS — BP 131/92 | HR 114 | Wt 359.2 lb

## 2020-12-05 DIAGNOSIS — Z79891 Long term (current) use of opiate analgesic: Secondary | ICD-10-CM

## 2020-12-05 DIAGNOSIS — Z1389 Encounter for screening for other disorder: Secondary | ICD-10-CM

## 2020-12-05 DIAGNOSIS — O10919 Unspecified pre-existing hypertension complicating pregnancy, unspecified trimester: Secondary | ICD-10-CM | POA: Diagnosis not present

## 2020-12-05 DIAGNOSIS — I10 Essential (primary) hypertension: Secondary | ICD-10-CM

## 2020-12-05 DIAGNOSIS — O99213 Obesity complicating pregnancy, third trimester: Secondary | ICD-10-CM

## 2020-12-05 DIAGNOSIS — O0993 Supervision of high risk pregnancy, unspecified, third trimester: Secondary | ICD-10-CM

## 2020-12-05 DIAGNOSIS — Z331 Pregnant state, incidental: Secondary | ICD-10-CM

## 2020-12-05 LAB — POCT URINALYSIS DIPSTICK OB
Blood, UA: NEGATIVE
Glucose, UA: NEGATIVE
Leukocytes, UA: NEGATIVE
Nitrite, UA: NEGATIVE

## 2020-12-05 NOTE — Patient Instructions (Signed)
[name], I greatly value your feedback.  If you receive a survey following your visit with [place] today, we appreciate you taking the time to fill it out.  Thanks, [name], CNM, WHNP-BC  [facility] ([address] [place], [place] [address]) Entrance C, located off of [address] valet parking   Go to [email or web address] to register for FREE online childbirth classes    Call the office [phone]) or go to [facility] if:  You begin to have strong, frequent contractions  Your water breaks.  Sometimes it is a big gush of fluid, sometimes it is just a trickle that keeps getting your panties wet or running down your legs  You have vaginal bleeding.  It is normal to have a small amount of spotting if your cervix was checked.   You don't feel your baby moving like normal.  If you don't, get you something to eat and drink and lay down and focus on feeling your baby move.  You should feel at least 10 movements in 2 hours.  If you don't, you should call the office or go to [facility].   Call the office [phone]) or go to [facility] hospital for these signs of pre-eclampsia:  Severe headache that does not go away with Tylenol  Visual changes- seeing spots, double, blurred vision  Pain under your right breast or upper abdomen that does not go away with Tums or heartburn medicine  Nausea and/or vomiting  Severe swelling in your hands, feet, and face    Home Blood Pressure Monitoring for Patients   Your provider has recommended that you check your blood pressure (BP) at least once a week at home. If you do not have a blood pressure cuff at home, one will be provided for you. Contact your provider if you have not received your monitor within 1 week.   Helpful Tips for Accurate Home Blood Pressure Checks  . Don't smoke, exercise, or drink caffeine 30 minutes before checking your BP . Use the restroom before checking your BP (a full bladder  can raise your pressure) . Relax in a comfortable upright chair . Feet on the ground . Left arm resting comfortably on a flat surface at the level of your heart . Legs uncrossed . Back supported . Sit quietly and don't talk . Place the cuff on your bare arm . Adjust snuggly, so that only two fingertips can fit between your skin and the top of the cuff . Check 2 readings separated by at least one minute . Keep a log of your BP readings . For a visual, please reference this diagram: http://ccnc.care/bpdiagram  Provider Name: [facility]     Phone: [phone]  Zone 1: ALL CLEAR  Continue to monitor your symptoms:  . BP reading is less than 140 (top number) or less than 90 (bottom number)  . No right upper stomach pain . No headaches or seeing spots . No feeling nauseated or throwing up . No swelling in face and hands  Zone 2: CAUTION Call your doctor's office for any of the following:  . BP reading is greater than 140 (top number) or greater than 90 (bottom number)  . Stomach pain under your ribs in the middle or right side . Headaches or seeing spots . Feeling nauseated or throwing up . Swelling in face and hands  Zone 3: EMERGENCY  Seek immediate medical care if you have any of  the following:  . BP reading is greater than160 (top number) or greater than 110 (bottom number) . Severe headaches not improving with Tylenol . Serious difficulty catching your breath . Any worsening symptoms from Zone 2   Braxton Hicks Contractions Contractions of the uterus can occur throughout pregnancy, but they are not always a sign that you are in labor. You may have practice contractions called Braxton Hicks contractions. These false labor contractions are sometimes confused with true labor. What are [name] contractions? Braxton Hicks contractions are tightening movements that occur in the muscles of the uterus before labor. Unlike true labor contractions, these contractions do  not result in opening (dilation) and thinning of the cervix. Toward the end of pregnancy (32-34 weeks), Braxton Hicks contractions can happen more often and may become stronger. These contractions are sometimes difficult to tell apart from true labor because they can be very uncomfortable. You should not feel embarrassed if you go to the hospital with false labor. Sometimes, the only way to tell if you are in true labor is for your health care provider to look for changes in the cervix. The health care provider will do a physical exam and may monitor your contractions. If you are not in true labor, the exam should show that your cervix is not dilating and your water has not broken. If there are no other health problems associated with your pregnancy, it is completely safe for you to be sent home with false labor. You may continue to have Braxton Hicks contractions until you go into true labor. How to tell the difference between true labor and false labor True labor  Contractions last 30-70 seconds.  Contractions become very regular.  Discomfort is usually felt in the top of the uterus, and it spreads to the lower abdomen and low back.  Contractions do not go away with walking.  Contractions usually become more intense and increase in frequency.  The cervix dilates and gets thinner. False labor  Contractions are usually shorter and not as strong as true labor contractions.  Contractions are usually irregular.  Contractions are often felt in the front of the lower abdomen and in the groin.  Contractions may go away when you walk around or change positions while lying down.  Contractions get weaker and are shorter-lasting as time goes on.  The cervix usually does not dilate or become thin. Follow these instructions at home:  1. Take over-the-counter and prescription medicines only as told by your health care provider. 2. Keep up with your usual exercises and follow other instructions  from your health care provider. 3. Eat and drink lightly if you think you are going into labor. 4. If Braxton Hicks contractions are making you uncomfortable: ? Change your position from lying down or resting to walking, or change from walking to resting. ? Sit and rest in a tub of warm water. ? Drink enough fluid to keep your urine pale yellow. Dehydration may cause these contractions. ? Do slow and deep breathing several times an hour. 5. Keep all follow-up prenatal visits as told by your health care provider. This is important. Contact a health care provider if:  You have a fever.  You have continuous pain in your abdomen. Get help right away if:  Your contractions become stronger, more regular, and closer together.  You have fluid leaking or gushing from your vagina.  You pass blood-tinged mucus (bloody show).  You have bleeding from your vagina.  You have low back  pain that you never had before.  You feel your baby's head pushing down and causing pelvic pressure.  Your baby is not moving inside you as much as it used to. Summary  Contractions that occur before labor are called Braxton Hicks contractions, false labor, or practice contractions.  Braxton Hicks contractions are usually shorter, weaker, farther apart, and less regular than true labor contractions. True labor contractions usually become progressively stronger and regular, and they become more frequent.  Manage discomfort from [facility] contractions by changing position, resting in a warm bath, drinking plenty of water, or practicing deep breathing. This information is not intended to replace advice given to you by your health care provider. Make sure you discuss any questions you have with your health care provider. Document Revised: 10/15/2017 Document Reviewed: 03/18/2017 Elsevier Patient Education  [place].

## 2020-12-05 NOTE — Progress Notes (Signed)
[place] 37+1 wks,cephalic,anterior placenta gr 3,afi 23.7 cm,RI .61,.59,.59,.69=80%,BPP 8/8,FHR 152 bpm

## 2020-12-05 NOTE — Progress Notes (Signed)
HIGH-RISK PREGNANCY VISIT Patient name: [name] MRN [id number]  Date of birth: 1986-05-30 Chief Complaint:   Routine Prenatal Visit (U/S today)  History of Present Illness:   [name] is a 35 y.o. G6P1001 female at [redacted]w[redacted]d with an Estimated Date of Delivery: 12/25/20 being seen today for ongoing management of a high-risk pregnancy complicated by chronic hypertension currently on Labetalol 200mg  BID and chronic opiate use.  Today she reports no complaints. DEA database search today reveals oxy 7.5 #85, tramadol #180 and lyrica #30 q month from NP at Dr. . Pt reports only taking oxycodone 1 q 2-3wks. Tramadol 2 daily. Lyrica 1 daily. Hasn't heard from [facility] consult. Fran's last note says that had been trying to call pt but couldn't reach.  Denies ha, visual changes, ruq/epigastric pain, n/v.    Depression screen [facility] 2/9 09/23/2020 07/04/2020 08/31/2018 08/18/2017 09/24/2016  Decreased Interest 1 2 0 0 0  Down, Depressed, Hopeless 0 1 0 0 0  PHQ - 2 Score 1 3 0 0 0  Altered sleeping 1 0 - - -  Tired, decreased energy 0 2 - - -  Change in appetite 0 0 - - -  Feeling bad or failure about yourself  0 0 - - -  Trouble concentrating 0 0 - - -  Moving slowly or fidgety/restless 1 0 - - -  Suicidal thoughts 0 0 - - -  PHQ-9 Score 3 5 - - -  Difficult doing work/chores - - - - -  Some recent data might be hidden    Contractions: Irregular.  .  Movement: Present. denies leaking of fluid.  Review of Systems:   Pertinent items are noted in HPI Denies abnormal vaginal discharge w/ itching/odor/irritation, headaches, visual changes, shortness of breath, chest pain, abdominal pain, severe nausea/vomiting, or problems with urination or bowel movements unless otherwise stated above. Pertinent History Reviewed:  Reviewed past medical,surgical, social, obstetrical and family history.  Reviewed problem list, medications and allergies. Physical Assessment:   Vitals:   12/05/20 1117  BP: (!)  131/92  Pulse: (!) 114  Weight: (!) 359 lb 3.2 oz (162.9 kg)  Body mass index is 57.98 kg/m.           Physical Examination:   General appearance: alert, well appearing, and in no distress  Mental status: alert, oriented to person, place, and time  Skin: warm & dry   Extremities: Edema: Trace    Cardiovascular: normal heart rate noted  Respiratory: normal respiratory effort, no distress  Abdomen: gravid, soft, non-tender  Pelvic: Cervical exam deferred         Fetal Status: Fetal Heart Rate (bpm): 152 u/s   Movement: Present    Fetal Surveillance Testing today: 12/07/20 37+1 wks,cephalic,anterior placenta gr 3,afi 23.7 cm,RI .61,.59,.59,.69=80%,BPP 8/8,FHR 152 bpm  Chaperone: N/A    Results for orders placed or performed in visit on 12/05/20 (from the past 24 hour(s))  POC Urinalysis Dipstick OB   Collection Time: 12/05/20 11:23 AM  Result Value Ref Range   Color, UA     Clarity, UA     Glucose, UA Negative Negative   Bilirubin, UA     Ketones, UA small    Spec Grav, UA     Blood, UA neg    pH, UA     POC,PROTEIN,UA Small (1+) Negative, Trace, Small (1+), Moderate (2+), Large (3+), 4+   Urobilinogen, UA     Nitrite, UA neg    Leukocytes, UA  Negative Negative   Appearance     Odor      Assessment & Plan:  High-risk pregnancy: G2P1001 at [redacted]w[redacted]d with an Estimated Date of Delivery: 12/25/20   1) CHTN, stable on Labetalol 200mg  BID, 1+ proteinuria today, asymptomatic, will get labs. Reviewed pre-e s/s, reasons to seek care  2) Chronic opiate use for LBP, see note above, contacted [facility] again today- who states they will reach back out to pt  Meds: No orders of the defined types were placed in this encounter.  Labs/procedures today: u/s, pre-e labs  Treatment Plan:  2x/wk testing nst alt w/ bpp/dopp, eFW next week (last @ 34wks), IOL @39wks  or earlier if indicated  Reviewed: Preterm labor symptoms and general obstetric precautions including but not limited to vaginal bleeding,  contractions, leaking of fluid and fetal movement were reviewed in detail with the patient.  All questions were answered. Has home bp cuff.  Check bp weekly, let know if >140/90.   Follow-up: Return for As scheduled mon nst/nurse, thurs efw/bpp/dopp hrob.   Future Appointments  Date Time Provider Department Center  12/09/2020  2:50 PM [facility] NURSE [facility]  12/12/2020 10:45 AM [facility] 12/11/2020 [facility] None  12/12/2020 11:50 AM [place], MD [facility]  12/16/2020 10:30 AM [facility] NURSE [facility]  12/19/2020 11:00 AM [facility] 12/18/2020 [facility] None  12/19/2020 11:50 AM [place], CNM [facility]  12/23/2020  2:30 PM [facility] NURSE [facility]  12/26/2020 11:00 AM [facility] 02/20/2021 [facility] None  12/26/2020 11:50 AM [place], MD [facility]  12/30/2020 10:10 AM [facility] NURSE [facility]  01/06/2021 10:10 AM [facility] NURSE [facility]  01/09/2021 11:00 AM [facility] 01/08/2021 [facility] None  01/09/2021 11:50 AM Cresenzo-Dishmon, [place], CNM [facility]    Orders Placed This Encounter  Procedures  . 01/11/2021 OB Follow Up  . Scarlette Calico FETAL BPP WO NON STRESS  . [place] UA Cord Doppler  . CBC  . Comprehensive metabolic panel  . Protein / creatinine ratio, urine  . POC Urinalysis Dipstick OB   US CNM, [facility] 12/05/2020 12:31 PM

## 2020-12-06 ENCOUNTER — Telehealth: Payer: Self-pay | Admitting: Internal Medicine

## 2020-12-06 ENCOUNTER — Inpatient Hospital Stay (HOSPITAL_COMMUNITY)
Admission: AD | Admit: 2020-12-06 | Discharge: 2020-12-09 | DRG: 797 | Disposition: A | Discharge status: Home / Self Care | Payer: Medicaid Other | Attending: Obstetrics and Gynecology | Admitting: Obstetrics and Gynecology

## 2020-12-06 ENCOUNTER — Inpatient Hospital Stay (HOSPITAL_COMMUNITY): Payer: Medicaid Other | Admitting: Anesthesiology

## 2020-12-06 ENCOUNTER — Other Ambulatory Visit: Payer: Self-pay | Admitting: Family Medicine

## 2020-12-06 ENCOUNTER — Other Ambulatory Visit: Payer: Self-pay

## 2020-12-06 DIAGNOSIS — O99824 Streptococcus B carrier state complicating childbirth: Secondary | ICD-10-CM | POA: Diagnosis present

## 2020-12-06 DIAGNOSIS — Z3A37 37 weeks gestation of pregnancy: Secondary | ICD-10-CM

## 2020-12-06 DIAGNOSIS — O1092 Unspecified pre-existing hypertension complicating childbirth: Secondary | ICD-10-CM | POA: Diagnosis not present

## 2020-12-06 DIAGNOSIS — F419 Anxiety disorder, unspecified: Secondary | ICD-10-CM | POA: Diagnosis present

## 2020-12-06 DIAGNOSIS — Z20822 Contact with and (suspected) exposure to covid-19: Secondary | ICD-10-CM | POA: Diagnosis present

## 2020-12-06 DIAGNOSIS — O99344 Other mental disorders complicating childbirth: Secondary | ICD-10-CM | POA: Diagnosis present

## 2020-12-06 DIAGNOSIS — O9832 Other infections with a predominantly sexual mode of transmission complicating childbirth: Secondary | ICD-10-CM | POA: Diagnosis present

## 2020-12-06 DIAGNOSIS — O99892 Other specified diseases and conditions complicating childbirth: Secondary | ICD-10-CM | POA: Diagnosis present

## 2020-12-06 DIAGNOSIS — F418 Other specified anxiety disorders: Secondary | ICD-10-CM | POA: Diagnosis present

## 2020-12-06 DIAGNOSIS — O1002 Pre-existing essential hypertension complicating childbirth: Secondary | ICD-10-CM | POA: Diagnosis present

## 2020-12-06 DIAGNOSIS — O119 Pre-existing hypertension with pre-eclampsia, unspecified trimester: Secondary | ICD-10-CM | POA: Diagnosis present

## 2020-12-06 DIAGNOSIS — Z79891 Long term (current) use of opiate analgesic: Secondary | ICD-10-CM

## 2020-12-06 DIAGNOSIS — O99214 Obesity complicating childbirth: Secondary | ICD-10-CM | POA: Diagnosis present

## 2020-12-06 DIAGNOSIS — R112 Nausea with vomiting, unspecified: Secondary | ICD-10-CM

## 2020-12-06 DIAGNOSIS — Z302 Encounter for sterilization: Secondary | ICD-10-CM

## 2020-12-06 DIAGNOSIS — A6000 Herpesviral infection of urogenital system, unspecified: Secondary | ICD-10-CM | POA: Diagnosis present

## 2020-12-06 DIAGNOSIS — F32A Depression, unspecified: Secondary | ICD-10-CM | POA: Diagnosis present

## 2020-12-06 DIAGNOSIS — Z9851 Tubal ligation status: Secondary | ICD-10-CM

## 2020-12-06 DIAGNOSIS — Z87891 Personal history of nicotine dependence: Secondary | ICD-10-CM

## 2020-12-06 DIAGNOSIS — O0993 Supervision of high risk pregnancy, unspecified, third trimester: Secondary | ICD-10-CM

## 2020-12-06 DIAGNOSIS — O114 Pre-existing hypertension with pre-eclampsia, complicating childbirth: Secondary | ICD-10-CM | POA: Diagnosis present

## 2020-12-06 DIAGNOSIS — G8929 Other chronic pain: Secondary | ICD-10-CM | POA: Diagnosis present

## 2020-12-06 LAB — COMPREHENSIVE METABOLIC PANEL
ALT: 9 IU/L (ref 0–32)
ALT: 13 U/L (ref 0–44)
AST: 24 IU/L (ref 0–40)
AST: 25 U/L (ref 15–41)
Albumin/Globulin Ratio: 1.3 (ref 1.2–2.2)
Albumin: 3.5 g/dL — ABNORMAL LOW (ref 3.8–4.8)
Albumin: 2.4 g/dL — ABNORMAL LOW (ref 3.5–5.0)
Alkaline Phosphatase: 251 IU/L — ABNORMAL HIGH (ref 44–121)
Alkaline Phosphatase: 213 U/L — ABNORMAL HIGH (ref 38–126)
Anion gap: 11 (ref 5–15)
BUN/Creatinine Ratio: 11 (ref 9–23)
BUN: 7 mg/dL (ref 6–20)
BUN: 7 mg/dL (ref 6–20)
Bilirubin Total: 0.5 mg/dL (ref 0.0–1.2)
CO2: 18 mmol/L — ABNORMAL LOW (ref 20–29)
CO2: 19 mmol/L — ABNORMAL LOW (ref 22–32)
Calcium: 9.3 mg/dL (ref 8.7–10.2)
Calcium: 8.9 mg/dL (ref 8.9–10.3)
Chloride: 104 mmol/L (ref 96–106)
Chloride: 106 mmol/L (ref 98–111)
Creatinine, Ser: 0.64 mg/dL (ref 0.57–1.00)
Creatinine, Ser: 0.66 mg/dL (ref 0.44–1.00)
GFR calc Af Amer: 137 mL/min/{1.73_m2} (ref >59)
GFR calc non Af Amer: 118 mL/min/{1.73_m2} (ref >59)
GFR, Estimated: >60 mL/min (ref >60)
Globulin, Total: 2.7 g/dL (ref 1.5–4.5)
Glucose, Bld: 85 mg/dL (ref 70–99)
Glucose: 66 mg/dL (ref 65–99)
Potassium: 4.2 mmol/L (ref 3.5–5.2)
Potassium: 3.8 mmol/L (ref 3.5–5.1)
Sodium: 137 mmol/L (ref 134–144)
Sodium: 136 mmol/L (ref 135–145)
Total Bilirubin: 0.5 mg/dL (ref 0.3–1.2)
Total Protein: 6.2 g/dL (ref 6.0–8.5)
Total Protein: 6.5 g/dL (ref 6.5–8.1)

## 2020-12-06 LAB — CBC
HCT: 33.9 % — ABNORMAL LOW (ref 36.0–46.0)
Hematocrit: 36.5 % (ref 34.0–46.6)
Hemoglobin: 12.5 g/dL (ref 11.1–15.9)
Hemoglobin: 11.6 g/dL — ABNORMAL LOW (ref 12.0–15.0)
MCH: 28.9 pg (ref 26.6–33.0)
MCH: 29.3 pg (ref 26.0–34.0)
MCHC: 34.2 g/dL (ref 31.5–35.7)
MCHC: 34.2 g/dL (ref 30.0–36.0)
MCV: 84 fL (ref 79–97)
MCV: 85.6 fL (ref 80.0–100.0)
Platelets: 238 10*3/uL (ref 150–450)
Platelets: 220 10*3/uL (ref 150–400)
RBC: 4.33 x10E6/uL (ref 3.77–5.28)
RBC: 3.96 MIL/uL (ref 3.87–5.11)
RDW: 14.4 % (ref 11.7–15.4)
RDW: 14.6 % (ref 11.5–15.5)
WBC: 14.1 10*3/uL — ABNORMAL HIGH (ref 3.4–10.8)
WBC: 12.7 10*3/uL — ABNORMAL HIGH (ref 4.0–10.5)
nRBC: 0 % (ref 0.0–0.2)

## 2020-12-06 LAB — SARS CORONAVIRUS 2 (TAT 6-24 HRS): SARS Coronavirus 2: NEGATIVE

## 2020-12-06 LAB — TYPE AND SCREEN
ABO/RH(D): O POS
Antibody Screen: NEGATIVE

## 2020-12-06 LAB — RAPID URINE DRUG SCREEN, HOSP PERFORMED
Amphetamines: NOT DETECTED
Barbiturates: NOT DETECTED
Benzodiazepines: NOT DETECTED
Cocaine: NOT DETECTED
Opiates: NOT DETECTED
Tetrahydrocannabinol: NOT DETECTED

## 2020-12-06 LAB — PROTEIN / CREATININE RATIO, URINE
Creatinine, Urine: 248.4 mg/dL
Creatinine, Urine: 144.99 mg/dL
Protein Creatinine Ratio: 0.23 mg/mg{Cre} — ABNORMAL HIGH (ref 0.00–0.15)
Protein, Ur: 77.9 mg/dL
Protein/Creat Ratio: 314 mg/g creat — ABNORMAL HIGH (ref 0–200)
Total Protein, Urine: 33 mg/dL

## 2020-12-06 MED ORDER — LIDOCAINE HCL (PF) 1 % IJ SOLN: 30.0000 mL | INTRAMUSCULAR | Status: DC | PRN

## 2020-12-06 MED ORDER — ONDANSETRON HCL 4 MG/2ML IJ SOLN
4.0000 mg | Freq: Four times a day (QID) | INTRAMUSCULAR | Status: DC | PRN
  Administered 2020-12-06 – 2020-12-07 (×2): 4 mg via INTRAVENOUS
  Filled 2020-12-06 (×2): qty 2

## 2020-12-06 MED ORDER — OXYCODONE-ACETAMINOPHEN 5-325 MG PO TABS
1.0000 | ORAL_TABLET | ORAL | Status: DC | PRN
  Administered 2020-12-06: 1 via ORAL
  Filled 2020-12-06: qty 1

## 2020-12-06 MED ORDER — LACTATED RINGERS IV SOLN
500.0000 mL | INTRAVENOUS | Status: DC | PRN
  Administered 2020-12-07: 1000 mL via INTRAVENOUS

## 2020-12-06 MED ORDER — PHENYLEPHRINE 40 MCG/ML (10ML) SYRINGE FOR IV PUSH (FOR BLOOD PRESSURE SUPPORT)
80.0000 ug | PREFILLED_SYRINGE | INTRAVENOUS | Status: DC | PRN
  Administered 2020-12-07: 80 ug via INTRAVENOUS
  Filled 2020-12-06: qty 10

## 2020-12-06 MED ORDER — LABETALOL HCL 200 MG PO TABS
200.0000 mg | ORAL_TABLET | Freq: Two times a day (BID) | ORAL | Status: DC
  Administered 2020-12-07: 200 mg via ORAL
  Filled 2020-12-06: qty 1

## 2020-12-06 MED ORDER — TIZANIDINE HCL 2 MG PO TABS
2.0000 mg | ORAL_TABLET | Freq: Every day | ORAL | Status: DC
  Administered 2020-12-06 – 2020-12-08 (×3): 2 mg via ORAL
  Filled 2020-12-06 (×5): qty 1

## 2020-12-06 MED ORDER — ACETAMINOPHEN 325 MG PO TABS: 650.0000 mg | ORAL_TABLET | ORAL | Status: DC | PRN

## 2020-12-06 MED ORDER — PHENYLEPHRINE 40 MCG/ML (10ML) SYRINGE FOR IV PUSH (FOR BLOOD PRESSURE SUPPORT)
80.0000 ug | PREFILLED_SYRINGE | INTRAVENOUS | Status: DC | PRN
  Administered 2020-12-07 (×2): 80 ug via INTRAVENOUS

## 2020-12-06 MED ORDER — DULOXETINE HCL 60 MG PO CPEP
60.0000 mg | ORAL_CAPSULE | Freq: Every day | ORAL | Status: DC
  Administered 2020-12-06 – 2020-12-08 (×3): 60 mg via ORAL
  Filled 2020-12-06 (×5): qty 1

## 2020-12-06 MED ORDER — LIDOCAINE HCL (PF) 1 % IJ SOLN
INTRAMUSCULAR | Status: DC | PRN
  Administered 2020-12-06 (×2): 5 mL via EPIDURAL

## 2020-12-06 MED ORDER — MISOPROSTOL 50MCG HALF TABLET
ORAL_TABLET | ORAL | Status: AC
  Filled 2020-12-06: qty 1

## 2020-12-06 MED ORDER — SOD CITRATE-CITRIC ACID 500-334 MG/5ML PO SOLN: 30.0000 mL | ORAL | Status: DC | PRN

## 2020-12-06 MED ORDER — EPHEDRINE 5 MG/ML INJ
10.0000 mg | INTRAVENOUS | Status: DC | PRN
  Administered 2020-12-07: 5 mg via INTRAVENOUS

## 2020-12-06 MED ORDER — LACTATED RINGERS IV SOLN: 500.0000 mL | Freq: Once | INTRAVENOUS | Status: DC

## 2020-12-06 MED ORDER — TERBUTALINE SULFATE 1 MG/ML IJ SOLN
0.2500 mg | Freq: Once | INTRAMUSCULAR | Status: DC | PRN
  Filled 2020-12-06: qty 1

## 2020-12-06 MED ORDER — OXYTOCIN-SODIUM CHLORIDE 30-0.9 UT/500ML-% IV SOLN
1.0000 m[IU]/min | INTRAVENOUS | Status: DC
  Administered 2020-12-07: 2 m[IU]/min via INTRAVENOUS
  Filled 2020-12-06: qty 500

## 2020-12-06 MED ORDER — OXYTOCIN BOLUS FROM INFUSION: 333.0000 mL | Freq: Once | INTRAVENOUS | Status: DC

## 2020-12-06 MED ORDER — MISOPROSTOL 25 MCG QUARTER TABLET: 25.0000 ug | ORAL_TABLET | ORAL | Status: DC | PRN

## 2020-12-06 MED ORDER — TERBUTALINE SULFATE 1 MG/ML IJ SOLN
0.2500 mg | Freq: Once | INTRAMUSCULAR | Status: AC | PRN
  Administered 2020-12-07: 0.25 mg via SUBCUTANEOUS

## 2020-12-06 MED ORDER — OXYCODONE-ACETAMINOPHEN 5-325 MG PO TABS: 2.0000 | ORAL_TABLET | ORAL | Status: DC | PRN

## 2020-12-06 MED ORDER — PENICILLIN G POT IN DEXTROSE 60000 UNIT/ML IV SOLN
3.0000 10*6.[IU] | INTRAVENOUS | Status: DC
  Administered 2020-12-06 – 2020-12-07 (×4): 3 10*6.[IU] via INTRAVENOUS
  Filled 2020-12-06 (×5): qty 50

## 2020-12-06 MED ORDER — LACTATED RINGERS IV SOLN: INTRAVENOUS | Status: DC

## 2020-12-06 MED ORDER — TRAMADOL HCL 50 MG PO TABS
50.0000 mg | ORAL_TABLET | Freq: Two times a day (BID) | ORAL | Status: DC
  Administered 2020-12-06 – 2020-12-09 (×5): 50 mg via ORAL
  Filled 2020-12-06 (×6): qty 1

## 2020-12-06 MED ORDER — SODIUM CHLORIDE (PF) 0.9 % IJ SOLN
INTRAMUSCULAR | Status: DC | PRN
  Administered 2020-12-06: 12 mL/h via EPIDURAL

## 2020-12-06 MED ORDER — FENTANYL-BUPIVACAINE-NACL 0.5-0.125-0.9 MG/250ML-% EP SOLN
12.0000 mL/h | EPIDURAL | Status: DC | PRN
Start: 2020-12-06 — End: 2020-12-07
  Administered 2020-12-07: 12 mL/h via EPIDURAL
  Filled 2020-12-06 (×2): qty 250

## 2020-12-06 MED ORDER — SODIUM CHLORIDE 0.9 % IV SOLN
5.0000 10*6.[IU] | Freq: Once | INTRAVENOUS | Status: AC
  Administered 2020-12-06: 5 10*6.[IU] via INTRAVENOUS
  Filled 2020-12-06: qty 5

## 2020-12-06 MED ORDER — FENTANYL CITRATE (PF) 100 MCG/2ML IJ SOLN
100.0000 ug | INTRAMUSCULAR | Status: DC | PRN
  Administered 2020-12-06 – 2020-12-07 (×2): 100 ug via INTRAVENOUS
  Filled 2020-12-06 (×2): qty 2

## 2020-12-06 MED ORDER — HYDROXYZINE HCL 25 MG PO TABS: 25.0000 mg | ORAL_TABLET | Freq: Three times a day (TID) | ORAL | Status: DC | PRN

## 2020-12-06 MED ORDER — DIPHENHYDRAMINE HCL 50 MG/ML IJ SOLN: 12.5000 mg | INTRAMUSCULAR | Status: DC | PRN

## 2020-12-06 MED ORDER — OXYTOCIN-SODIUM CHLORIDE 30-0.9 UT/500ML-% IV SOLN: 2.5000 [IU]/h | INTRAVENOUS | Status: DC

## 2020-12-06 MED ORDER — EPHEDRINE 5 MG/ML INJ
10.0000 mg | INTRAVENOUS | Status: DC | PRN
  Filled 2020-12-06: qty 10

## 2020-12-06 MED ORDER — MISOPROSTOL 50MCG HALF TABLET
50.0000 ug | ORAL_TABLET | ORAL | Status: DC | PRN
  Administered 2020-12-06: 50 ug via BUCCAL

## 2020-12-06 MED ORDER — PREGABALIN 50 MG PO CAPS
50.0000 mg | ORAL_CAPSULE | Freq: Every day | ORAL | Status: DC
  Administered 2020-12-06: 50 mg via ORAL
  Filled 2020-12-06: qty 1
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
## 2020-12-06 NOTE — Telephone Encounter (Signed)
Pt reports getting the Moderna covid vaccine. Pt will bring her vaccine card to next dr visit.

## 2020-12-06 NOTE — Anesthesia Procedure Notes (Signed)
Epidural Patient location during procedure: OB Start time: 12/06/2020 11:27 PM End time: 12/06/2020 11:37 PM  Staffing Anesthesiologist: Leonides Grills, MD Performed: anesthesiologist   Preanesthetic Checklist Completed: patient identified, IV checked, site marked, risks and benefits discussed, monitors and equipment checked, pre-op evaluation and timeout performed  Epidural Patient position: sitting Prep: DuraPrep Patient monitoring: heart rate, cardiac monitor, continuous pulse ox and blood pressure Approach: midline Location: L4-L5 Injection technique: LOR air  Needle:  Needle type: Tuohy  Needle gauge: 17 G Needle length: 9 cm Needle insertion depth: 12 cm Catheter type: closed end flexible Catheter size: 19 Gauge Catheter at skin depth: 20 cm Test dose: negative and Other  Assessment Events: blood not aspirated, injection not painful, no injection resistance and negative IV test  Additional Notes Informed consent obtained prior to proceeding including risk of failure, 1% risk of PDPH, risk of minor discomfort and bruising. Discussed alternatives to epidural analgesia and patient desires to proceed.  Timeout performed pre-procedure verifying patient name, procedure, and platelet count.  Patient tolerated procedure well. Reason for block:procedure for pain

## 2020-12-06 NOTE — Progress Notes (Signed)
[name] is a 35 y.o. G2P1001 at [redacted]w[redacted]d by LMP admitted for induction of labor due to [facility] with superimposed PEC.  Subjective: Patient resting comfortably in bed without epidural or pain medication. Patient's mother at bedside for support.  Objective: BP 127/61    Pulse (!) 113    Temp 98 F (36.7 C) (Oral)    Resp 18    Ht 5\' 6"  (1.676 m)    Wt (!) 162.8 kg    LMP 03/20/2020    SpO2 98%    BMI 57.94 kg/m  No intake/output data recorded. No intake/output data recorded.  FHT:  FHR: 145 bpm, variability: moderate,  accelerations:  Present,  decelerations:  Absent UC:   UI noted SVE:   Dilation: 1.5 Effacement (%): Thick Station: -3 Exam by:: [email or web address], CNM Cervical balloon inserted without difficulty; patient tolerated procedure well  Labs: Lab Results  Component Value Date   WBC 12.7 (H) 12/06/2020   HGB 11.6 (L) 12/06/2020   HCT 33.9 (L) 12/06/2020   MCV 85.6 12/06/2020   PLT 220 12/06/2020    Assessment / Plan: Induction of labor due to cHTN with siPEC,  progressing well on pitocin  Labor: Progressing normally Preeclampsia:  labs stable Fetal Wellbeing:  Category I Pain Control:  Labor support without medications I/D:  n/a Anticipated MOD:  NSVD  12/08/2020, CNM 12/06/2020, 8:38 PM

## 2020-12-06 NOTE — Progress Notes (Signed)
Order for admission - patient will be direct admit for SIPE

## 2020-12-06 NOTE — Anesthesia Preprocedure Evaluation (Signed)
Anesthesia Evaluation  Patient identified by MRN, date of birth, ID band Patient awake    Reviewed: Allergy & Precautions, H&P , NPO status , Patient's Chart, lab work & pertinent test results  History of Anesthesia Complications Negative for: history of anesthetic complications  Airway Mallampati: II  TM Distance: >3 FB Neck ROM: full    Dental no notable dental hx. (+) Teeth Intact   Pulmonary former smoker,    Pulmonary exam normal breath sounds clear to auscultation       Cardiovascular hypertension, Normal cardiovascular exam Rhythm:regular Rate:Normal     Neuro/Psych  Headaches, PSYCHIATRIC DISORDERS Anxiety Depression    GI/Hepatic negative GI ROS, Neg liver ROS,   Endo/Other  Morbid obesity  Renal/GU negative Renal ROS  negative genitourinary   Musculoskeletal Disk prolapse   Abdominal (+) + obese,   Peds  Hematology  (+) Blood dyscrasia, anemia ,   Anesthesia Other Findings   Reproductive/Obstetrics (+) Pregnancy Pre-E                             Anesthesia Physical Anesthesia Plan  ASA: IV  Anesthesia Plan: Epidural   Post-op Pain Management:    Induction:   PONV Risk Score and Plan:   Airway Management Planned:   Additional Equipment:   Intra-op Plan:   Post-operative Plan:   Informed Consent: I have reviewed the patients History and Physical, chart, labs and discussed the procedure including the risks, benefits and alternatives for the proposed anesthesia with the patient or authorized representative who has indicated his/her understanding and acceptance.       Plan Discussed with:   Anesthesia Plan Comments:         Anesthesia Quick Evaluation

## 2020-12-07 ENCOUNTER — Encounter (HOSPITAL_COMMUNITY): Payer: Self-pay | Admitting: Family Medicine

## 2020-12-07 DIAGNOSIS — O114 Pre-existing hypertension with pre-eclampsia, complicating childbirth: Secondary | ICD-10-CM

## 2020-12-07 DIAGNOSIS — O99824 Streptococcus B carrier state complicating childbirth: Secondary | ICD-10-CM

## 2020-12-07 DIAGNOSIS — O1092 Unspecified pre-existing hypertension complicating childbirth: Secondary | ICD-10-CM

## 2020-12-07 DIAGNOSIS — Z3A37 37 weeks gestation of pregnancy: Secondary | ICD-10-CM

## 2020-12-07 LAB — CBC
HCT: 36.7 % (ref 36.0–46.0)
Hemoglobin: 11.9 g/dL — ABNORMAL LOW (ref 12.0–15.0)
MCH: 28.6 pg (ref 26.0–34.0)
MCHC: 32.4 g/dL (ref 30.0–36.0)
MCV: 88.2 fL (ref 80.0–100.0)
Platelets: 229 10*3/uL (ref 150–400)
RBC: 4.16 MIL/uL (ref 3.87–5.11)
RDW: 14.5 % (ref 11.5–15.5)
WBC: 19.6 10*3/uL — ABNORMAL HIGH (ref 4.0–10.5)
nRBC: 0 % (ref 0.0–0.2)

## 2020-12-07 LAB — RPR: RPR Ser Ql: NONREACTIVE

## 2020-12-07 MED ORDER — ONDANSETRON HCL 4 MG PO TABS
4.0000 mg | ORAL_TABLET | ORAL | Status: DC | PRN
  Administered 2020-12-08: 4 mg via ORAL
  Filled 2020-12-07: qty 1

## 2020-12-07 MED ORDER — PROMETHAZINE HCL 25 MG/ML IJ SOLN
25.0000 mg | Freq: Once | INTRAMUSCULAR | Status: AC
  Administered 2020-12-07: 25 mg via INTRAVENOUS
  Filled 2020-12-07: qty 1

## 2020-12-07 MED ORDER — WITCH HAZEL-GLYCERIN EX PADS: 1.0000 "application " | MEDICATED_PAD | CUTANEOUS | Status: DC | PRN

## 2020-12-07 MED ORDER — AMLODIPINE BESYLATE 5 MG PO TABS
5.0000 mg | ORAL_TABLET | Freq: Every day | ORAL | Status: DC
  Administered 2020-12-08 – 2020-12-09 (×2): 5 mg via ORAL
  Filled 2020-12-07 (×2): qty 1

## 2020-12-07 MED ORDER — PREGABALIN 50 MG PO CAPS
50.0000 mg | ORAL_CAPSULE | Freq: Every morning | ORAL | Status: DC
  Administered 2020-12-07: 50 mg via ORAL
  Filled 2020-12-07 (×3): qty 1

## 2020-12-07 MED ORDER — SODIUM CHLORIDE 0.9 % IV SOLN: INTRAVENOUS | Status: DC

## 2020-12-07 MED ORDER — SIMETHICONE 80 MG PO CHEW: 80.0000 mg | CHEWABLE_TABLET | ORAL | Status: DC | PRN

## 2020-12-07 MED ORDER — DOCUSATE SODIUM 100 MG PO CAPS
100.0000 mg | ORAL_CAPSULE | Freq: Two times a day (BID) | ORAL | Status: DC
  Filled 2020-12-07 (×2): qty 1

## 2020-12-07 MED ORDER — TRANEXAMIC ACID-NACL 1000-0.7 MG/100ML-% IV SOLN
INTRAVENOUS | Status: AC
  Administered 2020-12-07: 1000 mg
  Filled 2020-12-07: qty 100

## 2020-12-07 MED ORDER — BENZOCAINE-MENTHOL 20-0.5 % EX AERO: 1.0000 "application " | INHALATION_SPRAY | CUTANEOUS | Status: DC | PRN

## 2020-12-07 MED ORDER — PANTOPRAZOLE SODIUM 40 MG PO TBEC
40.0000 mg | DELAYED_RELEASE_TABLET | Freq: Every day | ORAL | Status: DC
  Administered 2020-12-07 – 2020-12-09 (×3): 40 mg via ORAL
  Filled 2020-12-07 (×3): qty 1

## 2020-12-07 MED ORDER — METHYLERGONOVINE MALEATE 0.2 MG/ML IJ SOLN
INTRAMUSCULAR | Status: AC
  Administered 2020-12-07: 0.2 mg
  Filled 2020-12-07: qty 1

## 2020-12-07 MED ORDER — ONDANSETRON HCL 4 MG/2ML IJ SOLN
INTRAMUSCULAR | Status: AC
  Administered 2020-12-07: 4 mg
  Filled 2020-12-07: qty 2

## 2020-12-07 MED ORDER — MISOPROSTOL 200 MCG PO TABS: 800.0000 ug | ORAL_TABLET | Freq: Once | ORAL | Status: DC

## 2020-12-07 MED ORDER — DIBUCAINE (PERIANAL) 1 % EX OINT
1.0000 | TOPICAL_OINTMENT | CUTANEOUS | Status: DC | PRN
Start: 2020-12-07 — End: 2020-12-09

## 2020-12-07 MED ORDER — MISOPROSTOL 200 MCG PO TABS
ORAL_TABLET | ORAL | Status: AC
  Administered 2020-12-07: 800 ug via RECTAL
  Filled 2020-12-07: qty 4

## 2020-12-07 MED ORDER — METHYLERGONOVINE MALEATE 0.2 MG/ML IJ SOLN: 0.2000 mg | Freq: Once | INTRAMUSCULAR | Status: DC

## 2020-12-07 MED ORDER — ONDANSETRON HCL 4 MG/2ML IJ SOLN
4.0000 mg | INTRAMUSCULAR | Status: DC | PRN
  Administered 2020-12-07: 4 mg via INTRAVENOUS
  Filled 2020-12-07: qty 2

## 2020-12-07 MED ORDER — IBUPROFEN 600 MG PO TABS: 600.0000 mg | ORAL_TABLET | Freq: Four times a day (QID) | ORAL | Status: DC

## 2020-12-07 MED ORDER — ERYTHROMYCIN 5 MG/GM OP OINT
TOPICAL_OINTMENT | OPHTHALMIC | Status: AC
  Filled 2020-12-07: qty 1

## 2020-12-07 MED ORDER — PRENATAL MULTIVITAMIN CH: 1.0000 | ORAL_TABLET | Freq: Every day | ORAL | Status: DC

## 2020-12-07 MED ORDER — COCONUT OIL OIL: 1.0000 "application " | TOPICAL_OIL | Status: DC | PRN

## 2020-12-07 MED ORDER — ACETAMINOPHEN 325 MG PO TABS
650.0000 mg | ORAL_TABLET | ORAL | Status: DC | PRN
  Administered 2020-12-08 – 2020-12-09 (×2): 650 mg via ORAL
  Filled 2020-12-07 (×2): qty 2

## 2020-12-07 MED ORDER — TRANEXAMIC ACID-NACL 1000-0.7 MG/100ML-% IV SOLN: 1000.0000 mg | INTRAVENOUS | Status: DC

## 2020-12-07 MED ORDER — SENNOSIDES-DOCUSATE SODIUM 8.6-50 MG PO TABS
2.0000 | ORAL_TABLET | ORAL | Status: DC
  Administered 2020-12-08: 2 via ORAL
  Filled 2020-12-07 (×2): qty 2

## 2020-12-07 MED ORDER — TETANUS-DIPHTH-ACELL PERTUSSIS 5-2.5-18.5 LF-MCG/0.5 IM SUSY: 0.5000 mL | PREFILLED_SYRINGE | Freq: Once | INTRAMUSCULAR | Status: DC

## 2020-12-07 NOTE — Progress Notes (Signed)
[name] is a 35 y.o. G2P1001 at [redacted]w[redacted]d by LMP admitted for induction of labor due to [facility] with superimposed PEC.  Subjective: Patient doing well   Objective: BP 128/69   Pulse (!) 117   Temp 98.5 F (36.9 C) (Oral)   Resp 16   Ht 5\' 6"  (1.676 m)   Wt (!) 162.8 kg   LMP 03/20/2020   SpO2 99%   BMI 57.94 kg/m  I/O last 3 completed shifts: In: -  Out: 600 [Urine:600] Total I/O In: -  Out: 450 [Urine:450]  FHT:  FHR: 145 bpm, variability: moderate,  accelerations:  Present,  decelerations:  Absent UC:   q2-3 min  SVE:   Dilation: 6 Effacement (%): 80 Station: -2 Exam by:: Jayvin Hurrell MD    Labs: Lab Results  Component Value Date   WBC 12.7 (H) 12/06/2020   HGB 11.6 (L) 12/06/2020   HCT 33.9 (L) 12/06/2020   MCV 85.6 12/06/2020   PLT 220 12/06/2020    Assessment / Plan: Induction of labor due to cHTN with siPEC,  progressing well on pitocin  Labor: S/p cytotec, FB. Pit started last evening. AROM at 1030 for light mec fluid. IUPC and FSE placed. Continue to titrate pitocin.  Preeclampsia:  labs stable BP normotensive. Held labetalol overnight. On 200 mg BID.  Fetal Wellbeing:  Category I Pain Control:  Epidural  I/D:  GBS pos on PCN  Anticipated MOD:  NSVD  12/08/2020, MD  12/07/2020, 8:38 PM

## 2020-12-07 NOTE — Anesthesia Procedure Notes (Signed)
Epidural Patient location during procedure: OB Start time: 12/07/2020 2:48 AM End time: 12/07/2020 2:58 AM  Staffing Anesthesiologist: Leonides Grills, MD Performed: anesthesiologist   Preanesthetic Checklist Completed: patient identified, IV checked, site marked, risks and benefits discussed, monitors and equipment checked, pre-op evaluation and timeout performed  Epidural Patient position: sitting Prep: DuraPrep Patient monitoring: heart rate, cardiac monitor, continuous pulse ox and blood pressure Approach: midline Location: L3-L4 Injection technique: LOR air  Needle:  Needle type: Tuohy  Needle gauge: 17 G Needle length: 9 cm Needle insertion depth: 11 cm Catheter type: closed end flexible Catheter size: 19 Gauge Catheter at skin depth: 19 cm Test dose: negative and Other  Assessment Events: blood not aspirated, injection not painful, no injection resistance and negative IV test  Additional Notes Informed consent obtained prior to proceeding including risk of failure, 1% risk of PDPH, risk of minor discomfort and bruising. Discussed alternatives to epidural analgesia and patient desires to proceed.  Timeout performed pre-procedure verifying patient name, procedure, and platelet count.  Patient tolerated procedure well. Reason for block:procedure for pain

## 2020-12-07 NOTE — Discharge Summary (Signed)
Postpartum Discharge Summary    Patient Name: [name] DOB: Oct 22, 1986 MRN: [id number]  Date of admission: 12/06/2020 Delivery date:12/07/2020  Delivering provider: Janet Berlin  Date of discharge: 12/09/2020  Admitting diagnosis: Chronic hypertension with superimposed preeclampsia [O11.9] Intrauterine pregnancy: [redacted]w[redacted]d    Secondary diagnosis:  Active Problems:   [place]   Long-term current use of opiate analgesic   Depression with anxiety   Chronic hypertension with superimposed preeclampsia   Vaginal delivery   Postpartum hemorrhage   Encounter for sterilization   H/O tubal ligation  Additional problems: as noted above Discharge diagnosis: Term Pregnancy Delivered, CHTN with superimposed preeclampsia and PPH                                        Post partum procedures:postpartum tubal ligation Augmentation: AROM, Pitocin, Cytotec and IP Foley Complications: [id number] Hospital course: Induction of Labor With Vaginal Delivery   35y.o. yo G2P1001 at 371w3das admitted to the hospital 12/06/2020 for induction of labor.  Indication for induction: Preeclampsia.  Patient had an uncomplicated labor course as follows: Membrane Rupture Time/Date: 10:15 AM ,12/07/2020   Delivery Method:Vaginal, Spontaneous  Episiotomy: None  Lacerations:  None  Details of delivery can be found in separate delivery note.  Patient had a routine postpartum course. Patient is discharged home 12/09/20.  Newborn Data: Birth date:12/07/2020  Birth time:3:01 PM  Gender:Female  Living status:Living  Apgars:6 ,8  Weight:2690 g   Magnesium Sulfate received: No BMZ received: No Rhophylac:N/A MMR:N/A T-DaP:Given prenatally Flu: Yes Transfusion:No  Physical exam  Vitals:   12/08/20 1825 12/08/20 2230 12/09/20 0608 12/09/20 0915  BP: (!) 150/70 108/63 134/87 (!) 137/91  Pulse: 98 97 100 99  Resp: '16 18 18   ' Temp: 98.7 F (37.1 C) 97.8 F (36.6 C) 98 F (36.7 C)    TempSrc: Oral Oral Oral   SpO2: 100% 99% 100%   Weight:      Height:       General: alert, cooperative and no distress Lochia: appropriate Uterine Fundus: firm Incision: N/A DVT Evaluation: No evidence of DVT seen on physical exam. No cords or calf tenderness. No significant calf/ankle edema. Labs: Lab Results  Component Value Date   WBC 20.3 (H) 12/08/2020   HGB 11.5 (L) 12/08/2020   HCT 34.6 (L) 12/08/2020   MCV 88.5 12/08/2020   PLT 261 12/08/2020   CMP Latest Ref Rng & Units 12/06/2020  Glucose 70 - 99 mg/dL 85  BUN 6 - 20 mg/dL 7  Creatinine 0.44 - 1.00 mg/dL 0.66  Sodium 135 - 145 mmol/L 136  Potassium 3.5 - 5.1 mmol/L 3.8  Chloride 98 - 111 mmol/L 106  CO2 22 - 32 mmol/L 19(L)  Calcium 8.9 - 10.3 mg/dL 8.9  Total Protein 6.5 - 8.1 g/dL 6.5  Total Bilirubin 0.3 - 1.2 mg/dL 0.5  Alkaline Phos 38 - 126 U/L 213(H)  AST 15 - 41 U/L 25  ALT 0 - 44 U/L 13   Edinburgh Score: Edinburgh Postnatal Depression Scale Screening Tool 12/09/2020  I have been able to laugh and see the funny side of things. 0  I have looked forward with enjoyment to things. 0  I have blamed myself unnecessarily when things went wrong. 1  I have been anxious or worried for no good reason. 1  I have felt scared or panicky  for no good reason. 0  Things have been getting on top of me. 1  I have been so unhappy that I have had difficulty sleeping. 0  I have felt sad or miserable. 1  I have been so unhappy that I have been crying. 1  The thought of harming myself has occurred to me. 0  Edinburgh Postnatal Depression Scale Total 5     After visit meds:  Allergies as of 12/09/2020      Reactions   Naproxen Other (See Comments)   Was told by doctor not to take medication due to it messing up her esophagus .      Medication List    STOP taking these medications   aspirin EC 81 MG tablet   labetalol 200 MG tablet Commonly known as: NORMODYNE   ondansetron 4 MG disintegrating  tablet Commonly known as: Zofran ODT   valACYclovir 1000 MG tablet Commonly known as: VALTREX     TAKE these medications   acetaminophen 325 MG tablet Commonly known as: Tylenol Take 2 tablets (650 mg total) by mouth every 6 (six) hours as needed for mild pain, moderate pain, fever or headache (for pain scale < 4).   amLODipine 5 MG tablet Commonly known as: NORVASC Take 1 tablet (5 mg total) by mouth daily.   coconut oil Oil Apply 1 application topically as needed (nipple pain).   DULoxetine 60 MG capsule Commonly known as: CYMBALTA Take 60 mg by mouth at bedtime.   oxyCODONE-acetaminophen 7.5-325 MG tablet Commonly known as: PERCOCET Take 1 tablet by mouth every 8 (eight) hours as needed.   pantoprazole 40 MG tablet Commonly known as: PROTONIX TAKE 1 TABLET (40 MG TOTAL) BY MOUTH 2 (TWO) TIMES DAILY BEFORE A MEAL.   pregabalin 50 MG capsule Commonly known as: LYRICA Take 50 mg by mouth at bedtime.   Prenatal Plus Iron 29-1 MG Tabs Take 1 daily   senna-docusate 8.6-50 MG tablet Commonly known as: Senokot-S Take 2 tablets by mouth at bedtime as needed for mild constipation or moderate constipation.   tiZANidine 2 MG tablet Commonly known as: ZANAFLEX Take 2 mg by mouth at bedtime.   traMADol 50 MG tablet Commonly known as: ULTRAM Take 50-100 mg by mouth every 6 (six) hours as needed for moderate pain.        Discharge home in stable condition Infant Feeding: Bottle Infant Disposition:home with mother Discharge instruction: per After Visit Summary and Postpartum booklet. Activity: Advance as tolerated. Pelvic rest for 6 weeks.  Diet: routine diet Future Appointments: Future Appointments  Date Time Provider [place]  01/06/2021  1:30 PM Roma Schanz, CNM [facility]   Follow up Visit:  Please schedule this patient for a In person postpartum visit in 4 weeks with the following provider: Any provider. Additional Postpartum  F/U:Postpartum Depression checkup and BP check 1 week  High risk pregnancy complicated by: Chronic pain on multiple scheduled medications, morbid obesity, cHTN w SIPEC Delivery mode:  Vaginal, Spontaneous  Anticipated Birth Control: PP BTL performed  Alania Overholt, Gildardo Cranker, MD OB Fellow, Faculty Practice 12/09/2020 9:22 AM

## 2020-12-07 NOTE — Progress Notes (Addendum)
[name] is a 35 y.o. G2P1001 at [redacted]w[redacted]d by LMP admitted for induction of labor due to [facility] with superimposed PEC.  Subjective: Patient having a lot of back pain now   Objective: BP 115/65   Pulse (!) 104   Temp 98 F (36.7 C) (Oral)   Resp 17   Ht 5\' 6"  (1.676 m)   Wt (!) 162.8 kg   LMP 03/20/2020   SpO2 99%   BMI 57.94 kg/m  I/O last 3 completed shifts: In: -  Out: 600 [Urine:600] Total I/O In: -  Out: 450 [Urine:450]  FHT:  Baseline 120, mod variability, no accels, recurrent late decelerations; 10 minute prolonged late to 90's, s/p terb, pit off, repositioned.  UC:   q2-3 min  SVE:   Dilation: 8 Effacement (%): 80 (swollen) Station: 0 Exam by:: Talyah Seder MD    Labs: Lab Results  Component Value Date   WBC 12.7 (H) 12/06/2020   HGB 11.6 (L) 12/06/2020   HCT 33.9 (L) 12/06/2020   MCV 85.6 12/06/2020   PLT 220 12/06/2020    Assessment / Plan: Induction of labor due to cHTN with siPEC,  progressing well on pitocin  Labor: S/p cytotec, FB. Pit started last evening. AROM at 1030 for light mec fluid. IUPC and FSE placed. Prolonged deceleration which resolved with pit off, terb, repositioning, however now with recurrent late decels. Patient made change from 6cm/-2 to 8cm/0 station. Monitor closely. Pit continues to be off.    Preeclampsia:  labs stable BP normotensive. Held labetalol overnight. On 200 mg BID.  Fetal Wellbeing:  Cat II  Pain Control:  Epidural  I/D:  GBS pos on PCN    12/08/2020, MD  12/07/2020

## 2020-12-08 ENCOUNTER — Inpatient Hospital Stay (HOSPITAL_COMMUNITY): Payer: Medicaid Other | Admitting: Anesthesiology

## 2020-12-08 ENCOUNTER — Inpatient Hospital Stay (HOSPITAL_COMMUNITY): Payer: Medicaid Other

## 2020-12-08 ENCOUNTER — Encounter (HOSPITAL_COMMUNITY): Payer: Self-pay | Admitting: Family Medicine

## 2020-12-08 ENCOUNTER — Encounter (HOSPITAL_COMMUNITY)
Admission: AD | Disposition: A | Discharge status: Home / Self Care | Payer: Self-pay | Source: Home / Self Care | Attending: Obstetrics and Gynecology

## 2020-12-08 DIAGNOSIS — Z302 Encounter for sterilization: Secondary | ICD-10-CM

## 2020-12-08 HISTORY — PX: TUBAL LIGATION: SHX77

## 2020-12-08 LAB — CBC
HCT: 34.6 % — ABNORMAL LOW (ref 36.0–46.0)
Hemoglobin: 11.5 g/dL — ABNORMAL LOW (ref 12.0–15.0)
MCH: 29.4 pg (ref 26.0–34.0)
MCHC: 33.2 g/dL (ref 30.0–36.0)
MCV: 88.5 fL (ref 80.0–100.0)
Platelets: 261 10*3/uL (ref 150–400)
RBC: 3.91 MIL/uL (ref 3.87–5.11)
RDW: 14.6 % (ref 11.5–15.5)
WBC: 20.3 10*3/uL — ABNORMAL HIGH (ref 4.0–10.5)
nRBC: 0 % (ref 0.0–0.2)

## 2020-12-08 LAB — SARS CORONAVIRUS 2 (TAT 6-24 HRS): SARS Coronavirus 2: NEGATIVE

## 2020-12-08 SURGERY — LIGATION, FALLOPIAN TUBE, BILATERAL
Anesthesia: Epidural

## 2020-12-08 SURGERY — LIGATION, FALLOPIAN TUBE, POSTPARTUM
Anesthesia: Epidural | Laterality: Bilateral

## 2020-12-08 MED ORDER — OXYCODONE HCL 5 MG/5ML PO SOLN: 5.0000 mg | Freq: Once | ORAL | Status: DC | PRN

## 2020-12-08 MED ORDER — FENTANYL CITRATE (PF) 100 MCG/2ML IJ SOLN: 25.0000 ug | INTRAMUSCULAR | Status: DC | PRN

## 2020-12-08 MED ORDER — MIDAZOLAM HCL 2 MG/2ML IJ SOLN
INTRAMUSCULAR | Status: AC
  Filled 2020-12-08: qty 2

## 2020-12-08 MED ORDER — METOCLOPRAMIDE HCL 5 MG/ML IJ SOLN
10.0000 mg | Freq: Once | INTRAMUSCULAR | Status: AC
  Administered 2020-12-08: 10 mg via INTRAVENOUS
  Filled 2020-12-08: qty 2

## 2020-12-08 MED ORDER — ONDANSETRON HCL 4 MG/2ML IJ SOLN
INTRAMUSCULAR | Status: DC | PRN
  Administered 2020-12-08: 4 mg via INTRAVENOUS

## 2020-12-08 MED ORDER — PROMETHAZINE HCL 25 MG/ML IJ SOLN: 25.0000 mg | Freq: Four times a day (QID) | INTRAMUSCULAR | Status: DC | PRN

## 2020-12-08 MED ORDER — ONDANSETRON HCL 4 MG/2ML IJ SOLN
INTRAMUSCULAR | Status: AC
  Filled 2020-12-08: qty 2

## 2020-12-08 MED ORDER — LIDOCAINE-EPINEPHRINE (PF) 2 %-1:200000 IJ SOLN
INTRAMUSCULAR | Status: DC | PRN
  Administered 2020-12-08: 7 mg via INTRADERMAL
  Administered 2020-12-08 (×2): 5 mg via INTRADERMAL
  Administered 2020-12-08: 3 mg via INTRADERMAL

## 2020-12-08 MED ORDER — MIDAZOLAM HCL 5 MG/5ML IJ SOLN
INTRAMUSCULAR | Status: DC | PRN
  Administered 2020-12-08: 2 mg via INTRAVENOUS

## 2020-12-08 MED ORDER — ACETAMINOPHEN 10 MG/ML IV SOLN: 1000.0000 mg | Freq: Once | INTRAVENOUS | Status: DC | PRN

## 2020-12-08 MED ORDER — OXYCODONE HCL 5 MG PO TABS: 5.0000 mg | ORAL_TABLET | Freq: Once | ORAL | Status: DC | PRN

## 2020-12-08 MED ORDER — METOCLOPRAMIDE HCL 10 MG PO TABS
10.0000 mg | ORAL_TABLET | Freq: Once | ORAL | Status: AC
  Administered 2020-12-08: 10 mg via ORAL
  Filled 2020-12-08: qty 1

## 2020-12-08 MED ORDER — FAMOTIDINE 20 MG PO TABS
40.0000 mg | ORAL_TABLET | Freq: Once | ORAL | Status: AC
  Administered 2020-12-08: 40 mg via ORAL
  Filled 2020-12-08: qty 2

## 2020-12-08 MED ORDER — BUPIVACAINE HCL (PF) 0.25 % IJ SOLN
INTRAMUSCULAR | Status: DC | PRN
  Administered 2020-12-08: 6 mL

## 2020-12-08 MED ORDER — LACTATED RINGERS IV SOLN: INTRAVENOUS | Status: DC | PRN

## 2020-12-08 MED ORDER — FENTANYL CITRATE (PF) 100 MCG/2ML IJ SOLN
INTRAMUSCULAR | Status: AC
  Filled 2020-12-08: qty 2

## 2020-12-08 MED ORDER — BUPIVACAINE HCL (PF) 0.25 % IJ SOLN
INTRAMUSCULAR | Status: AC
  Filled 2020-12-08: qty 10

## 2020-12-08 MED ORDER — PROMETHAZINE HCL 25 MG/ML IJ SOLN: 6.2500 mg | INTRAMUSCULAR | Status: DC | PRN

## 2020-12-08 MED ORDER — KETOROLAC TROMETHAMINE 30 MG/ML IJ SOLN: 30.0000 mg | Freq: Once | INTRAMUSCULAR | Status: DC

## 2020-12-08 MED ORDER — LACTATED RINGERS IV SOLN: INTRAVENOUS | Status: DC

## 2020-12-08 MED ORDER — FENTANYL CITRATE (PF) 100 MCG/2ML IJ SOLN
INTRAMUSCULAR | Status: DC | PRN
  Administered 2020-12-08: 100 ug via EPIDURAL

## 2020-12-08 SURGICAL SUPPLY — 21 items
CLIP FILSHIE TUBAL LIGA STRL (Clip) ×1 IMPLANT
CLOTH BEACON ORANGE TIMEOUT ST (SAFETY) ×2 IMPLANT
DRSG OPSITE POSTOP 3X4 (GAUZE/BANDAGES/DRESSINGS) ×2 IMPLANT
DURAPREP 26ML APPLICATOR (WOUND CARE) ×2 IMPLANT
GLOVE BIOGEL PI IND STRL 7.0 (GLOVE) ×2 IMPLANT
GLOVE BIOGEL PI INDICATOR 7.0 (GLOVE) ×2
GLOVE ECLIPSE 7.0 STRL STRAW (GLOVE) ×4 IMPLANT
GOWN STRL REUS W/TWL LRG LVL3 (GOWN DISPOSABLE) ×4 IMPLANT
NEEDLE HYPO 22GX1.5 SAFETY (NEEDLE) ×2 IMPLANT
NS IRRIG 1000ML POUR BTL (IV SOLUTION) ×2 IMPLANT
PACK ABDOMINAL MINOR (CUSTOM PROCEDURE TRAY) ×2 IMPLANT
PROTECTOR NERVE ULNAR (MISCELLANEOUS) ×2 IMPLANT
SPONGE LAP 4X18 RFD (DISPOSABLE) IMPLANT
SUT MON AB-0 CT1 36 (SUTURE) IMPLANT
SUT VIC AB 0 CT1 27 (SUTURE) ×2
SUT VIC AB 0 CT1 27XBRD ANBCTR (SUTURE) ×1 IMPLANT
SUT VICRYL 4-0 PS2 18IN ABS (SUTURE) ×2 IMPLANT
SYR CONTROL 10ML LL (SYRINGE) ×2 IMPLANT
TOWEL OR 17X24 6PK STRL BLUE (TOWEL DISPOSABLE) ×4 IMPLANT
TRAY FOLEY CATH SILVER 14FR (SET/KITS/TRAYS/PACK) ×2 IMPLANT
WATER STERILE IRR 1000ML POUR (IV SOLUTION) ×2 IMPLANT

## 2020-12-08 NOTE — Transfer of Care (Signed)
Immediate Anesthesia Transfer of Care Note  Patient: [name]  Procedure(s) Performed: POST PARTUM TUBAL LIGATION (Bilateral )  Patient Location: PACU  Anesthesia Type:Epidural  Level of Consciousness: awake and alert   Airway & Oxygen Therapy: Patient Spontanous Breathing  Post-op Assessment: Report given to RN  Post vital signs: Reviewed  Last Vitals:  Vitals Value Taken Time  BP    Temp    Pulse 101 12/08/20 1314  Resp 20 12/08/20 1314  SpO2 100 % 12/08/20 1314  Vitals shown include unvalidated device data.  Last Pain:  Vitals:   12/08/20 1143  TempSrc: Oral  PainSc:          Complications: No complications documented.

## 2020-12-08 NOTE — Anesthesia Postprocedure Evaluation (Signed)
Anesthesia Post Note  Patient: [name]  Procedure(s) Performed: AN AD HOC LABOR EPIDURAL     Patient location during evaluation: Mother Baby Anesthesia Type: Epidural Level of consciousness: awake and alert, oriented and patient cooperative Pain management: pain level controlled Vital Signs Assessment: post-procedure vital signs reviewed and stable Respiratory status: spontaneous breathing Cardiovascular status: stable Postop Assessment: no headache, epidural receding, patient able to bend at knees and no signs of nausea or vomiting Anesthetic complications: no Comments: Pt. States she is bending both knees.     No complications documented.  Last Vitals:  Vitals:   12/08/20 0334 12/08/20 0635  BP: 132/76 111/76  Pulse: 80 80  Resp: 20 20  Temp: 36.9 C 37.4 C  SpO2:  99%    Last Pain:  Vitals:   12/08/20 0635  TempSrc: Oral  PainSc:    Pain Goal:                Epidural/Spinal Function Cutaneous sensation: Normal sensation (12/08/20 0635), Patient able to flex knees: Yes (12/08/20 7062), Patient able to lift hips off bed: Yes (12/08/20 3762), Back pain beyond tenderness at insertion site: No (12/08/20 0635), Progressively worsening motor and/or sensory loss: No (12/08/20 8315), Bowel and/or bladder incontinence post epidural: No (12/08/20 1761)  Merrilyn Puma

## 2020-12-08 NOTE — Anesthesia Preprocedure Evaluation (Addendum)
Anesthesia Evaluation  Patient identified by MRN, date of birth, ID band Patient awake    Reviewed: Allergy & Precautions, NPO status , Patient's Chart, lab work & pertinent test results  Airway Mallampati: II  TM Distance: >3 FB Neck ROM: Full    Dental  (+) Missing   Pulmonary former smoker,    Pulmonary exam normal breath sounds clear to auscultation       Cardiovascular hypertension, Pt. on home beta blockers and Pt. on medications Normal cardiovascular exam Rhythm:Regular Rate:Normal     Neuro/Psych  Headaches, PSYCHIATRIC DISORDERS Anxiety Depression    GI/Hepatic Neg liver ROS, GERD  Controlled and Medicated,  Endo/Other  Morbid obesity  Renal/GU negative Renal ROS     Musculoskeletal negative musculoskeletal ROS (+)   Abdominal (+) + obese,   Peds  Hematology  (+) anemia ,   Anesthesia Other Findings   Reproductive/Obstetrics                            Anesthesia Physical Anesthesia Plan  ASA: IV  Anesthesia Plan: Epidural   Post-op Pain Management:    Induction:   PONV Risk Score and Plan: 2 and Ondansetron, Dexamethasone and Treatment may vary due to age or medical condition  Airway Management Planned: Natural Airway  Additional Equipment:   Intra-op Plan:   Post-operative Plan:   Informed Consent: I have reviewed the patients History and Physical, chart, labs and discussed the procedure including the risks, benefits and alternatives for the proposed anesthesia with the patient or authorized representative who has indicated his/her understanding and acceptance.     Dental advisory given  Plan Discussed with: CRNA  Anesthesia Plan Comments: (Epidural catheter in place)       Anesthesia Quick Evaluation

## 2020-12-08 NOTE — Progress Notes (Signed)
Call Lynnda Shields @2217  to notify her that the patient is nauseous and that she has received Zofran several times and that she is not getting any relief. Also got a verbal order for 0.9 Normal Saline to be ran with the Phenergan order that was given.  Called @0155   To inform her that the patient is still complaining of nausea. States that she will come see the patient.

## 2020-12-08 NOTE — Op Note (Signed)
[name]  12/08/2020  PREOPERATIVE DIAGNOSIS:  Multiparity, undesired fertility  POSTOPERATIVE DIAGNOSIS:  Multiparity, undesired fertility  PROCEDURE:  Postpartum Bilateral Salpingectomy  ANESTHESIA:  Epidural and local analgesia using 0.25% Marcaine  COMPLICATIONS:  None immediate.  ESTIMATED BLOOD LOSS: 100 ml.  INDICATIONS: 35 y.o. F7T0240  with undesired fertility,status post vaginal delivery, desires permanent sterilization.  Other reversible forms of contraception were discussed with patient; she declines all other modalities. Risks of procedure discussed with patient including but not limited to: risk of regret, permanence of method, bleeding, infection, injury to surrounding organs and need for additional procedures.  Failure risk of 0.5-1% with increased risk of ectopic gestation if pregnancy occurs was also discussed with patient.     FINDINGS:  Normal uterus, tubes, and ovaries.  PROCEDURE DETAILS: The patient was taken to the operating room where her spinal anesthesia was dosed up to surgical level and found to be adequate.  She was then placed in a supine position and prepped and draped in the usual sterile fashion.  After an adequate timeout was performed, attention was turned to the patient's abdomen where a small transverse skin incision was made under the umbilical fold. The incision was taken down to the layer of fascia using the scalpel, and fascia was incised, and extended bilaterally. The peritoneum was entered in a sharp fashion. The patient was placed in Trendelenburg.  A moist lap pad was used to move omentum and bowel away until the left fallopian tube was identified and grasped with a Babcock clamp, and followed out to the fimbriated end.  A Kelly clamp was placed across the tube x 2. Tube removed. Heaney stitch x 2 to secure mesosalpinx with excellent hemostasis.  A similar process was carried out on the right side allowing for bilateral tubal sterilization.  Good  hemostasis was noted overall. The instruments were then removed from the patient's abdomen and the fascial incision was repaired with 0 Vicryl, and the skin was closed with a 4-0 Vicryl subcuticular stitch. The patient tolerated the procedure well.  Sponge, lap, and needle counts were correct times two.  The patient was then taken to the recovery room awake, and in stable condition.  Reva Bores MD 12/08/2020 1:29 PM

## 2020-12-08 NOTE — Anesthesia Postprocedure Evaluation (Signed)
Anesthesia Post Note  Patient: ASA FATH  Procedure(s) Performed: POST PARTUM TUBAL LIGATION (Bilateral )     Patient location during evaluation: PACU Anesthesia Type: Epidural Level of consciousness: awake and alert Pain management: pain level controlled Vital Signs Assessment: post-procedure vital signs reviewed and stable Respiratory status: spontaneous breathing, nonlabored ventilation and respiratory function stable Cardiovascular status: stable Postop Assessment: no headache, no backache and epidural receding Anesthetic complications: no   No complications documented.  Last Vitals:  Vitals:   12/08/20 1458 12/08/20 1507  BP:  119/74  Pulse:  (!) 117  Resp: 17 18  Temp:  36.5 C  SpO2:  98%    Last Pain:  Vitals:   12/08/20 1511  TempSrc:   PainSc: 0-No pain   Pain Goal:                Epidural/Spinal Function Cutaneous sensation: Normal sensation (12/08/20 1511), Patient able to flex knees: No (12/08/20 1511), Patient able to lift hips off bed: No (12/08/20 1511), Back pain beyond tenderness at insertion site: No (12/08/20 1511), Progressively worsening motor and/or sensory loss: No (12/08/20 1511), Bowel and/or bladder incontinence post epidural: No (12/08/20 1511)  Ryan P Ellender

## 2020-12-08 NOTE — Progress Notes (Signed)
CSW completed chart review and attempted to meet with MOB in room 507. When CSW arrived CSW was informed that MOB was taken down for a tubal.  CSW will attempt to meet with MOB again on tomorrow (1/24).   Corley Maffeo Boyd-Gilyard, MSW, LCSW Clinical Social Work [phone] 

## 2020-12-08 NOTE — Progress Notes (Signed)
Post Partum Day 1 Subjective: Patient with significant vomiting, s/p zofran x2, phenergan x2, reglan x1, now resolved. Had one loose stool and some URI symptoms, so COVID swab repeated.  Objective: Blood pressure 111/76, pulse 80, temperature 99.3 F (37.4 C), temperature source Oral, resp. rate 20, height 5\' 6"  (1.676 m), weight (!) 162.8 kg, last menstrual period 03/20/2020, SpO2 99 %, unknown if currently breastfeeding.  Physical Exam:  General: alert, cooperative, appears stated age, no distress and morbidly obese Lochia: appropriate Uterine Fundus: firm Incision: n/a DVT Evaluation: No evidence of DVT seen on physical exam.  Recent Labs    12/07/20 1637 12/08/20 0551  HGB 11.9* 11.5*  HCT 36.7 34.6*    Assessment/Plan: Continue current postpartum care, plan for discharge tomorrow.  #BTL: postpartum BTL posted for 1130 presently, pending COVID-19 swab, COVID (-) on 12/06/20. Presently NPO.  #PPH: EBL 1300, CBC 1/23 appropriate at 11.3.  #cHTN with SIPE: By UPC 0.23 labs otherwise WNL, on amlodipine 5 mg, BP normotensive to MR. Continue to monitor.  #N/V: s/p zofran x2, phenergan x2, reglan x1 now improved. COVID swab pending. KUB WNL.  #Chronic pain: continue home meds cymbalta, lyrica, tizanidine. Well controlled.  #MDD: on cymbalta 60 mg, hydroxyzine 25 mg TID prn anxiety. SW consult.   LOS: 2 days   2/23 12/08/2020, 8:27 AM

## 2020-12-09 ENCOUNTER — Other Ambulatory Visit (HOSPITAL_COMMUNITY): Payer: Self-pay | Admitting: Obstetrics and Gynecology

## 2020-12-09 ENCOUNTER — Other Ambulatory Visit: Payer: Medicaid Other

## 2020-12-09 DIAGNOSIS — Z9851 Tubal ligation status: Secondary | ICD-10-CM

## 2020-12-09 MED ORDER — SENNOSIDES-DOCUSATE SODIUM 8.6-50 MG PO TABS: 2.0000 | ORAL_TABLET | Freq: Every evening | ORAL | 1 refills | Status: DC | PRN

## 2020-12-09 MED ORDER — ACETAMINOPHEN 325 MG PO TABS: 650.0000 mg | ORAL_TABLET | Freq: Four times a day (QID) | ORAL | Status: DC | PRN

## 2020-12-09 MED ORDER — AMLODIPINE BESYLATE 5 MG PO TABS: 5.0000 mg | ORAL_TABLET | Freq: Every day | ORAL | 0 refills | Status: DC

## 2020-12-09 MED ORDER — COCONUT OIL OIL: 1.0000 "application " | TOPICAL_OIL | 0 refills | Status: DC | PRN

## 2020-12-09 MED FILL — AMLODIPINE BESYLATE 5 MG TA: 5 | 45 days supply | Qty: 45 | Fill #0

## 2020-12-09 MED FILL — SENEXON-S 8.6-50 MG TABS: 8.6-50 | 30 days supply | Qty: 60 | Fill #0

## 2020-12-09 NOTE — Progress Notes (Signed)
IV was removed 12/08/20 @2253 . Cather was intact.
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
## 2020-12-10 LAB — SURGICAL PATHOLOGY

## 2020-12-12 ENCOUNTER — Other Ambulatory Visit: Payer: Medicaid Other

## 2020-12-12 ENCOUNTER — Encounter: Payer: Medicaid Other | Admitting: Obstetrics & Gynecology

## 2020-12-16 ENCOUNTER — Other Ambulatory Visit: Payer: Medicaid Other

## 2020-12-19 ENCOUNTER — Other Ambulatory Visit: Payer: Medicaid Other

## 2020-12-19 ENCOUNTER — Encounter: Payer: Medicaid Other | Admitting: Advanced Practice Midwife

## 2020-12-23 ENCOUNTER — Other Ambulatory Visit: Payer: Medicaid Other

## 2020-12-26 ENCOUNTER — Encounter: Payer: Medicaid Other | Admitting: Obstetrics & Gynecology

## 2020-12-26 ENCOUNTER — Other Ambulatory Visit: Payer: Medicaid Other

## 2020-12-30 ENCOUNTER — Other Ambulatory Visit: Payer: Medicaid Other

## 2021-01-06 ENCOUNTER — Encounter: Payer: Medicaid Other | Admitting: Women's Health

## 2021-01-06 ENCOUNTER — Other Ambulatory Visit: Payer: Self-pay

## 2021-01-06 ENCOUNTER — Other Ambulatory Visit: Payer: Medicaid Other

## 2021-01-07 NOTE — Progress Notes (Signed)
Pt scheduled as mychart pp visit. Doesn't have good service. Doesn't have bp cuff. Rescheduled for in person. This encounter was created in error - please disregard.

## 2021-01-09 ENCOUNTER — Encounter: Payer: Self-pay | Admitting: Advanced Practice Midwife

## 2021-01-09 ENCOUNTER — Other Ambulatory Visit: Payer: Medicaid Other

## 2021-01-09 ENCOUNTER — Ambulatory Visit (INDEPENDENT_AMBULATORY_CARE_PROVIDER_SITE_OTHER): Payer: Medicaid Other | Admitting: Advanced Practice Midwife

## 2021-01-09 ENCOUNTER — Encounter: Payer: Medicaid Other | Admitting: Advanced Practice Midwife

## 2021-01-09 ENCOUNTER — Other Ambulatory Visit: Payer: Self-pay

## 2021-01-09 MED ORDER — AMLODIPINE BESYLATE 5 MG PO TABS: 5.0000 mg | ORAL_TABLET | Freq: Every day | ORAL | 0 refills | Status: DC

## 2021-01-09 NOTE — Progress Notes (Signed)
Post Partum Visit Note   Chief Complaint:   Postpartum Care  History of Present Illness:   [name] is a 35 y.o. G22P2002 Caucasian female being seen today for a postpartum visit. She is 5 weeks postpartum following a spontaneous vaginal delivery at 37.3 gestational weeks. IOL: Yes, for [facility] w/SIPE. Anesthesia: epidural.  Laceration: none.  Complications: PPH [id number]. Inpatient contraception: yes BTL.   Pregnancy complicated by [facility], chronic opiod use. Tobacco use: no. Substance use disorder: yes chronic opioid prescription. Last pap smear: 08/31/18 and results were NILM w/ HRHPV negative. Next pap smear due: 10/22 Patient's last menstrual period was 01/09/2021 (lmp unknown).  Postpartum course has been uncomplicated. Bleeding staining only. Bowel function is normal. Bladder function is normal. Urinary incontinence? No, fecal incontinence? No Patient is not sexually active. Last sexual activity: prior to delivery.  Desired contraception: BTL done PP. Patient does not want a pregnancy in the future.  Desired family size is 2 children.   Upstream - 01/09/21 3729      Pregnancy Intention Screening   Does the patient want to become pregnant in the next year? No    Does the patient's partner want to become pregnant in the next year? No    Would the patient like to discuss contraceptive options today? No      Contraception Wrap Up   Current Method Female Sterilization    End Method Female Sterilization    Contraception Counseling Provided No          The pregnancy intention screening data noted above was reviewed. Potential methods of contraception were discussed. The patient elected to proceed with Female Sterilization.   Edinburgh Postpartum Depression Screening: Negative  Edinburgh Postnatal Depression Scale - 01/09/21 0929      Edinburgh Postnatal Depression Scale:  In the Past 7 Days   I have been able to laugh and see the funny side of things. 0    I have looked forward with  enjoyment to things. 0    I have blamed myself unnecessarily when things went wrong. 1    I have been anxious or worried for no good reason. 0    I have felt scared or panicky for no good reason. 1    Things have been getting on top of me. 0    I have been so unhappy that I have had difficulty sleeping. 0    I have felt sad or miserable. 0    I have been so unhappy that I have been crying. 0    The thought of harming myself has occurred to me. 0    Edinburgh Postnatal Depression Scale Total 2          Baby's course has been uncomplicated. Baby is feeding by bottle. Infant has a pediatrician/family doctor? Yes.  Childcare strategy if returning to work/school: .  Pt has material needs met for her and baby: Yes.   Review of Systems:   Pertinent items are noted in HPI Denies Abnormal vaginal discharge w/ itching/odor/irritation, headaches, visual changes, shortness of breath, chest pain, abdominal pain, severe nausea/vomiting, or problems with urination or bowel movements. Pertinent History Reviewed:  Reviewed past medical,surgical, obstetrical and family history.  Reviewed problem list, medications and allergies. OB History  Gravida Para Term Preterm AB Living  _0 SAB IAB Ectopic Multiple Live Births        0 2    # Outcome Date GA Lbr Len/2nd  Weight Sex Delivery Anes PTL Lv  2 Term 12/07/20 [id number] / 00:47 5 lb 14.9 oz (2.69 kg) F Vag-Spont EPI  LIV  1 Term 09/15/08 448w0d6 lb 8 oz (2.948 kg) M Vag-Spont EPI N LIV   Physical Assessment:   Vitals:   01/09/21 0933  BP: 127/80  Pulse: (!) 106  Weight: (!) 344 lb (156 kg)  Height: _0  (1.676 m)  Body mass index is 55.52 kg/m.  Objective:  Blood pressure 127/80, pulse (!) 106, height _1  (1.676 m), weight (!) 344 lb (156 kg), last menstrual period 01/09/2021, not currently breastfeeding.  General:  alert, cooperative, and no distress   Breasts:  negative  Lungs: Normal respiratory effort  Heart:  regular  rate and rhythm  Abdomen: soft, non-tender, BTL incision Open for about 3 weeks.  Co exam w/Dr. ErJocelyn LamerPhoto uploaded.  Tissue looks healthy, mild erythema (from bandage and not adjacent to incision).  No drainage or odor. Packed w/NS soaked gauze,   Vulva:  not evaluated  Vagina: not evaluated  Cervix:  normal  Corpus: Well involuted  Adnexa:  not evaluated  Rectal Exam: no hemorrhoids          No results found for this or any previous visit (from the past 24 hour(s)).  Assessment & Plan:  1) Postpartum exam 2) 5 wks s/p spontaneous vaginal delivery 3) bottle feeding 4) Depression screening 5) Contraception BTL in hospital  Essential components of care per ACOG recommendations:  1.  Mood and well being:  . If positive depression screen, discussed and plan developed.  . If using tobacco we discussed reduction/cessation and risk of relapse . If current substance abuse, we discussed and referral to local resources was offered.   2. Infant care and feeding:  . If breastfeeding, discussed returning to work, pumping, breastfeeding-associated pain, guidance regarding return to fertility while lactating if not using another method. If needed, patient was provided with a letter to be allowed to pump q 2-3hrs to support lactation in a private location with access to a refrigerator to store breastmilk.   . Recommended that all caregivers be immunized for flu, pertussis and other preventable communicable diseases . If pt does not have material needs met for her/baby, referred to local resources for help obtaining these.  3. Sexuality, contraception and birth spacing . Provided guidance regarding sexuality, management of dyspareunia, and resumption of intercourse  4. Sleep and fatigue . Discussed coping options for fatigue and sleep disruption . Encouraged family/partner/community support of 4 hrs of uninterrupted sleep to help with mood and fatigue  5. Physical recovery  . If pt had a  C/S, assessed incisional pain and providing guidance on normal vs prolonged recovery . If pt had a laceration, perineal healing and pain reviewed.  . If urinary or fecal incontinence, discussed management and referred to PT or uro/gyn if indicated  . Patient is safe to resume physical activity. Discussed attainment of healthy weight.  6.  Chronic disease management . Discussed pregnancy complications if any, and their implications for future childbearing and long-term maternal health. . Review recommendations for prevention of recurrent pregnancy complications, such as 17 hydroxyprogesterone caproate to reduce risk for recurrent PTB not applicable, or aspirin to reduce risk of preeclampsia not applicable. . Pt had GDM: No. If yes, 2hr GTT scheduled: not applicable. . Reviewed medications and non-pregnant dosing including consideration of whether pt is breastfeeding using a reliable resource such as LactMed: not applicable . Referred  for f/u w/ PCP or subspecialist providers as indicated: yes--plans to see PCP next month about BP maintenance  7. Health maintenance . Mammogram at 35yo or earlier if indicated . Pap smears as indicated  8.  Open incision from BTL  Dr Rip Harbour showed her how to pack it w/NS soaked guaze twice a day; look out for drainage/odor/erythema  Meds:  Meds ordered this encounter  Medications  . amLODipine (NORVASC) 5 MG tablet    Sig: Take 1 tablet (5 mg total) by mouth daily.    Dispense:  45 tablet    Refill:  0    Order Specific Question:   Supervising Provider    Answer:   Florian Buff [[id number]]    Follow-up: Return in about 2 weeks (around 01/23/2021) for wound check (can be virtual ).   No orders of the defined types were placed in this encounter.      [place] for [employer] 01/09/2021 11:21 AM

## 2021-01-16 ENCOUNTER — Ambulatory Visit: Payer: Medicaid Other | Admitting: Women's Health

## 2021-01-22 ENCOUNTER — Ambulatory Visit (INDEPENDENT_AMBULATORY_CARE_PROVIDER_SITE_OTHER): Payer: Medicaid Other | Admitting: Advanced Practice Midwife

## 2021-01-22 ENCOUNTER — Encounter: Payer: Self-pay | Admitting: Advanced Practice Midwife

## 2021-01-22 ENCOUNTER — Other Ambulatory Visit: Payer: Self-pay

## 2021-01-22 VITALS — BP 114/74 | HR 107 | Wt 344.2 lb

## 2021-01-22 DIAGNOSIS — T8131XD Disruption of external operation (surgical) wound, not elsewhere classified, subsequent encounter: Secondary | ICD-10-CM

## 2021-01-22 NOTE — Progress Notes (Signed)
   GYN VISIT Patient name: [name] MRN [id number]  Date of birth: 02/06/1986 Chief Complaint:   Post-op Follow-up  History of Present Illness:   [name] is a 35 y.o. G49P2002 Caucasian female being seen today for f/u of wound care of ppBTL incision (DOS 12/08/20). Had a PP visit on 01/09/21 and was instructed to pack wet>dry bid. States this has been going well w her mother doing the dsg changes. Report some irritation from the tape.   Depression screen [facility] 2/9 09/23/2020 07/04/2020 08/31/2018 08/18/2017 09/24/2016  Decreased Interest 1 2 0 0 0  Down, Depressed, Hopeless 0 1 0 0 0  PHQ - 2 Score 1 3 0 0 0  Altered sleeping 1 0 - - -  Tired, decreased energy 0 2 - - -  Change in appetite 0 0 - - -  Feeling bad or failure about yourself  0 0 - - -  Trouble concentrating 0 0 - - -  Moving slowly or fidgety/restless 1 0 - - -  Suicidal thoughts 0 0 - - -  PHQ-9 Score 3 5 - - -  Difficult doing work/chores - - - - -  Some recent data might be hidden    Patient's last menstrual period was 01/09/2021 (exact date). The current method of family planning is tubal ligation.  Last pap Oct 2019. Results were: NILM w/ HRHPV negative Review of Systems:   Pertinent items are noted in HPI Denies fever/chills, dizziness, headaches, visual disturbances, fatigue, shortness of breath, chest pain, abdominal pain, vomiting, abnormal vaginal discharge/itching/odor/irritation, problems with periods, bowel movements, urination, or intercourse unless otherwise stated above.  Pertinent History Reviewed:  Reviewed past medical,surgical, social, obstetrical and family history.  Reviewed problem list, medications and allergies. Physical Assessment:   Vitals:   01/22/21 1405  BP: 114/74  Pulse: (!) 107  Weight: (!) 344 lb 3.2 oz (156.1 kg)  Body mass index is 55.56 kg/m.       Physical Examination:   General appearance: alert, well appearing, and in no distress  Mental status: alert, oriented to  person, place, and time  Skin: warm & dry   Cardiovascular: normal heart rate noted  Respiratory: normal respiratory effort, no distress  Abdomen: soft, non-tender; ppBTL incision is about half the size as the uploaded photos with +granulation tissue present, no d/c or odor; surrounding skin w tape irritation  Pelvic: examination not indicated  Extremities: no edema     No results found for this or any previous visit (from the past 24 hour(s)).  Assessment & Plan:  1) Open BTL incision> healing; continue to change dsg once daily until completely approximated; call with concerns  2) Tape irritation> rec to get paper tape from pharmacy (we don't have any to give)  Meds: No orders of the defined types were placed in this encounter.   No orders of the defined types were placed in this encounter.   Return for Pap & Physical; Oct 2022.  Arabella Merles CNM 01/22/2021 3:33 PM

## 2021-05-02 ENCOUNTER — Other Ambulatory Visit: Payer: Self-pay | Admitting: Physician Assistant

## 2021-05-02 DIAGNOSIS — K859 Acute pancreatitis without necrosis or infection, unspecified: Secondary | ICD-10-CM

## 2021-05-16 ENCOUNTER — Ambulatory Visit
Admission: RE | Admit: 2021-05-16 | Discharge: 2021-05-16 | Disposition: A | Discharge status: Home / Self Care | Payer: Medicaid Other | Source: Ambulatory Visit | Attending: Physician Assistant | Admitting: Physician Assistant

## 2021-05-16 ENCOUNTER — Other Ambulatory Visit: Payer: Self-pay

## 2021-05-16 DIAGNOSIS — K859 Acute pancreatitis without necrosis or infection, unspecified: Secondary | ICD-10-CM

## 2021-05-16 MED ORDER — GADOBENATE DIMEGLUMINE 529 MG/ML IV SOLN
20.0000 mL | Freq: Once | INTRAVENOUS | Status: AC | PRN
  Administered 2021-05-16: 20 mL via INTRAVENOUS

## 2021-07-22 ENCOUNTER — Other Ambulatory Visit: Payer: Self-pay | Admitting: General Surgery

## 2021-08-13 ENCOUNTER — Emergency Department (HOSPITAL_COMMUNITY): Payer: Medicaid Other

## 2021-08-13 ENCOUNTER — Observation Stay (HOSPITAL_COMMUNITY)
Admission: EM | Admit: 2021-08-13 | Discharge: 2021-08-15 | Disposition: A | Discharge status: Home / Self Care | Payer: Medicaid Other | Attending: Surgery | Admitting: Surgery

## 2021-08-13 ENCOUNTER — Encounter (HOSPITAL_COMMUNITY): Payer: Self-pay

## 2021-08-13 ENCOUNTER — Other Ambulatory Visit: Payer: Self-pay

## 2021-08-13 DIAGNOSIS — R1011 Right upper quadrant pain: Secondary | ICD-10-CM

## 2021-08-13 DIAGNOSIS — K8010 Calculus of gallbladder with chronic cholecystitis without obstruction: Secondary | ICD-10-CM | POA: Diagnosis not present

## 2021-08-13 DIAGNOSIS — Z20822 Contact with and (suspected) exposure to covid-19: Secondary | ICD-10-CM | POA: Diagnosis not present

## 2021-08-13 DIAGNOSIS — Z87891 Personal history of nicotine dependence: Secondary | ICD-10-CM | POA: Diagnosis not present

## 2021-08-13 DIAGNOSIS — Z9049 Acquired absence of other specified parts of digestive tract: Secondary | ICD-10-CM

## 2021-08-13 DIAGNOSIS — K811 Chronic cholecystitis: Secondary | ICD-10-CM

## 2021-08-13 DIAGNOSIS — Z419 Encounter for procedure for purposes other than remedying health state, unspecified: Secondary | ICD-10-CM

## 2021-08-13 LAB — CBC WITH DIFFERENTIAL/PLATELET
Abs Immature Granulocytes: 0.03 10*3/uL (ref 0.00–0.07)
Basophils Absolute: 0.1 10*3/uL (ref 0.0–0.1)
Basophils Relative: 1 %
Eosinophils Absolute: 0.2 10*3/uL (ref 0.0–0.5)
Eosinophils Relative: 2 %
HCT: 43.6 % (ref 36.0–46.0)
Hemoglobin: 14.4 g/dL (ref 12.0–15.0)
Immature Granulocytes: 0 %
Lymphocytes Relative: 17 %
Lymphs Abs: 2 10*3/uL (ref 0.7–4.0)
MCH: 27.9 pg (ref 26.0–34.0)
MCHC: 33 g/dL (ref 30.0–36.0)
MCV: 84.3 fL (ref 80.0–100.0)
Monocytes Absolute: 0.7 10*3/uL (ref 0.1–1.0)
Monocytes Relative: 6 %
Neutro Abs: 8.7 10*3/uL — ABNORMAL HIGH (ref 1.7–7.7)
Neutrophils Relative %: 74 %
Platelets: 323 10*3/uL (ref 150–400)
RBC: 5.17 MIL/uL — ABNORMAL HIGH (ref 3.87–5.11)
RDW: 17 % — ABNORMAL HIGH (ref 11.5–15.5)
WBC: 11.7 10*3/uL — ABNORMAL HIGH (ref 4.0–10.5)
nRBC: 0 % (ref 0.0–0.2)

## 2021-08-13 LAB — RESP PANEL BY RT-PCR (FLU A&B, COVID) ARPGX2
Influenza A by PCR: NEGATIVE
Influenza B by PCR: NEGATIVE
SARS Coronavirus 2 by RT PCR: NEGATIVE

## 2021-08-13 LAB — URINALYSIS, ROUTINE W REFLEX MICROSCOPIC
Glucose, UA: NEGATIVE mg/dL
Ketones, ur: NEGATIVE mg/dL
Nitrite: NEGATIVE
Protein, ur: 100 mg/dL — AB
RBC / HPF: >50 RBC/hpf — ABNORMAL HIGH (ref 0–5)
Specific Gravity, Urine: >=1.03 (ref 1.005–1.030)
pH: 6 (ref 5.0–8.0)

## 2021-08-13 LAB — COMPREHENSIVE METABOLIC PANEL
ALT: 37 U/L (ref 0–44)
AST: 61 U/L — ABNORMAL HIGH (ref 15–41)
Albumin: 4.1 g/dL (ref 3.5–5.0)
Alkaline Phosphatase: 182 U/L — ABNORMAL HIGH (ref 38–126)
Anion gap: 9 (ref 5–15)
BUN: 12 mg/dL (ref 6–20)
CO2: 25 mmol/L (ref 22–32)
Calcium: 9.3 mg/dL (ref 8.9–10.3)
Chloride: 108 mmol/L (ref 98–111)
Creatinine, Ser: 1.06 mg/dL — ABNORMAL HIGH (ref 0.44–1.00)
GFR, Estimated: >60 mL/min (ref >60)
Glucose, Bld: 129 mg/dL — ABNORMAL HIGH (ref 70–99)
Potassium: 3.6 mmol/L (ref 3.5–5.1)
Sodium: 142 mmol/L (ref 135–145)
Total Bilirubin: 1.3 mg/dL — ABNORMAL HIGH (ref 0.3–1.2)
Total Protein: 7.9 g/dL (ref 6.5–8.1)

## 2021-08-13 LAB — LIPASE, BLOOD: Lipase: 36 U/L (ref 11–51)

## 2021-08-13 MED ORDER — PANTOPRAZOLE SODIUM 40 MG PO TBEC
40.0000 mg | DELAYED_RELEASE_TABLET | Freq: Two times a day (BID) | ORAL | Status: DC
  Administered 2021-08-13 – 2021-08-15 (×3): 40 mg via ORAL
  Filled 2021-08-13 (×3): qty 1

## 2021-08-13 MED ORDER — DULOXETINE HCL 60 MG PO CPEP
60.0000 mg | ORAL_CAPSULE | Freq: Every day | ORAL | Status: DC
  Administered 2021-08-14: 60 mg via ORAL
  Filled 2021-08-13: qty 1

## 2021-08-13 MED ORDER — TOPIRAMATE ER 100 MG PO SPRINKLE CAP24
200.0000 mg | EXTENDED_RELEASE_CAPSULE | Freq: Every day | ORAL | Status: DC
  Administered 2021-08-14 – 2021-08-15 (×2): 200 mg via ORAL
  Filled 2021-08-13 (×2): qty 2

## 2021-08-13 MED ORDER — CEFTRIAXONE SODIUM 2 G IJ SOLR
2.0000 g | INTRAMUSCULAR | Status: DC
  Administered 2021-08-13: 2 g via INTRAVENOUS
  Filled 2021-08-13: qty 20

## 2021-08-13 MED ORDER — MORPHINE SULFATE (PF) 2 MG/ML IV SOLN
2.0000 mg | INTRAVENOUS | Status: DC | PRN
  Administered 2021-08-13 – 2021-08-14 (×4): 2 mg via INTRAVENOUS
  Filled 2021-08-13 (×3): qty 1
  Filled 2021-08-13: qty 2

## 2021-08-13 MED ORDER — ONDANSETRON 4 MG PO TBDP: 4.0000 mg | ORAL_TABLET | Freq: Four times a day (QID) | ORAL | Status: DC | PRN

## 2021-08-13 MED ORDER — TOPIRAMATE ER 200 MG PO CAP24: 1.0000 | ORAL_CAPSULE | Freq: Every day | ORAL | Status: DC

## 2021-08-13 MED ORDER — KCL IN DEXTROSE-NACL 20-5-0.45 MEQ/L-%-% IV SOLN
INTRAVENOUS | Status: DC
  Filled 2021-08-13: qty 1000

## 2021-08-13 MED ORDER — AMLODIPINE BESYLATE 5 MG PO TABS: 5.0000 mg | ORAL_TABLET | Freq: Every day | ORAL | Status: DC

## 2021-08-13 MED ORDER — INFLUENZA VAC SPLIT QUAD 0.5 ML IM SUSY: 0.5000 mL | PREFILLED_SYRINGE | INTRAMUSCULAR | Status: DC

## 2021-08-13 MED ORDER — ONDANSETRON HCL 4 MG/2ML IJ SOLN
4.0000 mg | Freq: Four times a day (QID) | INTRAMUSCULAR | Status: DC | PRN
  Administered 2021-08-14: 4 mg via INTRAVENOUS

## 2021-08-13 MED ORDER — FENTANYL CITRATE PF 50 MCG/ML IJ SOSY
100.0000 ug | PREFILLED_SYRINGE | Freq: Once | INTRAMUSCULAR | Status: AC
Start: 2021-08-13 — End: 2021-08-13
  Administered 2021-08-13: 100 ug via INTRAVENOUS
  Filled 2021-08-13: qty 2

## 2021-08-13 MED ORDER — SIMETHICONE 80 MG PO CHEW: 40.0000 mg | CHEWABLE_TABLET | Freq: Four times a day (QID) | ORAL | Status: DC | PRN

## 2021-08-13 MED ORDER — TIZANIDINE HCL 4 MG PO TABS
2.0000 mg | ORAL_TABLET | Freq: Every day | ORAL | Status: DC
  Administered 2021-08-14: 2 mg via ORAL
  Filled 2021-08-13: qty 1

## 2021-08-13 MED ORDER — DIPHENHYDRAMINE HCL 50 MG/ML IJ SOLN: 25.0000 mg | Freq: Four times a day (QID) | INTRAMUSCULAR | Status: DC | PRN

## 2021-08-13 MED ORDER — PREGABALIN 50 MG PO CAPS
50.0000 mg | ORAL_CAPSULE | Freq: Every day | ORAL | Status: DC
  Administered 2021-08-14 – 2021-08-15 (×2): 50 mg via ORAL
  Filled 2021-08-13 (×2): qty 1

## 2021-08-13 MED ORDER — DIPHENHYDRAMINE HCL 25 MG PO CAPS: 25.0000 mg | ORAL_CAPSULE | Freq: Four times a day (QID) | ORAL | Status: DC | PRN

## 2021-08-13 MED ORDER — ACETAMINOPHEN 500 MG PO TABS
1000.0000 mg | ORAL_TABLET | Freq: Four times a day (QID) | ORAL | Status: DC
  Administered 2021-08-13 – 2021-08-15 (×5): 1000 mg via ORAL
  Filled 2021-08-13 (×5): qty 2

## 2021-08-13 MED ORDER — HYDROMORPHONE HCL 1 MG/ML IJ SOLN
1.0000 mg | Freq: Once | INTRAMUSCULAR | Status: AC
Start: 2021-08-13 — End: 2021-08-13
  Administered 2021-08-13: 1 mg via INTRAVENOUS
  Filled 2021-08-13: qty 1

## 2021-08-13 MED ORDER — ENOXAPARIN SODIUM 40 MG/0.4ML IJ SOSY
40.0000 mg | PREFILLED_SYRINGE | Freq: Every day | INTRAMUSCULAR | Status: DC
  Administered 2021-08-13: 40 mg via SUBCUTANEOUS
  Filled 2021-08-13: qty 0.4

## 2021-08-13 MED ORDER — DOCUSATE SODIUM 100 MG PO CAPS
100.0000 mg | ORAL_CAPSULE | Freq: Two times a day (BID) | ORAL | Status: DC
  Administered 2021-08-13 – 2021-08-15 (×3): 100 mg via ORAL
  Filled 2021-08-13 (×3): qty 1

## 2021-08-13 MED ORDER — VALACYCLOVIR HCL 500 MG PO TABS
1000.0000 mg | ORAL_TABLET | Freq: Every day | ORAL | Status: DC
  Administered 2021-08-14: 1000 mg via ORAL
  Filled 2021-08-13 (×2): qty 2

## 2021-08-13 NOTE — ED Triage Notes (Signed)
Pt presents with c/o abdominal pain. Pt reports she called her surgeon and was told to come over here for possible evaluation of her gallbladder.

## 2021-08-13 NOTE — Plan of Care (Signed)
  Problem: Activity: Goal: Risk for activity intolerance will decrease Outcome: Progressing   Problem: Coping: Goal: Level of anxiety will decrease Outcome: Progressing   Problem: Pain Managment: Goal: General experience of comfort will improve Outcome: Progressing   Problem: Safety: Goal: Ability to remain free from injury will improve Outcome: Progressing   

## 2021-08-13 NOTE — ED Notes (Signed)
ED TO INPATIENT HANDOFF REPORT  ED Nurse Name and Phone #: Charise Carwin, RN [phone]  S Name/Age/Gender [name] 35 y.o. female Room/Bed: WA11/WA11  Code Status   Code Status: Prior  Home/SNF/Other Home Patient oriented to: self, place, time, and situation Is this baseline? Yes   Triage Complete: Triage complete  Chief Complaint Chronic cholecystitis with calculus [K80.10]  Triage Note Pt presents with c/o abdominal pain. Pt reports she called her surgeon and was told to come over here for possible evaluation of her gallbladder.    Allergies Allergies  Allergen Reactions   Tape Itching   Tapentadol Itching   Naproxen Other (See Comments)    Was told by doctor not to take medication due to it messing up her esophagus .    Level of Care/Admitting Diagnosis ED Disposition     ED Disposition  Admit   Condition  --   Comment  Hospital Area: [facility] [[id number]]  Level of Care: Med-Surg [16]  May place patient in observation at [facility] or [facility] if equivalent level of care is available:: No  Covid Evaluation: Confirmed COVID Negative  Diagnosis: Chronic cholecystitis with calculus [784696]  Admitting Physician: CCS, MD [[id number]]  Attending Physician: CCS, MD [[id number]]          B Medical/Surgery History Past Medical History:  Diagnosis Date   Abdominal pain, chronic, epigastric    Abdominal pain, chronic, right lower quadrant    Anxiety    Bulging disc    Constipation    Contraceptive management 07/18/2014   Depression    Disk prolapse    Elevated liver enzymes 12/01/2013   Fatty liver 07/23/2017   Gastritis    GERD (gastroesophageal reflux disease)    Headache    Herpes    Mental disorder    anxiety   Obesity    Other and unspecified ovarian cyst 12/01/2013   Ovarian cyst, right    Splenomegaly 07/23/2017   Past Surgical History:  Procedure Laterality Date   COLONOSCOPY WITH ESOPHAGOGASTRODUODENOSCOPY (EGD) N/A 02/24/2013    EXB:[id number] RECTAL BLEEDING DUE TO Moderate sized internal hemorrhoids, EGD: erosive esophagitis due to uncontrolled reflux, moderate erosive gastritis, benign path   ESOPHAGOGASTRODUODENOSCOPY  01/19/2012   VOZ:DGUYQI,HKVQQVZD IN THE ANTRUM/HIATAL HERNIA/   ESOPHAGOGASTRODUODENOSCOPY N/A 07/31/2016   Procedure: ESOPHAGOGASTRODUODENOSCOPY (EGD);  Surgeon: West Bali, MD;  Location: [facility];  Service: Endoscopy;  Laterality: N/A;  10:15 AM   TUBAL LIGATION Bilateral 12/08/2020   Procedure: POST PARTUM TUBAL LIGATION;  Surgeon: Reva Bores, MD;  Location: [facility] LD ORS;  Service: Gynecology;  Laterality: Bilateral;   WISDOM TOOTH EXTRACTION       A IV Location/Drains/Wounds Patient Lines/Drains/Airways Status     Active Line/Drains/Airways     Name Placement date Placement time Site Days   Peripheral IV 08/13/21 20 G 1" Right Antecubital 08/13/21  1912  Antecubital  less than 1   Incision (Closed) 12/08/20 Abdomen 12/08/20  1238  -- 248            Intake/Output Last 24 hours No intake or output data in the 24 hours ending 08/13/21 2128  Labs/Imaging Results for orders placed or performed during the hospital encounter of 08/13/21 (from the past 48 hour(s))  CBC with Differential     Status: Abnormal   Collection Time: 08/13/21  6:55 PM  Result Value Ref Range   WBC 11.7 (H) 4.0 - 10.5 K/uL   RBC 5.17 (H) 3.87 -  5.11 MIL/uL   Hemoglobin 14.4 12.0 - 15.0 g/dL   HCT 93.7 90.2 - 40.9 %   MCV 84.3 80.0 - 100.0 fL   MCH 27.9 26.0 - 34.0 pg   MCHC 33.0 30.0 - 36.0 g/dL   RDW 73.5 (H) 32.9 - 92.4 %   Platelets 323 150 - 400 K/uL   nRBC 0.0 0.0 - 0.2 %   Neutrophils Relative % 74 %   Neutro Abs 8.7 (H) 1.7 - 7.7 K/uL   Lymphocytes Relative 17 %   Lymphs Abs 2.0 0.7 - 4.0 K/uL   Monocytes Relative 6 %   Monocytes Absolute 0.7 0.1 - 1.0 K/uL   Eosinophils Relative 2 %   Eosinophils Absolute 0.2 0.0 - 0.5 K/uL   Basophils Relative 1 %   Basophils Absolute 0.1 0.0 -  0.1 K/uL   Immature Granulocytes 0 %   Abs Immature Granulocytes 0.03 0.00 - 0.07 K/uL    Comment: Performed at [facility], [address] [place], [place] [address]  Comprehensive metabolic panel     Status: Abnormal   Collection Time: 08/13/21  6:55 PM  Result Value Ref Range   Sodium 142 135 - 145 mmol/L   Potassium 3.6 3.5 - 5.1 mmol/L   Chloride 108 98 - 111 mmol/L   CO2 25 22 - 32 mmol/L   Glucose, Bld 129 (H) 70 - 99 mg/dL    Comment: Glucose reference range applies only to samples taken after fasting for at least 8 hours.   BUN 12 6 - 20 mg/dL   Creatinine, Ser 1.96 (H) 0.44 - 1.00 mg/dL   Calcium 9.3 8.9 - 22.2 mg/dL   Total Protein 7.9 6.5 - 8.1 g/dL   Albumin 4.1 3.5 - 5.0 g/dL   AST 61 (H) 15 - 41 U/L   ALT 37 0 - 44 U/L   Alkaline Phosphatase 182 (H) 38 - 126 U/L   Total Bilirubin 1.3 (H) 0.3 - 1.2 mg/dL   GFR, Estimated >97 >98 mL/min    Comment: (NOTE) Calculated using the CKD-EPI Creatinine Equation (2021)    Anion gap 9 5 - 15    Comment: Performed at [facility], [address], [place], [place] [address]  Lipase, blood     Status: None   Collection Time: 08/13/21  6:55 PM  Result Value Ref Range   Lipase 36 11 - 51 U/L    Comment: Performed at [facility], [address] [place], [place] [address]  Urinalysis, Routine w reflex microscopic     Status: Abnormal   Collection Time: 08/13/21  6:55 PM  Result Value Ref Range   Color, Urine AMBER (A) YELLOW    Comment: BIOCHEMICALS MAY BE AFFECTED BY COLOR   APPearance HAZY (A) CLEAR   Specific Gravity, Urine >=1.030 1.005 - 1.030   pH 6.0 5.0 - 8.0   Glucose, UA NEGATIVE NEGATIVE mg/dL   Hgb urine dipstick LARGE (A) NEGATIVE   Bilirubin Urine SMALL (A) NEGATIVE   Ketones, ur NEGATIVE NEGATIVE mg/dL   Protein, ur 814 (A) NEGATIVE mg/dL   Nitrite NEGATIVE NEGATIVE   Leukocytes,Ua TRACE (A) NEGATIVE   RBC / HPF >50 (H) 0 - 5 RBC/hpf   WBC, UA  11-20 0 - 5 WBC/hpf   Bacteria, UA FEW (A) NONE SEEN   Squamous Epithelial / LPF 0-5 0 - 5   Mucus PRESENT     Comment: Performed at [facility],  [address] [place], [place] [address]  Resp Panel by RT-PCR (Flu A&B, Covid)     Status: None   Collection Time: 08/13/21  6:55 PM   Specimen: Nasopharyngeal(NP) swabs in vial transport medium  Result Value Ref Range   SARS Coronavirus 2 by RT PCR NEGATIVE NEGATIVE    Comment: (NOTE) SARS-CoV-2 target nucleic acids are NOT DETECTED.  The SARS-CoV-2 RNA is generally detectable in upper respiratory specimens during the acute phase of infection. The lowest concentration of SARS-CoV-2 viral copies this assay can detect is 138 copies/mL. A negative result does not preclude SARS-Cov-2 infection and should not be used as the sole basis for treatment or other patient management decisions. A negative result may occur with  improper specimen collection/handling, submission of specimen other than nasopharyngeal swab, presence of viral mutation(s) within the areas targeted by this assay, and inadequate number of viral copies(<138 copies/mL). A negative result must be combined with clinical observations, patient history, and epidemiological information. The expected result is Negative.  Fact Sheet for Patients:  BloggerCourse.com  Fact Sheet for Healthcare Providers:  SeriousBroker.it  This test is no t yet approved or cleared by the [place] FDA and  has been authorized for detection and/or diagnosis of SARS-CoV-2 by FDA under an Emergency Use Authorization (EUA). This EUA will remain  in effect (meaning this test can be used) for the duration of the COVID-19 declaration under Section 564(b)(1) of the Act, 21 U.S.C.section 360bbb-3(b)(1), unless the authorization is terminated  or revoked sooner.       Influenza A by PCR NEGATIVE NEGATIVE   Influenza B by PCR  NEGATIVE NEGATIVE    Comment: (NOTE) The Xpert Xpress SARS-CoV-2/FLU/RSV plus assay is intended as an aid in the diagnosis of influenza from Nasopharyngeal swab specimens and should not be used as a sole basis for treatment. Nasal washings and aspirates are unacceptable for Xpert Xpress SARS-CoV-2/FLU/RSV testing.  Fact Sheet for Patients: BloggerCourse.com  Fact Sheet for Healthcare Providers: SeriousBroker.it  This test is not yet approved or cleared by the [place] FDA and has been authorized for detection and/or diagnosis of SARS-CoV-2 by FDA under an Emergency Use Authorization (EUA). This EUA will remain in effect (meaning this test can be used) for the duration of the COVID-19 declaration under Section 564(b)(1) of the Act, 21 U.S.C. section 360bbb-3(b)(1), unless the authorization is terminated or revoked.  Performed at [facility], [address], [place], [place] [address]    US Abdomen Limited RUQ (LIVER/GB)  Result Date: 08/13/2021 CLINICAL DATA:  Right upper quadrant pain, history of pancreatitis and pancreatic divisum, history of cholelithiasis EXAM: ULTRASOUND ABDOMEN LIMITED RIGHT UPPER QUADRANT COMPARISON:  05/16/2021 FINDINGS: Gallbladder: Multiple small shadowing gallstones layer dependently within the gallbladder. No evidence of gallbladder wall thickening or pericholecystic fluid. Negative sonographic Murphy sign. Common bile duct: Diameter: 8 mm Liver: No focal lesion identified. Within normal limits in parenchymal echogenicity. Portal vein is patent on color Doppler imaging with normal direction of blood flow towards the liver. Other: None. IMPRESSION: 1. Cholelithiasis without cholecystitis. Electronically Signed   By: Sharlet Salina M.D.   On: 08/13/2021 18:51    Pending Labs Unresulted Labs (From admission, onward)    None       Vitals/Pain Today's Vitals   08/13/21 2011  08/13/21 2030 08/13/21 2100 08/13/21 2119  BP: 112/76 108/70 105/70   Pulse: 91 87 97   Resp: 17 19 16    Temp:  TempSrc:      SpO2: 98% 97% 98%   PainSc:    6     Isolation Precautions No active isolations  Medications Medications  influenza vac split quadrivalent PF (FLUARIX) injection 0.5 mL (has no administration in time range)  fentaNYL (SUBLIMAZE) injection 100 mcg (100 mcg Intravenous Given 08/13/21 1938)  HYDROmorphone (DILAUDID) injection 1 mg (1 mg Intravenous Given 08/13/21 2046)    Mobility walks Low fall risk   Focused Assessments    R Recommendations: See Admitting Provider Note  Report given to:   Additional Notes: '

## 2021-08-13 NOTE — H&P (Signed)
CC: RUQ pain  Requesting provider: Dr Rubin Payor  HPI: [name] is an 35 y.o. female who is here for RUQ pain.  She is currently scheduled for lap chole with Dr Donell Beers on Nov 4, but came to the ED in [place] last night with nausea, vomiting and RUQ pain.  Lipase was mildly elevated but remainder of workup was neg for cholecystitis.  She had continued pain and was directed to the ED here by our office earlier today.  She states the pain is still getting worse.  Denies any fevers.    Past Medical History:  Diagnosis Date   Abdominal pain, chronic, epigastric    Abdominal pain, chronic, right lower quadrant    Anxiety    Bulging disc    Constipation    Contraceptive management 07/18/2014   Depression    Disk prolapse    Elevated liver enzymes 12/01/2013   Fatty liver 07/23/2017   Gastritis    GERD (gastroesophageal reflux disease)    Headache    Herpes    Mental disorder    anxiety   Obesity    Other and unspecified ovarian cyst 12/01/2013   Ovarian cyst, right    Splenomegaly 07/23/2017    Past Surgical History:  Procedure Laterality Date   COLONOSCOPY WITH ESOPHAGOGASTRODUODENOSCOPY (EGD) N/A 02/24/2013   VCB:[id number] RECTAL BLEEDING DUE TO Moderate sized internal hemorrhoids, EGD: erosive esophagitis due to uncontrolled reflux, moderate erosive gastritis, benign path   ESOPHAGOGASTRODUODENOSCOPY  01/19/2012   YKZ:[id number],[id number] IN THE ANTRUM/HIATAL HERNIA/   ESOPHAGOGASTRODUODENOSCOPY N/A 07/31/2016   Procedure: ESOPHAGOGASTRODUODENOSCOPY (EGD);  Surgeon: West Bali, MD;  Location: [facility];  Service: Endoscopy;  Laterality: N/A;  10:15 AM   TUBAL LIGATION Bilateral 12/08/2020   Procedure: POST PARTUM TUBAL LIGATION;  Surgeon: Reva Bores, MD;  Location: [facility] LD ORS;  Service: Gynecology;  Laterality: Bilateral;   WISDOM TOOTH EXTRACTION      Family History  Problem Relation Age of Onset   GER disease Mother    Other Mother        diverticulitis; hernia    Diabetes Mother        borderline   Anxiety disorder Mother    Depression Mother    Diabetes Maternal Grandmother    COPD Maternal Grandmother    Diabetes Maternal Grandfather    Congestive Heart Failure Maternal Grandfather    Headache Son    ADD / ADHD Son    Diabetes Other    Colon cancer Neg Hx    Colon polyps Neg Hx    Gastric cancer Neg Hx    Esophageal cancer Neg Hx     Social:  reports that she quit smoking about 10 years ago. Her smoking use included cigarettes. She has never used smokeless tobacco. She reports that she does not drink alcohol and does not use drugs.  Allergies:  Allergies  Allergen Reactions   Tape Itching   Naproxen Other (See Comments)    Was told by doctor not to take medication due to it messing up her esophagus .    Medications:  Current Facility-Administered Medications:    [START ON 08/14/2021] influenza vac split quadrivalent PF (FLUARIX) injection 0.5 mL, 0.5 mL, Intramuscular, Tomorrow-1000, Benjiman Core, MD  Current Outpatient Medications:    amLODipine (NORVASC) 5 MG tablet, Take 1 tablet (5 mg total) by mouth daily., Disp: 45 tablet, Rfl: 0   DULoxetine (CYMBALTA) 60 MG capsule, Take 60 mg by mouth at bedtime., Disp: ,  Rfl:    ondansetron (ZOFRAN-ODT) 4 MG disintegrating tablet, ondansetron 4 mg disintegrating tablet  DISSOLVE 1 TABLET IN MOUTH EVERY 6 HOURS AS NEEDED FOR NAUSEA, Disp: , Rfl:    oxyCODONE-acetaminophen (PERCOCET) 7.5-325 MG tablet, Take 1 tablet by mouth every 8 (eight) hours as needed., Disp: , Rfl:    pantoprazole (PROTONIX) 40 MG tablet, TAKE 1 TABLET (40 MG TOTAL) BY MOUTH 2 (TWO) TIMES DAILY BEFORE A MEAL., Disp: 60 tablet, Rfl: 10   pregabalin (LYRICA) 50 MG capsule, Take 50 mg by mouth at bedtime., Disp: , Rfl:    senna-docusate (SENOKOT-S) 8.6-50 MG tablet, Senexon-S 8.6 mg-50 mg tablet  TAKE 2 TABLETS BY MOUTH AT BEDTIME AS NEEDED FOR MILD CONSTIPATION OR MODERATE CONSTIPATION., Disp: , Rfl:    tiZANidine  (ZANAFLEX) 2 MG tablet, Take 2 mg by mouth at bedtime., Disp: , Rfl:    traMADol (ULTRAM) 50 MG tablet, Take 50-100 mg by mouth every 6 (six) hours as needed for moderate pain., Disp: , Rfl:    valACYclovir (VALTREX) 1000 MG tablet, Take 1,000 mg by mouth daily., Disp: , Rfl:   Results for orders placed or performed during the hospital encounter of 08/13/21 (from the past 48 hour(s))  CBC with Differential     Status: Abnormal   Collection Time: 08/13/21  6:55 PM  Result Value Ref Range   WBC 11.7 (H) 4.0 - 10.5 K/uL   RBC 5.17 (H) 3.87 - 5.11 MIL/uL   Hemoglobin 14.4 12.0 - 15.0 g/dL   HCT 32.9 51.8 - 84.1 %   MCV 84.3 80.0 - 100.0 fL   MCH 27.9 26.0 - 34.0 pg   MCHC 33.0 30.0 - 36.0 g/dL   RDW 66.0 (H) 63.0 - 16.0 %   Platelets 323 150 - 400 K/uL   nRBC 0.0 0.0 - 0.2 %   Neutrophils Relative % 74 %   Neutro Abs 8.7 (H) 1.7 - 7.7 K/uL   Lymphocytes Relative 17 %   Lymphs Abs 2.0 0.7 - 4.0 K/uL   Monocytes Relative 6 %   Monocytes Absolute 0.7 0.1 - 1.0 K/uL   Eosinophils Relative 2 %   Eosinophils Absolute 0.2 0.0 - 0.5 K/uL   Basophils Relative 1 %   Basophils Absolute 0.1 0.0 - 0.1 K/uL   Immature Granulocytes 0 %   Abs Immature Granulocytes 0.03 0.00 - 0.07 K/uL    Comment: Performed at [facility], [address], [place], [place] [address]  Comprehensive metabolic panel     Status: Abnormal   Collection Time: 08/13/21  6:55 PM  Result Value Ref Range   Sodium 142 135 - 145 mmol/L   Potassium 3.6 3.5 - 5.1 mmol/L   Chloride 108 98 - 111 mmol/L   CO2 25 22 - 32 mmol/L   Glucose, Bld 129 (H) 70 - 99 mg/dL    Comment: Glucose reference range applies only to samples taken after fasting for at least 8 hours.   BUN 12 6 - 20 mg/dL   Creatinine, Ser 3.55 (H) 0.44 - 1.00 mg/dL   Calcium 9.3 8.9 - 73.2 mg/dL   Total Protein 7.9 6.5 - 8.1 g/dL   Albumin 4.1 3.5 - 5.0 g/dL   AST 61 (H) 15 - 41 U/L   ALT 37 0 - 44 U/L   Alkaline Phosphatase 182 (H) 38 - 126  U/L   Total Bilirubin 1.3 (H) 0.3 - 1.2 mg/dL   GFR, Estimated >20 >25 mL/min  Comment: (NOTE) Calculated using the CKD-EPI Creatinine Equation (2021)    Anion gap 9 5 - 15    Comment: Performed at [facility], [address], [place], [place] [address]  Lipase, blood     Status: None   Collection Time: 08/13/21  6:55 PM  Result Value Ref Range   Lipase 36 11 - 51 U/L    Comment: Performed at [facility], [address] [place], [place] [address]  Urinalysis, Routine w reflex microscopic     Status: Abnormal   Collection Time: 08/13/21  6:55 PM  Result Value Ref Range   Color, Urine AMBER (A) YELLOW    Comment: BIOCHEMICALS MAY BE AFFECTED BY COLOR   APPearance HAZY (A) CLEAR   Specific Gravity, Urine >=1.030 1.005 - 1.030   pH 6.0 5.0 - 8.0   Glucose, UA NEGATIVE NEGATIVE mg/dL   Hgb urine dipstick LARGE (A) NEGATIVE   Bilirubin Urine SMALL (A) NEGATIVE   Ketones, ur NEGATIVE NEGATIVE mg/dL   Protein, ur 098 (A) NEGATIVE mg/dL   Nitrite NEGATIVE NEGATIVE   Leukocytes,Ua TRACE (A) NEGATIVE   RBC / HPF >50 (H) 0 - 5 RBC/hpf   WBC, UA 11-20 0 - 5 WBC/hpf   Bacteria, UA FEW (A) NONE SEEN   Squamous Epithelial / LPF 0-5 0 - 5   Mucus PRESENT     Comment: Performed at [facility], [address], [place], [place] [address]  Resp Panel by RT-PCR (Flu A&B, Covid)     Status: None   Collection Time: 08/13/21  6:55 PM   Specimen: Nasopharyngeal(NP) swabs in vial transport medium  Result Value Ref Range   SARS Coronavirus 2 by RT PCR NEGATIVE NEGATIVE    Comment: (NOTE) SARS-CoV-2 target nucleic acids are NOT DETECTED.  The SARS-CoV-2 RNA is generally detectable in upper respiratory specimens during the acute phase of infection. The lowest concentration of SARS-CoV-2 viral copies this assay can detect is 138 copies/mL. A negative result does not preclude SARS-Cov-2 infection and should not be used as the sole basis for  treatment or other patient management decisions. A negative result may occur with  improper specimen collection/handling, submission of specimen other than nasopharyngeal swab, presence of viral mutation(s) within the areas targeted by this assay, and inadequate number of viral copies(<138 copies/mL). A negative result must be combined with clinical observations, patient history, and epidemiological information. The expected result is Negative.  Fact Sheet for Patients:  BloggerCourse.com  Fact Sheet for Healthcare Providers:  SeriousBroker.it  This test is no t yet approved or cleared by the [place] FDA and  has been authorized for detection and/or diagnosis of SARS-CoV-2 by FDA under an Emergency Use Authorization (EUA). This EUA will remain  in effect (meaning this test can be used) for the duration of the COVID-19 declaration under Section 564(b)(1) of the Act, 21 U.S.C.section 360bbb-3(b)(1), unless the authorization is terminated  or revoked sooner.       Influenza A by PCR NEGATIVE NEGATIVE   Influenza B by PCR NEGATIVE NEGATIVE    Comment: (NOTE) The Xpert Xpress SARS-CoV-2/FLU/RSV plus assay is intended as an aid in the diagnosis of influenza from Nasopharyngeal swab specimens and should not be used as a sole basis for treatment. Nasal washings and aspirates are unacceptable for Xpert Xpress SARS-CoV-2/FLU/RSV testing.  Fact Sheet for Patients: BloggerCourse.com  Fact Sheet for Healthcare Providers: SeriousBroker.it  This test is not yet approved or cleared by the  [place] [employer] and has been authorized for detection and/or diagnosis of SARS-CoV-2 by FDA under an TEFL teacher (EUA). This EUA will remain in effect (meaning this test can be used) for the duration of the COVID-19 declaration under Section 564(b)(1) of the Act, 21 U.S.C. section  360bbb-3(b)(1), unless the authorization is terminated or revoked.  Performed at [facility], [address] [place], [place] [address]     US Abdomen Limited RUQ (LIVER/GB)  Result Date: 08/13/2021 CLINICAL DATA:  Right upper quadrant pain, history of pancreatitis and pancreatic divisum, history of cholelithiasis EXAM: ULTRASOUND ABDOMEN LIMITED RIGHT UPPER QUADRANT COMPARISON:  05/16/2021 FINDINGS: Gallbladder: Multiple small shadowing gallstones layer dependently within the gallbladder. No evidence of gallbladder wall thickening or pericholecystic fluid. Negative sonographic Murphy sign. Common bile duct: Diameter: 8 mm Liver: No focal lesion identified. Within normal limits in parenchymal echogenicity. Portal vein is patent on color Doppler imaging with normal direction of blood flow towards the liver. Other: None. IMPRESSION: 1. Cholelithiasis without cholecystitis. Electronically Signed   By: Sharlet Salina M.D.   On: 08/13/2021 18:51    ROS - all of the below systems have been reviewed with the patient and positives are indicated with bold text General: chills, fever or night sweats Eyes: blurry vision or double vision ENT: epistaxis or sore throat Allergy/Immunology: itchy/watery eyes or nasal congestion Hematologic/Lymphatic: bleeding problems, blood clots or swollen lymph nodes Endocrine: temperature intolerance or unexpected weight changes Breast: new or changing breast lumps or nipple discharge Resp: cough, shortness of breath, or wheezing CV: chest pain or dyspnea on exertion GI: as per HPI GU: dysuria, trouble voiding, or hematuria MSK: joint pain or joint stiffness Neuro: TIA or stroke symptoms Derm: pruritus and skin lesion changes Psych: anxiety and depression  PE Blood pressure 112/76, pulse 91, temperature 99 F (37.2 C), temperature source Oral, resp. rate 17, last menstrual period 08/13/2021, SpO2 98 %, not currently  breastfeeding. Constitutional: NAD; conversant; no deformities Eyes: Moist conjunctiva; no lid lag; anicteric; PERRL Neck: Trachea midline; no thyromegaly Lungs: Normal respiratory effort; no tactile fremitus CV: RRR; no palpable thrills; no pitting edema GI: Abd TTP RUQ; no palpable hepatosplenomegaly MSK: Normal range of motion of extremities; no clubbing/cyanosis Psychiatric: Appropriate affect; alert and oriented x3 Lymphatic: No palpable cervical or axillary lymphadenopathy  Results for orders placed or performed during the hospital encounter of 08/13/21 (from the past 48 hour(s))  CBC with Differential     Status: Abnormal   Collection Time: 08/13/21  6:55 PM  Result Value Ref Range   WBC 11.7 (H) 4.0 - 10.5 K/uL   RBC 5.17 (H) 3.87 - 5.11 MIL/uL   Hemoglobin 14.4 12.0 - 15.0 g/dL   HCT 34.1 96.2 - 22.9 %   MCV 84.3 80.0 - 100.0 fL   MCH 27.9 26.0 - 34.0 pg   MCHC 33.0 30.0 - 36.0 g/dL   RDW 79.8 (H) 92.1 - 19.4 %   Platelets 323 150 - 400 K/uL   nRBC 0.0 0.0 - 0.2 %   Neutrophils Relative % 74 %   Neutro Abs 8.7 (H) 1.7 - 7.7 K/uL   Lymphocytes Relative 17 %   Lymphs Abs 2.0 0.7 - 4.0 K/uL   Monocytes Relative 6 %   Monocytes Absolute 0.7 0.1 - 1.0 K/uL   Eosinophils Relative 2 %   Eosinophils Absolute 0.2 0.0 - 0.5 K/uL   Basophils Relative 1 %   Basophils Absolute 0.1 0.0 - 0.1 K/uL  Immature Granulocytes 0 %   Abs Immature Granulocytes 0.03 0.00 - 0.07 K/uL    Comment: Performed at [facility], [address] [place], [place] [address]  Comprehensive metabolic panel     Status: Abnormal   Collection Time: 08/13/21  6:55 PM  Result Value Ref Range   Sodium 142 135 - 145 mmol/L   Potassium 3.6 3.5 - 5.1 mmol/L   Chloride 108 98 - 111 mmol/L   CO2 25 22 - 32 mmol/L   Glucose, Bld 129 (H) 70 - 99 mg/dL    Comment: Glucose reference range applies only to samples taken after fasting for at least 8 hours.   BUN 12 6 - 20 mg/dL   Creatinine, Ser  3.38 (H) 0.44 - 1.00 mg/dL   Calcium 9.3 8.9 - 25.0 mg/dL   Total Protein 7.9 6.5 - 8.1 g/dL   Albumin 4.1 3.5 - 5.0 g/dL   AST 61 (H) 15 - 41 U/L   ALT 37 0 - 44 U/L   Alkaline Phosphatase 182 (H) 38 - 126 U/L   Total Bilirubin 1.3 (H) 0.3 - 1.2 mg/dL   GFR, Estimated >53 >97 mL/min    Comment: (NOTE) Calculated using the CKD-EPI Creatinine Equation (2021)    Anion gap 9 5 - 15    Comment: Performed at [facility], [address], [place], [place] [address]  Lipase, blood     Status: None   Collection Time: 08/13/21  6:55 PM  Result Value Ref Range   Lipase 36 11 - 51 U/L    Comment: Performed at [facility], [address] [place], [place] [address]  Urinalysis, Routine w reflex microscopic     Status: Abnormal   Collection Time: 08/13/21  6:55 PM  Result Value Ref Range   Color, Urine AMBER (A) YELLOW    Comment: BIOCHEMICALS MAY BE AFFECTED BY COLOR   APPearance HAZY (A) CLEAR   Specific Gravity, Urine >=1.030 1.005 - 1.030   pH 6.0 5.0 - 8.0   Glucose, UA NEGATIVE NEGATIVE mg/dL   Hgb urine dipstick LARGE (A) NEGATIVE   Bilirubin Urine SMALL (A) NEGATIVE   Ketones, ur NEGATIVE NEGATIVE mg/dL   Protein, ur 240 (A) NEGATIVE mg/dL   Nitrite NEGATIVE NEGATIVE   Leukocytes,Ua TRACE (A) NEGATIVE   RBC / HPF >50 (H) 0 - 5 RBC/hpf   WBC, UA 11-20 0 - 5 WBC/hpf   Bacteria, UA FEW (A) NONE SEEN   Squamous Epithelial / LPF 0-5 0 - 5   Mucus PRESENT     Comment: Performed at [facility], [address], [place], [place] [address]  Resp Panel by RT-PCR (Flu A&B, Covid)     Status: None   Collection Time: 08/13/21  6:55 PM   Specimen: Nasopharyngeal(NP) swabs in vial transport medium  Result Value Ref Range   SARS Coronavirus 2 by RT PCR NEGATIVE NEGATIVE    Comment: (NOTE) SARS-CoV-2 target nucleic acids are NOT DETECTED.  The SARS-CoV-2 RNA is generally detectable in upper respiratory specimens during the acute  phase of infection. The lowest concentration of SARS-CoV-2 viral copies this assay can detect is 138 copies/mL. A negative result does not preclude SARS-Cov-2 infection and should not be used as the sole basis for treatment or other patient management decisions. A negative result may occur with  improper specimen collection/handling, submission of specimen other than nasopharyngeal swab, presence of viral mutation(s) within the areas targeted by  this assay, and inadequate number of viral copies(<138 copies/mL). A negative result must be combined with clinical observations, patient history, and epidemiological information. The expected result is Negative.  Fact Sheet for Patients:  BloggerCourse.com  Fact Sheet for Healthcare Providers:  SeriousBroker.it  This test is no t yet approved or cleared by the [place] FDA and  has been authorized for detection and/or diagnosis of SARS-CoV-2 by FDA under an Emergency Use Authorization (EUA). This EUA will remain  in effect (meaning this test can be used) for the duration of the COVID-19 declaration under Section 564(b)(1) of the Act, 21 U.S.C.section 360bbb-3(b)(1), unless the authorization is terminated  or revoked sooner.       Influenza A by PCR NEGATIVE NEGATIVE   Influenza B by PCR NEGATIVE NEGATIVE    Comment: (NOTE) The Xpert Xpress SARS-CoV-2/FLU/RSV plus assay is intended as an aid in the diagnosis of influenza from Nasopharyngeal swab specimens and should not be used as a sole basis for treatment. Nasal washings and aspirates are unacceptable for Xpert Xpress SARS-CoV-2/FLU/RSV testing.  Fact Sheet for Patients: BloggerCourse.com  Fact Sheet for Healthcare Providers: SeriousBroker.it  This test is not yet approved or cleared by the [place] FDA and has been authorized for detection and/or diagnosis of SARS-CoV-2  by FDA under an Emergency Use Authorization (EUA). This EUA will remain in effect (meaning this test can be used) for the duration of the COVID-19 declaration under Section 564(b)(1) of the Act, 21 U.S.C. section 360bbb-3(b)(1), unless the authorization is terminated or revoked.  Performed at [facility], [address], [place], [place] [address]     US Abdomen Limited RUQ (LIVER/GB)  Result Date: 08/13/2021 CLINICAL DATA:  Right upper quadrant pain, history of pancreatitis and pancreatic divisum, history of cholelithiasis EXAM: ULTRASOUND ABDOMEN LIMITED RIGHT UPPER QUADRANT COMPARISON:  05/16/2021 FINDINGS: Gallbladder: Multiple small shadowing gallstones layer dependently within the gallbladder. No evidence of gallbladder wall thickening or pericholecystic fluid. Negative sonographic Murphy sign. Common bile duct: Diameter: 8 mm Liver: No focal lesion identified. Within normal limits in parenchymal echogenicity. Portal vein is patent on color Doppler imaging with normal direction of blood flow towards the liver. Other: None. IMPRESSION: 1. Cholelithiasis without cholecystitis. Electronically Signed   By: Sharlet Salina M.D.   On: 08/13/2021 18:51     A/P: [name] is an 35 y.o. female with chronic cholecystitis and mildly elevated LFT's with intractable pain.  I have recommended admission to the floor for possible lap chole tomorrow.  Will keep NPO and IV pain and nausea control.   Vanita Panda, MD  Colorectal and General Surgery [facility]

## 2021-08-13 NOTE — ED Provider Notes (Signed)
[facility]-EMERGENCY DEPT Provider Note   CSN: [id number] Arrival date & time: 08/13/21  1722     History Chief Complaint  Patient presents with   Abdominal Pain    [name] is a 35 y.o. female.   Abdominal Pain Associated symptoms: nausea and vomiting   Associated symptoms: no chest pain and no shortness of breath   Patient presents with acute on chronic abdominal pain.  Seen earlier today at [facility] for the same.  Scheduled for outpatient removal of gallbladder in the next couple weeks.  Has had chronic pain for a while since her pancreatitis.  Has had a MRCP that showed pancreatic divisum and has had known small gallstones.  States pain got worse last night.  Pain uncontrolled now.  Reportedly called her surgeon who told to come in the ER for further gallbladder evaluation.    Past Medical History:  Diagnosis Date   Abdominal pain, chronic, epigastric    Abdominal pain, chronic, right lower quadrant    Anxiety    Bulging disc    Constipation    Contraceptive management 07/18/2014   Depression    Disk prolapse    Elevated liver enzymes 12/01/2013   Fatty liver 07/23/2017   Gastritis    GERD (gastroesophageal reflux disease)    Headache    Herpes    Mental disorder    anxiety   Obesity    Other and unspecified ovarian cyst 12/01/2013   Ovarian cyst, right    Splenomegaly 07/23/2017    Patient Active Problem List   Diagnosis Date Noted   H/O tubal ligation 12/09/2020   Chronic hypertension with superimposed preeclampsia 12/06/2020   Depression with anxiety 07/04/2020   Intractable chronic migraine without aura 02/19/2020   Long-term current use of opiate analgesic 02/19/2020   History of ovarian cyst 12/19/2019   Depression 05/31/2018   Fatty liver 07/23/2017   Splenomegaly 07/23/2017   Morbid obesity (HCC) 11/11/2014   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   Internal hemorrhoids with other complication 11/23/2013   Rectal bleeding  02/03/2013   Lumbar radiculopathy 07/14/2012   Esophageal reflux 01/11/2012   KNEE PAIN 06/13/2009    Past Surgical History:  Procedure Laterality Date   COLONOSCOPY WITH ESOPHAGOGASTRODUODENOSCOPY (EGD) N/A 02/24/2013   WIO:[id number] RECTAL BLEEDING DUE TO Moderate sized internal hemorrhoids, EGD: erosive esophagitis due to uncontrolled reflux, moderate erosive gastritis, benign path   ESOPHAGOGASTRODUODENOSCOPY  01/19/2012   STM:[id number] IN THE ANTRUM/HIATAL HERNIA/   ESOPHAGOGASTRODUODENOSCOPY N/A 07/31/2016   Procedure: ESOPHAGOGASTRODUODENOSCOPY (EGD);  Surgeon: West Bali, MD;  Location: [facility] ENDO SUITE;  Service: Endoscopy;  Laterality: N/A;  10:15 AM   TUBAL LIGATION Bilateral 12/08/2020   Procedure: POST PARTUM TUBAL LIGATION;  Surgeon: Reva Bores, MD;  Location: [facility] LD ORS;  Service: Gynecology;  Laterality: Bilateral;   WISDOM TOOTH EXTRACTION       OB History     Gravida  2   Para  2   Term  2   Preterm      AB      Living  2      SAB      IAB      Ectopic      Multiple  0   Live Births  2           Family History  Problem Relation Age of Onset   GER disease Mother    Other Mother        diverticulitis; hernia  Diabetes Mother        borderline   Anxiety disorder Mother    Depression Mother    Diabetes Maternal Grandmother    COPD Maternal Grandmother    Diabetes Maternal Grandfather    Congestive Heart Failure Maternal Grandfather    Headache Son    ADD / ADHD Son    Diabetes Other    Colon cancer Neg Hx    Colon polyps Neg Hx    Gastric cancer Neg Hx    Esophageal cancer Neg Hx     Social History   Tobacco Use   Smoking status: Former    Packs/day: 0.00    Years: 0.50    Pack years: 0.00    Types: Cigarettes    Quit date: 07/28/2011    Years since quitting: 10.0   Smokeless tobacco: Never  Vaping Use   Vaping Use: Former  Substance Use Topics   Alcohol use: No   Drug use: No    Home Medications Prior to  Admission medications   Medication Sig Start Date End Date Taking? Authorizing Provider  amLODipine (NORVASC) 5 MG tablet Take 1 tablet (5 mg total) by mouth daily. 01/09/21   Cresenzo-Dishmon, Scarlette Calico, CNM  DULoxetine (CYMBALTA) 60 MG capsule Take 60 mg by mouth at bedtime.    [provider]  ondansetron (ZOFRAN-ODT) 4 MG disintegrating tablet ondansetron 4 mg disintegrating tablet  DISSOLVE 1 TABLET IN MOUTH EVERY 6 HOURS AS NEEDED FOR NAUSEA    [provider]  oxyCODONE-acetaminophen (PERCOCET) 7.5-325 MG tablet Take 1 tablet by mouth every 8 (eight) hours as needed. 06/30/20   [provider]  pantoprazole (PROTONIX) 40 MG tablet TAKE 1 TABLET (40 MG TOTAL) BY MOUTH 2 (TWO) TIMES DAILY BEFORE A MEAL. 11/21/18   Anice Paganini, NP  pregabalin (LYRICA) 50 MG capsule Take 50 mg by mouth at bedtime. 11/05/20   [provider]  senna-docusate (SENOKOT-S) 8.6-50 MG tablet Senexon-S 8.6 mg-50 mg tablet  TAKE 2 TABLETS BY MOUTH AT BEDTIME AS NEEDED FOR MILD CONSTIPATION OR MODERATE CONSTIPATION.    [provider]  tiZANidine (ZANAFLEX) 2 MG tablet Take 2 mg by mouth at bedtime.    [provider]  traMADol (ULTRAM) 50 MG tablet Take 50-100 mg by mouth every 6 (six) hours as needed for moderate pain.    [provider]  valACYclovir (VALTREX) 1000 MG tablet Take 1,000 mg by mouth daily. 12/26/20   [provider]    Allergies    Tape and Naproxen  Review of Systems   Review of Systems  Constitutional:  Positive for appetite change.  HENT:  Negative for congestion.   Respiratory:  Negative for shortness of breath.   Cardiovascular:  Negative for chest pain.  Gastrointestinal:  Positive for abdominal pain, nausea and vomiting.  Musculoskeletal:  Positive for back pain.  Skin:  Negative for rash.  Neurological:  Negative for weakness.  Psychiatric/Behavioral:  Negative for confusion.    Physical Exam Updated Vital Signs BP  (!) 151/87 (BP Location: Left Arm)   Pulse 86   Temp 98.9 F (37.2 C) (Oral)   Resp 18   LMP 08/13/2021 (Approximate)   SpO2 99%   Physical Exam Vitals and nursing note reviewed.  Constitutional:      Appearance: She is obese.  HENT:     Head: Atraumatic.  Cardiovascular:     Rate and Rhythm: Regular rhythm.  Pulmonary:     Breath sounds: Normal breath sounds.  Abdominal:     Tenderness: There is abdominal tenderness.     Comments: Right upper quadrant tenderness.  No hernia palpated.  Skin:    General: Skin is warm.     Capillary Refill: Capillary refill takes less than 2 seconds.  Neurological:     Mental Status: She is alert and oriented to person, place, and time.    ED Results / Procedures / Treatments   Labs (all labs ordered are listed, but only abnormal results are displayed) Labs Reviewed  RESP PANEL BY RT-PCR (FLU A&B, COVID) ARPGX2  CBC WITH DIFFERENTIAL/PLATELET  COMPREHENSIVE METABOLIC PANEL  LIPASE, BLOOD  URINALYSIS, ROUTINE W REFLEX MICROSCOPIC    EKG None  Radiology No results found.  Procedures Procedures   Medications Ordered in ED Medications - No data to display  ED Course  I have reviewed the triage vital signs and the nursing notes.  Pertinent labs & imaging results that were available during my care of the patient were reviewed by me and considered in my medical decision making (see chart for details).    MDM Rules/Calculators/A&P                           Patient with acute on chronic abdominal pain.  Has been going since yesterday.  Seen at [facility].  Had plans for cholecystectomy as an outpatient already.  Lipase was elevated at [facility] but not here.  White count mildly elevated appears to be near baseline.  Pain still present despite current narcotics.  Ultrasound showed gallstones without cholecystitis.  Bilirubin is mildly elevated.  Discussed with Dr. Donell Beers and Dr. Maisie Fus however.  Will admit to general surgery for pain  control and possible cholecystectomy. Final Clinical Impression(s) / ED Diagnoses Final diagnoses:  RUQ abdominal pain    Rx / DC Orders ED Discharge Orders     None        Benjiman Core, MD 08/13/21 2055

## 2021-08-13 NOTE — ED Provider Notes (Signed)
Emergency Medicine Provider Triage Evaluation Note  [name] , a 35 y.o. female  was evaluated in triage.  Pt complains of ruq pain, hx gallstones, scheduled for lap chole.  Review of Systems  Positive: Abd pain, nv Negative: fever  Physical Exam  BP (!) 151/87 (BP Location: Left Arm)   Pulse 86   Temp 98.9 F (37.2 C) (Oral)   Resp 18   SpO2 99%  Gen:   Awake, no distress   Resp:  Normal effort  MSK:   Moves extremities without difficulty  Other:  Epigastric and ruq ttp  Medical Decision Making  Medically screening exam initiated at 5:53 PM.  Appropriate orders placed.  [name] was informed that the remainder of the evaluation will be completed by another provider, this initial triage assessment does not replace that evaluation, and the importance of remaining in the ED until their evaluation is complete.     Rayne Du 08/13/21 1753    Benjiman Core, MD 08/13/21 2329

## 2021-08-13 NOTE — ED Notes (Signed)
Pt ambulatory in ED lobby. 

## 2021-08-14 ENCOUNTER — Encounter (HOSPITAL_COMMUNITY)
Admission: EM | Disposition: A | Discharge status: Home / Self Care | Payer: Self-pay | Source: Home / Self Care | Attending: Emergency Medicine

## 2021-08-14 ENCOUNTER — Observation Stay (HOSPITAL_COMMUNITY): Payer: Medicaid Other | Admitting: Certified Registered Nurse Anesthetist

## 2021-08-14 ENCOUNTER — Encounter (HOSPITAL_COMMUNITY): Payer: Self-pay

## 2021-08-14 ENCOUNTER — Observation Stay (HOSPITAL_COMMUNITY): Payer: Medicaid Other

## 2021-08-14 DIAGNOSIS — Z87891 Personal history of nicotine dependence: Secondary | ICD-10-CM | POA: Diagnosis not present

## 2021-08-14 DIAGNOSIS — Z20822 Contact with and (suspected) exposure to covid-19: Secondary | ICD-10-CM | POA: Diagnosis not present

## 2021-08-14 DIAGNOSIS — K8010 Calculus of gallbladder with chronic cholecystitis without obstruction: Secondary | ICD-10-CM | POA: Diagnosis not present

## 2021-08-14 HISTORY — PX: CHOLECYSTECTOMY: SHX55

## 2021-08-14 LAB — SURGICAL PCR SCREEN
MRSA, PCR: NEGATIVE
Staphylococcus aureus: NEGATIVE

## 2021-08-14 LAB — PREGNANCY, URINE: Preg Test, Ur: NEGATIVE

## 2021-08-14 SURGERY — LAPAROSCOPIC CHOLECYSTECTOMY WITH INTRAOPERATIVE CHOLANGIOGRAM
Anesthesia: General | Site: Abdomen

## 2021-08-14 MED ORDER — SUGAMMADEX SODIUM 200 MG/2ML IV SOLN
INTRAVENOUS | Status: DC | PRN
Start: 2021-08-14 — End: 2021-08-14
  Administered 2021-08-14: 400 mg via INTRAVENOUS

## 2021-08-14 MED ORDER — PROPOFOL 10 MG/ML IV BOLUS
INTRAVENOUS | Status: DC | PRN
  Administered 2021-08-14: 200 mg via INTRAVENOUS

## 2021-08-14 MED ORDER — HYDROMORPHONE HCL 1 MG/ML IJ SOLN
INTRAMUSCULAR | Status: AC
  Filled 2021-08-14: qty 1

## 2021-08-14 MED ORDER — FERROUS SULFATE 325 (65 FE) MG PO TABS
325.0000 mg | ORAL_TABLET | Freq: Every day | ORAL | Status: DC
  Administered 2021-08-14 – 2021-08-15 (×2): 325 mg via ORAL
  Filled 2021-08-14 (×2): qty 1

## 2021-08-14 MED ORDER — ONDANSETRON HCL 4 MG/2ML IJ SOLN
INTRAMUSCULAR | Status: AC
  Filled 2021-08-14: qty 2

## 2021-08-14 MED ORDER — LIDOCAINE HCL (PF) 2 % IJ SOLN
INTRAMUSCULAR | Status: AC
  Filled 2021-08-14: qty 5

## 2021-08-14 MED ORDER — LABETALOL HCL 5 MG/ML IV SOLN
INTRAVENOUS | Status: AC
  Filled 2021-08-14: qty 4

## 2021-08-14 MED ORDER — RINGERS IRRIGATION IR SOLN
Status: DC | PRN
  Administered 2021-08-14: 1

## 2021-08-14 MED ORDER — FENTANYL CITRATE (PF) 100 MCG/2ML IJ SOLN
INTRAMUSCULAR | Status: AC
  Filled 2021-08-14: qty 2

## 2021-08-14 MED ORDER — ENOXAPARIN SODIUM 40 MG/0.4ML IJ SOSY: 40.0000 mg | PREFILLED_SYRINGE | Freq: Every day | INTRAMUSCULAR | Status: DC

## 2021-08-14 MED ORDER — KCL IN DEXTROSE-NACL 20-5-0.45 MEQ/L-%-% IV SOLN
INTRAVENOUS | Status: DC
  Filled 2021-08-14: qty 1000

## 2021-08-14 MED ORDER — ROCURONIUM BROMIDE 10 MG/ML (PF) SYRINGE
PREFILLED_SYRINGE | INTRAVENOUS | Status: AC
  Filled 2021-08-14: qty 10

## 2021-08-14 MED ORDER — CHLORHEXIDINE GLUCONATE 0.12 % MT SOLN
15.0000 mL | Freq: Once | OROMUCOSAL | Status: AC
  Administered 2021-08-14: 15 mL via OROMUCOSAL

## 2021-08-14 MED ORDER — OXYCODONE HCL 5 MG/5ML PO SOLN: 5.0000 mg | Freq: Once | ORAL | Status: DC | PRN

## 2021-08-14 MED ORDER — SODIUM CHLORIDE (PF) 0.9 % IJ SOLN
INTRAMUSCULAR | Status: DC | PRN
  Administered 2021-08-14: 20 mL

## 2021-08-14 MED ORDER — DEXAMETHASONE SODIUM PHOSPHATE 10 MG/ML IJ SOLN
INTRAMUSCULAR | Status: AC
  Filled 2021-08-14: qty 1

## 2021-08-14 MED ORDER — MIDAZOLAM HCL 5 MG/5ML IJ SOLN
INTRAMUSCULAR | Status: DC | PRN
  Administered 2021-08-14: 2 mg via INTRAVENOUS

## 2021-08-14 MED ORDER — HYDROMORPHONE HCL 1 MG/ML IJ SOLN
0.2500 mg | INTRAMUSCULAR | Status: DC | PRN
  Administered 2021-08-14 (×2): 0.5 mg via INTRAVENOUS

## 2021-08-14 MED ORDER — LIDOCAINE 2% (20 MG/ML) 5 ML SYRINGE
INTRAMUSCULAR | Status: DC | PRN
  Administered 2021-08-14: 60 mg via INTRAVENOUS

## 2021-08-14 MED ORDER — HYDROMORPHONE HCL 1 MG/ML IJ SOLN
INTRAMUSCULAR | Status: AC
  Administered 2021-08-14: 0.5 mg via INTRAVENOUS
  Filled 2021-08-14: qty 1

## 2021-08-14 MED ORDER — ROCURONIUM BROMIDE 10 MG/ML (PF) SYRINGE
PREFILLED_SYRINGE | INTRAVENOUS | Status: DC | PRN
  Administered 2021-08-14: 20 mg via INTRAVENOUS
  Administered 2021-08-14: 60 mg via INTRAVENOUS
  Administered 2021-08-14: 20 mg via INTRAVENOUS

## 2021-08-14 MED ORDER — DEXAMETHASONE SODIUM PHOSPHATE 4 MG/ML IJ SOLN
INTRAMUSCULAR | Status: DC | PRN
  Administered 2021-08-14: 10 mg via INTRAVENOUS

## 2021-08-14 MED ORDER — OXYCODONE HCL 5 MG PO TABS
5.0000 mg | ORAL_TABLET | ORAL | Status: DC | PRN
  Administered 2021-08-15: 5 mg via ORAL
  Filled 2021-08-14: qty 1

## 2021-08-14 MED ORDER — MIDAZOLAM HCL 2 MG/2ML IJ SOLN
INTRAMUSCULAR | Status: AC
  Filled 2021-08-14: qty 4

## 2021-08-14 MED ORDER — PROMETHAZINE HCL 25 MG/ML IJ SOLN: 6.2500 mg | INTRAMUSCULAR | Status: DC | PRN

## 2021-08-14 MED ORDER — 0.9 % SODIUM CHLORIDE (POUR BTL) OPTIME
TOPICAL | Status: DC | PRN
  Administered 2021-08-14: 1000 mL

## 2021-08-14 MED ORDER — FENTANYL CITRATE (PF) 250 MCG/5ML IJ SOLN
INTRAMUSCULAR | Status: AC
  Filled 2021-08-14: qty 5

## 2021-08-14 MED ORDER — LACTATED RINGERS IV SOLN: INTRAVENOUS | Status: DC

## 2021-08-14 MED ORDER — METOPROLOL SUCCINATE ER 25 MG PO TB24
25.0000 mg | ORAL_TABLET | Freq: Every day | ORAL | Status: DC
  Administered 2021-08-15: 25 mg via ORAL
  Filled 2021-08-14: qty 1

## 2021-08-14 MED ORDER — FENTANYL CITRATE (PF) 100 MCG/2ML IJ SOLN
INTRAMUSCULAR | Status: DC | PRN
  Administered 2021-08-14 (×2): 100 ug via INTRAVENOUS
  Administered 2021-08-14: 50 ug via INTRAVENOUS
  Administered 2021-08-14: 100 ug via INTRAVENOUS

## 2021-08-14 MED ORDER — PROPOFOL 10 MG/ML IV BOLUS
INTRAVENOUS | Status: AC
  Filled 2021-08-14: qty 20

## 2021-08-14 MED ORDER — HYDROXYZINE HCL 25 MG PO TABS
25.0000 mg | ORAL_TABLET | Freq: Every day | ORAL | Status: DC
  Administered 2021-08-15: 25 mg via ORAL
  Filled 2021-08-14 (×2): qty 1

## 2021-08-14 MED ORDER — OXYCODONE HCL 5 MG PO TABS: 5.0000 mg | ORAL_TABLET | Freq: Once | ORAL | Status: DC | PRN

## 2021-08-14 MED ORDER — MORPHINE SULFATE (PF) 2 MG/ML IV SOLN
1.0000 mg | INTRAVENOUS | Status: DC | PRN
  Administered 2021-08-14: 4 mg via INTRAVENOUS
  Administered 2021-08-14: 2 mg via INTRAVENOUS
  Administered 2021-08-14: 4 mg via INTRAVENOUS
  Administered 2021-08-15 (×2): 2 mg via INTRAVENOUS
  Filled 2021-08-14: qty 1
  Filled 2021-08-14: qty 2
  Filled 2021-08-14: qty 1
  Filled 2021-08-14: qty 2
  Filled 2021-08-14 (×2): qty 1

## 2021-08-14 MED ORDER — BUPIVACAINE-EPINEPHRINE 0.5% -1:200000 IJ SOLN
INTRAMUSCULAR | Status: DC | PRN
  Administered 2021-08-14: 30 mL

## 2021-08-14 MED ORDER — MEPERIDINE HCL 50 MG/ML IJ SOLN: 6.2500 mg | INTRAMUSCULAR | Status: DC | PRN

## 2021-08-14 SURGICAL SUPPLY — 41 items
ADH SKN CLS APL DERMABOND .7 (GAUZE/BANDAGES/DRESSINGS) ×1
APL PRP STRL LF DISP 70% ISPRP (MISCELLANEOUS) ×2
APPLIER CLIP ROT 10 11.4 M/L (STAPLE) ×2
APR CLP MED LRG 11.4X10 (STAPLE) ×1
BAG COUNTER SPONGE SURGICOUNT (BAG) IMPLANT
BAG SPEC RTRVL LRG 6X4 10 (ENDOMECHANICALS) ×1
BAG SPNG CNTER NS LX DISP (BAG)
CABLE HIGH FREQUENCY MONO STRZ (ELECTRODE) ×2 IMPLANT
CHLORAPREP W/TINT 26 (MISCELLANEOUS) ×4 IMPLANT
CLIP APPLIE ROT 10 11.4 M/L (STAPLE) ×1 IMPLANT
COVER MAYO STAND STRL (DRAPES) ×2 IMPLANT
COVER SURGICAL LIGHT HANDLE (MISCELLANEOUS) ×2 IMPLANT
DECANTER SPIKE VIAL GLASS SM (MISCELLANEOUS) ×2 IMPLANT
DERMABOND ADVANCED (GAUZE/BANDAGES/DRESSINGS) ×1
DERMABOND ADVANCED .7 DNX12 (GAUZE/BANDAGES/DRESSINGS) IMPLANT
DRAPE C-ARM 42X120 X-RAY (DRAPES) ×2 IMPLANT
ELECT REM PT RETURN 15FT ADLT (MISCELLANEOUS) ×2 IMPLANT
GAUZE SPONGE 2X2 8PLY STRL LF (GAUZE/BANDAGES/DRESSINGS) ×1 IMPLANT
GLOVE SURG SYN 7.5  E (GLOVE) ×4
GLOVE SURG SYN 7.5 E (GLOVE) ×2 IMPLANT
GLOVE SURG SYN 7.5 PF PI (GLOVE) ×2 IMPLANT
GOWN STRL REUS W/TWL XL LVL3 (GOWN DISPOSABLE) ×4 IMPLANT
HEMOSTAT SURGICEL 4X8 (HEMOSTASIS) IMPLANT
KIT BASIN OR (CUSTOM PROCEDURE TRAY) ×2 IMPLANT
KIT TURNOVER KIT A (KITS) ×2 IMPLANT
PENCIL SMOKE EVACUATOR (MISCELLANEOUS) IMPLANT
POUCH SPECIMEN RETRIEVAL 10MM (ENDOMECHANICALS) ×2 IMPLANT
SCISSORS LAP 5X35 DISP (ENDOMECHANICALS) ×2 IMPLANT
SET CHOLANGIOGRAPH MIX (MISCELLANEOUS) ×2 IMPLANT
SET IRRIG TUBING LAPAROSCOPIC (IRRIGATION / IRRIGATOR) ×2 IMPLANT
SET TUBE SMOKE EVAC HIGH FLOW (TUBING) IMPLANT
SLEEVE XCEL OPT CAN 5 100 (ENDOMECHANICALS) ×2 IMPLANT
SPONGE GAUZE 2X2 STER 10/PKG (GAUZE/BANDAGES/DRESSINGS) ×1
STRIP CLOSURE SKIN 1/2X4 (GAUZE/BANDAGES/DRESSINGS) IMPLANT
SUT MNCRL AB 4-0 PS2 18 (SUTURE) ×2 IMPLANT
TOWEL OR 17X26 10 PK STRL BLUE (TOWEL DISPOSABLE) ×2 IMPLANT
TOWEL OR NON WOVEN STRL DISP B (DISPOSABLE) ×2 IMPLANT
TRAY LAPAROSCOPIC (CUSTOM PROCEDURE TRAY) ×2 IMPLANT
TROCAR BLADELESS OPT 5 100 (ENDOMECHANICALS) ×2 IMPLANT
TROCAR XCEL BLUNT TIP 100MML (ENDOMECHANICALS) ×2 IMPLANT
TROCAR XCEL NON-BLD 11X100MML (ENDOMECHANICALS) ×2 IMPLANT

## 2021-08-14 NOTE — Progress Notes (Signed)
Progress Note  Day of Surgery  Subjective: CC: continue abdominal pain She continues to have abdominal pain but nausea and emesis have resolved. Pain well controlled with pain medications. No respiratory complaints. Last BM prior to admission   Objective: Vital signs in last 24 hours: Temp:  [97.3 F (36.3 C)-99 F (37.2 C)] 97.7 F (36.5 C) (09/29 0550) Pulse Rate:  [71-97] 71 (09/29 0556) Resp:  [16-20] 18 (09/29 0550) BP: (85-151)/(56-117) 125/56 (09/29 0556) SpO2:  [96 %-100 %] 97 % (09/29 0556) Weight:  [165.9 kg] 165.9 kg (09/28 2209) Last BM Date: 08/12/21 (per pt)  Intake/Output from previous day: 09/28 0701 - 09/29 0700 In: 373.3 [P.O.:120; I.V.:153.3; IV Piggyback:100] Out: -  Intake/Output this shift: No intake/output data recorded.  PE: General: pleasant, WD, female who is laying in bed in NAD HEENT: head is normocephalic, atraumatic.  Mouth is pink and moist Heart: regular, rate, and rhythm. Palpable radial pulses bilaterally Lungs: CTAB, no wheezes, rhonchi, or rales noted.  Respiratory effort nonlabored Abd: soft, +BS, distension difficult to assess's given body habitus. Mild to moderate TTP of RUQ without rebound or guarding MSK: all 4 extremities are symmetrical with no cyanosis, clubbing, or edema. Skin: warm and dry with no masses, lesions, or rashes Psych: A&Ox3 with an appropriate affect.    Lab Results:  Recent Labs    08/13/21 1855  WBC 11.7*  HGB 14.4  HCT 43.6  PLT 323   BMET Recent Labs    08/13/21 1855  NA 142  K 3.6  CL 108  CO2 25  GLUCOSE 129*  BUN 12  CREATININE 1.06*  CALCIUM 9.3   PT/INR No results for input(s): LABPROT, INR in the last 72 hours. CMP     Component Value Date/Time   NA 142 08/13/2021 1855   NA 137 12/05/2020 1231   K 3.6 08/13/2021 1855   CL 108 08/13/2021 1855   CO2 25 08/13/2021 1855   GLUCOSE 129 (H) 08/13/2021 1855   BUN 12 08/13/2021 1855   BUN 7 12/05/2020 1231   CREATININE 1.06 (H)  08/13/2021 1855   CREATININE 0.88 12/05/2013 1605   CALCIUM 9.3 08/13/2021 1855   PROT 7.9 08/13/2021 1855   PROT 6.2 12/05/2020 1231   ALBUMIN 4.1 08/13/2021 1855   ALBUMIN 3.5 (L) 12/05/2020 1231   AST 61 (H) 08/13/2021 1855   ALT 37 08/13/2021 1855   ALKPHOS 182 (H) 08/13/2021 1855   BILITOT 1.3 (H) 08/13/2021 1855   BILITOT 0.5 12/05/2020 1231   GFRNONAA >60 08/13/2021 1855   GFRAA 137 12/05/2020 1231   Lipase     Component Value Date/Time   LIPASE 36 08/13/2021 1855       Studies/Results: US Abdomen Limited RUQ (LIVER/GB)  Result Date: 08/13/2021 CLINICAL DATA:  Right upper quadrant pain, history of pancreatitis and pancreatic divisum, history of cholelithiasis EXAM: ULTRASOUND ABDOMEN LIMITED RIGHT UPPER QUADRANT COMPARISON:  05/16/2021 FINDINGS: Gallbladder: Multiple small shadowing gallstones layer dependently within the gallbladder. No evidence of gallbladder wall thickening or pericholecystic fluid. Negative sonographic Murphy sign. Common bile duct: Diameter: 8 mm Liver: No focal lesion identified. Within normal limits in parenchymal echogenicity. Portal vein is patent on color Doppler imaging with normal direction of blood flow towards the liver. Other: None. IMPRESSION: 1. Cholelithiasis without cholecystitis. Electronically Signed   By: Sharlet Salina M.D.   On: 08/13/2021 18:51    Anti-infectives: Anti-infectives (From admission, onward)    Start     Dose/Rate Route Frequency Ordered  Stop   08/14/21 1000  valACYclovir (VALTREX) tablet 1,000 mg        1,000 mg Oral Daily 08/13/21 2205     08/13/21 2300  cefTRIAXone (ROCEPHIN) 2 g in sodium chloride 0.9 % 100 mL IVPB        2 g 200 mL/hr over 30 Minutes Intravenous Every 24 hours 08/13/21 2205 08/20/21 2259        Assessment/Plan Chronic cholecystitis and mildly elevated LFT's with intractable pain - to OR for lap chole with IOC today with Dr. Gerrit Friends  FEN: NPO ID: [email or web address] rocephin VTE: [email or web address]    LOS: 0  days    Eric Form, [facility] Surgery 08/14/2021, 10:49 AM Please see Amion for pager number during day hours 7:00am-4:30pm

## 2021-08-14 NOTE — Anesthesia Preprocedure Evaluation (Signed)
Anesthesia Evaluation  Patient identified by MRN, date of birth, ID band Patient awake    Reviewed: Allergy & Precautions, NPO status , Patient's Chart, lab work & pertinent test results, reviewed documented beta blocker date and time   Airway Mallampati: II  TM Distance: >3 FB Neck ROM: Full    Dental  (+) Missing, Dental Advisory Given   Pulmonary former smoker,    Pulmonary exam normal breath sounds clear to auscultation       Cardiovascular hypertension, Pt. on home beta blockers and Pt. on medications Normal cardiovascular exam Rhythm:Regular Rate:Normal     Neuro/Psych  Headaches, PSYCHIATRIC DISORDERS Anxiety Depression    GI/Hepatic Neg liver ROS, GERD  Controlled and Medicated,  Endo/Other  Morbid obesity  Renal/GU negative Renal ROS     Musculoskeletal negative musculoskeletal ROS (+)   Abdominal (+) + obese,   Peds  Hematology  (+) anemia ,   Anesthesia Other Findings   Reproductive/Obstetrics                             Anesthesia Physical  Anesthesia Plan  ASA: 4  Anesthesia Plan: General   Post-op Pain Management:    Induction: Intravenous  PONV Risk Score and Plan: 2 and Ondansetron, Dexamethasone and Treatment may vary due to age or medical condition  Airway Management Planned: Oral ETT  Additional Equipment:   Intra-op Plan:   Post-operative Plan: Extubation in OR  Informed Consent: I have reviewed the patients History and Physical, chart, labs and discussed the procedure including the risks, benefits and alternatives for the proposed anesthesia with the patient or authorized representative who has indicated his/her understanding and acceptance.     Dental advisory given  Plan Discussed with: CRNA  Anesthesia Plan Comments:         Anesthesia Quick Evaluation

## 2021-08-14 NOTE — Discharge Instructions (Signed)
[facility] ° °Please arrive at least 30 min before your appointment to complete your check in paperwork.  If you are unable to arrive 30 min prior to your appointment time we may have to cancel or reschedule you. °LAPAROSCOPIC SURGERY: POST OP INSTRUCTIONS °Always review your discharge instruction sheet given to you by the facility where your surgery was performed. °IF YOU HAVE DISABILITY OR FAMILY LEAVE FORMS, YOU MUST BRING THEM TO THE OFFICE FOR PROCESSING.   °DO NOT GIVE THEM TO YOUR DOCTOR. ° °PAIN CONTROL ° °First take acetaminophen (Tylenol) AND/or ibuprofen (Advil) to control your pain after surgery.  Follow directions on package.  Taking acetaminophen (Tylenol) and/or ibuprofen (Advil) regularly after surgery will help to control your pain and lower the amount of prescription pain medication you may need.  You should not take more than 4,000 mg (4 grams) of acetaminophen (Tylenol) in 24 hours.  You should not take ibuprofen (Advil), aleve, motrin, naprosyn or other NSAIDS if you have a history of stomach ulcers or chronic kidney disease.  °A prescription for pain medication may be given to you upon discharge.  Take your pain medication as prescribed, if you still have uncontrolled pain after taking acetaminophen (Tylenol) or ibuprofen (Advil). °Use ice packs to help control pain. °If you need a refill on your pain medication, please contact your pharmacy.  They will contact our office to request authorization. Prescriptions will not be filled after 5pm or on week-ends. ° °HOME MEDICATIONS °Take your usually prescribed medications unless otherwise directed. ° °DIET °You should follow a light diet the first few days after arrival home.  Be sure to include lots of fluids daily. Avoid fatty, fried foods.  ° °CONSTIPATION °It is common to experience some constipation after surgery and if you are taking pain medication.  Increasing fluid intake and taking a stool softener (such as Colace)  will usually help or prevent this problem from occurring.  A mild laxative (Milk of Magnesia or Miralax) should be taken according to package instructions if there are no bowel movements after 48 hours. ° °WOUND/INCISION CARE °Most patients will experience some swelling and bruising in the area of the incisions.  Ice packs will help.  Swelling and bruising can take several days to resolve.  °Unless discharge instructions indicate otherwise, follow guidelines below  °STERI-STRIPS - you may remove your outer bandages 48 hours after surgery, and you may shower at that time.  You have steri-strips (small skin tapes) in place directly over the incision.  These strips should be left on the skin for 7-10 days.   °DERMABOND/SKIN GLUE - you may shower in 24 hours.  The glue will flake off over the next 2-3 weeks. °Any sutures or staples will be removed at the office during your follow-up visit. ° °ACTIVITIES °You may resume regular (light) daily activities beginning the next day--such as daily self-care, walking, climbing stairs--gradually increasing activities as tolerated.  You may have sexual intercourse when it is comfortable.  Refrain from any heavy lifting or straining until approved by your doctor. °You may drive when you are no longer taking prescription pain medication, you can comfortably wear a seatbelt, and you can safely maneuver your car and apply brakes. ° °FOLLOW-UP °You should see your doctor in the office for a follow-up appointment approximately 2-3 weeks after your surgery.  You should have been given your post-op/follow-up appointment when your surgery was scheduled.  If you did not receive a post-op/follow-up appointment, make sure   that you call for this appointment within a day or two after you arrive home to insure a convenient appointment time. ° ° °WHEN TO CALL YOUR DOCTOR: °Fever over 101.0 °Inability to urinate °Continued bleeding from incision. °Increased pain, redness, or drainage from the  incision. °Increasing abdominal pain ° °The clinic staff is available to answer your questions during regular business hours.  Please don’t hesitate to call and ask to speak to one of the nurses for clinical concerns.  If you have a medical emergency, go to the nearest emergency room or call 911.  A surgeon from [facility] is always on call at the hospital. °[address], [address], [place], [place]  [address] ? [address] [address], [place], [place]   [address] °[phone] ? [phone] ? FAX [phone] ° ° ° ° °Managing Your Pain After Surgery Without Opioids ° ° ° °Thank you for participating in our program to help patients manage their pain after surgery without opioids. This is part of our effort to provide you with the best care possible, without exposing you or your family to the risk that opioids pose. ° °What pain can I expect after surgery? °You can expect to have some pain after surgery. This is normal. The pain is typically worse the day after surgery, and quickly begins to get better. °Many studies have found that many patients are able to manage their pain after surgery with Over-the-Counter (OTC) medications such as Tylenol and Motrin. If you have a condition that does not allow you to take Tylenol or Motrin, notify your surgical team. ° °How will I manage my pain? °The best strategy for controlling your pain after surgery is around the clock pain control with Tylenol (acetaminophen) and Motrin (ibuprofen or Advil). Alternating these medications with each other allows you to maximize your pain control. In addition to Tylenol and Motrin, you can use heating pads or ice packs on your incisions to help reduce your pain. ° °How will I alternate your regular strength over-the-counter pain medication? °You will take a dose of pain medication every three hours. °Start by taking 650 mg of Tylenol (2 pills of 325 mg) °3 hours later take 600 mg of Motrin (3 pills of 200 mg) °3 hours after  taking the Motrin take 650 mg of Tylenol °3 hours after that take 600 mg of Motrin. ° ° °- 1 - ° °See example - if your first dose of Tylenol is at 12:00 PM ° ° °12:00 PM Tylenol 650 mg (2 pills of 325 mg)  °3:00 PM Motrin 600 mg (3 pills of 200 mg)  °6:00 PM Tylenol 650 mg (2 pills of 325 mg)  °9:00 PM Motrin 600 mg (3 pills of 200 mg)  °Continue alternating every 3 hours  ° °We recommend that you follow this schedule around-the-clock for at least 3 days after surgery, or until you feel that it is no longer needed. Use the table on the last page of this handout to keep track of the medications you are taking. °Important: °Do not take more than 3000mg of Tylenol or 3200mg of Motrin in a 24-hour period. °Do not take ibuprofen/Motrin if you have a history of bleeding stomach ulcers, severe kidney disease, &/or actively taking a blood thinner ° °What if I still have pain? °If you have pain that is not controlled with the over-the-counter pain medications (Tylenol and Motrin or Advil) you might have what we call “breakthrough” pain. You will receive a prescription   for a small amount of an opioid pain medication such as Oxycodone, Tramadol, or Tylenol with Codeine. Use these opioid pills in the first 24 hours after surgery if you have breakthrough pain. Do not take more than 1 pill every 4-6 hours. ° °If you still have uncontrolled pain after using all opioid pills, don't hesitate to call our staff using the number provided. We will help make sure you are managing your pain in the best way possible, and if necessary, we can provide a prescription for additional pain medication. ° ° °Day 1   ° °Time  °Name of Medication Number of pills taken  °Amount of Acetaminophen  °Pain Level  ° °Comments  °AM PM       °AM PM       °AM PM       °AM PM       °AM PM       °AM PM       °AM PM       °AM PM       °Total Daily amount of Acetaminophen °Do not take more than  3,000 mg per day    ° ° °Day 2   ° °Time  °Name of Medication  Number of pills °taken  °Amount of Acetaminophen  °Pain Level  ° °Comments  °AM PM       °AM PM       °AM PM       °AM PM       °AM PM       °AM PM       °AM PM       °AM PM       °Total Daily amount of Acetaminophen °Do not take more than  3,000 mg per day    ° ° °Day 3   ° °Time  °Name of Medication Number of pills taken  °Amount of Acetaminophen  °Pain Level  ° °Comments  °AM PM       °AM PM       °AM PM       °AM PM       ° ° ° °AM PM       °AM PM       °AM PM       °AM PM       °Total Daily amount of Acetaminophen °Do not take more than  3,000 mg per day    ° ° °Day 4   ° °Time  °Name of Medication Number of pills taken  °Amount of Acetaminophen  °Pain Level  ° °Comments  °AM PM       °AM PM       °AM PM       °AM PM       °AM PM       °AM PM       °AM PM       °AM PM       °Total Daily amount of Acetaminophen °Do not take more than  3,000 mg per day    ° ° °Day 5   ° °Time  °Name of Medication Number °of pills taken  °Amount of Acetaminophen  °Pain Level  ° °Comments  °AM PM       °AM PM       °AM PM       °AM PM       °AM PM       °AM   PM       °AM PM       °AM PM       °Total Daily amount of Acetaminophen °Do not take more than  3,000 mg per day    ° ° ° °Day 6   ° °Time  °Name of Medication Number of pills °taken  °Amount of Acetaminophen  °Pain Level  °Comments  °AM PM       °AM PM       °AM PM       °AM PM       °AM PM       °AM PM       °AM PM       °AM PM       °Total Daily amount of Acetaminophen °Do not take more than  3,000 mg per day    ° ° °Day 7   ° °Time  °Name of Medication Number of pills taken  °Amount of Acetaminophen  °Pain Level  ° °Comments  °AM PM       °AM PM       °AM PM       °AM PM       °AM PM       °AM PM       °AM PM       °AM PM       °Total Daily amount of Acetaminophen °Do not take more than  3,000 mg per day    ° ° ° ° °For additional information about how and where to safely dispose of unused opioid °medications - https://www.morepowerfulnc.org ° °Disclaimer: This document  contains information and/or instructional materials adapted from [facility] for the typical patient with your condition. It does not replace medical advice from your health care provider because your experience may differ from that of the °typical patient. Talk to your health care provider if you have any questions about this °document, your condition or your treatment plan. °Adapted from [facility] ° °

## 2021-08-14 NOTE — H&P (View-Only) (Signed)
Patient ID: [name], female   DOB: 1986/10/23, 35 y.o.   MRN: [id number]      Patient seen and examined.  Mother at bedside.  Chronic cholecystitis with planned lap chole this month by Dr. Donell Beers.  Now with persistent pain.  Plan lap chole with IOC later today.  Previous BTL.  Mild RUQ tenderness, no mass.  The risks and benefits of the procedure have been discussed at length with the patient.  The patient understands the proposed procedure, potential alternative treatments, and the course of recovery to be expected.  All of the patient's questions have been answered at this time.  The patient wishes to proceed with surgery.  Darnell Level, MD [facility] practice Office: [phone]

## 2021-08-14 NOTE — Progress Notes (Signed)
Patient ID: [name], female   DOB: 02/09/1986, 34 y.o.   MRN: [id number]      Patient seen and examined.  Mother at bedside.  Chronic cholecystitis with planned lap chole this month by Dr. Byerly.  Now with persistent pain.  Plan lap chole with IOC later today.  Previous BTL.  Mild RUQ tenderness, no mass.  The risks and benefits of the procedure have been discussed at length with the patient.  The patient understands the proposed procedure, potential alternative treatments, and the course of recovery to be expected.  All of the patient's questions have been answered at this time.  The patient wishes to proceed with surgery.  Audon Heymann, MD [facility] practice Office: [phone]  

## 2021-08-14 NOTE — Interval H&P Note (Signed)
History and Physical Interval Note:  08/14/2021 11:48 AM  [name]  has presented today for surgery, with the diagnosis of gallbladder sludge.  The various methods of treatment have been discussed with the patient and family. After consideration of risks, benefits and other options for treatment, the patient has consented to    Procedure(s): LAPAROSCOPIC CHOLECYSTECTOMY WITH INTRAOPERATIVE CHOLANGIOGRAM (N/A) as a surgical intervention.    The patient's history has been reviewed, patient examined, no change in status, stable for surgery.  I have reviewed the patient's chart and labs.  Questions were answered to the patient's satisfaction.    Darnell Level, MD [facility] practice Office: [phone]   Darnell Level

## 2021-08-14 NOTE — Op Note (Signed)
Procedure Note  Pre-operative Diagnosis:  chronic cholecystitis, cholelithiasis, pancreatitis, unrelenting abdominal pain  Post-operative Diagnosis:  same  Surgeon:  Darnell Level, MD  Assistant:  Barnetta Chapel, PA-C   Procedure:  Laparoscopic cholecystectomy with intra-operative cholangiography  Anesthesia:  General  Estimated Blood Loss:  minimal  Drains: none         Specimen: gallbladder to pathology  Indications:  [name] is an 35 y.o. female who is here for RUQ pain.  She is currently scheduled for lap chole with Dr Donell Beers on Nov 4, but came to the ED in [place] last night with nausea, vomiting and RUQ pain.  Lipase was mildly elevated but remainder of workup was neg for cholecystitis.  She had continued pain and was directed to the ED here by our office earlier today.   Procedure description: The patient was seen in the pre-op holding area. The risks, benefits, complications, treatment options, and expected outcomes were previously discussed with the patient. The patient agreed with the proposed plan and has signed the informed consent form.  The patient was transported to operating room #4 at the [facility]. The patient was placed in the supine position on the operating room table. Following induction of general anesthesia, the abdomen was prepped and draped in the usual aseptic fashion.  An incision was made in the skin near the umbilicus. The midline fascia was incised and the peritoneal cavity was entered and a Hasson cannula was introduced under direct vision. The cannula was secured with a 0-Vicryl pursestring suture. Pneumoperitoneum was established with carbon dioxide. Additional cannulae were introduced under direct vision along the right costal margin in the midline, mid-clavicular line, and anterior axillary line.   The gallbladder was identified and the fundus grasped and retracted cephalad. Adhesions were taken down bluntly and the electrocautery was  utilized as needed, taking care not to involve any adjacent structures. The infundibulum was grasped and retracted laterally, exposing the peritoneum overlying the triangle of Calot. The peritoneum was incised and structures exposed with blunt dissection. The cystic duct was clearly identified, bluntly dissected circumferentially, and clipped at the neck of the gallbladder.  An incision was made in the cystic duct and the cholangiogram catheter introduced. The catheter was secured using an ligaclip.  Real-time cholangiography was performed using C-arm fluoroscopy.  There was rapid filling of a normal caliber common bile duct.  There was reflux of contrast into the left and right hepatic ductal systems.  There was free flow distally into the duodenum without filling defect or obstruction.  The catheter was removed from the peritoneal cavity.  The cystic duct was then ligated with ligaclips and divided. The cystic artery was identified, dissected circumferentially, ligated with ligaclips, and divided.  The gallbladder was dissected away from the gallbladder bed using the electrocautery for hemostasis. The gallbladder was completely removed from the liver and placed into an endocatch bag. The gallbladder was removed in the endocatch bag through the umbilical port site and submitted to pathology for review.  The right upper quadrant was irrigated and the gallbladder bed was inspected. Hemostasis was achieved with the electrocautery.  Cannulae were removed under direct vision and good hemostasis was noted. Pneumoperitoneum was released and the majority of the carbon dioxide evacuated. The umbilical wound was irrigated and the fascia was then closed with the pursestring suture.  Local anesthetic was infiltrated at all port sites. Skin incisions were closed with 4-0 Monocril subcuticular sutures and Dermabond was applied.  Instrument, sponge, and  needle counts were correct at the conclusion of the case.  The  patient was awakened from anesthesia and brought to the recovery room in stable condition.  The patient tolerated the procedure well.   Darnell Level, MD [facility] Office: [phone]

## 2021-08-14 NOTE — Transfer of Care (Signed)
Immediate Anesthesia Transfer of Care Note  Patient: [name]  Procedure(s) Performed: LAPAROSCOPIC CHOLECYSTECTOMY WITH INTRAOPERATIVE CHOLANGIOGRAM (Abdomen)  Patient Location: PACU  Anesthesia Type:General  Level of Consciousness: awake, alert , oriented and patient cooperative  Airway & Oxygen Therapy: Patient Spontanous Breathing and Patient connected to face mask  Post-op Assessment: Report given to RN and Post -op Vital signs reviewed and stable  Post vital signs: Reviewed and stable  Last Vitals:  Vitals Value Taken Time  BP 119/67 08/14/21 1348  Temp    Pulse 86 08/14/21 1351  Resp    SpO2 100 % 08/14/21 1351  Vitals shown include unvalidated device data.  Last Pain:  Vitals:   08/14/21 1112  TempSrc: Oral  PainSc: 7       Patients Stated Pain Goal: 6 (08/14/21 1112)  Complications: No notable events documented.

## 2021-08-14 NOTE — Anesthesia Procedure Notes (Signed)
Procedure Name: Intubation Date/Time: 08/14/2021 12:21 PM Performed by: Vanessa [place], CRNA Pre-anesthesia Checklist: Patient identified, Emergency Drugs available, Suction available and Patient being monitored Patient Re-evaluated:Patient Re-evaluated prior to induction Oxygen Delivery Method: Circle system utilized Preoxygenation: Pre-oxygenation with 100% oxygen Induction Type: IV induction Ventilation: Mask ventilation without difficulty Laryngoscope Size: 2 and Miller Grade View: Grade I Tube type: Oral Tube size: 7.5 mm Number of attempts: 1 Airway Equipment and Method: Stylet Placement Confirmation: ETT inserted through vocal cords under direct vision, positive ETCO2 and breath sounds checked- equal and bilateral Secured at: 22 cm Tube secured with: Tape Dental Injury: Teeth and Oropharynx as per pre-operative assessment

## 2021-08-14 NOTE — Progress Notes (Signed)
Spoke to floor nurse, received report for surgery. Informed nurse a urine pregnancy is needed for surgery, order placed in epic. Nurse voiced understanding.

## 2021-08-15 ENCOUNTER — Encounter (HOSPITAL_COMMUNITY): Payer: Self-pay | Admitting: Surgery

## 2021-08-15 DIAGNOSIS — Z9049 Acquired absence of other specified parts of digestive tract: Secondary | ICD-10-CM

## 2021-08-15 LAB — SURGICAL PATHOLOGY

## 2021-08-15 MED ORDER — ACETAMINOPHEN 500 MG PO TABS: 1000.0000 mg | ORAL_TABLET | Freq: Four times a day (QID) | ORAL | Status: AC

## 2021-08-15 MED ORDER — DOCUSATE SODIUM 100 MG PO CAPS: 100.0000 mg | ORAL_CAPSULE | Freq: Two times a day (BID) | ORAL | Status: AC | PRN

## 2021-08-15 NOTE — Discharge Summary (Signed)
[facility] Discharge Summary   Patient ID: [name] MRN: [id number] DOB/AGE: 1986/10/22 34 y.o.  Admit date: 08/13/2021 Discharge date: 08/15/2021  Admitting Diagnosis: Chronic cholecystitis [K81.1] RUQ abdominal pain [R10.11] Chronic cholecystitis with calculus [K80.10]   Discharge Diagnosis Patient Active Problem List   Diagnosis Date Noted   History of laparoscopic cholecystectomy 08/15/2021     Consultants None  Imaging: DG Cholangiogram Operative  Result Date: 08/14/2021 CLINICAL DATA:  Chronic cholecystitis Cholelithiasis EXAM: INTRAOPERATIVE CHOLANGIOGRAM TECHNIQUE: Cholangiographic images from the C-arm fluoroscopic device were submitted for interpretation post-operatively. Please see the procedural report for the amount of contrast and the fluoroscopy time utilized. COMPARISON:  Ultrasound abdomen 08/13/2021 FINDINGS: One intraoperative fluoroscopic image 1 cine were provided for interpretation. Fluoro time: 15 seconds Submitted images demonstrate opacification of the intra and extrahepatic bile ducts through cystic duct remnant. No filling defects identified within the common bile duct. There is free flow of contrast into the duodenum. IMPRESSION: Intraoperative cholangiogram as above. Electronically Signed   By: Acquanetta Belling M.D.   On: 08/14/2021 14:26   US Abdomen Limited RUQ (LIVER/GB)  Result Date: 08/13/2021 CLINICAL DATA:  Right upper quadrant pain, history of pancreatitis and pancreatic divisum, history of cholelithiasis EXAM: ULTRASOUND ABDOMEN LIMITED RIGHT UPPER QUADRANT COMPARISON:  05/16/2021 FINDINGS: Gallbladder: Multiple small shadowing gallstones layer dependently within the gallbladder. No evidence of gallbladder wall thickening or pericholecystic fluid. Negative sonographic Murphy sign. Common bile duct: Diameter: 8 mm Liver: No focal lesion identified. Within normal limits in parenchymal echogenicity. Portal vein is patent on color  Doppler imaging with normal direction of blood flow towards the liver. Other: None. IMPRESSION: 1. Cholelithiasis without cholecystitis. Electronically Signed   By: Sharlet Salina M.D.   On: 08/13/2021 18:51    Procedures Dr. Gerrit Friends (08/14/21) - Laparoscopic Cholecystectomy with [facility]  Hospital Course:  35 year old who presented to [facility] ED with right upper quadrant pain in setting of known chronic cholecystitis with prior plans for outpatient surgery.  Workup showed chronic cholecystitis and elevated LFTs.  Patient was admitted and underwent procedure listed above.  Tolerated procedure well and was transferred to the floor.  Diet was advanced as tolerated.  On POD1, the patient was voiding well, tolerating diet, ambulating well, pain well controlled, vital signs stable, incisions c/d/i and felt stable for discharge home.  Patient will follow up in our office in 2 weeks and knows to call with questions or concerns.  She will call to confirm appointment date/time.  She had pain medications at home and did not need further medication. She was instructed to use over the counter pain medications as well for optimal pain control  Physical Exam: General:  Alert, NAD, pleasant, comfortable Abd:  Soft, ND, mild tenderness, incisions C/D/I MSK: bilateral lower extremities without edema or TTP Skin: warm and dry Psych: A&O x3 with appropriate affect   Allergies as of 08/15/2021       Reactions   Tape Itching   Tapentadol Itching   Naproxen Other (See Comments)   Was told by doctor not to take medication due to it messing up her esophagus .        Medication List     TAKE these medications    acetaminophen 500 MG tablet Commonly known as: TYLENOL Take 2 tablets (1,000 mg total) by mouth every 6 (six) hours for 5 days.   amLODipine 5 MG tablet Commonly known as: NORVASC Take 1 tablet (5 mg total) by mouth daily.  docusate sodium 100 MG capsule Commonly known as: COLACE Take 1 capsule (100 mg  total) by mouth 2 (two) times daily as needed for up to 5 days for mild constipation.   DULoxetine 30 MG capsule Commonly known as: CYMBALTA Take 30 mg by mouth daily.   Emgality 120 MG/ML Soaj Generic drug: Galcanezumab-gnlm Inject 120 mg into the skin every 30 (thirty) days.   ferrous sulfate 324 MG Tbec Take 324 mg by mouth daily.   hydrOXYzine 25 MG tablet Commonly known as: ATARAX/VISTARIL Take 25 mg by mouth daily.   metoprolol succinate 25 MG 24 hr tablet Commonly known as: TOPROL-XL Take 25 mg by mouth daily.   ondansetron 4 MG disintegrating tablet Commonly known as: ZOFRAN-ODT Take 4 mg by mouth every 8 (eight) hours as needed for nausea or vomiting.   oxyCODONE-acetaminophen 7.5-325 MG tablet Commonly known as: PERCOCET Take 1 tablet by mouth every 8 (eight) hours as needed for severe pain.   pantoprazole 40 MG tablet Commonly known as: PROTONIX TAKE 1 TABLET (40 MG TOTAL) BY MOUTH 2 (TWO) TIMES DAILY BEFORE A MEAL. What changed:  how much to take when to take this   pregabalin 50 MG capsule Commonly known as: LYRICA Take 50 mg by mouth daily.   tiZANidine 2 MG tablet Commonly known as: ZANAFLEX Take 2 mg by mouth at bedtime.   traMADol 50 MG tablet Commonly known as: ULTRAM Take 50-100 mg by mouth every 6 (six) hours as needed for moderate pain.   Trokendi XR 200 MG Cp24 Generic drug: Topiramate ER Take 200 mg by mouth daily.   valACYclovir 1000 MG tablet Commonly known as: VALTREX Take 1,000 mg by mouth daily.   venlafaxine 75 MG tablet Commonly known as: EFFEXOR Take 75 mg by mouth 2 (two) times daily with a meal.   vitamin C 1000 MG tablet Take 1,000 mg by mouth daily.          Follow-up Information     [facility] Follow up on 09/04/2021.   Specialty: General Surgery Why: follow up on 09/04/21 at 2:30 pm. Please arrive 30 minutes early to complete check in process and bring photo ID Contact information: [address]  [address] [place] [address] [phone]                 Signed: Eric Form , [facility] 08/15/2021, 2:31 PM Please see Amion for pager number during day hours 7:00am-4:30pm

## 2021-08-15 NOTE — Anesthesia Postprocedure Evaluation (Signed)
Anesthesia Post Note  Patient: [name]  Procedure(s) Performed: LAPAROSCOPIC CHOLECYSTECTOMY WITH INTRAOPERATIVE CHOLANGIOGRAM (Abdomen)     Patient location during evaluation: PACU Anesthesia Type: General Level of consciousness: sedated and patient cooperative Pain management: pain level controlled Vital Signs Assessment: post-procedure vital signs reviewed and stable Respiratory status: spontaneous breathing Cardiovascular status: stable Anesthetic complications: no   No notable events documented.  Last Vitals:  Vitals:   08/15/21 0112 08/15/21 0551  BP: 100/69 111/68  Pulse: 82 74  Resp: 18 18  Temp: 36.7 C 36.8 C  SpO2: 95% 99%    Last Pain:  Vitals:   08/15/21 0959  TempSrc:   PainSc: 5                  Lewie Loron

## 2021-09-08 ENCOUNTER — Other Ambulatory Visit: Payer: Self-pay | Admitting: Adult Health

## 2021-09-10 ENCOUNTER — Ambulatory Visit: Admit: 2021-09-10 | Payer: Medicaid Other | Admitting: General Surgery

## 2021-09-10 SURGERY — LAPAROSCOPIC CHOLECYSTECTOMY
Anesthesia: General

## 2022-01-07 ENCOUNTER — Other Ambulatory Visit: Payer: Self-pay

## 2022-01-07 ENCOUNTER — Other Ambulatory Visit (HOSPITAL_COMMUNITY)
Admission: RE | Admit: 2022-01-07 | Discharge: 2022-01-07 | Disposition: A | Discharge status: Home / Self Care | Payer: Medicaid Other | Source: Ambulatory Visit | Attending: Adult Health | Admitting: Adult Health

## 2022-01-07 ENCOUNTER — Encounter: Payer: Self-pay | Admitting: Adult Health

## 2022-01-07 ENCOUNTER — Ambulatory Visit: Payer: Medicaid Other | Admitting: Adult Health

## 2022-01-07 VITALS — BP 126/90 | HR 96 | Ht 66.0 in | Wt 384.4 lb

## 2022-01-07 DIAGNOSIS — N9489 Other specified conditions associated with female genital organs and menstrual cycle: Secondary | ICD-10-CM

## 2022-01-07 DIAGNOSIS — R10813 Right lower quadrant abdominal tenderness: Secondary | ICD-10-CM

## 2022-01-07 DIAGNOSIS — Z124 Encounter for screening for malignant neoplasm of cervix: Secondary | ICD-10-CM | POA: Diagnosis present

## 2022-01-07 DIAGNOSIS — Z3202 Encounter for pregnancy test, result negative: Secondary | ICD-10-CM

## 2022-01-07 DIAGNOSIS — N939 Abnormal uterine and vaginal bleeding, unspecified: Secondary | ICD-10-CM | POA: Insufficient documentation

## 2022-01-07 DIAGNOSIS — Z9851 Tubal ligation status: Secondary | ICD-10-CM

## 2022-01-07 LAB — POCT URINE PREGNANCY: Preg Test, Ur: NEGATIVE

## 2022-01-07 MED ORDER — MEGESTROL ACETATE 40 MG PO TABS: ORAL_TABLET | ORAL | 2 refills | Status: DC

## 2022-01-07 NOTE — Progress Notes (Signed)
Patient ID: [name], female   DOB: May 03, 1986, 36 y.o.   MRN: [id number] History of Present Illness: [name] is a 36 year old white female, married, G2P2 in complaining of bleeding since 12/17/21 with clots and cramps and it comes and goes. She has had tubal ligation. She needs a pap PCP is Dr Felecia Shelling and she saw him this am and is having labs drawn.   Current Medications, Allergies, Past Medical History, Past Surgical History, Family History and Social History were reviewed in [employer] record.     Review of Systems: Bleeding since 12/17/21 with clots and cramps, sometimes heavy then light then skips a day  Physical Exam:BP 126/90 (BP Location: Left Arm, Patient Position: Sitting, Cuff Size: Normal)    Pulse 96    Ht 5\' 6"  (1.676 m)    Wt (!) 384 lb 6.4 oz (174.4 kg)    LMP 12/17/2021    Breastfeeding No    BMI 62.04 kg/m  UPT is negative. General:  Well developed, well nourished, no acute distress Skin:  Warm and dry Abdomen:  Soft, non tender, no hepatosplenomegaly,obese Pelvic:  External genitalia is normal in appearance, no lesions.  The vagina is normal in appearance. Urethra has no lesions or masses. The cervix is bulbous.Pap with GC/CHL and HR HPV genotyping performed.  Uterus is felt to be normal size, shape, and contour.  No adnexal masses,RLQ  tenderness noted.Bladder is non tender, no masses felt. Rectal:Deferred  Extremities/musculoskeletal:  No swelling or varicosities noted, no clubbing or cyanosis Psych:  No mood changes, alert and cooperative,seems happy Fall risk is low  Upstream - 01/07/22 1130       Pregnancy Intention Screening   Does the patient want to become pregnant in the next year? No    Does the patient's partner want to become pregnant in the next year? No    Would the patient like to discuss contraceptive options today? No      Contraception Wrap Up   Current Method Female Sterilization    End Method Female Sterilization     Contraception Counseling Provided No             Examination chaperoned by 01/09/22 LPN  Impression and Plan: 1. Pregnancy examination or test, negative result - POCT urine pregnancy  2. Routine cervical smear Pap sent Papin 3 years if normal - Cytology - PAP( [facility])  3. Abnormal uterine bleeding (AUB) Pelvic Malachy Mood scheduled at [facility] 01/15/22 at 10:30 am to assess uterus and ovaries  Will rx megace to stop bleeding Meds ordered this encounter  Medications   megestrol (MEGACE) 40 MG tablet    Sig: Take 2 daily    Dispense:  60 tablet    Refill:  2    Order Specific Question:   Supervising Provider    Answer:   03/17/22 H [[id number]]   Follow up with me in 2 weeks for ROS and Duane Lope review  - US PELVIC COMPLETE WITH TRANSVAGINAL; Future  4. Uterine cramping - US PELVIC COMPLETE WITH TRANSVAGINAL; Future  5. H/O tubal ligation  6. Right lower quadrant abdominal tenderness without rebound tenderness - US PELVIC COMPLETE WITH TRANSVAGINAL; Future

## 2022-01-09 LAB — CYTOLOGY - PAP
Chlamydia: NEGATIVE
Comment: NEGATIVE
Comment: NEGATIVE
Comment: NORMAL
Diagnosis: NEGATIVE
High risk HPV: NEGATIVE
Neisseria Gonorrhea: NEGATIVE

## 2022-01-14 ENCOUNTER — Other Ambulatory Visit: Payer: Self-pay

## 2022-01-14 ENCOUNTER — Ambulatory Visit (HOSPITAL_COMMUNITY)
Admission: RE | Admit: 2022-01-14 | Discharge: 2022-01-14 | Disposition: A | Discharge status: Home / Self Care | Payer: Medicaid Other | Source: Ambulatory Visit | Attending: Adult Health | Admitting: Adult Health

## 2022-01-14 DIAGNOSIS — R10813 Right lower quadrant abdominal tenderness: Secondary | ICD-10-CM

## 2022-01-14 DIAGNOSIS — N9489 Other specified conditions associated with female genital organs and menstrual cycle: Secondary | ICD-10-CM | POA: Diagnosis present

## 2022-01-14 DIAGNOSIS — N939 Abnormal uterine and vaginal bleeding, unspecified: Secondary | ICD-10-CM | POA: Diagnosis not present

## 2022-01-15 ENCOUNTER — Ambulatory Visit (HOSPITAL_COMMUNITY): Payer: Medicaid Other

## 2022-01-17 LAB — BASIC METABOLIC PANEL
BUN: 9 (ref 4–21)
CO2: 15 (ref 13–22)
Chloride: 110 — AB (ref 99–108)
Creatinine: 0.8 (ref 0.5–1.1)
Glucose: 243
Potassium: 4.1 mEq/L (ref 3.5–5.1)
Sodium: 139 (ref 137–147)

## 2022-01-17 LAB — HEPATIC FUNCTION PANEL
ALT: 17 U/L (ref 7–35)
AST: 25 (ref 13–35)
Alkaline Phosphatase: 149 — AB (ref 25–125)
Bilirubin, Direct: 0.1 (ref 0.01–0.4)
Bilirubin, Total: 0.3

## 2022-01-17 LAB — COMPREHENSIVE METABOLIC PANEL
Albumin: 3.8 (ref 3.5–5.0)
Calcium: 8.9 (ref 8.7–10.7)
Globulin: 2.7

## 2022-01-17 LAB — TSH: TSH: 2.38 (ref 0.41–5.90)

## 2022-01-17 LAB — HEMOGLOBIN A1C: Hemoglobin A1C: 6.2

## 2022-01-21 ENCOUNTER — Encounter: Payer: Self-pay | Admitting: Adult Health

## 2022-01-21 ENCOUNTER — Other Ambulatory Visit: Payer: Self-pay

## 2022-01-21 ENCOUNTER — Ambulatory Visit: Payer: Medicaid Other | Admitting: Adult Health

## 2022-01-21 VITALS — BP 116/80 | HR 108 | Ht 66.0 in | Wt 385.8 lb

## 2022-01-21 DIAGNOSIS — N83201 Unspecified ovarian cyst, right side: Secondary | ICD-10-CM | POA: Insufficient documentation

## 2022-01-21 DIAGNOSIS — N939 Abnormal uterine and vaginal bleeding, unspecified: Secondary | ICD-10-CM | POA: Diagnosis not present

## 2022-01-21 NOTE — Progress Notes (Signed)
?  Subjective:  ?  ? Patient ID: [name], female   DOB: October 23, 1986, 36 y.o.   MRN: [id number] ? ?HPI ?Chantal is a 36 year old white female,married but separated, G2P2 back in follow up on bleeding and is has stopped with megace, spotted Saturday, has some pain RLQ at times.  She had labs at Dr Letitia Neri Friday.  ?PCP is Dr Felecia Shelling. ? ?Lab Results  ?Component Value Date  ? DIAGPAP  01/07/2022  ?  - Negative for intraepithelial lesion or malignancy (NILM)  ? HPV NOT DETECTED 08/31/2018  ? HPVHIGH Negative 01/07/2022  ?  ?Review of Systems ?Has RQ pain at times ?Bleeding stopped with megace ?Reviewed past medical,surgical, social and family history. Reviewed medications and allergies.  ?   ?Objective:  ? Physical Exam ?BP 116/80 (BP Location: Left Arm, Patient Position: Sitting, Cuff Size: Normal)   Pulse (!) 108   Ht 5\' 6"  (1.676 m)   Wt (!) 385 lb 12.8 oz (175 kg)   LMP 01/17/2022 (Approximate)   Breastfeeding No   BMI 62.27 kg/m?   ?  Skin warm and dry.Lungs: clear to ausculation bilaterally. Cardiovascular: regular rate and rhythm.  ?Reviewed 03/19/2022 with her. ?Small amount of fluid is seen in the endometrial cavity. There is ?1.5 cm hyperechoic structure in the endometrial cavity within the ?fundus. This may be due to secretory phase of menstrual cycle or ?endometrial hyperplasia or endometrial polyp. There is no ?significant increased vascularity in the endometrium. Short-term ?follow-up pelvic sonogram in 1-2 months may be considered to assess ?resolution. ?  ?There are few cysts in the right ovary largest measuring 3.1 cm, ?possibly functional ovarian cyst. Left ovary is not sonographically visualized. ? ? Upstream - 01/21/22 0844   ? ?  ? Pregnancy Intention Screening  ? Does the patient want to become pregnant in the next year? N/A   ? Does the patient's partner want to become pregnant in the next year? N/A   ? Would the patient like to discuss contraceptive options today? N/A   ?  ? Contraception Wrap Up  ?  Current Method Female Sterilization   ? End Method Female Sterilization   ? Contraception Counseling Provided No   ? ?  ?  ? ?  ?  ?Assessment:  ?   ?1. Cyst of right ovary ?Has some pain a times, will get follow up 03/23/22 in about weeks, to see if resolved  ? ?2. Abnormal uterine bleeding (AUB) ?Has stopped with megace, will continue to take 2 daily, has refills  ?  Will get Follow up US in about 4 weeks  ?Plan:  ?   ?Return in 4 weeks for pelvic US and see me  ?   ?

## 2022-02-18 ENCOUNTER — Encounter: Payer: Self-pay | Admitting: Adult Health

## 2022-02-18 ENCOUNTER — Ambulatory Visit: Payer: Medicaid Other | Admitting: Adult Health

## 2022-02-18 ENCOUNTER — Ambulatory Visit (INDEPENDENT_AMBULATORY_CARE_PROVIDER_SITE_OTHER): Payer: Medicaid Other

## 2022-02-18 VITALS — BP 130/93 | HR 110 | Ht 66.0 in | Wt 393.2 lb

## 2022-02-18 DIAGNOSIS — N939 Abnormal uterine and vaginal bleeding, unspecified: Secondary | ICD-10-CM

## 2022-02-18 DIAGNOSIS — N83201 Unspecified ovarian cyst, right side: Secondary | ICD-10-CM | POA: Diagnosis not present

## 2022-02-18 DIAGNOSIS — R9389 Abnormal findings on diagnostic imaging of other specified body structures: Secondary | ICD-10-CM

## 2022-02-18 DIAGNOSIS — Z9851 Tubal ligation status: Secondary | ICD-10-CM

## 2022-02-18 NOTE — Progress Notes (Signed)
PELVIC US TA/TV:homogeneous retroverted uterus,WNL,complex thickened endometrium with color flow,echogenic mass with color flow and simple fluid within the endometrium 1.2 x 1.1 x 1.5 cm,EEC 23 mm,normal ovaries,no free fluid,some discomfort during ultrasound ? ?Chaperone Tammy ?

## 2022-02-18 NOTE — Progress Notes (Signed)
?  Subjective:  ?  ? Patient ID: [name], female   DOB: 1986/08/14, 36 y.o.   MRN: [id number] ? ?HPI ?[name] is a 36 year old white female, separated, G2P2 , in for [place] for history of cyst on right ovary.  ?Lab Results  ?Component Value Date  ? DIAGPAP  01/07/2022  ?  - Negative for intraepithelial lesion or malignancy (NILM)  ? HPV NOT DETECTED 08/31/2018  ? HPVHIGH Negative 01/07/2022  ?  ?Review of Systems ?No bleeding ?Had some pain with [place] ?Reviewed past medical,surgical, social and family history. Reviewed medications and allergies.  ?   ?Objective:  ? Physical Exam ?BP (!) 130/93 (BP Location: Left Arm, Patient Position: Sitting, Cuff Size: Normal)   Pulse (!) 110   Ht 5\' 6"  (1.676 m)   Wt (!) 393 lb 3.2 oz (178.4 kg)   LMP 12/17/2021 Comment: takes Megace  Breastfeeding No   BMI 63.46 kg/m?  Skin warm and dry. Lungs: clear to ausculation bilaterally. Cardiovascular: regular rate and rhythm.  ?  02/14/2022 showed normal ovaries, no cyst today, has complex thickened endometrium with echogenic mass and simple fluid. EEC 23 mm and mass measures 1.5 x 1.2 x 1.1 cm ?Fall risk is low ? Upstream - 02/18/22 1037   ? ?  ? Pregnancy Intention Screening  ? Does the patient want to become pregnant in the next year? No   ? Does the patient's partner want to become pregnant in the next year? No   ? Would the patient like to discuss contraceptive options today? No   ?  ? Contraception Wrap Up  ? Current Method Female Sterilization   ? End Method Female Sterilization   ? ?  ?  ? ?  ?  ?Assessment:  ?   ?1. Thickened endometrium ?Will get appt with Dr 04/20/22 sonohysterogram and biopsy vs  hysteroscopy D&C with ablation ?Handout given for ablation ? ?2. Abnormal uterine bleeding (AUB), resolved with megace ?Taking 2 daily ? ?3. Cyst of right ovary, has resolved ? ?4. H/O tubal ligation ? ?   ?Plan:  ?   ?Return in 1 week to see Dr Kathyrn Lass  ?   ?

## 2022-02-25 ENCOUNTER — Ambulatory Visit: Payer: Medicaid Other | Admitting: Obstetrics & Gynecology

## 2022-02-25 ENCOUNTER — Encounter: Payer: Self-pay | Admitting: Obstetrics & Gynecology

## 2022-02-25 VITALS — BP 118/82 | HR 75 | Wt 393.0 lb

## 2022-02-25 DIAGNOSIS — R9389 Abnormal findings on diagnostic imaging of other specified body structures: Secondary | ICD-10-CM

## 2022-02-25 DIAGNOSIS — Z6841 Body Mass Index (BMI) 40.0 and over, adult: Secondary | ICD-10-CM | POA: Diagnosis not present

## 2022-02-25 DIAGNOSIS — N939 Abnormal uterine and vaginal bleeding, unspecified: Secondary | ICD-10-CM | POA: Diagnosis not present

## 2022-02-25 NOTE — Progress Notes (Signed)
? ?  GYN VISIT ?Patient name: [name] MRN [id number]  Date of birth: [birth year] ?Chief Complaint:   ?AUB ? ?History of Present Illness:   ?[name] is a 36 y.o. G64P2002 female being seen today for the following concerns: ? ?Feb. Period would not stop- started on Megace 2 tabs daily.  She has been on this medication since that time.  Since Feb she has not had any bleeding.  Prior to February, her periods were mostly regular. ? ?Of note, upon review of her records, it does seem like prior to her last pregnancy she has noted some RLQ pain and irregular bleeding.  She previously had been on [place]. ? ?[place] completed 02/18/22: 6.1cm homogeneous retroverted uterus,complex thickened endometrium with color flow,echogenic mass with color flow and simple fluid within the endometrium 1.2 x 1.1 x 1.5 cm,EEC 23 mm,normal ovaries,no free fluid/ ? ?No LMP recorded. ? ? ?  09/23/2020  ?  9:00 AM 07/04/2020  ? 10:03 AM 08/31/2018  ?  8:42 AM 08/18/2017  ?  8:39 AM 09/24/2016  ? 11:41 AM  ?Depression screen PHQ 2/9  ?Decreased Interest 1 2 0 0 0  ?Down, Depressed, Hopeless 0 1 0 0 0  ?PHQ - 2 Score 1 3 0 0 0  ?Altered sleeping 1 0     ?Tired, decreased energy 0 2     ?Change in appetite 0 0     ?Feeling bad or failure about yourself  0 0     ?Trouble concentrating 0 0     ?Moving slowly or fidgety/restless 1 0     ?Suicidal thoughts 0 0     ?PHQ-9 Score 3 5     ? ? ? ?Review of Systems:   ?Pertinent items are noted in HPI ?Denies fever/chills, dizziness, headaches, visual disturbances, fatigue, shortness of breath, chest pain, abdominal pain, vomiting, bowel movements, urination, or intercourse unless otherwise stated above.  ?Pertinent History Reviewed:  ?Reviewed past medical,surgical, social, obstetrical and family history.  ?Reviewed problem list, medications and allergies. ?Physical Assessment:  ? ?Vitals:  ? 02/25/22 1427  ?BP: 118/82  ?Pulse: 75  ?Weight: (!) 393 lb (178.3 kg)  ?Body mass index is 63.43 kg/m?. ? ?     Physical  Examination:  ? General appearance: alert, well appearing, and in no distress ? Psych: mood appropriate, normal affect ? Skin: warm & dry  ? Cardiovascular: normal heart rate noted ? Respiratory: normal respiratory effort, no distress ? Abdomen: obese, soft, non-tender  ? Pelvic: examination not indicated ? Extremities: no edema  ? ?Chaperone: N/A   ? ?Assessment & Plan:  ?1) Abnormal uterine bleeding, Thickened endometrium ?-Reviewed [place] finding ?-Discussed conservative vs surgical intervention ?-Based on US findings would advise further evaluation- plan for hysteroscopy, D&C and possible polypectomy ?-Risk/benefits reviewed including but not limited to risk of bleeding, infection and injury to surrounding organs.  Questions and concerns were addressed and she desires to proceed at next available ?-CBC/BMP ordered for preop lab work ?-Referral created to surgical scheduler ? ?For now will decreased to Megace 40mg  daily and monitor bleeding pattern ?Ideally pt will discontinue this medication s/p surgery ? ? , DO ?Attending Obstetrician & Gynecologist, Faculty Practice ?[facility] ? ? ? ?

## 2022-03-12 NOTE — Patient Instructions (Addendum)
? ? ? ? ? ? ? ? [name] ? 03/12/2022  ?  ? @PREFPERIOPPHARMACY @ ? ? Your procedure is scheduled on  03/17/2022. ? ? Report to 05/17/2022 at  1130  A.M. ? ? Call this number if you have problems the morning of surgery: ? [phone] ? ? Remember: ? Do not eat or drink after midnight. ?       ?  ? Take these medicines the morning of surgery with A SIP OF WATER  ? ?     cymbalta, hydroxyzine, oxycodone or tramadol(if needed), protonix, lyrica, propranolol, maxalt(if needed), zanaflex (if needed), trokendi. ?  ? ? Do not wear jewelry, make-up or nail polish. ? Do not wear lotions, powders, or perfumes, or deodorant. ? Do not shave 48 hours prior to surgery.  Men may shave face and neck. ? Do not bring valuables to the hospital. ? [facility] is not responsible for any belongings or valuables. ? ?Contacts, dentures or bridgework may not be worn into surgery.  Leave your suitcase in the car.  After surgery it may be brought to your room. ? ?For patients admitted to the hospital, discharge time will be determined by your treatment team. ? ?Patients discharged the day of surgery will not be allowed to drive home and must have someone with them for 24 hours.  ? ? ?Special instructions:   DO NOT smoke tobacco or vape for 24 hours before your procedure. ? ?Please read over the following fact sheets that you were given. ?Anesthesia Post-op Instructions and Care and Recovery After Surgery ?  ? ? ? Dilation and Curettage or Vacuum Curettage, Care After ?The following information offers guidance on how to care for yourself after your procedure. Your doctor may also give you more specific instructions. If you have problems or questions, contact your doctor. ?What can I expect after the procedure? ?After the procedure, it is common to have: ?Mild pain or cramps. ?Some bleeding or spotting from the vagina. ?These may last for up to 2 weeks. ?Follow these instructions at home: ?Medicines ?Take over-the-counter and prescription  medicines only as told by your doctor. ?If told, take steps to prevent problems with pooping (constipation). You may need to: ?Drink enough fluid to keep your pee (urine) pale yellow. ?Take medicines. You will be told what medicines to take. ?Eat foods that are high in fiber. These include beans, whole grains, and fresh fruits and vegetables. ?Limit foods that are high in fat and sugar. These include fried or sweet foods. ?Ask your doctor if you should avoid driving or using machines while you are taking your medicine. ?Activity ? ?If you were given a medicine to help you relax (sedative) during your procedure, it can affect you for many hours. Do not drive or use machinery until your doctor says that it is safe. ?Rest as told by your doctor. ?Get up to take short walks every 1-2 hours. Ask for help if you feel weak or unsteady. ?Do not lift anything that is heavier than 10 lb (4.5 kg), or the limit that you are told. ?Return to your normal activities when your doctor says that it is safe. ?Lifestyle ?For at least 2 weeks, or as long as told by your doctor: ?Do not douche. ?Do not use tampons. ?Do not have sex. ?General instructions ?Do not take baths, swim, or use a hot tub. Ask your doctor if you may take showers. ?Do not smoke or use any products that contain nicotine  or tobacco. These can delay healing. If you need help quitting, ask your doctor. ?Wear compression stockings as told by your doctor. ?It is up to you to get the results of your procedure. Ask how to get your results when they are ready. ?Keep all follow-up visits. ?Contact a doctor if: ?You have very bad cramps that get worse or do not get better with medicine. ?You have very bad pain in your belly (abdomen). ?You cannot drink fluids without vomiting. ?You have pain in the area just above your thighs. ?You have fluid from your vagina that smells bad. ?You have a rash. ?Get help right away if: ?You are bleeding a lot from your vagina. This means  soaking more than one sanitary pad in 1 hour, and this happens for 2 hours in a row. ?You have a fever that is above 100.4?F (38?C). ?Your belly feels very tender or hard. ?You have chest pain. ?You have trouble breathing. ?You feel dizzy or light-headed. ?You faint. ?You have pain in your neck or shoulder area. ?These symptoms may be an emergency. Get help right away. Call your local emergency services (911 in the [place] ?Do not wait to see if the symptoms will go away. ?Do not drive yourself to the hospital. ?Summary ?After your procedure, it is common to have pain or cramping. It is also common to have bleeding or spotting from your vagina. ?Rest as told. Get up to take short walks every 1-2 hours. ?Do not lift anything that is heavier than 10 lb (4.5 kg), or the limit that you are told. ?Get help right away if you have problems from the procedure. Ask your doctor what problems to watch for. ?This information is not intended to replace advice given to you by your health care provider. Make sure you discuss any questions you have with your health care provider. ?Document Revised: 10/23/2020 Document Reviewed: 10/23/2020 ?Elsevier Patient Education ? 2023 [employer] ?General Anesthesia, Adult, Care After ?This sheet gives you information about how to care for yourself after your procedure. Your health care provider may also give you more specific instructions. If you have problems or questions, contact your health care provider. ?What can I expect after the procedure? ?After the procedure, the following side effects are common: ?Pain or discomfort at the IV site. ?Nausea. ?Vomiting. ?Sore throat. ?Trouble concentrating. ?Feeling cold or chills. ?Feeling weak or tired. ?Sleepiness and fatigue. ?Soreness and body aches. These side effects can affect parts of the body that were not involved in surgery. ?Follow these instructions at home: ?For the time period you were told by your health care provider: ? ?Rest. ?Do  not participate in activities where you could fall or become injured. ?Do not drive or use machinery. ?Do not drink alcohol. ?Do not take sleeping pills or medicines that cause drowsiness. ?Do not make important decisions or sign legal documents. ?Do not take care of children on your own. ?Eating and drinking ?Follow any instructions from your health care provider about eating or drinking restrictions. ?When you feel hungry, start by eating small amounts of foods that are soft and easy to digest (bland), such as toast. Gradually return to your regular diet. ?Drink enough fluid to keep your urine pale yellow. ?If you vomit, rehydrate by drinking water, juice, or clear broth. ?General instructions ?If you have sleep apnea, surgery and certain medicines can increase your risk for breathing problems. Follow instructions from your health care provider about wearing your sleep device: ?Anytime you are sleeping,  including during daytime naps. ?While taking prescription pain medicines, sleeping medicines, or medicines that make you drowsy. ?Have a responsible adult stay with you for the time you are told. It is important to have someone help care for you until you are awake and alert. ?Return to your normal activities as told by your health care provider. Ask your health care provider what activities are safe for you. ?Take over-the-counter and prescription medicines only as told by your health care provider. ?If you smoke, do not smoke without supervision. ?Keep all follow-up visits as told by your health care provider. This is important. ?Contact a health care provider if: ?You have nausea or vomiting that does not get better with medicine. ?You cannot eat or drink without vomiting. ?You have pain that does not get better with medicine. ?You are unable to pass urine. ?You develop a skin rash. ?You have a fever. ?You have redness around your IV site that gets worse. ?Get help right away if: ?You have difficulty  breathing. ?You have chest pain. ?You have blood in your urine or stool, or you vomit blood. ?Summary ?After the procedure, it is common to have a sore throat or nausea. It is also common to feel tired. ?Have a respon

## 2022-03-13 ENCOUNTER — Encounter (HOSPITAL_COMMUNITY): Payer: Self-pay

## 2022-03-13 ENCOUNTER — Encounter (HOSPITAL_COMMUNITY)
Admission: RE | Admit: 2022-03-13 | Discharge: 2022-03-13 | Disposition: A | Discharge status: Home / Self Care | Payer: Medicaid Other | Source: Ambulatory Visit | Attending: Obstetrics & Gynecology | Admitting: Obstetrics & Gynecology

## 2022-03-13 VITALS — BP 84/60 | HR 97 | Temp 97.8°F | Resp 18 | Ht 66.0 in | Wt 393.0 lb

## 2022-03-13 DIAGNOSIS — R748 Abnormal levels of other serum enzymes: Secondary | ICD-10-CM | POA: Insufficient documentation

## 2022-03-13 DIAGNOSIS — Z01812 Encounter for preprocedural laboratory examination: Secondary | ICD-10-CM | POA: Diagnosis present

## 2022-03-13 DIAGNOSIS — N939 Abnormal uterine and vaginal bleeding, unspecified: Secondary | ICD-10-CM | POA: Insufficient documentation

## 2022-03-13 DIAGNOSIS — Z01818 Encounter for other preprocedural examination: Secondary | ICD-10-CM

## 2022-03-13 HISTORY — DX: Prediabetes: R73.03

## 2022-03-13 HISTORY — DX: Panic disorder (episodic paroxysmal anxiety): F41.0

## 2022-03-13 HISTORY — DX: Essential (primary) hypertension: I10

## 2022-03-13 LAB — COMPREHENSIVE METABOLIC PANEL WITH GFR
ALT: 19 U/L (ref 0–44)
AST: 25 U/L (ref 15–41)
Albumin: 3.5 g/dL (ref 3.5–5.0)
Alkaline Phosphatase: 112 U/L (ref 38–126)
Anion gap: 7 (ref 5–15)
BUN: 11 mg/dL (ref 6–20)
CO2: 19 mmol/L — ABNORMAL LOW (ref 22–32)
Calcium: 8.6 mg/dL — ABNORMAL LOW (ref 8.9–10.3)
Chloride: 112 mmol/L — ABNORMAL HIGH (ref 98–111)
Creatinine, Ser: 0.92 mg/dL (ref 0.44–1.00)
GFR, Estimated: >60 mL/min
Glucose, Bld: 163 mg/dL — ABNORMAL HIGH (ref 70–99)
Potassium: 3.6 mmol/L (ref 3.5–5.1)
Sodium: 138 mmol/L (ref 135–145)
Total Bilirubin: 0.4 mg/dL (ref 0.3–1.2)
Total Protein: 7 g/dL (ref 6.5–8.1)

## 2022-03-13 LAB — CBC WITH DIFFERENTIAL/PLATELET
Abs Immature Granulocytes: 0.04 K/uL (ref 0.00–0.07)
Basophils Absolute: 0.1 K/uL (ref 0.0–0.1)
Basophils Relative: 1 %
Eosinophils Absolute: 0.2 K/uL (ref 0.0–0.5)
Eosinophils Relative: 2 %
HCT: 41.3 % (ref 36.0–46.0)
Hemoglobin: 13.6 g/dL (ref 12.0–15.0)
Immature Granulocytes: 0 %
Lymphocytes Relative: 29 %
Lymphs Abs: 3.2 K/uL (ref 0.7–4.0)
MCH: 28.7 pg (ref 26.0–34.0)
MCHC: 32.9 g/dL (ref 30.0–36.0)
MCV: 87.1 fL (ref 80.0–100.0)
Monocytes Absolute: 0.8 K/uL (ref 0.1–1.0)
Monocytes Relative: 7 %
Neutro Abs: 6.7 K/uL (ref 1.7–7.7)
Neutrophils Relative %: 61 %
Platelets: 287 K/uL (ref 150–400)
RBC: 4.74 MIL/uL (ref 3.87–5.11)
RDW: 13.5 % (ref 11.5–15.5)
WBC: 11 K/uL — ABNORMAL HIGH (ref 4.0–10.5)
nRBC: 0 % (ref 0.0–0.2)

## 2022-03-13 LAB — PREGNANCY, URINE: Preg Test, Ur: NEGATIVE

## 2022-03-16 NOTE — H&P (Addendum)
[facility] Attending History and Physical ? ?[name] is a 36 y.o. G2P2002 at [facility] who presents for scheduled hysteroscopy, D&C, possible polypectomy ? ?In review, she has noted AUB.  Currently on megace, which has controlled the bleeding.  She does not currently have any bleeding while on the Megace.  Options were reviewed and desired surgical intervention to remove the polyp and improve the bleeding.   ?Denies any abnormal vaginal discharge, fevers, chills, sweats, dysuria, nausea, vomiting, other GI or GU symptoms or other general symptoms. ? ?[place] completed: 6.1cm homogeneous retroverted uterus,complex thickened endometrium with color flow,echogenic mass with color flow and simple fluid within the endometrium 1.2 x 1.1 x 1.5 cm,EEC 23 mm,normal ovaries,no free fluid ?  ? ?Past Medical History:  ?Diagnosis Date  ? Abdominal pain, chronic, epigastric   ? Abdominal pain, chronic, right lower quadrant   ? Anxiety   ? Bulging disc   ? Constipation   ? Contraceptive management 07/18/2014  ? Depression   ? Disk prolapse   ? Elevated liver enzymes 12/01/2013  ? Fatty liver 07/23/2017  ? Gastritis   ? GERD (gastroesophageal reflux disease)   ? Headache   ? Herpes   ? Hypertension   ? Mental disorder   ? anxiety  ? Obesity   ? Other and unspecified ovarian cyst 12/01/2013  ? Ovarian cyst, right   ? Panic attacks   ? Pre-diabetes   ? Splenomegaly 07/23/2017  ? ?Past Surgical History:  ?Procedure Laterality Date  ? CHOLECYSTECTOMY N/A 08/14/2021  ? Procedure: LAPAROSCOPIC CHOLECYSTECTOMY WITH INTRAOPERATIVE CHOLANGIOGRAM;  Surgeon: Darnell Level, MD;  Location: [facility];  Service: General;  Laterality: N/A;  ? COLONOSCOPY WITH ESOPHAGOGASTRODUODENOSCOPY (EGD) N/A 02/24/2013  ? JGG:[id number] RECTAL BLEEDING DUE TO Moderate sized internal hemorrhoids, EGD: erosive esophagitis due to uncontrolled reflux, moderate erosive gastritis, benign path  ? ESOPHAGOGASTRODUODENOSCOPY  01/19/2012  ?  TKP:TWSFKC,LEXNTZGY IN THE ANTRUM/HIATAL HERNIA/  ? ESOPHAGOGASTRODUODENOSCOPY N/A 07/31/2016  ? Procedure: ESOPHAGOGASTRODUODENOSCOPY (EGD);  Surgeon: West Bali, MD;  Location: [facility];  Service: Endoscopy;  Laterality: N/A;  10:15 AM  ? TUBAL LIGATION Bilateral 12/08/2020  ? Procedure: POST PARTUM TUBAL LIGATION;  Surgeon: Reva Bores, MD;  Location: [facility];  Service: Gynecology;  Laterality: Bilateral;  ? WISDOM TOOTH EXTRACTION    ? ?OB History  ?Gravida Para Term Preterm AB Living  ?2 2 2     2   ?SAB IAB Ectopic Multiple Live Births  ?      0 2  ?  ?# Outcome Date GA Lbr Len/2nd Weight Sex Delivery Anes PTL Lv  ?2 Term 12/07/20 [redacted]w[redacted]d 09:13 / 00:47 2690 g F Vag-Spont EPI  LIV  ?1 Term 09/15/08 [redacted]w[redacted]d  2948 g M Vag-Spont EPI N LIV  ?Patient denies any other pertinent gynecologic issues.  ?No current facility-administered medications on file prior to encounter.  ? ?Current Outpatient Medications on File Prior to Encounter  ?Medication Sig Dispense Refill  ? DULoxetine (CYMBALTA) 60 MG capsule Take 60 mg by mouth daily.    ? hydrOXYzine (ATARAX/VISTARIL) 25 MG tablet Take 25 mg by mouth every 8 (eight) hours as needed for anxiety.    ? megestrol (MEGACE) 40 MG tablet Take 2 daily (Patient taking differently: Take 40 mg by mouth daily.) 60 tablet 2  ? oxyCODONE-acetaminophen (PERCOCET) 7.5-325 MG tablet Take 1 tablet by mouth every 8 (eight) hours as needed for severe pain.    ? pantoprazole (PROTONIX) 40 MG  tablet TAKE 1 TABLET (40 MG TOTAL) BY MOUTH 2 (TWO) TIMES DAILY BEFORE A MEAL. (Patient taking differently: Take 80 mg by mouth daily.) 60 tablet 10  ? pregabalin (LYRICA) 75 MG capsule Take 75 mg by mouth 2 (two) times daily.    ? propranolol (INDERAL) 20 MG tablet Take 40 mg by mouth daily.    ? rizatriptan (MAXALT) 10 MG tablet Take 10 mg by mouth as needed for migraine. May repeat in 2 hours if needed    ? tiZANidine (ZANAFLEX) 2 MG tablet Take 2 mg by mouth 3 (three) times daily as needed for  muscle spasms.    ? Topiramate ER (TROKENDI XR) 200 MG CP24 Take 200 mg by mouth daily.    ? traMADol (ULTRAM) 50 MG tablet Take 100 mg by mouth every 8 (eight) hours as needed for moderate pain.    ? valACYclovir (VALTREX) 1000 MG tablet Take 1 tablet by mouth once daily 30 tablet 12  ? ?Allergies  ?Allergen Reactions  ? Tape Itching  ? Tapentadol Itching  ?  Pt unaware of this   ? Naproxen Other (See Comments)  ?  Was told by doctor not to take medication due to it messing up her esophagus .  ? ? ?Social History:   reports that she quit smoking about 10 years ago. Her smoking use included cigarettes. She has never used smokeless tobacco. She reports that she does not drink alcohol and does not use drugs. ?Family History  ?Problem Relation Age of Onset  ? Diabetes Maternal Grandmother   ? COPD Maternal Grandmother   ? Diabetes Maternal Grandfather   ? Congestive Heart Failure Maternal Grandfather   ? GER disease Mother   ? Other Mother   ?     diverticulitis; hernia  ? Diabetes Mother   ?     borderline  ? Anxiety disorder Mother   ? Depression Mother   ? Headache Son   ? ADD / ADHD Son   ? Diabetes Other   ? Colon cancer Neg Hx   ? Colon polyps Neg Hx   ? Gastric cancer Neg Hx   ? Esophageal cancer Neg Hx   ? ? ?Review of Systems: Pertinent items noted in HPI and remainder of comprehensive ROS otherwise negative. ? ?PHYSICAL EXAM: ?Blood pressure (!) 136/94, temperature 97.8 ?F (36.6 ?C), temperature source Oral, resp. rate 17, SpO2 99 %, not currently breastfeeding. ?CONSTITUTIONAL: Well-developed, well-nourished female in no acute distress.  ?SKIN: Skin is warm and dry. No rash noted. Not diaphoretic. No erythema. No pallor. ?NEUROLOGIC: Alert and oriented to person, place, and time.  ?PSYCHIATRIC: Normal mood and affect. Normal behavior. Normal judgment and thought content. ?CARDIOVASCULAR: Normal heart rate noted, regular rhythm ?RESPIRATORY: Effort and breath sounds normal, no problems with respiration  noted ?ABDOMEN: obese, Soft, nontender, nondistended. ?PELVIC: deferred ?MUSCULOSKELETAL: no calf tenderness bilaterally ?EXT: no edema bilaterally, normal pulses ? ?Labs: ?Results for orders placed or performed during the hospital encounter of 03/17/22 (from the past 336 hour(s))  ?Glucose, capillary  ? Collection Time: 03/17/22 12:28 PM  ?Result Value Ref Range  ? Glucose-Capillary 115 (H) 70 - 99 mg/dL  ?Results for orders placed or performed during the hospital encounter of 03/13/22 (from the past 336 hour(s))  ?CBC with Differential/Platelet  ? Collection Time: 03/13/22  2:36 PM  ?Result Value Ref Range  ? WBC 11.0 (H) 4.0 - 10.5 K/uL  ? RBC 4.74 3.87 - 5.11 MIL/uL  ? Hemoglobin 13.6 12.0 -  15.0 g/dL  ? HCT 41.3 36.0 - 46.0 %  ? MCV 87.1 80.0 - 100.0 fL  ? MCH 28.7 26.0 - 34.0 pg  ? MCHC 32.9 30.0 - 36.0 g/dL  ? RDW 13.5 11.5 - 15.5 %  ? Platelets 287 150 - 400 K/uL  ? nRBC 0.0 0.0 - 0.2 %  ? Neutrophils Relative % 61 %  ? Neutro Abs 6.7 1.7 - 7.7 K/uL  ? Lymphocytes Relative 29 %  ? Lymphs Abs 3.2 0.7 - 4.0 K/uL  ? Monocytes Relative 7 %  ? Monocytes Absolute 0.8 0.1 - 1.0 K/uL  ? Eosinophils Relative 2 %  ? Eosinophils Absolute 0.2 0.0 - 0.5 K/uL  ? Basophils Relative 1 %  ? Basophils Absolute 0.1 0.0 - 0.1 K/uL  ? Immature Granulocytes 0 %  ? Abs Immature Granulocytes 0.04 0.00 - 0.07 K/uL  ?Comprehensive metabolic panel  ? Collection Time: 03/13/22  2:36 PM  ?Result Value Ref Range  ? Sodium 138 135 - 145 mmol/L  ? Potassium 3.6 3.5 - 5.1 mmol/L  ? Chloride 112 (H) 98 - 111 mmol/L  ? CO2 19 (L) 22 - 32 mmol/L  ? Glucose, Bld 163 (H) 70 - 99 mg/dL  ? BUN 11 6 - 20 mg/dL  ? Creatinine, Ser 0.92 0.44 - 1.00 mg/dL  ? Calcium 8.6 (L) 8.9 - 10.3 mg/dL  ? Total Protein 7.0 6.5 - 8.1 g/dL  ? Albumin 3.5 3.5 - 5.0 g/dL  ? AST 25 15 - 41 U/L  ? ALT 19 0 - 44 U/L  ? Alkaline Phosphatase 112 38 - 126 U/L  ? Total Bilirubin 0.4 0.3 - 1.2 mg/dL  ? GFR, Estimated >60 >60 mL/min  ? Anion gap 7 5 - 15  ?Pregnancy, urine  ?  Collection Time: 03/13/22  2:36 PM  ?Result Value Ref Range  ? Preg Test, Ur NEGATIVE NEGATIVE  ? ? ?Imaging Studies: ?US PELVIC COMPLETE WITH TRANSVAGINAL ? ?Result Date: 02/25/2022 ?Images from the original res

## 2022-03-17 ENCOUNTER — Ambulatory Visit (HOSPITAL_COMMUNITY): Payer: Medicaid Other | Admitting: Certified Registered Nurse Anesthetist

## 2022-03-17 ENCOUNTER — Encounter (HOSPITAL_COMMUNITY): Payer: Self-pay | Admitting: Obstetrics & Gynecology

## 2022-03-17 ENCOUNTER — Ambulatory Visit (HOSPITAL_BASED_OUTPATIENT_CLINIC_OR_DEPARTMENT_OTHER): Payer: Medicaid Other | Admitting: Certified Registered Nurse Anesthetist

## 2022-03-17 ENCOUNTER — Other Ambulatory Visit: Payer: Self-pay

## 2022-03-17 ENCOUNTER — Encounter (HOSPITAL_COMMUNITY)
Admission: RE | Disposition: A | Discharge status: Home / Self Care | Payer: Self-pay | Source: Home / Self Care | Attending: Obstetrics & Gynecology

## 2022-03-17 ENCOUNTER — Ambulatory Visit (HOSPITAL_COMMUNITY)
Admission: RE | Admit: 2022-03-17 | Discharge: 2022-03-17 | Disposition: A | Discharge status: Home / Self Care | Payer: Medicaid Other | Attending: Obstetrics & Gynecology | Admitting: Obstetrics & Gynecology

## 2022-03-17 DIAGNOSIS — N938 Other specified abnormal uterine and vaginal bleeding: Secondary | ICD-10-CM

## 2022-03-17 DIAGNOSIS — Z79899 Other long term (current) drug therapy: Secondary | ICD-10-CM | POA: Diagnosis not present

## 2022-03-17 DIAGNOSIS — N939 Abnormal uterine and vaginal bleeding, unspecified: Secondary | ICD-10-CM | POA: Insufficient documentation

## 2022-03-17 DIAGNOSIS — N840 Polyp of corpus uteri: Secondary | ICD-10-CM

## 2022-03-17 DIAGNOSIS — Z87891 Personal history of nicotine dependence: Secondary | ICD-10-CM

## 2022-03-17 DIAGNOSIS — R9389 Abnormal findings on diagnostic imaging of other specified body structures: Secondary | ICD-10-CM

## 2022-03-17 DIAGNOSIS — I10 Essential (primary) hypertension: Secondary | ICD-10-CM

## 2022-03-17 DIAGNOSIS — F418 Other specified anxiety disorders: Secondary | ICD-10-CM

## 2022-03-17 DIAGNOSIS — F32A Depression, unspecified: Secondary | ICD-10-CM | POA: Diagnosis not present

## 2022-03-17 DIAGNOSIS — Z6841 Body Mass Index (BMI) 40.0 and over, adult: Secondary | ICD-10-CM | POA: Insufficient documentation

## 2022-03-17 DIAGNOSIS — K219 Gastro-esophageal reflux disease without esophagitis: Secondary | ICD-10-CM | POA: Insufficient documentation

## 2022-03-17 DIAGNOSIS — F419 Anxiety disorder, unspecified: Secondary | ICD-10-CM | POA: Diagnosis not present

## 2022-03-17 HISTORY — PX: POLYPECTOMY: SHX5525

## 2022-03-17 HISTORY — PX: HYSTEROSCOPY WITH D & C: SHX1775

## 2022-03-17 LAB — GLUCOSE, CAPILLARY: Glucose-Capillary: 115 mg/dL — ABNORMAL HIGH (ref 70–99)

## 2022-03-17 SURGERY — DILATATION AND CURETTAGE /HYSTEROSCOPY
Anesthesia: General | Site: Vagina

## 2022-03-17 MED ORDER — SUCCINYLCHOLINE CHLORIDE 200 MG/10ML IV SOSY
PREFILLED_SYRINGE | INTRAVENOUS | Status: AC
  Filled 2022-03-17: qty 10

## 2022-03-17 MED ORDER — ONDANSETRON HCL 4 MG/2ML IJ SOLN: 4.0000 mg | Freq: Once | INTRAMUSCULAR | Status: DC | PRN

## 2022-03-17 MED ORDER — ROCURONIUM BROMIDE 10 MG/ML (PF) SYRINGE
PREFILLED_SYRINGE | INTRAVENOUS | Status: AC
  Filled 2022-03-17: qty 10

## 2022-03-17 MED ORDER — CHLORHEXIDINE GLUCONATE 0.12 % MT SOLN: 15.0000 mL | Freq: Once | OROMUCOSAL | Status: DC

## 2022-03-17 MED ORDER — PROPOFOL 10 MG/ML IV BOLUS
INTRAVENOUS | Status: DC | PRN
  Administered 2022-03-17: 300 mg via INTRAVENOUS

## 2022-03-17 MED ORDER — POVIDONE-IODINE 10 % EX SWAB: 2.0000 "application " | Freq: Once | CUTANEOUS | Status: DC

## 2022-03-17 MED ORDER — ORAL CARE MOUTH RINSE: 15.0000 mL | Freq: Once | OROMUCOSAL | Status: DC

## 2022-03-17 MED ORDER — FENTANYL CITRATE PF 50 MCG/ML IJ SOSY: 25.0000 ug | PREFILLED_SYRINGE | INTRAMUSCULAR | Status: DC | PRN

## 2022-03-17 MED ORDER — LACTATED RINGERS IV SOLN: INTRAVENOUS | Status: DC

## 2022-03-17 MED ORDER — MIDAZOLAM HCL 2 MG/2ML IJ SOLN
INTRAMUSCULAR | Status: DC | PRN
Start: 2022-03-17 — End: 2022-03-17
  Administered 2022-03-17: 2 mg via INTRAVENOUS

## 2022-03-17 MED ORDER — PROPOFOL 10 MG/ML IV BOLUS
INTRAVENOUS | Status: AC
  Filled 2022-03-17: qty 20

## 2022-03-17 MED ORDER — BUPIVACAINE-EPINEPHRINE (PF) 0.5% -1:200000 IJ SOLN
INTRAMUSCULAR | Status: AC
  Filled 2022-03-17: qty 30

## 2022-03-17 MED ORDER — SILVER NITRATE-POT NITRATE 75-25 % EX MISC
CUTANEOUS | Status: DC | PRN
  Administered 2022-03-17: 1

## 2022-03-17 MED ORDER — FENTANYL CITRATE (PF) 100 MCG/2ML IJ SOLN
INTRAMUSCULAR | Status: DC | PRN
  Administered 2022-03-17: 100 ug via INTRAVENOUS

## 2022-03-17 MED ORDER — FENTANYL CITRATE (PF) 100 MCG/2ML IJ SOLN
INTRAMUSCULAR | Status: AC
  Filled 2022-03-17: qty 2

## 2022-03-17 MED ORDER — ONDANSETRON HCL 4 MG/2ML IJ SOLN
INTRAMUSCULAR | Status: DC | PRN
  Administered 2022-03-17: 4 mg via INTRAVENOUS

## 2022-03-17 MED ORDER — ONDANSETRON HCL 4 MG/2ML IJ SOLN
INTRAMUSCULAR | Status: AC
Start: 2022-03-17 — End: ?
  Filled 2022-03-17: qty 2

## 2022-03-17 MED ORDER — ACETAMINOPHEN 325 MG PO TABS: 650.0000 mg | ORAL_TABLET | Freq: Four times a day (QID) | ORAL | Status: DC | PRN

## 2022-03-17 MED ORDER — KETOROLAC TROMETHAMINE 30 MG/ML IJ SOLN
INTRAMUSCULAR | Status: AC
  Filled 2022-03-17: qty 1

## 2022-03-17 MED ORDER — SUCCINYLCHOLINE CHLORIDE 200 MG/10ML IV SOSY
PREFILLED_SYRINGE | INTRAVENOUS | Status: DC | PRN
  Administered 2022-03-17: 180 mg via INTRAVENOUS

## 2022-03-17 MED ORDER — MIDAZOLAM HCL 2 MG/2ML IJ SOLN
INTRAMUSCULAR | Status: AC
  Filled 2022-03-17: qty 2

## 2022-03-17 MED ORDER — LIDOCAINE HCL (CARDIAC) PF 100 MG/5ML IV SOSY
PREFILLED_SYRINGE | INTRAVENOUS | Status: DC | PRN
  Administered 2022-03-17: 60 mg via INTRATRACHEAL

## 2022-03-17 MED ORDER — SODIUM CHLORIDE 0.9 % IR SOLN
Status: DC | PRN
  Administered 2022-03-17: 3000 mL

## 2022-03-17 MED ORDER — BUPIVACAINE-EPINEPHRINE (PF) 0.5% -1:200000 IJ SOLN
INTRAMUSCULAR | Status: DC | PRN
  Administered 2022-03-17: 20 mL via PERINEURAL

## 2022-03-17 MED ORDER — LIDOCAINE HCL (PF) 2 % IJ SOLN
INTRAMUSCULAR | Status: AC
  Filled 2022-03-17: qty 5

## 2022-03-17 SURGICAL SUPPLY — 29 items
CLOTH BEACON ORANGE TIMEOUT ST (SAFETY) ×3 IMPLANT
COVER LIGHT HANDLE STERIS (MISCELLANEOUS) ×3 IMPLANT
COVER SURGICAL LIGHT HANDLE (MISCELLANEOUS) ×6 IMPLANT
DEVICE MYOSURE LITE (MISCELLANEOUS) ×1 IMPLANT
GAUZE 4X4 16PLY ~~LOC~~+RFID DBL (SPONGE) ×6 IMPLANT
GLOVE BIO SURGEON STRL SZ 6.5 (GLOVE) ×3 IMPLANT
GLOVE BIOGEL PI IND STRL 6.5 (GLOVE) ×2 IMPLANT
GLOVE BIOGEL PI IND STRL 7.0 (GLOVE) ×4 IMPLANT
GLOVE BIOGEL PI INDICATOR 6.5 (GLOVE) ×1
GLOVE BIOGEL PI INDICATOR 7.0 (GLOVE) ×2
GLOVE SURG LTX SZ6.5 (GLOVE) ×3 IMPLANT
GLOVE SURG SS PI 7.0 STRL IVOR (GLOVE) ×1 IMPLANT
GOWN STRL REUS W/ TWL LRG LVL3 (GOWN DISPOSABLE) ×2 IMPLANT
GOWN STRL REUS W/TWL LRG LVL3 (GOWN DISPOSABLE) ×5 IMPLANT
IV NS IRRIG 3000ML ARTHROMATIC (IV SOLUTION) ×3 IMPLANT
KIT PROCEDURE FLUENT (KITS) ×3 IMPLANT
KIT TURNOVER CYSTO (KITS) ×3 IMPLANT
KIT TURNOVER KIT A (KITS) ×3 IMPLANT
NDL HYPO 18GX1.5 BLUNT FILL (NEEDLE) ×2 IMPLANT
NEEDLE HYPO 18GX1.5 BLUNT FILL (NEEDLE) ×2 IMPLANT
PACK PERI GYN (CUSTOM PROCEDURE TRAY) ×3 IMPLANT
PAD ARMBOARD 7.5X6 YLW CONV (MISCELLANEOUS) ×6 IMPLANT
PAD TELFA 3X4 1S STER (GAUZE/BANDAGES/DRESSINGS) ×3 IMPLANT
SEAL ROD LENS SCOPE MYOSURE (ABLATOR) ×3 IMPLANT
SET BASIN LINEN APH (SET/KITS/TRAYS/PACK) ×3 IMPLANT
SYR 30ML LL (SYRINGE) ×3 IMPLANT
SYR CONTROL 10ML LL (SYRINGE) ×3 IMPLANT
TOWEL OR 17X26 4PK STRL BLUE (TOWEL DISPOSABLE) ×3 IMPLANT
UNDERPAD 30X36 HEAVY ABSORB (UNDERPADS AND DIAPERS) ×3 IMPLANT

## 2022-03-17 NOTE — Anesthesia Preprocedure Evaluation (Signed)
Anesthesia Evaluation  ?Patient identified by MRN, date of birth, ID band ?Patient awake ? ? ? ?Reviewed: ?Allergy & Precautions, NPO status , Patient's Chart, lab work & pertinent test results, reviewed documented beta blocker date and time  ? ?Airway ?Mallampati: II ? ?TM Distance: >3 FB ?Neck ROM: Full ? ? ? Dental ? ?(+) Missing, Dental Advisory Given ?  ?Pulmonary ?former smoker,  ?  ?Pulmonary exam normal ?breath sounds clear to auscultation ? ? ? ? ? ? Cardiovascular ?hypertension, Pt. on home beta blockers and Pt. on medications ?Normal cardiovascular exam ?Rhythm:Regular Rate:Normal ? ? ?  ?Neuro/Psych ? Headaches, PSYCHIATRIC DISORDERS Anxiety Depression  Neuromuscular disease   ? GI/Hepatic ?Neg liver ROS, GERD  Controlled and Medicated,  ?Endo/Other  ?Morbid obesity ? Renal/GU ?negative Renal ROS  ? ?  ?Musculoskeletal ?negative musculoskeletal ROS ?(+)  ? Abdominal ?(+) + obese,   ?Peds ? Hematology ? ?(+) Blood dyscrasia, anemia ,   ?Anesthesia Other Findings ? ? Reproductive/Obstetrics ? ?  ? ? ? ? ? ? ? ? ? ? ? ? ? ?  ?  ? ? ? ? ? ? ? ? ?Anesthesia Physical ? ?Anesthesia Plan ? ?ASA: 3 ? ?Anesthesia Plan: General  ? ?Post-op Pain Management:   ? ?Induction: Intravenous ? ?PONV Risk Score and Plan: 2 and Ondansetron and Treatment may vary due to age or medical condition ? ?Airway Management Planned: Oral ETT ? ?Additional Equipment:  ? ?Intra-op Plan:  ? ?Post-operative Plan: Extubation in OR ? ?Informed Consent: I have reviewed the patients History and Physical, chart, labs and discussed the procedure including the risks, benefits and alternatives for the proposed anesthesia with the patient or authorized representative who has indicated his/her understanding and acceptance.  ? ? ? ?Dental advisory given ? ?Plan Discussed with: CRNA ? ?Anesthesia Plan Comments:   ? ? ? ? ? ? ?Anesthesia Quick Evaluation ? ?

## 2022-03-17 NOTE — Discharge Instructions (Signed)
HOME INSTRUCTIONS ? ?Please note any unusual or excessive bleeding, pain, swelling. Mild dizziness or drowsiness are normal for about 24 hours after surgery. ?  ?Shower when comfortable ? ?Restrictions: No driving for 24 hours or while taking pain medications. ? ?Activity:  No heavy lifting (> 10 lbs), nothing in vagina (no tampons, douching, or intercourse) x 2 weeks; no tub baths for 2 weeks ?Vaginal spotting is expected but if your bleeding is heavy, period like,  please call the office ? ?Diet:  You may return to your regular diet.  Do not eat large meals.  Eat small frequent meals throughout the day.  Continue to drink a good amount of water at least 6-8 glasses of water per day, hydration is very important for the healing process. ? ?Pain Management: Take either tylenol or ibuprofen every 6 hours with food as needed for pain.   ? ?Alcohol -- Avoid for 24 hours and while taking pain medications. ? ?Nausea: Take sips of ginger ale or soda ? ?Fever -- Call physician if temperature over 101 degrees ? ?Follow up:  If you do not already have a follow up appointment scheduled, please call the office at [phone].  If you experience fever (a temperature greater than 100.4), pain unrelieved by pain medication, shortness of breath, swelling of a single leg, or any other symptoms which are concerning to you please the office immediately.   ?

## 2022-03-17 NOTE — Anesthesia Procedure Notes (Signed)
Procedure Name: Intubation ?Date/Time: 03/17/2022 1:37 PM ?Performed by: Karna Dupes, CRNA ?Pre-anesthesia Checklist: Patient identified, Emergency Drugs available, Suction available and Patient being monitored ?Patient Re-evaluated:Patient Re-evaluated prior to induction ?Oxygen Delivery Method: Circle system utilized ?Preoxygenation: Pre-oxygenation with 100% oxygen ?Induction Type: IV induction, Rapid sequence and Cricoid Pressure applied ?Ventilation: Mask ventilation without difficulty ?Laryngoscope Size: Mac and 3 ?Grade View: Grade I ?Tube type: Oral ?Tube size: 7.0 mm ?Number of attempts: 1 ?Airway Equipment and Method: Stylet ?Placement Confirmation: ETT inserted through vocal cords under direct vision, positive ETCO2 and breath sounds checked- equal and bilateral ?Secured at: 22 cm ?Tube secured with: Tape ?Dental Injury: Teeth and Oropharynx as per pre-operative assessment  ? ? ? ? ?

## 2022-03-17 NOTE — Transfer of Care (Signed)
Immediate Anesthesia Transfer of Care Note ? ?Patient: [name] ? ?Procedure(s) Performed: DILATATION AND CURETTAGE /HYSTEROSCOPY (Vagina ) ?POLYPECTOMY (Vagina ) ? ?Patient Location: [facility] ? ?Anesthesia Type:General ? ?Level of Consciousness: awake and alert  ? ?Airway & Oxygen Therapy: Patient Spontanous Breathing and Patient connected to nasal cannula oxygen ? ?Post-op Assessment: Report given to RN and Post -op Vital signs reviewed and stable ? ?Post vital signs: Reviewed and stable ? ?Last Vitals:  ?Vitals Value Taken Time  ?BP 135/76   ?Temp 99.4   ?Pulse 114   ?Resp 18   ?SpO2 99%   ? ? ?Last Pain:  ?Vitals:  ? 03/17/22 1220  ?TempSrc: Oral  ?PainSc: 8   ?   ? ?Patients Stated Pain Goal: 9 (03/17/22 1220) ? ?Complications: No notable events documented. ?

## 2022-03-17 NOTE — Anesthesia Postprocedure Evaluation (Signed)
Anesthesia Post Note ? ?Patient: [name] ? ?Procedure(s) Performed: DILATATION AND CURETTAGE /HYSTEROSCOPY (Vagina ) ?POLYPECTOMY (Vagina ) ? ?Patient location during evaluation: Phase II ?Anesthesia Type: General ?Level of consciousness: awake ?Pain management: pain level controlled ?Vital Signs Assessment: post-procedure vital signs reviewed and stable ?Respiratory status: spontaneous breathing and respiratory function stable ?Cardiovascular status: blood pressure returned to baseline and stable ?Postop Assessment: no headache and no apparent nausea or vomiting ?Anesthetic complications: no ?Comments: Late entry ? ? ?No notable events documented. ? ? ?Last Vitals:  ?Vitals:  ? 03/17/22 1326 03/17/22 1430  ?BP:  135/76  ?Pulse: 98 (!) 114  ?Resp: 15 12  ?Temp:    ?SpO2: 100% 100%  ?  ?Last Pain:  ?Vitals:  ? 03/17/22 1430  ?TempSrc:   ?PainSc: 0-No pain  ? ? ?  ?  ?  ?  ?  ?  ? ?Louann Sjogren ? ? ? ? ?

## 2022-03-17 NOTE — Op Note (Signed)
Operative Report ? ?PreOp: Abnormal uterine bleeding, uterine polyp ?PostOp: same ?Procedure:  Hysteroscopy, Dilation and Curettage, polypectomy ?Surgeon: Dr. Myna Hidalgo ?Anesthesia: General ?Complications:none ?EBL:  10cc ?IVF:700cc ? ?Findings:7cm retroverted uterus with thickened endometrium and uterine polyp, both ostia visualized ? ?Specimens: 1) endometrial curettings 2) Uterine polyp ? ?Procedure: The patient was taken to the operating room where she underwent general anesthesia without difficulty. The patient was placed in a low lithotomy position using Allen stirrups. The patient was examined with the findings as noted above.  She was then prepped and draped in the normal sterile fashion. A sterile speculum was inserted into the vagina. Cervical block was completed using 0.5% lidocaine with epinephrine.   A single tooth tenaculum was placed on the anterior lip of the cervix.  The uterus was then sounded to 7cm. The endocervical canal was then serially dilated to 14French using Hank dilators.  The diagnostic hysteroscope was then inserted without difficulty and noted to have the findings as listed above. Visualization was achieved using NS as a distending medium. The myosure was inserted and resection of the polyp was completed.  The hysteroscope with mysoure was removed.  Sharp curettage was performed.  The hysteroscope was reinserted and no uterine perforation was seen. All instrument were then removed. Hemostasis was observed at the cervical site using silver nitrazine.The patient was repositioned to the supine position. The patient tolerated the procedure without any complications and taken to recovery in stable condition.  ? ?Myna Hidalgo, DO ?Attending Obstetrician & Gynecologist, Faculty Practice ?Center for [employer], [facility] ? ? ? ? ?  ?

## 2022-03-19 LAB — SURGICAL PATHOLOGY

## 2022-03-20 ENCOUNTER — Encounter (HOSPITAL_COMMUNITY): Payer: Self-pay | Admitting: Obstetrics & Gynecology

## 2022-03-31 ENCOUNTER — Ambulatory Visit (INDEPENDENT_AMBULATORY_CARE_PROVIDER_SITE_OTHER): Payer: Medicaid Other | Admitting: "Endocrinology

## 2022-03-31 ENCOUNTER — Encounter: Payer: Self-pay | Admitting: "Endocrinology

## 2022-03-31 DIAGNOSIS — R7303 Prediabetes: Secondary | ICD-10-CM | POA: Diagnosis not present

## 2022-03-31 DIAGNOSIS — I10 Essential (primary) hypertension: Secondary | ICD-10-CM

## 2022-03-31 NOTE — Progress Notes (Signed)
? ?                                                             Endocrinology Consult Note  ?     03/31/2022, 3:00 PM ? ? ?Subjective:  ? ? Patient ID: [name], female    DOB: 09-04-1986.  ?[name] is being seen in consultation for management of currently uncontrolled symptomatic diabetes requested by  Carrolyn Meiers, MD. ? ? ?Past Medical History:  ?Diagnosis Date  ? Abdominal pain, chronic, epigastric   ? Abdominal pain, chronic, right lower quadrant   ? Anxiety   ? Bulging disc   ? Constipation   ? Contraceptive management 07/18/2014  ? Depression   ? Disk prolapse   ? Elevated liver enzymes 12/01/2013  ? Fatty liver 07/23/2017  ? Gastritis   ? GERD (gastroesophageal reflux disease)   ? Headache   ? Herpes   ? Hypertension   ? Mental disorder   ? anxiety  ? Obesity   ? Other and unspecified ovarian cyst 12/01/2013  ? Ovarian cyst, right   ? Panic attacks   ? Pre-diabetes   ? Splenomegaly 07/23/2017  ? ? ?Past Surgical History:  ?Procedure Laterality Date  ? CHOLECYSTECTOMY N/A 08/14/2021  ? Procedure: LAPAROSCOPIC CHOLECYSTECTOMY WITH INTRAOPERATIVE CHOLANGIOGRAM;  Surgeon: Armandina Gemma, MD;  Location: [facility];  Service: General;  Laterality: N/A;  ? COLONOSCOPY WITH ESOPHAGOGASTRODUODENOSCOPY (EGD) N/A 02/24/2013  ? [id number] RECTAL BLEEDING DUE TO Moderate sized internal hemorrhoids, EGD: erosive esophagitis due to uncontrolled reflux, moderate erosive gastritis, benign path  ? ESOPHAGOGASTRODUODENOSCOPY  01/19/2012  ? [id number] IN THE ANTRUM/HIATAL HERNIA/  ? ESOPHAGOGASTRODUODENOSCOPY N/A 07/31/2016  ? Procedure: ESOPHAGOGASTRODUODENOSCOPY (EGD);  Surgeon: Danie Binder, MD;  Location: [facility];  Service: Endoscopy;  Laterality: N/A;  10:15 AM  ? HYSTEROSCOPY WITH D & C N/A 03/17/2022  ? Procedure: DILATATION AND CURETTAGE /HYSTEROSCOPY;  Surgeon: Janyth Pupa, DO;  Location: [facility];  Service: Gynecology;  Laterality: N/A;  ? POLYPECTOMY N/A 03/17/2022  ?  Procedure: POLYPECTOMY;  Surgeon: Janyth Pupa, DO;  Location: [facility];  Service: Gynecology;  Laterality: N/A;  ? TUBAL LIGATION Bilateral 12/08/2020  ? Procedure: POST PARTUM TUBAL LIGATION;  Surgeon: Donnamae Jude, MD;  Location: [facility];  Service: Gynecology;  Laterality: Bilateral;  ? WISDOM TOOTH EXTRACTION    ? ? ?Social History  ? ?Socioeconomic History  ? Marital status: Married  ?  Spouse name: Not on file  ? Number of children: 1  ? Years of education: Not on file  ? Highest education level: Not on file  ?Occupational History  ?  Employer: [employer]  ? Occupation: Unemployed  ?Tobacco Use  ? Smoking status: Former  ?  Packs/day: 0.00  ?  Years: 0.50  ?  Pack years: 0.00  ?  Types: Cigarettes  ?  Quit date: 07/28/2011  ?  Years since quitting: 10.6  ? Smokeless tobacco: Never  ?Vaping Use  ? Vaping Use: Former  ?Substance and Sexual Activity  ? Alcohol use: No  ? Drug use: No  ? Sexual activity: Not Currently  ?  Birth control/protection: Surgical  ?  Comment: tubal  ?Other Topics Concern  ? Not on file  ?Social History Narrative  ?  Not on file  ? ?Social Determinants of Health  ? ?Financial Resource Strain: Not on file  ?Food Insecurity: Not on file  ?Transportation Needs: Not on file  ?Physical Activity: Not on file  ?Stress: Not on file  ?Social Connections: Not on file  ? ? ?Family History  ?Problem Relation Age of Onset  ? Kidney disease Mother   ? GER disease Mother   ? Other Mother   ?     diverticulitis; hernia  ? Diabetes Mother   ?     borderline  ? Anxiety disorder Mother   ? Depression Mother   ? Hyperlipidemia Mother   ? Osteoporosis Mother   ? Diabetes Maternal Grandmother   ? COPD Maternal Grandmother   ? Diabetes Maternal Grandfather   ? Congestive Heart Failure Maternal Grandfather   ? Headache Son   ? ADD / ADHD Son   ? Diabetes Other   ? Colon cancer Neg Hx   ? Colon polyps Neg Hx   ? Gastric cancer Neg Hx   ? Esophageal cancer Neg Hx   ? ? ?Outpatient Encounter Medications as of  03/31/2022  ?Medication Sig  ? acetaminophen (TYLENOL) 325 MG tablet Take 2 tablets (650 mg total) by mouth every 6 (six) hours as needed.  ? DULoxetine (CYMBALTA) 60 MG capsule Take 60 mg by mouth daily.  ? hydrOXYzine (ATARAX/VISTARIL) 25 MG tablet Take 25 mg by mouth every 8 (eight) hours as needed for anxiety.  ? megestrol (MEGACE) 40 MG tablet Take 2 daily (Patient taking differently: Take 40 mg by mouth daily.)  ? oxyCODONE-acetaminophen (PERCOCET) 7.5-325 MG tablet Take 1 tablet by mouth every 8 (eight) hours as needed for severe pain.  ? pantoprazole (PROTONIX) 40 MG tablet TAKE 1 TABLET (40 MG TOTAL) BY MOUTH 2 (TWO) TIMES DAILY BEFORE A MEAL. (Patient taking differently: Take 80 mg by mouth daily.)  ? pregabalin (LYRICA) 75 MG capsule Take 75 mg by mouth 2 (two) times daily.  ? propranolol (INDERAL) 20 MG tablet Take 40 mg by mouth daily.  ? rizatriptan (MAXALT) 10 MG tablet Take 10 mg by mouth as needed for migraine. May repeat in 2 hours if needed  ? tiZANidine (ZANAFLEX) 2 MG tablet Take 2 mg by mouth 3 (three) times daily as needed for muscle spasms.  ? Topiramate ER (TROKENDI XR) 200 MG CP24 Take 200 mg by mouth daily.  ? traMADol (ULTRAM) 50 MG tablet Take 100 mg by mouth every 8 (eight) hours as needed for moderate pain.  ? valACYclovir (VALTREX) 1000 MG tablet Take 1 tablet by mouth once daily  ? ?No facility-administered encounter medications on file as of 03/31/2022.  ? ? ?ALLERGIES: ?Allergies  ?Allergen Reactions  ? Tape Itching  ? Tapentadol Itching  ?  Pt unaware of this   ? Naproxen Other (See Comments)  ?  Was told by doctor not to take medication due to it messing up her esophagus .  ? ? ?VACCINATION STATUS: ?Immunization History  ?Administered Date(s) Administered  ? Influenza,inj,Quad PF,6+ Mos 08/26/2020  ? Influenza,trivalent, recombinat, inj, PF 08/26/2015  ? Tdap 01/03/2013, 09/23/2020  ? ? ?HPI ? ?Patient is accompanied by her mother to the clinic.  History is obtained directly from  the patient as well as chart review. ?Her major concern is progressive weight gain.  She reports that she has gained at least 50 pounds over the last year.  Her medical records also show that she gained about 63 pounds from February  2022 up to today. ?She admits to dietary indiscretions.  She has prediabetes, hypertension, diffuse joint pain. ?She does not know the status of her cholesterol. ?She tried phentermine briefly.  Otherwise she did not make any significant dietary plans. ?She is a mother of 2 children ages 57 years and 20-month.  She is willing and motivated to engage in make changes in her lifestyle. ?She denies history of coronary artery disease, CVA. ?She denies history of easy bruising, steroid exposure at large doses.  However from February 2023-May 2023, she did have exposure to Megace 40 mg p.o. daily for abnormal vaginal bleeding.  She has not taken Megace for 14 days now. ? ? Her medications include Cymbalta, oxycodone/acetaminophen, Lyrica, propanolol, topiramate, tramadol. ? ? ?Review of Systems  ?Constitutional:  Positive for fatigue. Negative for chills, fever and unexpected weight change.  ?HENT:  Negative for trouble swallowing and voice change.   ?Eyes:  Negative for visual disturbance.  ?Respiratory:  Negative for cough, shortness of breath and wheezing.   ?Cardiovascular:  Negative for chest pain, palpitations and leg swelling.  ?Gastrointestinal:  Negative for diarrhea, nausea and vomiting.  ?Endocrine: Negative for cold intolerance, heat intolerance, polydipsia, polyphagia and polyuria.  ?Musculoskeletal:  Positive for arthralgias. Negative for myalgias.  ?Skin:  Negative for color change, pallor, rash and wound.  ?Neurological:  Negative for seizures and headaches.  ?Psychiatric/Behavioral:  Negative for confusion and suicidal ideas.   ? ?Objective:  ?  ? ?  03/31/2022  ?  8:14 AM 03/17/2022  ?  3:23 PM 03/17/2022  ?  2:30 PM  ?Vitals with BMI  ?Height 5\' 6"     ?Weight 408 lbs    ?BMI 65.88     ?Systolic [id number]  ?Diastolic 66 97 76  ?Pulse 96 99 114  ? ? ?BP 100/66   Pulse 96   Ht 5\' 6"  (1.676 m)   Wt (!) 408 lb (185.1 kg)   BMI 65.85 kg/m?   ?Wt Readings from Last 3 Encounters:  ?05/16/

## 2022-03-31 NOTE — Patient Instructions (Signed)

## 2022-04-15 ENCOUNTER — Other Ambulatory Visit: Payer: Self-pay | Admitting: Neurology

## 2022-04-15 DIAGNOSIS — M461 Sacroiliitis, not elsewhere classified: Secondary | ICD-10-CM

## 2022-04-24 ENCOUNTER — Encounter: Payer: Medicaid Other | Admitting: Obstetrics & Gynecology

## 2022-04-29 ENCOUNTER — Encounter: Payer: Medicaid Other | Admitting: Obstetrics & Gynecology

## 2022-05-12 ENCOUNTER — Ambulatory Visit: Payer: Medicaid Other | Admitting: Nutrition

## 2022-07-01 ENCOUNTER — Ambulatory Visit: Payer: Medicaid Other | Admitting: "Endocrinology

## 2022-07-14 ENCOUNTER — Ambulatory Visit: Payer: Medicaid Other | Admitting: Obstetrics & Gynecology

## 2022-07-15 ENCOUNTER — Ambulatory Visit: Payer: Medicaid Other | Admitting: Obstetrics & Gynecology

## 2022-07-15 ENCOUNTER — Encounter: Payer: Self-pay | Admitting: Obstetrics & Gynecology

## 2022-07-15 VITALS — BP 126/82 | HR 94 | Ht 66.0 in | Wt >= 6400 oz

## 2022-07-15 DIAGNOSIS — O119 Pre-existing hypertension with pre-eclampsia, unspecified trimester: Secondary | ICD-10-CM

## 2022-07-15 DIAGNOSIS — N939 Abnormal uterine and vaginal bleeding, unspecified: Secondary | ICD-10-CM | POA: Diagnosis not present

## 2022-07-15 DIAGNOSIS — R7303 Prediabetes: Secondary | ICD-10-CM

## 2022-07-15 DIAGNOSIS — Z9851 Tubal ligation status: Secondary | ICD-10-CM | POA: Diagnosis not present

## 2022-07-15 DIAGNOSIS — Z6841 Body Mass Index (BMI) 40.0 and over, adult: Secondary | ICD-10-CM

## 2022-07-15 DIAGNOSIS — Z9049 Acquired absence of other specified parts of digestive tract: Secondary | ICD-10-CM

## 2022-07-15 MED ORDER — SLYND 4 MG PO TABS: 1.0000 | ORAL_TABLET | Freq: Every day | ORAL | 11 refills | Status: AC

## 2022-07-15 NOTE — Progress Notes (Signed)
GYN VISIT Patient name: [name] MRN [id number]  Date of birth: May 19, 1986 Chief Complaint:   Menorrhagia (Painful periods)  History of Present Illness:   [name] is a 36 y.o. [phone] female being seen today for the following concerns:  AUB:  S/p D&C in May and had not had a period since that time.  However, about a 1-2 wks ago, period restarted.  Notes passage of quarter-sized clots and considerable pain on her right side.  Taking some pain medication, which only takes the edge off.   Going through about 7-10 pads per day.   Patient's last menstrual period was 07/05/2022.  [place] completed 02/18/22: 6.1cm homogeneous retroverted uterus,complex thickened endometrium with color flow,echogenic mass with color flow and simple fluid within the endometrium 1.2 x 1.1 x 1.5 cm,EEC 23 mm,normal ovaries,no free fluid     09/23/2020    9:00 AM 07/04/2020   10:03 AM 08/31/2018    8:42 AM 08/18/2017    8:39 AM 09/24/2016   11:41 AM  Depression screen PHQ 2/9  Decreased Interest 1 2 0 0 0  Down, Depressed, Hopeless 0 1 0 0 0  PHQ - 2 Score 1 3 0 0 0  Altered sleeping 1 0     Tired, decreased energy 0 2     Change in appetite 0 0     Feeling bad or failure about yourself  0 0     Trouble concentrating 0 0     Moving slowly or fidgety/restless 1 0     Suicidal thoughts 0 0     PHQ-9 Score 3 5        Review of Systems:   Pertinent items are noted in HPI Denies fever/chills, dizziness, headaches, visual disturbances, fatigue, shortness of breath, chest pain. +constipation Pertinent History Reviewed:  Reviewed past medical,surgical, social, obstetrical and family history.  Reviewed problem list, medications and allergies.  NSVD x 2, no prior abdominal surgery, only Lap choly Physical Assessment:   Vitals:   07/15/22 1443  BP: 126/82  Pulse: 94  Weight: (!) 406 lb (184.2 kg)  Height: 5\' 6"  (1.676 m)  Body mass index is 65.53 kg/m.       Physical Examination:   General  appearance: alert, well appearing, and in no distress  Psych: mood appropriate, normal affect  Skin: warm & dry   Cardiovascular: normal heart rate noted  Respiratory: normal respiratory effort, no distress  Abdomen: obese, soft, non-tender, no rebound, no guarding, no reproducible pain  Pelvic: VULVA: normal appearing vulva with no masses, tenderness or lesions, VAGINA: normal appearing vagina with normal color and discharge, no lesions, CERVIX: normal appearing cervix without discharge or lesions, UTERUS: seems mobile- exam limited due to body habitus  Extremities: no calf tenderness bilaterally  Chaperone:  pt declined     Assessment & Plan:  1) AUB -discussed medical vs surgical intervention -prior D&C with only minimal improvement -plan to start POPs, but ultimately desires hysterectomy -risk/benefit and alternatives reviewed with patient including risk of bleeding, infection and injury to surrounding organs -due to body habitus ideal approach would be vaginal  -trial of medication, if no improvement will proceed with surgical intervention  2) Medical co-morbidities -BMI 65 -HTN -pre DM []  plan to obtain medical clearance prior to proceeding with surgery   Meds ordered this encounter  Medications   Drospirenone (SLYND) 4 MG TABS    Sig: Take 1 tablet by mouth daily.    Dispense:  28  tablet    Refill:  11      Return in about 3 months (around 10/15/2022) for TBD/medication follow up.   Myna Hidalgo, DO Attending Obstetrician & Gynecologist, [facility] for [employer], [facility]

## 2022-07-21 ENCOUNTER — Encounter: Payer: Self-pay | Admitting: Obstetrics & Gynecology

## 2022-09-09 ENCOUNTER — Encounter: Payer: Self-pay | Admitting: Obstetrics & Gynecology

## 2022-09-09 ENCOUNTER — Ambulatory Visit (INDEPENDENT_AMBULATORY_CARE_PROVIDER_SITE_OTHER): Payer: Medicaid Other | Admitting: Obstetrics & Gynecology

## 2022-09-09 VITALS — BP 111/73 | HR 98 | Ht 66.0 in | Wt >= 6400 oz

## 2022-09-09 DIAGNOSIS — Z01818 Encounter for other preprocedural examination: Secondary | ICD-10-CM | POA: Diagnosis not present

## 2022-09-09 DIAGNOSIS — N939 Abnormal uterine and vaginal bleeding, unspecified: Secondary | ICD-10-CM | POA: Diagnosis not present

## 2022-09-09 DIAGNOSIS — O119 Pre-existing hypertension with pre-eclampsia, unspecified trimester: Secondary | ICD-10-CM

## 2022-09-09 DIAGNOSIS — Z9851 Tubal ligation status: Secondary | ICD-10-CM

## 2022-09-09 DIAGNOSIS — Z6841 Body Mass Index (BMI) 40.0 and over, adult: Secondary | ICD-10-CM

## 2022-09-09 NOTE — Progress Notes (Signed)
   GYN VISIT Patient name: [name] MRN [id number]  Date of birth: 11-Jan-1986 Chief Complaint:   Pre-op Exam  History of Present Illness:   [name] is a 36 y.o. G8P2002 female being seen today for preop examination  AUB: This has been a long-standing issue.  Tried both medication and s/p D&C with only short-term improvement.  Today she notes that she is having daily bleeding. Currently on Slynd- daily spotting- some days heavier than others.    Desires to proceed with permanent surgical intervention.  In review, menses were heavy each month with passage of large clots and considerable dysmenorrhea.  Typically using 7-10 pads per cycle.  Last [place] 03/2022: 8.1 x 4.1 x 4.7 cm = volume: 80.3 mL. There is inhomogeneous echogenicity without discrete nodules. Uterus is retroverted.  Normal ovaries bilaterally  PSH: tubal ligation, cholecystectomy  No LMP recorded. (Menstrual status: Irregular Periods).     09/23/2020    9:00 AM 07/04/2020   10:03 AM 08/31/2018    8:42 AM 08/18/2017    8:39 AM 09/24/2016   11:41 AM  Depression screen PHQ 2/9  Decreased Interest 1 2 0 0 0  Down, Depressed, Hopeless 0 1 0 0 0  PHQ - 2 Score 1 3 0 0 0  Altered sleeping 1 0     Tired, decreased energy 0 2     Change in appetite 0 0     Feeling bad or failure about yourself  0 0     Trouble concentrating 0 0     Moving slowly or fidgety/restless 1 0     Suicidal thoughts 0 0     PHQ-9 Score 3 5        Review of Systems:   Pertinent items are noted in HPI Denies fever/chills, dizziness, headaches, visual disturbances, fatigue, shortness of breath, chest pain, abdominal pain, vomiting, no problems with bowel movements or urination.  Not currently sexually active. Pertinent History Reviewed:  Reviewed past medical,surgical, social, obstetrical and family history.  Reviewed problem list, medications and allergies. Physical Assessment:   Vitals:   09/09/22 1526  BP: 111/73  Pulse: 98  Weight: (!)  403 lb (182.8 kg)  Height: 5\' 6"  (1.676 m)  Body mass index is 65.05 kg/m.       Physical Examination:   General appearance: alert, well appearing, and in no distress  Psych: mood appropriate, normal affect  Skin: warm & dry   Cardiovascular: normal heart rate noted  Respiratory: normal respiratory effort, no distress  Abdomen: obese, soft, non-tender   Pelvic: VULVA: normal appearing vulva with no masses, tenderness or lesions, VAGINA: normal appearing vagina with normal color and discharge, no lesions, CERVIX: normal appearing cervix without discharge or lesions, UTERUS: uterus is normal size, shape, consistency and nontender, ADNEXA: normal adnexa in size, nontender and no masses- bimanual exam limited due to body habitus- uterus appears mobile  Extremities: no edema   Chaperone:  mother present for exam     Assessment & Plan:  1) AUB -reviewed plan for vaginal hysterectomy -same day surgery, reviewed recovery/hospital expectations -discussed risk/benefit including but not limited to risk of bleeding, infection and injury to surrounding organs -questions/concerns were addressed and alternatives reviewed.  Pt desires to proceed with surgical intervention -preop appt scheduled, labs ordered  -morbid obesity -chronic HTN Medications reviewed    Janyth Pupa, DO Attending [place], [place] for [facility], [place]

## 2022-09-11 ENCOUNTER — Other Ambulatory Visit: Payer: Self-pay | Admitting: Adult Health

## 2022-09-16 NOTE — Patient Instructions (Signed)
[name]  09/16/2022     @[email or web address] @   Your procedure is scheduled on  09/22/2022.   [facility] at  0600  A.M.   Call this number if you have problems the morning of surgery:  [phone]  If you experience any cold or flu symptoms such as cough, fever, chills, shortness of breath, etc. between now and your scheduled surgery, please notify us at the above number.   Remember:  Do not eat or drink after midnight.      Take these medicines the morning of surgery with A SIP OF WATER        cymbalta, hydroxyzine, oxycodone or tramadol (if needed), protonix, lyrica, inderal, maxalt(if needed), zanaflex, trokendi.     Do not wear jewelry, make-up or nail polish.  Do not wear lotions, powders, or perfumes, or deodorant.  Do not shave 48 hours prior to surgery.  Men may shave face and neck.  Do not bring valuables to the hospital.  [facility] is not responsible for any belongings or valuables.  Contacts, dentures or bridgework may not be worn into surgery.  Leave your suitcase in the car.  After surgery it may be brought to your room.  For patients admitted to the hospital, discharge time will be determined by your treatment team.  Patients discharged the day of surgery will not be allowed to drive home and must have someone with them for 24 hours.    Special instructions:   DO NOT smoke tobacco or vape for 24 hours before your procedure.  Please read over the following fact sheets that you were given. Pain Booklet, Coughing and Deep Breathing, Blood Transfusion Information, Surgical Site Infection Prevention, Anesthesia Post-op Instructions, and Care and Recovery After Surgery      Vaginal Hysterectomy, Care After The following information offers guidance on how to care for yourself after your procedure. Your health care provider may also give you more specific instructions. If you have problems or questions, contact your health care  provider. What can I expect after the procedure? After the procedure, it is common to have: Pain in the lower abdomen and vagina. Vaginal bleeding and discharge for up to 1 week. You will need to use a sanitary pad after this procedure. Difficulty having a bowel movement (constipation). Temporary problems emptying the bladder. Tiredness (fatigue). Poor appetite. Less interest in sex. Feelings of sadness or other emotions. If your ovaries were also removed, it is also common to have symptoms of menopause, such as hot flashes, night sweats, and lack of sleep (insomnia). Follow these instructions at home: Medicines  Take over-the-counter and prescription medicines only as told by your health care provider. Ask your health care provider if the medicine prescribed to you: Requires you to avoid driving or using machinery. Can cause constipation. You may need to take these actions to prevent or treat constipation: Drink enough fluid to keep your urine pale yellow. Take over-the-counter or prescription medicines. Eat foods that are high in fiber, such as beans, whole grains, and fresh fruits and vegetables. Limit foods that are high in fat and processed sugars, such as fried or sweet foods. Activity  Rest as told by your health care provider. Return to your normal activities as told by your health care provider. Ask your health care provider what activities are safe for you Avoid sitting for a long time without moving. Get up to take short walks every 1-2 hours.  This is important to improve blood flow and breathing. Ask for help if you feel weak or unsteady. Try to have someone home with you for 1-2 weeks to help you with everyday chores. Do not lift anything that is heavier than 10 lb (4.5 kg), or the limit that you are told, until your health care provider says that it is safe. If you were given a sedative during the procedure, it can affect you for several hours. Do not drive or operate  machinery until your health care provider says that it is safe. Lifestyle Do not use any products that contain nicotine or tobacco. These products include cigarettes, chewing tobacco, and vaping devices, such as e-cigarettes. These can delay healing after surgery. If you need help quitting, ask your health care provider. Do not drink alcohol until your health care provider approves. General instructions Do not douche, use tampons, or have sex for at least 6 weeks, or as told by your health care provider. If you struggle with physical or emotional changes after your procedure, speak with your health care provider or a therapist. The stitches inside your vagina will dissolve over time and do not need to be taken out. Do not take baths, swim, or use a hot tub until your health care provider approves. You may only be allowed to take showers for 2-3 weeks Wear compression stockings as told by your health care provider. These stockings help to prevent blood clots and reduce swelling in your legs. Keep all follow-up visits. This is important. Contact a health care provider if: Your pain medicine is not helping. You have a fever. You have nausea or vomiting that does not go away. You feel dizzy. You have blood, pus, or a bad-smelling discharge from your vagina more than 1 week after the procedure. You continue to have trouble urinating 3-5 days after the procedure. Get help right away if: You have severe pain in your abdomen or back. You faint. You have heavy vaginal bleeding and blood clots, soaking through a sanitary pad in less than 1 hour. You have chest pain or shortness of breath. You have pain, swelling, or redness in your leg. These symptoms may represent a serious problem that is an emergency. Do not wait to see if the symptoms will go away. Get medical help right away. Call your local emergency services (911 in the [place] Do not drive yourself to the hospital. Summary After the procedure,  it is common to have pain, vaginal bleeding, constipation, temporary problems emptying your bladder, and feelings of sadness or other emotions. Take over-the-counter and prescription medicines only as told by your health care provider. Rest as told by your health care provider. Return to your normal activities as told by your health care provider. Contact a health care provider if your pain medicine is not helping, or you have a fever, dizziness, or trouble urinating several days after the procedure. Get help right away if you have severe pain in your abdomen or back, or if you faint, have heavy bleeding, or have chest pain or shortness of breath. This information is not intended to replace advice given to you by your health care provider. Make sure you discuss any questions you have with your health care provider. Document Revised: 01/14/2022 Document Reviewed: 07/05/2020 Elsevier Patient Education  2023 [employer] General Anesthesia, Adult, Care After The following information offers guidance on how to care for yourself after your procedure. Your health care provider may also give you more specific instructions.  If you have problems or questions, contact your health care provider. What can I expect after the procedure? After the procedure, it is common for people to: Have pain or discomfort at the IV site. Have nausea or vomiting. Have a sore throat or hoarseness. Have trouble concentrating. Feel cold or chills. Feel weak, sleepy, or tired (fatigue). Have soreness and body aches. These can affect parts of the body that were not involved in surgery. Follow these instructions at home: For the time period you were told by your health care provider:  Rest. Do not participate in activities where you could fall or become injured. Do not drive or use machinery. Do not drink alcohol. Do not take sleeping pills or medicines that cause drowsiness. Do not make important decisions or sign legal  documents. Do not take care of children on your own. General instructions Drink enough fluid to keep your urine pale yellow. If you have sleep apnea, surgery and certain medicines can increase your risk for breathing problems. Follow instructions from your health care provider about wearing your sleep device: Anytime you are sleeping, including during daytime naps. While taking prescription pain medicines, sleeping medicines, or medicines that make you drowsy. Return to your normal activities as told by your health care provider. Ask your health care provider what activities are safe for you. Take over-the-counter and prescription medicines only as told by your health care provider. Do not use any products that contain nicotine or tobacco. These products include cigarettes, chewing tobacco, and vaping devices, such as e-cigarettes. These can delay incision healing after surgery. If you need help quitting, ask your health care provider. Contact a health care provider if: You have nausea or vomiting that does not get better with medicine. You vomit every time you eat or drink. You have pain that does not get better with medicine. You cannot urinate or have bloody urine. You develop a skin rash. You have a fever. Get help right away if: You have trouble breathing. You have chest pain. You vomit blood. These symptoms may be an emergency. Get help right away. Call 911. Do not wait to see if the symptoms will go away. Do not drive yourself to the hospital. Summary After the procedure, it is common to have a sore throat, hoarseness, nausea, vomiting, or to feel weak, sleepy, or fatigue. For the time period you were told by your health care provider, do not drive or use machinery. Get help right away if you have difficulty breathing, have chest pain, or vomit blood. These symptoms may be an emergency. This information is not intended to replace advice given to you by your health care provider.  Make sure you discuss any questions you have with your health care provider. Document Revised: 01/30/2022 Document Reviewed: 01/30/2022 Elsevier Patient Education  [place]. How to Use Chlorhexidine Before Surgery Chlorhexidine gluconate (CHG) is a germ-killing (antiseptic) solution that is used to clean the skin. It can get rid of the bacteria that normally live on the skin and can keep them away for about 24 hours. To clean your skin with CHG, you may be given: A CHG solution to use in the shower or as part of a sponge bath. A prepackaged cloth that contains CHG. Cleaning your skin with CHG may help lower the risk for infection: While you are staying in the intensive care unit of the hospital. If you have a vascular access, such as a central line, to provide short-term or long-term access to your  veins. If you have a catheter to drain urine from your bladder. If you are on a ventilator. A ventilator is a machine that helps you breathe by moving air in and out of your lungs. After surgery. What are the risks? Risks of using CHG include: A skin reaction. Hearing loss, if CHG gets in your ears and you have a perforated eardrum. Eye injury, if CHG gets in your eyes and is not rinsed out. The CHG product catching fire. Make sure that you avoid smoking and flames after applying CHG to your skin. Do not use CHG: If you have a chlorhexidine allergy or have previously reacted to chlorhexidine. On babies younger than 5 months of age. How to use CHG solution Use CHG only as told by your health care provider, and follow the instructions on the label. Use the full amount of CHG as directed. Usually, this is one bottle. During a shower Follow these steps when using CHG solution during a shower (unless your health care provider gives you different instructions): Start the shower. Use your normal soap and shampoo to wash your face and hair. Turn off the shower or move out of the shower  stream. Pour the CHG onto a clean washcloth. Do not use any type of brush or rough-edged sponge. Starting at your neck, lather your body down to your toes. Make sure you follow these instructions: If you will be having surgery, pay special attention to the part of your body where you will be having surgery. Scrub this area for at least 1 minute. Do not use CHG on your head or face. If the solution gets into your ears or eyes, rinse them well with water. Avoid your genital area. Avoid any areas of skin that have broken skin, cuts, or scrapes. Scrub your back and under your arms. Make sure to wash skin folds. Let the lather sit on your skin for 1-2 minutes or as long as told by your health care provider. Thoroughly rinse your entire body in the shower. Make sure that all body creases and crevices are rinsed well. Dry off with a clean towel. Do not put any substances on your body afterward--such as powder, lotion, or perfume--unless you are told to do so by your health care provider. Only use lotions that are recommended by the manufacturer. Put on clean clothes or pajamas. If it is the night before your surgery, sleep in clean sheets.  During a sponge bath Follow these steps when using CHG solution during a sponge bath (unless your health care provider gives you different instructions): Use your normal soap and shampoo to wash your face and hair. Pour the CHG onto a clean washcloth. Starting at your neck, lather your body down to your toes. Make sure you follow these instructions: If you will be having surgery, pay special attention to the part of your body where you will be having surgery. Scrub this area for at least 1 minute. Do not use CHG on your head or face. If the solution gets into your ears or eyes, rinse them well with water. Avoid your genital area. Avoid any areas of skin that have broken skin, cuts, or scrapes. Scrub your back and under your arms. Make sure to wash skin folds. Let  the lather sit on your skin for 1-2 minutes or as long as told by your health care provider. Using a different clean, wet washcloth, thoroughly rinse your entire body. Make sure that all body creases and crevices are rinsed  well. Dry off with a clean towel. Do not put any substances on your body afterward--such as powder, lotion, or perfume--unless you are told to do so by your health care provider. Only use lotions that are recommended by the manufacturer. Put on clean clothes or pajamas. If it is the night before your surgery, sleep in clean sheets. How to use CHG prepackaged cloths Only use CHG cloths as told by your health care provider, and follow the instructions on the label. Use the CHG cloth on clean, dry skin. Do not use the CHG cloth on your head or face unless your health care provider tells you to. When washing with the CHG cloth: Avoid your genital area. Avoid any areas of skin that have broken skin, cuts, or scrapes. Before surgery Follow these steps when using a CHG cloth to clean before surgery (unless your health care provider gives you different instructions): Using the CHG cloth, vigorously scrub the part of your body where you will be having surgery. Scrub using a back-and-forth motion for 3 minutes. The area on your body should be completely wet with CHG when you are done scrubbing. Do not rinse. Discard the cloth and let the area air-dry. Do not put any substances on the area afterward, such as powder, lotion, or perfume. Put on clean clothes or pajamas. If it is the night before your surgery, sleep in clean sheets.  For general bathing Follow these steps when using CHG cloths for general bathing (unless your health care provider gives you different instructions). Use a separate CHG cloth for each area of your body. Make sure you wash between any folds of skin and between your fingers and toes. Wash your body in the following order, switching to a new cloth after each  step: The front of your neck, shoulders, and chest. Both of your arms, under your arms, and your hands. Your stomach and groin area, avoiding the genitals. Your right leg and foot. Your left leg and foot. The back of your neck, your back, and your buttocks. Do not rinse. Discard the cloth and let the area air-dry. Do not put any substances on your body afterward--such as powder, lotion, or perfume--unless you are told to do so by your health care provider. Only use lotions that are recommended by the manufacturer. Put on clean clothes or pajamas. Contact a health care provider if: Your skin gets irritated after scrubbing. You have questions about using your solution or cloth. You swallow any chlorhexidine. Call your local poison control center [phone] in the [place] Get help right away if: Your eyes itch badly, or they become very red or swollen. Your skin itches badly and is red or swollen. Your hearing changes. You have trouble seeing. You have swelling or tingling in your mouth or throat. You have trouble breathing. These symptoms may represent a serious problem that is an emergency. Do not wait to see if the symptoms will go away. Get medical help right away. Call your local emergency services (911 in the [place] Do not drive yourself to the hospital. Summary Chlorhexidine gluconate (CHG) is a germ-killing (antiseptic) solution that is used to clean the skin. Cleaning your skin with CHG may help to lower your risk for infection. You may be given CHG to use for bathing. It may be in a bottle or in a prepackaged cloth to use on your skin. Carefully follow your health care provider's instructions and the instructions on the product label. Do not use CHG if you  have a chlorhexidine allergy. Contact your health care provider if your skin gets irritated after scrubbing. This information is not intended to replace advice given to you by your health care provider. Make sure you discuss  any questions you have with your health care provider. Document Revised: 03/02/2022 Document Reviewed: 01/13/2021 Elsevier Patient Education  2023 [employer].

## 2022-09-17 ENCOUNTER — Encounter (HOSPITAL_COMMUNITY)
Admission: RE | Admit: 2022-09-17 | Discharge: 2022-09-17 | Disposition: A | Discharge status: Home / Self Care | Payer: Medicaid Other | Source: Ambulatory Visit | Attending: Obstetrics & Gynecology | Admitting: Obstetrics & Gynecology

## 2022-09-17 VITALS — BP 118/81 | HR 95 | Resp 18 | Ht 66.0 in | Wt >= 6400 oz

## 2022-09-17 DIAGNOSIS — Z01810 Encounter for preprocedural cardiovascular examination: Secondary | ICD-10-CM | POA: Diagnosis not present

## 2022-09-17 DIAGNOSIS — R7303 Prediabetes: Secondary | ICD-10-CM | POA: Diagnosis not present

## 2022-09-17 DIAGNOSIS — N939 Abnormal uterine and vaginal bleeding, unspecified: Secondary | ICD-10-CM | POA: Insufficient documentation

## 2022-09-17 DIAGNOSIS — Z01818 Encounter for other preprocedural examination: Secondary | ICD-10-CM | POA: Diagnosis present

## 2022-09-17 DIAGNOSIS — O119 Pre-existing hypertension with pre-eclampsia, unspecified trimester: Secondary | ICD-10-CM

## 2022-09-17 LAB — BASIC METABOLIC PANEL
Anion gap: 11 (ref 5–15)
BUN: 11 mg/dL (ref 6–20)
CO2: 17 mmol/L — ABNORMAL LOW (ref 22–32)
Calcium: 9.2 mg/dL (ref 8.9–10.3)
Chloride: 108 mmol/L (ref 98–111)
Creatinine, Ser: 1 mg/dL (ref 0.44–1.00)
GFR, Estimated: >60 mL/min (ref >60)
Glucose, Bld: 203 mg/dL — ABNORMAL HIGH (ref 70–99)
Potassium: 3.7 mmol/L (ref 3.5–5.1)
Sodium: 136 mmol/L (ref 135–145)

## 2022-09-17 LAB — CBC
HCT: 41.9 % (ref 36.0–46.0)
Hemoglobin: 14.1 g/dL (ref 12.0–15.0)
MCH: 30.4 pg (ref 26.0–34.0)
MCHC: 33.7 g/dL (ref 30.0–36.0)
MCV: 90.3 fL (ref 80.0–100.0)
Platelets: 240 10*3/uL (ref 150–400)
RBC: 4.64 MIL/uL (ref 3.87–5.11)
RDW: 15.8 % — ABNORMAL HIGH (ref 11.5–15.5)
WBC: 9.7 10*3/uL (ref 4.0–10.5)
nRBC: 0 % (ref 0.0–0.2)

## 2022-09-17 LAB — TYPE AND SCREEN
ABO/RH(D): O POS
Antibody Screen: NEGATIVE

## 2022-09-17 LAB — HEMOGLOBIN A1C
Hgb A1c MFr Bld: 7.4 % — ABNORMAL HIGH (ref 4.8–5.6)
Mean Plasma Glucose: 165.68 mg/dL

## 2022-09-19 NOTE — H&P (Signed)
[facility]stetrics and Gynecology Attending History and Physical  [name] is a 36 y.o. [id number] who presents for scheduled vaginal hysterectomy due to abnormal uterine bleeding.  In review, this has been an ongoing issue.  She has tried both medication as well as conservative treatment with D&C with minimal improvement.   Currently on Slynd- daily spotting- some days heavier than others, but typically using at least 7-10 pads per day.  Desires to proceed with permanent surgical intervention.  Denies any abnormal vaginal discharge, fevers, chills, sweats, dysuria, nausea, vomiting, other GI or GU symptoms or other general symptoms.  Past medical history: -Depression -Fattyl Liver GERD Herpes Hypertension Anxiety Diabetes   Past Surgical History:  Procedure Laterality Date   CHOLECYSTECTOMY N/A 08/14/2021   Procedure: LAPAROSCOPIC CHOLECYSTECTOMY WITH INTRAOPERATIVE CHOLANGIOGRAM;  Surgeon: Armandina Gemma, MD;  Location: [facility];  Service: General;  Laterality: N/A;   COLONOSCOPY WITH ESOPHAGOGASTRODUODENOSCOPY (EGD) N/A 02/24/2013   XLK:[id number] RECTAL BLEEDING DUE TO Moderate sized internal hemorrhoids, EGD: erosive esophagitis due to uncontrolled reflux, moderate erosive gastritis, benign path   ESOPHAGOGASTRODUODENOSCOPY  01/19/2012   YQI:HKVQQV,ZDGLOVFI IN THE ANTRUM/HIATAL HERNIA/   ESOPHAGOGASTRODUODENOSCOPY N/A 07/31/2016   Procedure: ESOPHAGOGASTRODUODENOSCOPY (EGD);  Surgeon: Danie Binder, MD;  Location: [facility];  Service: Endoscopy;  Laterality: N/A;  10:15 AM   HYSTEROSCOPY WITH D & C N/A 03/17/2022   Procedure: DILATATION AND CURETTAGE /HYSTEROSCOPY;  Surgeon: Janyth Pupa, DO;  Location: [facility];  Service: Gynecology;  Laterality: N/A;   POLYPECTOMY N/A 03/17/2022   Procedure: POLYPECTOMY;  Surgeon: Janyth Pupa, DO;  Location: [facility];  Service: Gynecology;  Laterality: N/A;   TUBAL LIGATION Bilateral 12/08/2020   Procedure: POST PARTUM TUBAL LIGATION;   Surgeon: Donnamae Jude, MD;  Location: [facility];  Service: Gynecology;  Laterality: Bilateral;   WISDOM TOOTH EXTRACTION     OB History  Gravida Para Term Preterm AB Living  2 2 2     2   SAB IAB Ectopic Multiple Live Births        0 2    # Outcome Date GA Lbr Len/2nd Weight Sex Delivery Anes PTL Lv  2 Term 12/07/20 [redacted]w[redacted]d 09:13 / 00:47 2690 g F Vag-Spont EPI  LIV  1 Term 09/15/08 [redacted]w[redacted]d  2948 g M Vag-Spont EPI N LIV  Patient denies any other pertinent gynecologic issues.  No current facility-administered medications on file prior to encounter.   Current Outpatient Medications on File Prior to Encounter  Medication Sig Dispense Refill   DULoxetine (CYMBALTA) 60 MG capsule Take 60 mg by mouth 2 (two) times daily.     hydrOXYzine (ATARAX/VISTARIL) 25 MG tablet Take 25 mg by mouth every 8 (eight) hours as needed for anxiety.     oxyCODONE-acetaminophen (PERCOCET) 10-325 MG tablet Take 1 tablet by mouth every 8 (eight) hours as needed for severe pain.     pantoprazole (PROTONIX) 40 MG tablet TAKE 1 TABLET (40 MG TOTAL) BY MOUTH 2 (TWO) TIMES DAILY BEFORE A MEAL. (Patient taking differently: Take 80 mg by mouth daily.) 60 tablet 10   pregabalin (LYRICA) 150 MG capsule Take 150 mg by mouth 3 (three) times daily.     propranolol (INDERAL) 20 MG tablet Take 40 mg by mouth daily.     QULIPTA 60 MG TABS Take 60 mg by mouth daily.     rizatriptan (MAXALT) 10 MG tablet Take 10 mg by mouth as needed for migraine. May repeat in 2 hours if needed  tiZANidine (ZANAFLEX) 2 MG tablet Take 2 mg by mouth 3 (three) times daily as needed for muscle spasms.     Topiramate ER (TROKENDI XR) 200 MG CP24 Take 200 mg by mouth daily.     traMADol (ULTRAM) 50 MG tablet Take 100 mg by mouth every 8 (eight) hours as needed for moderate pain.     Allergies  Allergen Reactions   Nucynta [Tapentadol] Itching    Pt unaware of this reaction    Tape Itching   Naproxen Other (See Comments)    Was told by doctor not to  take medication due to it messing up her esophagus .    Social History:   reports that she quit smoking about 11 years ago. Her smoking use included cigarettes. She has never used smokeless tobacco. She reports that she does not drink alcohol and does not use drugs. Family History  Problem Relation Age of Onset   Kidney disease Mother    GER disease Mother    Other Mother        diverticulitis; hernia   Diabetes Mother        borderline   Anxiety disorder Mother    Depression Mother    Hyperlipidemia Mother    Osteoporosis Mother    Diabetes Maternal Grandmother    COPD Maternal Grandmother    Diabetes Maternal Grandfather    Congestive Heart Failure Maternal Grandfather    Headache Son    ADD / ADHD Son    Diabetes Other    Colon cancer Neg Hx    Colon polyps Neg Hx    Gastric cancer Neg Hx    Esophageal cancer Neg Hx     Review of Systems: Pertinent items noted in HPI and remainder of comprehensive ROS otherwise negative.  PHYSICAL EXAM: Last menstrual period 08/25/2022, not currently breastfeeding. *** CONSTITUTIONAL: Well-developed, well-nourished female in no acute distress.  SKIN: Skin is warm and dry. No rash noted. Not diaphoretic. No erythema. No pallor. NEUROLOGIC: Alert and oriented to person, place, and time. Normal reflexes, muscle tone coordination. No cranial nerve deficit noted. PSYCHIATRIC: Normal mood and affect. Normal behavior. Normal judgment and thought content. CARDIOVASCULAR: Normal heart rate noted, regular rhythm RESPIRATORY: Effort and breath sounds normal, no problems with respiration noted ABDOMEN: Soft, nontender, nondistended. PELVIC: deferred MUSCULOSKELETAL: no calf tenderness bilaterally EXT: no edema bilaterally, normal pulses  Labs: Results for orders placed or performed during the hospital encounter of 09/17/22 (from the past 336 hour(s))  Hemoglobin A1c   Collection Time: 09/17/22  9:37 AM  Result Value Ref Range   Hgb A1c MFr Bld  7.4 (H) 4.8 - 5.6 %   Mean Plasma Glucose 165.68 mg/dL  CBC   Collection Time: 09/17/22  9:37 AM  Result Value Ref Range   WBC 9.7 4.0 - 10.5 K/uL   RBC 4.64 3.87 - 5.11 MIL/uL   Hemoglobin 14.1 12.0 - 15.0 g/dL   HCT 41.9 36.0 - 46.0 %   MCV 90.3 80.0 - 100.0 fL   MCH 30.4 26.0 - 34.0 pg   MCHC 33.7 30.0 - 36.0 g/dL   RDW 15.8 (H) 11.5 - 15.5 %   Platelets 240 150 - 400 K/uL   nRBC 0.0 0.0 - 0.2 %  Basic metabolic panel   Collection Time: 09/17/22  9:37 AM  Result Value Ref Range   Sodium 136 135 - 145 mmol/L   Potassium 3.7 3.5 - 5.1 mmol/L   Chloride 108 98 - 111 mmol/L  CO2 17 (L) 22 - 32 mmol/L   Glucose, Bld 203 (H) 70 - 99 mg/dL   BUN 11 6 - 20 mg/dL   Creatinine, Ser 1.00 0.44 - 1.00 mg/dL   Calcium 9.2 8.9 - 10.3 mg/dL   GFR, Estimated >60 >60 mL/min   Anion gap 11 5 - 15  Type and screen   Collection Time: 09/17/22  9:37 AM  Result Value Ref Range   ABO/RH(D) O POS    Antibody Screen NEG    Sample Expiration 10/01/2022,2359    Extend sample reason      NO TRANSFUSIONS OR PREGNANCY IN THE PAST 3 MONTHS Performed at [facility], [address], [place],  [address]     Imaging Studies:   Last [place] 03/2022: 8.1 x 4.1 x 4.7 cm = volume: 80.3 mL. There is inhomogeneous echogenicity without discrete nodules. Uterus is retroverted.  Normal ovaries bilaterally  Assessment: Abnormal uterine bleeding Morbid obesity Diabetes HTN   Plan: Vaginal hysterectomy -Ancef 2g IV -NPO -LR @ 125cc/hr -SCDs to OR -Risk/benefits and alternatives reviewed with the patient including but not limited to risk of bleeding, infection and injury to surrounding organs.  Questions and concerns were addressed and pt desires to proceed  Janyth Pupa, DO Attending [place], [facility] for [facility], [place]

## 2022-09-21 MED ORDER — SOD CITRATE-CITRIC ACID 500-334 MG/5ML PO SOLN
30.0000 mL | ORAL | Status: AC
  Administered 2022-09-22: 30 mL via ORAL
  Filled 2022-09-21: qty 30

## 2022-09-22 ENCOUNTER — Ambulatory Visit (HOSPITAL_COMMUNITY): Payer: Medicaid Other | Admitting: Anesthesiology

## 2022-09-22 ENCOUNTER — Ambulatory Visit (HOSPITAL_COMMUNITY)
Admission: RE | Admit: 2022-09-22 | Discharge: 2022-09-22 | Disposition: A | Discharge status: Home / Self Care | Payer: Medicaid Other | Source: Ambulatory Visit | Attending: Obstetrics & Gynecology | Admitting: Obstetrics & Gynecology

## 2022-09-22 ENCOUNTER — Other Ambulatory Visit: Payer: Self-pay | Admitting: Obstetrics & Gynecology

## 2022-09-22 ENCOUNTER — Other Ambulatory Visit: Payer: Self-pay

## 2022-09-22 ENCOUNTER — Ambulatory Visit (HOSPITAL_BASED_OUTPATIENT_CLINIC_OR_DEPARTMENT_OTHER): Payer: Medicaid Other | Admitting: Anesthesiology

## 2022-09-22 ENCOUNTER — Encounter (HOSPITAL_COMMUNITY)
Admission: RE | Disposition: A | Discharge status: Home / Self Care | Payer: Self-pay | Source: Ambulatory Visit | Attending: Obstetrics & Gynecology

## 2022-09-22 DIAGNOSIS — N939 Abnormal uterine and vaginal bleeding, unspecified: Secondary | ICD-10-CM

## 2022-09-22 DIAGNOSIS — K219 Gastro-esophageal reflux disease without esophagitis: Secondary | ICD-10-CM | POA: Diagnosis not present

## 2022-09-22 DIAGNOSIS — F418 Other specified anxiety disorders: Secondary | ICD-10-CM | POA: Diagnosis not present

## 2022-09-22 DIAGNOSIS — I10 Essential (primary) hypertension: Secondary | ICD-10-CM

## 2022-09-22 DIAGNOSIS — Z6841 Body Mass Index (BMI) 40.0 and over, adult: Secondary | ICD-10-CM | POA: Insufficient documentation

## 2022-09-22 DIAGNOSIS — Z87891 Personal history of nicotine dependence: Secondary | ICD-10-CM | POA: Diagnosis not present

## 2022-09-22 HISTORY — PX: VAGINAL HYSTERECTOMY: SHX2639

## 2022-09-22 LAB — POCT PREGNANCY, URINE: Preg Test, Ur: NEGATIVE

## 2022-09-22 LAB — GLUCOSE, CAPILLARY: Glucose-Capillary: 149 mg/dL — ABNORMAL HIGH (ref 70–99)

## 2022-09-22 SURGERY — HYSTERECTOMY, VAGINAL
Anesthesia: General | Site: Vagina

## 2022-09-22 MED ORDER — KETOROLAC TROMETHAMINE 15 MG/ML IJ SOLN
30.0000 mg | INTRAMUSCULAR | Status: AC
  Administered 2022-09-22: 30 mg via INTRAVENOUS

## 2022-09-22 MED ORDER — KETAMINE HCL 50 MG/5ML IJ SOSY
PREFILLED_SYRINGE | INTRAMUSCULAR | Status: AC
  Filled 2022-09-22: qty 5

## 2022-09-22 MED ORDER — PROPOFOL 10 MG/ML IV BOLUS
INTRAVENOUS | Status: DC | PRN
  Administered 2022-09-22: 200 mg via INTRAVENOUS

## 2022-09-22 MED ORDER — FENTANYL CITRATE (PF) 250 MCG/5ML IJ SOLN
INTRAMUSCULAR | Status: AC
  Filled 2022-09-22: qty 5

## 2022-09-22 MED ORDER — ONDANSETRON 4 MG PO TBDP: 4.0000 mg | ORAL_TABLET | Freq: Three times a day (TID) | ORAL | 0 refills | Status: DC | PRN

## 2022-09-22 MED ORDER — 0.9 % SODIUM CHLORIDE (POUR BTL) OPTIME
TOPICAL | Status: DC | PRN
  Administered 2022-09-22: 1000 mL

## 2022-09-22 MED ORDER — ONDANSETRON HCL 4 MG/2ML IJ SOLN
INTRAMUSCULAR | Status: AC
  Filled 2022-09-22: qty 2

## 2022-09-22 MED ORDER — ONDANSETRON HCL 4 MG/2ML IJ SOLN
INTRAMUSCULAR | Status: DC | PRN
  Administered 2022-09-22: 4 mg via INTRAVENOUS

## 2022-09-22 MED ORDER — KETOROLAC TROMETHAMINE 10 MG PO TABS: 10.0000 mg | ORAL_TABLET | Freq: Three times a day (TID) | ORAL | 0 refills | Status: AC

## 2022-09-22 MED ORDER — LACTATED RINGERS IV SOLN: INTRAVENOUS | Status: DC

## 2022-09-22 MED ORDER — FENTANYL CITRATE (PF) 250 MCG/5ML IJ SOLN
INTRAMUSCULAR | Status: DC | PRN
  Administered 2022-09-22 (×5): 50 ug via INTRAVENOUS

## 2022-09-22 MED ORDER — ACETAMINOPHEN 325 MG PO TABS: 650.0000 mg | ORAL_TABLET | Freq: Four times a day (QID) | ORAL | Status: DC | PRN

## 2022-09-22 MED ORDER — LABETALOL HCL 5 MG/ML IV SOLN
INTRAVENOUS | Status: AC
  Filled 2022-09-22: qty 4

## 2022-09-22 MED ORDER — ONDANSETRON HCL 4 MG/2ML IJ SOLN: 4.0000 mg | Freq: Once | INTRAMUSCULAR | Status: DC | PRN

## 2022-09-22 MED ORDER — DEXAMETHASONE SODIUM PHOSPHATE 10 MG/ML IJ SOLN
INTRAMUSCULAR | Status: AC
  Filled 2022-09-22: qty 1

## 2022-09-22 MED ORDER — OXYCODONE HCL 5 MG PO TABS: 5.0000 mg | ORAL_TABLET | Freq: Four times a day (QID) | ORAL | 0 refills | Status: AC | PRN

## 2022-09-22 MED ORDER — LABETALOL HCL 5 MG/ML IV SOLN
INTRAVENOUS | Status: DC | PRN
  Administered 2022-09-22 (×2): 5 mg via INTRAVENOUS

## 2022-09-22 MED ORDER — ORAL CARE MOUTH RINSE: 15.0000 mL | Freq: Once | OROMUCOSAL | Status: DC

## 2022-09-22 MED ORDER — POVIDONE-IODINE 10 % EX SWAB: 2.0000 | Freq: Once | CUTANEOUS | Status: DC

## 2022-09-22 MED ORDER — CEFAZOLIN IN SODIUM CHLORIDE 3-0.9 GM/100ML-% IV SOLN
3.0000 g | INTRAVENOUS | Status: AC
  Administered 2022-09-22: 3 g via INTRAVENOUS
  Filled 2022-09-22: qty 100

## 2022-09-22 MED ORDER — BUPIVACAINE HCL (PF) 0.25 % IJ SOLN
INTRAMUSCULAR | Status: AC
  Filled 2022-09-22: qty 30

## 2022-09-22 MED ORDER — ACETAMINOPHEN 10 MG/ML IV SOLN
1000.0000 mg | Freq: Once | INTRAVENOUS | Status: AC
  Administered 2022-09-22: 1000 mg via INTRAVENOUS

## 2022-09-22 MED ORDER — LIDOCAINE 2% (20 MG/ML) 5 ML SYRINGE
INTRAMUSCULAR | Status: DC | PRN
  Administered 2022-09-22: 100 mg via INTRAVENOUS

## 2022-09-22 MED ORDER — SCOPOLAMINE 1 MG/3DAYS TD PT72: 1.0000 | MEDICATED_PATCH | Freq: Once | TRANSDERMAL | Status: DC

## 2022-09-22 MED ORDER — LIDOCAINE HCL (PF) 2 % IJ SOLN
INTRAMUSCULAR | Status: AC
  Filled 2022-09-22: qty 5

## 2022-09-22 MED ORDER — SUCCINYLCHOLINE CHLORIDE 200 MG/10ML IV SOSY
PREFILLED_SYRINGE | INTRAVENOUS | Status: DC | PRN
  Administered 2022-09-22: 180 mg via INTRAVENOUS

## 2022-09-22 MED ORDER — PHENYLEPHRINE 80 MCG/ML (10ML) SYRINGE FOR IV PUSH (FOR BLOOD PRESSURE SUPPORT)
PREFILLED_SYRINGE | INTRAVENOUS | Status: AC
  Filled 2022-09-22: qty 10

## 2022-09-22 MED ORDER — PROPOFOL 500 MG/50ML IV EMUL
INTRAVENOUS | Status: AC
  Filled 2022-09-22: qty 50

## 2022-09-22 MED ORDER — KETAMINE HCL-SODIUM CHLORIDE 100-0.9 MG/10ML-% IV SOSY
PREFILLED_SYRINGE | INTRAVENOUS | Status: DC | PRN
  Administered 2022-09-22: 10 mg via INTRAVENOUS
  Administered 2022-09-22: 40 mg via INTRAVENOUS

## 2022-09-22 MED ORDER — SCOPOLAMINE 1 MG/3DAYS TD PT72
MEDICATED_PATCH | TRANSDERMAL | Status: AC
  Administered 2022-09-22: 1.5 mg
  Filled 2022-09-22: qty 1

## 2022-09-22 MED ORDER — KETOROLAC TROMETHAMINE 30 MG/ML IJ SOLN
INTRAMUSCULAR | Status: AC
  Filled 2022-09-22: qty 1

## 2022-09-22 MED ORDER — DEXAMETHASONE SODIUM PHOSPHATE 10 MG/ML IJ SOLN
INTRAMUSCULAR | Status: DC | PRN
  Administered 2022-09-22: 10 mg via INTRAVENOUS

## 2022-09-22 MED ORDER — IBUPROFEN 600 MG PO TABS: 600.0000 mg | ORAL_TABLET | Freq: Four times a day (QID) | ORAL | 0 refills | Status: DC | PRN

## 2022-09-22 MED ORDER — PHENYLEPHRINE 80 MCG/ML (10ML) SYRINGE FOR IV PUSH (FOR BLOOD PRESSURE SUPPORT)
PREFILLED_SYRINGE | INTRAVENOUS | Status: DC | PRN
  Administered 2022-09-22: 80 ug via INTRAVENOUS
  Administered 2022-09-22: 240 ug via INTRAVENOUS

## 2022-09-22 MED ORDER — DOCUSATE SODIUM 100 MG PO CAPS: 100.0000 mg | ORAL_CAPSULE | Freq: Two times a day (BID) | ORAL | 0 refills | Status: AC

## 2022-09-22 MED ORDER — HYDROMORPHONE HCL 1 MG/ML IJ SOLN
0.2500 mg | INTRAMUSCULAR | Status: DC | PRN
  Administered 2022-09-22 (×2): 0.5 mg via INTRAVENOUS
  Filled 2022-09-22 (×2): qty 0.5

## 2022-09-22 MED ORDER — KETOROLAC TROMETHAMINE 30 MG/ML IJ SOLN
INTRAMUSCULAR | Status: AC
  Administered 2022-09-22: 30 mg
  Filled 2022-09-22: qty 1

## 2022-09-22 MED ORDER — SUGAMMADEX SODIUM 500 MG/5ML IV SOLN
INTRAVENOUS | Status: AC
  Filled 2022-09-22: qty 5

## 2022-09-22 MED ORDER — ROCURONIUM BROMIDE 10 MG/ML (PF) SYRINGE
PREFILLED_SYRINGE | INTRAVENOUS | Status: AC
  Filled 2022-09-22: qty 10

## 2022-09-22 MED ORDER — MEPERIDINE HCL 50 MG/ML IJ SOLN: 6.2500 mg | INTRAMUSCULAR | Status: DC | PRN

## 2022-09-22 MED ORDER — CHLORHEXIDINE GLUCONATE 0.12 % MT SOLN
15.0000 mL | Freq: Once | OROMUCOSAL | Status: DC
  Filled 2022-09-22: qty 15

## 2022-09-22 MED ORDER — SUGAMMADEX SODIUM 200 MG/2ML IV SOLN
INTRAVENOUS | Status: DC | PRN
  Administered 2022-09-22: 400 mg via INTRAVENOUS

## 2022-09-22 MED ORDER — CIPROFLOXACIN HCL 500 MG PO TABS: 500.0000 mg | ORAL_TABLET | Freq: Two times a day (BID) | ORAL | 0 refills | Status: AC

## 2022-09-22 MED ORDER — MIDAZOLAM HCL 2 MG/2ML IJ SOLN
INTRAMUSCULAR | Status: AC
  Filled 2022-09-22: qty 2

## 2022-09-22 MED ORDER — MIDAZOLAM HCL 2 MG/2ML IJ SOLN
INTRAMUSCULAR | Status: DC | PRN
  Administered 2022-09-22: 2 mg via INTRAVENOUS

## 2022-09-22 MED ORDER — ROCURONIUM BROMIDE 10 MG/ML (PF) SYRINGE
PREFILLED_SYRINGE | INTRAVENOUS | Status: DC | PRN
  Administered 2022-09-22: 40 mg via INTRAVENOUS

## 2022-09-22 MED ORDER — BUPIVACAINE-EPINEPHRINE 0.25% -1:200000 IJ SOLN
INTRAMUSCULAR | Status: DC | PRN
  Administered 2022-09-22: 29 mL

## 2022-09-22 SURGICAL SUPPLY — 33 items
APPLIER CLIP 13 LRG OPEN (CLIP) ×1
APR CLP LRG 13 20 CLIP (CLIP) ×1
CLIP APPLIE 13 LRG OPEN (CLIP) IMPLANT
CLOTH BEACON ORANGE TIMEOUT ST (SAFETY) ×2 IMPLANT
COVER LIGHT HANDLE STERIS (MISCELLANEOUS) ×4 IMPLANT
DRAPE HALF SHEET 40X57 (DRAPES) ×2 IMPLANT
DRAPE STERI URO 9X17 APER PCH (DRAPES) ×2 IMPLANT
GAUZE 4X4 16PLY ~~LOC~~+RFID DBL (SPONGE) ×4 IMPLANT
GLOVE BIO SURGEON STRL SZ 6.5 (GLOVE) ×2 IMPLANT
GLOVE BIOGEL PI IND STRL 6.5 (GLOVE) IMPLANT
GLOVE BIOGEL PI IND STRL 7.0 (GLOVE) ×6 IMPLANT
GLOVE ECLIPSE 8.0 STRL XLNG CF (GLOVE) IMPLANT
GLOVE SS BIOGEL STRL SZ 6.5 (GLOVE) IMPLANT
GOWN STRL REUS W/ TWL LRG LVL3 (GOWN DISPOSABLE) ×2 IMPLANT
GOWN STRL REUS W/TWL LRG LVL3 (GOWN DISPOSABLE) ×5 IMPLANT
GOWN STRL REUS W/TWL XL LVL3 (GOWN DISPOSABLE) IMPLANT
KIT BLADEGUARD II DBL (SET/KITS/TRAYS/PACK) IMPLANT
KIT TURNOVER CYSTO (KITS) ×2 IMPLANT
NDL HYPO 21X1.5 SAFETY (NEEDLE) ×2 IMPLANT
NEEDLE HYPO 21X1.5 SAFETY (NEEDLE) ×1 IMPLANT
NS IRRIG 1000ML POUR BTL (IV SOLUTION) ×2 IMPLANT
PACK PERI GYN (CUSTOM PROCEDURE TRAY) ×2 IMPLANT
PAD ARMBOARD 7.5X6 YLW CONV (MISCELLANEOUS) ×4 IMPLANT
SET BASIN LINEN APH (SET/KITS/TRAYS/PACK) ×2 IMPLANT
SPONGE T-LAP 4X18 ~~LOC~~+RFID (SPONGE) ×2 IMPLANT
SUT VIC AB 0 CT1 27 (SUTURE) ×3
SUT VIC AB 0 CT1 27XCR 8 STRN (SUTURE) ×4 IMPLANT
SUT VIC AB 0 CT1 36 (SUTURE) ×2 IMPLANT
SYR BULB IRRIG 60ML STRL (SYRINGE) ×2 IMPLANT
SYR CONTROL 10ML LL (SYRINGE) ×2 IMPLANT
TOWEL OR 17X24 6PK STRL BLUE (TOWEL DISPOSABLE) ×2 IMPLANT
TRAY FOLEY W/BAG SLVR 14FR (SET/KITS/TRAYS/PACK) ×2 IMPLANT
WATER STERILE IRR 1000ML POUR (IV SOLUTION) ×2 IMPLANT

## 2022-09-22 NOTE — Op Note (Signed)
Preoperative diagnosis:   1) Abnormal uterine bleeding 2) Morbid obesity                                       Postoperative diagnosis:  Same as above   Procedure:  Vaginal hysterectomy  Surgeon:  Dr. Janyth Pupa  Asst: Dr. Darlis Loan  Anesthesia:  General Endotracheal  Findings:  normal appearing uterus and cervix  IVF: 1400cc UOP: 200cc EBL: 250cc  Description of operation:  The patient was taken from the preoperative area to the operating room in stable condition. She was placed in the sitting position and underwent a spinal anesthetic. Once an adequate level of anesthesia was attained she was placed in the dorsal lithotomy position. Patient was prepped and draped in the usual sterile fashion and a Foley catheter was placed.  A weighted speculum was placed and the cervix was grasped with single tooth tenaculums anteriorly and  tonsil clamp posteriorly.  0.5% Marcaine with epinephrine was injected in a circumferential fashion about the cervix and the electrocautery unit was used to incise the vagina and push at all cervix.  The posterior cul-de-sac was then entered sharply with some difficulty due to visualization secondary to the patient's body habitus. The uterosacral ligaments were clamped cut and inspection suture ligated and held.  The cardinal ligaments were then clamped cut transfixion suture ligated and cut. The anterior peritoneum was identified the anterior cul-de-sac was entered sharply without difficulty. The anterior and posterior leaves of the broad ligament were plicated and the uterine vessels were clamped cut and suture ligated. Serial pedicles were taken of the fundus with each pedicle being clamped cut and suture ligated.  Visualization was challenging due to body habitus and readjustment of retractors was required to ensure visualization.  The utero-ovarian ligaments on the right was cross clamped.  Hemoclip was placed.  The pedicle was cut and suture ligated.  A similar  procedure was carried out on the left.  There was good hemostasis of all the pedicles.   The anterior posterior vagina was closed in interrupted fashion with good resultant hemostasis. The sponge needle and instrument counts were correct x 3.  Total blood loss for the procedure was 250cc cc.  The patient received 3 g of Ancef and 15 mg of Toradol IV preoperatively prophylactically.  She was taken to the recovery room in good stable condition awake alert doing well.  An experienced assistant was required given the standard of surgical care given the complexity of the case.  This assistant was needed for exposure, dissection, suctioning, retraction, instrument exchange, and for overall help during the procedure.   Janyth Pupa, DO Attending [place], [facility] for [employer], [place]

## 2022-09-22 NOTE — Anesthesia Procedure Notes (Signed)
Procedure Name: Intubation Date/Time: 09/22/2022 7:49 AM  Performed by: Genelle Bal, CRNAPre-anesthesia Checklist: Patient identified, Emergency Drugs available, Suction available and Patient being monitored Patient Re-evaluated:Patient Re-evaluated prior to induction Oxygen Delivery Method: Circle system utilized Preoxygenation: Pre-oxygenation with 100% oxygen Induction Type: IV induction Ventilation: Mask ventilation without difficulty Laryngoscope Size: Miller and 2 Grade View: Grade I Tube type: Oral Tube size: 7.0 mm Number of attempts: 1 Airway Equipment and Method: Stylet Placement Confirmation: ETT inserted through vocal cords under direct vision, positive ETCO2 and breath sounds checked- equal and bilateral Secured at: 21 cm Tube secured with: Tape Dental Injury: Teeth and Oropharynx as per pre-operative assessment

## 2022-09-22 NOTE — Anesthesia Postprocedure Evaluation (Signed)
Anesthesia Post Note  Patient: [name]  Procedure(s) Performed: Kallie Locks (Vagina )  Patient location during evaluation: PACU Anesthesia Type: General Level of consciousness: awake and alert and oriented Pain management: pain level controlled Vital Signs Assessment: post-procedure vital signs reviewed and stable Respiratory status: spontaneous breathing, nonlabored ventilation and respiratory function stable Cardiovascular status: blood pressure returned to baseline and stable Postop Assessment: no apparent nausea or vomiting Anesthetic complications: no  No notable events documented.   Last Vitals:  Vitals:   09/22/22 1100 09/22/22 1123  BP: 122/82 124/74  Pulse: 99 (!) 105  Resp: (!) 27 17  Temp:  37.1 C  SpO2: 92% 98%    Last Pain:  Vitals:   09/22/22 1123  TempSrc: Oral  PainSc: 2                  Isaiahs Chancy C Youcef Klas

## 2022-09-22 NOTE — Progress Notes (Signed)
Rx sent in for CIpro.  Pt called and informed.  Janyth Pupa, DO Attending [place], [facility] for [employer], [place]

## 2022-09-22 NOTE — Discharge Instructions (Addendum)
Post Operative Pain Med Plan:  >Take your Lyrica three times per day, for 4 days, try to space them evenly  >Take the oxycodone- 1 tablet, on a schedule, around the clock, every 6 hours(set your phone alarm) for the first 2 days, there may be times when you will need 2 tablets but taking them on a schedule will decrease this need  >You can also take Tylenol together with the oxycodone.  If the oxycodone seems "to strong" then just take Tylenol  >Oxycodone will cause constipation, please be sure to take a stool softener (Colace) twice daily while taking this pain medication and/or continue this medication until your bowel regimen returns to normal  >Take the Toradol every 8 hours for the first 3 days then the remainder to supplement the pain as needed  >After the toradol is gone switch to Ibuprofen 600mg  every 6 hours as needed  If possible try to take the Toradol or Ibuprofen with food to help avoid upsetting your stomach  >Use a heating pad as well as needed  >I have also sent a prescription for zofran (ondansetron) for nausea to take if needed over the first couple of days  >Be gentle with your diet the first few days, liquids and soft non spicy food, fruits are great  >Get up and move, no lifting or straining   HOME INSTRUCTIONS  Please note any unusual or excessive bleeding, pain, swelling. Mild dizziness or drowsiness are normal for about 24 hours after surgery.   Shower when comfortable  Restrictions: No driving for 24 hours or while taking pain medications.  Activity:  No heavy lifting (> 10 lbs), nothing in vagina (no tampons, douching, or intercourse) x 4 weeks; no tub baths for 4 weeks Vaginal spotting is expected but if your bleeding is heavy, period like,  please call the office   Incision: the bandaids will fall off when they are ready to; you may clean your incision with mild soap and water but do not rub or scrub the incision site.  You may experience slight bloody  drainage from your incision periodically.  This is normal.  If you experience a large amount of drainage or the incision opens, please call your physician who will likely direct you to the emergency department.  Diet:  You may return to your regular diet.  Do not eat large meals.  Eat small frequent meals throughout the day.  Continue to drink a good amount of water at least 6-8 glasses of water per day, hydration is very important for the healing process.  Pain Management: Follow instructions above.  Always take prescription pain medication with food.  Oxycodone may cause constipation, you may want to take a stool softener while taking this medication.  A prescription of colace has been sent in to take twice daily if needed while taking the oxycodone.  Be sure to drink plenty of fluids and increase your fiber to help with constipation.  Alcohol -- Avoid for 24 hours and while taking pain medications.  Nausea: Take sips of ginger ale or soda  Fever -- Call physician if temperature over 101 degrees  Follow up:  If you do not already have a follow up appointment scheduled, please call the office at [phone].  If you experience fever (a temperature greater than 100.4), pain unrelieved by pain medication, shortness of breath, swelling of a single leg, or any other symptoms which are concerning to you please the office immediately.

## 2022-09-22 NOTE — Anesthesia Preprocedure Evaluation (Signed)
Anesthesia Evaluation  Patient identified by MRN, date of birth, ID band Patient awake    Reviewed: Allergy & Precautions, H&P , NPO status , Patient's Chart, lab work & pertinent test results, reviewed documented beta blocker date and time   Airway Mallampati: II  TM Distance: >3 FB Neck ROM: Full    Dental  (+) Dental Advisory Given, Missing, Poor Dentition, Chipped   Pulmonary former smoker   Pulmonary exam normal breath sounds clear to auscultation       Cardiovascular Exercise Tolerance: Good hypertension, Pt. on medications and Pt. on home beta blockers Normal cardiovascular exam Rhythm:Regular Rate:Normal     Neuro/Psych  Headaches PSYCHIATRIC DISORDERS Anxiety Depression     Neuromuscular disease (lumbar radiculopathy)    GI/Hepatic Neg liver ROS,GERD  Medicated and Controlled,,  Endo/Other    Morbid obesity  Renal/GU negative Renal ROS  negative genitourinary   Musculoskeletal   Abdominal   Peds negative pediatric ROS (+)  Hematology negative hematology ROS (+)   Anesthesia Other Findings   Reproductive/Obstetrics negative OB ROS                             Anesthesia Physical Anesthesia Plan  ASA: 3  Anesthesia Plan: General   Post-op Pain Management: Dilaudid IV   Induction: Intravenous  PONV Risk Score and Plan: 4 or greater and Ondansetron, Dexamethasone and Midazolam  Airway Management Planned: Oral ETT  Additional Equipment:   Intra-op Plan:   Post-operative Plan: Extubation in OR  Informed Consent: I have reviewed the patients History and Physical, chart, labs and discussed the procedure including the risks, benefits and alternatives for the proposed anesthesia with the patient or authorized representative who has indicated his/her understanding and acceptance.     Dental advisory given  Plan Discussed with: CRNA and Surgeon  Anesthesia Plan Comments:         Anesthesia Quick Evaluation

## 2022-09-22 NOTE — Transfer of Care (Signed)
Immediate Anesthesia Transfer of Care Note  Patient: [name]  Procedure(s) Performed: Kallie Locks (Vagina )  Patient Location: PACU  Anesthesia Type:General  Level of Consciousness: awake, alert , and oriented  Airway & Oxygen Therapy: Patient Spontanous Breathing and Patient connected to face mask oxygen  Post-op Assessment: Report given to RN and Post -op Vital signs reviewed and stable  Post vital signs: Reviewed and stable  Last Vitals:  Vitals Value Taken Time  BP 131/78   Temp    Pulse 95 09/22/22 1027  Resp 13 09/22/22 1027  SpO2 99 % 09/22/22 1027  Vitals shown include unvalidated device data.  Last Pain:  Vitals:   09/22/22 0647  TempSrc: Oral  PainSc: 7       Patients Stated Pain Goal: 5 (27/51/70 0174)  Complications: No notable events documented.

## 2022-09-23 ENCOUNTER — Telehealth: Payer: Self-pay | Admitting: Obstetrics & Gynecology

## 2022-09-23 LAB — SURGICAL PATHOLOGY

## 2022-09-23 NOTE — Telephone Encounter (Signed)
Pt doing well- notes she is feeling sore, but otherwise ok.  Voiding and passing gas.  Plans to pick up rx for antibiotic later today.  Myna Hidalgo, DO Attending Obstetrician & Gynecologist, [facility] for [employer], [facility]

## 2022-09-29 ENCOUNTER — Encounter (HOSPITAL_COMMUNITY): Payer: Self-pay | Admitting: Obstetrics & Gynecology

## 2022-09-30 ENCOUNTER — Ambulatory Visit (INDEPENDENT_AMBULATORY_CARE_PROVIDER_SITE_OTHER): Payer: Medicaid Other | Admitting: Obstetrics & Gynecology

## 2022-09-30 ENCOUNTER — Encounter: Payer: Self-pay | Admitting: Obstetrics & Gynecology

## 2022-09-30 VITALS — BP 132/89 | HR 99 | Ht 66.0 in | Wt 397.2 lb

## 2022-09-30 DIAGNOSIS — Z4889 Encounter for other specified surgical aftercare: Secondary | ICD-10-CM

## 2022-09-30 NOTE — Progress Notes (Signed)
    PostOp Visit Note  [name] is a 36 y.o. [id number] female who presents for a postoperative visit. She is 1 week postop following a TVH completed on 11/7   Today she notes some soreness, taking pain medication as needed. Denies fever or chills.  Tolerating gen diet.  +Flatus, Regular BMs.  Overall doing well and reports no acute complaints   Review of Systems Pertinent items are noted in HPI.    Objective:  BP 132/89 (BP Location: Right Arm, Patient Position: Sitting, Cuff Size: Normal)   Pulse 99   Ht 5\' 6"  (1.676 m)   Wt (!) 397 lb 3.2 oz (180.2 kg)   LMP 08/25/2022 Comment: Urine pregnancy negative on 09-22-2022  Breastfeeding No   BMI 64.11 kg/m    Physical Examination:  GENERAL ASSESSMENT: well developed and well nourished SKIN: warm and dry CHEST: normal air exchange, respiratory effort normal with no retractions HEART: regular rate and rhythm ABDOMEN: obese, soft and non-tender EXTREMITY: no calf tenderness bilaterally PSYCH: mood appropriate, normal affect       Assessment:    Postop check   Plan:   Meeting milestones appropriately Reviewed restrictions regarding activity level F/U in 5wk for final postop visit  13-05-2022, DO Attending Obstetrician & Gynecologist, [facility] for Myna Hidalgo, [facility]

## 2022-11-04 ENCOUNTER — Encounter: Payer: Medicaid Other | Admitting: Obstetrics & Gynecology

## 2022-11-24 ENCOUNTER — Encounter: Payer: Medicaid Other | Admitting: Obstetrics & Gynecology

## 2023-10-01 ENCOUNTER — Other Ambulatory Visit: Payer: Self-pay | Admitting: Adult Health

## 2024-01-20 ENCOUNTER — Other Ambulatory Visit (HOSPITAL_COMMUNITY): Payer: Self-pay | Admitting: Gerontology

## 2024-01-20 ENCOUNTER — Ambulatory Visit (HOSPITAL_COMMUNITY)
Admission: RE | Admit: 2024-01-20 | Discharge: 2024-01-20 | Disposition: A | Discharge status: Home / Self Care | Source: Ambulatory Visit | Attending: Gerontology | Admitting: Gerontology

## 2024-01-20 DIAGNOSIS — M25531 Pain in right wrist: Secondary | ICD-10-CM

## 2024-01-26 ENCOUNTER — Other Ambulatory Visit: Payer: Self-pay

## 2024-04-20 ENCOUNTER — Other Ambulatory Visit: Payer: Self-pay | Admitting: Orthopedic Surgery

## 2024-05-10 ENCOUNTER — Encounter (HOSPITAL_COMMUNITY): Payer: Self-pay | Admitting: Orthopedic Surgery

## 2024-05-10 ENCOUNTER — Other Ambulatory Visit: Payer: Self-pay

## 2024-05-10 NOTE — Progress Notes (Signed)
 PCP - Dr Benita Outhouse Cardiologist - none  Chest x-ray - n/a EKG - DOS Stress Test - n/a ECHO - n/a Cardiac Cath - n/a  ICD Pacemaker/Loop - n/a  Sleep Study -  n/a CPAP - none  Diabetes Type 2 Do not take Metformin on the the morning of surgery.  THE NIGHT BEFORE SURGERY, take 22 units of Lantus Insulin.    Aspirin  & Blood Thinner Instructions:  n/a  ERAS - clear liquids til 6:15 AM DOS.  Anesthesia review: Yes  STOP now taking any Aspirin  (unless otherwise instructed by your surgeon), Aleve , Naproxen , Ibuprofen , Motrin , Advil , Celebrex, Goody's, BC's, all herbal medications, fish oil, and all vitamins.   Coronavirus Screening Do you have any of the following symptoms:  Cough yes/no: No Fever (>100.1F)  yes/no: No Runny nose yes/no: No Sore throat yes/no: No Difficulty breathing/shortness of breath  yes/no: No  Have you traveled in the last 14 days and where? yes/no: No  Patient verbalized understanding of instructions that were given via phone.,

## 2024-05-11 ENCOUNTER — Ambulatory Visit (HOSPITAL_BASED_OUTPATIENT_CLINIC_OR_DEPARTMENT_OTHER): Payer: Self-pay | Admitting: Physician Assistant

## 2024-05-11 ENCOUNTER — Other Ambulatory Visit: Payer: Self-pay

## 2024-05-11 ENCOUNTER — Encounter (HOSPITAL_COMMUNITY): Payer: Self-pay | Admitting: Orthopedic Surgery

## 2024-05-11 ENCOUNTER — Encounter (HOSPITAL_COMMUNITY)
Admission: RE | Disposition: A | Discharge status: Home / Self Care | Payer: Self-pay | Source: Home / Self Care | Attending: Orthopedic Surgery

## 2024-05-11 ENCOUNTER — Ambulatory Visit (HOSPITAL_COMMUNITY): Payer: Self-pay | Admitting: Physician Assistant

## 2024-05-11 ENCOUNTER — Ambulatory Visit (HOSPITAL_COMMUNITY)
Admission: RE | Admit: 2024-05-11 | Discharge: 2024-05-11 | Disposition: A | Discharge status: Home / Self Care | Attending: Orthopedic Surgery | Admitting: Orthopedic Surgery

## 2024-05-11 DIAGNOSIS — K219 Gastro-esophageal reflux disease without esophagitis: Secondary | ICD-10-CM | POA: Insufficient documentation

## 2024-05-11 DIAGNOSIS — R519 Headache, unspecified: Secondary | ICD-10-CM | POA: Insufficient documentation

## 2024-05-11 DIAGNOSIS — G5601 Carpal tunnel syndrome, right upper limb: Secondary | ICD-10-CM | POA: Insufficient documentation

## 2024-05-11 DIAGNOSIS — E6689 Other obesity not elsewhere classified: Secondary | ICD-10-CM | POA: Insufficient documentation

## 2024-05-11 DIAGNOSIS — F418 Other specified anxiety disorders: Secondary | ICD-10-CM | POA: Diagnosis not present

## 2024-05-11 DIAGNOSIS — Z794 Long term (current) use of insulin: Secondary | ICD-10-CM | POA: Insufficient documentation

## 2024-05-11 DIAGNOSIS — F419 Anxiety disorder, unspecified: Secondary | ICD-10-CM | POA: Insufficient documentation

## 2024-05-11 DIAGNOSIS — I10 Essential (primary) hypertension: Secondary | ICD-10-CM | POA: Diagnosis not present

## 2024-05-11 DIAGNOSIS — Z79899 Other long term (current) drug therapy: Secondary | ICD-10-CM | POA: Insufficient documentation

## 2024-05-11 DIAGNOSIS — Z6841 Body Mass Index (BMI) 40.0 and over, adult: Secondary | ICD-10-CM | POA: Insufficient documentation

## 2024-05-11 DIAGNOSIS — Z87891 Personal history of nicotine dependence: Secondary | ICD-10-CM | POA: Diagnosis not present

## 2024-05-11 DIAGNOSIS — F32A Depression, unspecified: Secondary | ICD-10-CM | POA: Insufficient documentation

## 2024-05-11 DIAGNOSIS — E119 Type 2 diabetes mellitus without complications: Secondary | ICD-10-CM | POA: Diagnosis not present

## 2024-05-11 DIAGNOSIS — Z7984 Long term (current) use of oral hypoglycemic drugs: Secondary | ICD-10-CM | POA: Diagnosis not present

## 2024-05-11 HISTORY — PX: CARPAL TUNNEL RELEASE: SHX101

## 2024-05-11 HISTORY — DX: Type 2 diabetes mellitus without complications: E11.9

## 2024-05-11 LAB — BASIC METABOLIC PANEL WITH GFR
Anion gap: 12 (ref 5–15)
BUN: 11 mg/dL (ref 6–20)
CO2: 17 mmol/L — ABNORMAL LOW (ref 22–32)
Calcium: 8.7 mg/dL — ABNORMAL LOW (ref 8.9–10.3)
Chloride: 106 mmol/L (ref 98–111)
Creatinine, Ser: 0.96 mg/dL (ref 0.44–1.00)
GFR, Estimated: >60 mL/min (ref >60)
Glucose, Bld: 145 mg/dL — ABNORMAL HIGH (ref 70–99)
Potassium: 3.8 mmol/L (ref 3.5–5.1)
Sodium: 135 mmol/L (ref 135–145)

## 2024-05-11 LAB — GLUCOSE, CAPILLARY
Glucose-Capillary: 157 mg/dL — ABNORMAL HIGH (ref 70–99)
Glucose-Capillary: 156 mg/dL — ABNORMAL HIGH (ref 70–99)
Glucose-Capillary: 156 mg/dL — ABNORMAL HIGH (ref 70–99)

## 2024-05-11 LAB — CBC
HCT: 39.9 % (ref 36.0–46.0)
Hemoglobin: 13.3 g/dL (ref 12.0–15.0)
MCH: 28.5 pg (ref 26.0–34.0)
MCHC: 33.3 g/dL (ref 30.0–36.0)
MCV: 85.4 fL (ref 80.0–100.0)
Platelets: 282 10*3/uL (ref 150–400)
RBC: 4.67 MIL/uL (ref 3.87–5.11)
RDW: 14.8 % (ref 11.5–15.5)
WBC: 11.3 10*3/uL — ABNORMAL HIGH (ref 4.0–10.5)
nRBC: 0 % (ref 0.0–0.2)

## 2024-05-11 SURGERY — CARPAL TUNNEL RELEASE
Anesthesia: General | Site: Wrist | Laterality: Right

## 2024-05-11 MED ORDER — FENTANYL CITRATE (PF) 100 MCG/2ML IJ SOLN: 25.0000 ug | INTRAMUSCULAR | Status: DC | PRN

## 2024-05-11 MED ORDER — LACTATED RINGERS IV SOLN: INTRAVENOUS | Status: DC

## 2024-05-11 MED ORDER — ONDANSETRON HCL 4 MG/2ML IJ SOLN
INTRAMUSCULAR | Status: DC | PRN
  Administered 2024-05-11: 4 mg via INTRAVENOUS

## 2024-05-11 MED ORDER — OXYCODONE HCL 5 MG/5ML PO SOLN: 5.0000 mg | Freq: Once | ORAL | Status: AC | PRN

## 2024-05-11 MED ORDER — DEXAMETHASONE SODIUM PHOSPHATE 10 MG/ML IJ SOLN: INTRAMUSCULAR | Status: DC | PRN

## 2024-05-11 MED ORDER — ORAL CARE MOUTH RINSE: 15.0000 mL | Freq: Once | OROMUCOSAL | Status: AC

## 2024-05-11 MED ORDER — PROPOFOL 10 MG/ML IV BOLUS
INTRAVENOUS | Status: AC
  Filled 2024-05-11: qty 20

## 2024-05-11 MED ORDER — MIDAZOLAM HCL 2 MG/2ML IJ SOLN
INTRAMUSCULAR | Status: DC | PRN
  Administered 2024-05-11: 2 mg via INTRAVENOUS

## 2024-05-11 MED ORDER — HYDROCODONE-ACETAMINOPHEN 5-325 MG PO TABS: 1.0000 | ORAL_TABLET | Freq: Four times a day (QID) | ORAL | 0 refills | Status: AC | PRN

## 2024-05-11 MED ORDER — FENTANYL CITRATE (PF) 250 MCG/5ML IJ SOLN
INTRAMUSCULAR | Status: AC
  Filled 2024-05-11: qty 5

## 2024-05-11 MED ORDER — CEFAZOLIN SODIUM-DEXTROSE 2-4 GM/100ML-% IV SOLN
2.0000 g | INTRAVENOUS | Status: AC
  Administered 2024-05-11: 2 g via INTRAVENOUS
  Filled 2024-05-11: qty 100

## 2024-05-11 MED ORDER — OXYCODONE HCL 5 MG PO TABS
5.0000 mg | ORAL_TABLET | Freq: Once | ORAL | Status: AC | PRN
  Administered 2024-05-11: 5 mg via ORAL

## 2024-05-11 MED ORDER — BUPIVACAINE HCL (PF) 0.25 % IJ SOLN
INTRAMUSCULAR | Status: DC | PRN
  Administered 2024-05-11: 9 mL

## 2024-05-11 MED ORDER — OXYCODONE HCL 5 MG PO TABS
ORAL_TABLET | ORAL | Status: AC
  Filled 2024-05-11: qty 1

## 2024-05-11 MED ORDER — INSULIN ASPART 100 UNIT/ML IJ SOLN
0.0000 [IU] | INTRAMUSCULAR | Status: AC | PRN
  Administered 2024-05-11: 2 [IU] via SUBCUTANEOUS
  Filled 2024-05-11: qty 1

## 2024-05-11 MED ORDER — FENTANYL CITRATE (PF) 250 MCG/5ML IJ SOLN
INTRAMUSCULAR | Status: DC | PRN
  Administered 2024-05-11 (×2): 100 ug via INTRAVENOUS
  Administered 2024-05-11: 50 ug via INTRAVENOUS

## 2024-05-11 MED ORDER — ACETAMINOPHEN 10 MG/ML IV SOLN: 1000.0000 mg | Freq: Once | INTRAVENOUS | Status: DC | PRN

## 2024-05-11 MED ORDER — BUPIVACAINE HCL (PF) 0.25 % IJ SOLN
INTRAMUSCULAR | Status: AC
  Filled 2024-05-11: qty 30

## 2024-05-11 MED ORDER — PROPOFOL 10 MG/ML IV BOLUS
INTRAVENOUS | Status: DC | PRN
  Administered 2024-05-11: 200 mg via INTRAVENOUS

## 2024-05-11 MED ORDER — MIDAZOLAM HCL 2 MG/2ML IJ SOLN
INTRAMUSCULAR | Status: AC
  Filled 2024-05-11: qty 2

## 2024-05-11 MED ORDER — PROPOFOL 10 MG/ML IV BOLUS
INTRAVENOUS | Status: AC
Start: 2024-05-11 — End: 2024-05-11
  Filled 2024-05-11: qty 20

## 2024-05-11 MED ORDER — INSULIN ASPART 100 UNIT/ML IJ SOLN
INTRAMUSCULAR | Status: AC
  Administered 2024-05-11: 2 [IU] via SUBCUTANEOUS
  Filled 2024-05-11: qty 1

## 2024-05-11 MED ORDER — CHLORHEXIDINE GLUCONATE 0.12 % MT SOLN
15.0000 mL | Freq: Once | OROMUCOSAL | Status: AC
  Administered 2024-05-11: 15 mL via OROMUCOSAL
  Filled 2024-05-11: qty 15

## 2024-05-11 SURGICAL SUPPLY — 29 items
BNDG COMPR ESMARK 4X3 LF (GAUZE/BANDAGES/DRESSINGS) ×2 IMPLANT
BNDG ELASTIC 3INX 5YD STR LF (GAUZE/BANDAGES/DRESSINGS) ×2 IMPLANT
BNDG GAUZE DERMACEA FLUFF 4 (GAUZE/BANDAGES/DRESSINGS) ×2 IMPLANT
CHLORAPREP W/TINT 26 (MISCELLANEOUS) ×2 IMPLANT
CORD BIPOLAR FORCEPS 12FT (ELECTRODE) ×2 IMPLANT
COVER SURGICAL LIGHT HANDLE (MISCELLANEOUS) ×2 IMPLANT
CUFF TOURN SGL QUICK 18X4 (TOURNIQUET CUFF) ×2 IMPLANT
DRAPE SURG 17X23 STRL (DRAPES) ×2 IMPLANT
GAUZE PAD ABD 8X10 STRL (GAUZE/BANDAGES/DRESSINGS) ×4 IMPLANT
GAUZE SPONGE 4X4 12PLY STRL (GAUZE/BANDAGES/DRESSINGS) ×2 IMPLANT
GAUZE XEROFORM 1X8 LF (GAUZE/BANDAGES/DRESSINGS) ×2 IMPLANT
GLOVE BIO SURGEON STRL SZ7.5 (GLOVE) ×4 IMPLANT
GLOVE BIOGEL PI IND STRL 8 (GLOVE) ×2 IMPLANT
GOWN STRL REUS W/ TWL LRG LVL3 (GOWN DISPOSABLE) ×4 IMPLANT
KIT BASIN OR (CUSTOM PROCEDURE TRAY) ×2 IMPLANT
KIT TURNOVER KIT B (KITS) ×2 IMPLANT
NDL HYPO 25GX1X1/2 BEV (NEEDLE) IMPLANT
NEEDLE HYPO 25GX1X1/2 BEV (NEEDLE) ×1 IMPLANT
NS IRRIG 1000ML POUR BTL (IV SOLUTION) ×2 IMPLANT
PACK ORTHO EXTREMITY (CUSTOM PROCEDURE TRAY) ×2 IMPLANT
PAD ARMBOARD POSITIONER FOAM (MISCELLANEOUS) ×4 IMPLANT
PADDING CAST ABS COTTON 4X4 ST (CAST SUPPLIES) ×2 IMPLANT
SUCTION TUBE FRAZIER 10FR DISP (SUCTIONS) IMPLANT
SUT ETHILON 3 0 PS 1 (SUTURE) ×2 IMPLANT
SYR CONTROL 10ML LL (SYRINGE) IMPLANT
TOWEL GREEN STERILE (TOWEL DISPOSABLE) ×2 IMPLANT
TUBE CONNECTING 12X1/4 (SUCTIONS) IMPLANT
UNDERPAD 30X36 HEAVY ABSORB (UNDERPADS AND DIAPERS) ×2 IMPLANT
WATER STERILE IRR 1000ML POUR (IV SOLUTION) ×2 IMPLANT

## 2024-05-11 NOTE — Discharge Instructions (Signed)

## 2024-05-11 NOTE — Anesthesia Preprocedure Evaluation (Signed)
 Anesthesia Evaluation  Patient identified by MRN, date of birth, ID band Patient awake    Reviewed: Allergy & Precautions, NPO status , Patient's Chart, lab work & pertinent test results  History of Anesthesia Complications Negative for: history of anesthetic complications  Airway Mallampati: III  TM Distance: >3 FB     Dental  (+) Poor Dentition, Edentulous Upper,    Pulmonary neg COPD, former smoker   breath sounds clear to auscultation       Cardiovascular hypertension, Pt. on medications (-) angina (-) Past MI and (-) CHF  Rhythm:Regular     Neuro/Psych  Headaches PSYCHIATRIC DISORDERS Anxiety Depression     Neuromuscular disease    GI/Hepatic Neg liver ROS,GERD  Medicated and Controlled,,  Endo/Other  diabetes  Class 4 obesity  Renal/GU Lab Results      Component                Value               Date                      NA                       135                 05/11/2024                K                        3.8                 05/11/2024                CO2                      17 (L)              05/11/2024                GLUCOSE                  145 (H)             05/11/2024                BUN                      11                  05/11/2024                CREATININE               0.96                05/11/2024                CALCIUM                  8.7 (L)             05/11/2024                GFRNONAA                 >60                 05/11/2024  Musculoskeletal   Abdominal   Peds  Hematology negative hematology ROS (+) Lab Results      Component                Value               Date                      WBC                      11.3 (H)            05/11/2024                HGB                      13.3                05/11/2024                HCT                      39.9                05/11/2024                MCV                      85.4                05/11/2024                 PLT                      282                 05/11/2024              Anesthesia Other Findings   Reproductive/Obstetrics                             Anesthesia Physical Anesthesia Plan  ASA: 4  Anesthesia Plan: General   Post-op Pain Management: Toradol  IV (intra-op)*   Induction: Intravenous  PONV Risk Score and Plan: 4 or greater and Ondansetron  and Dexamethasone   Airway Management Planned: LMA and Oral ETT  Additional Equipment: None  Intra-op Plan:   Post-operative Plan: Extubation in OR  Informed Consent: I have reviewed the patients History and Physical, chart, labs and discussed the procedure including the risks, benefits and alternatives for the proposed anesthesia with the patient or authorized representative who has indicated his/her understanding and acceptance.     Dental advisory given  Plan Discussed with: CRNA  Anesthesia Plan Comments:        Anesthesia Quick Evaluation

## 2024-05-11 NOTE — Anesthesia Procedure Notes (Signed)
 Procedure Name: LMA Insertion Date/Time: 05/11/2024 10:08 AM  Performed by: Lockie Flesher, CRNAPre-anesthesia Checklist: Patient identified, Emergency Drugs available, Suction available and Patient being monitored Patient Re-evaluated:Patient Re-evaluated prior to induction Oxygen Delivery Method: Circle System Utilized Preoxygenation: Pre-oxygenation with 100% oxygen Induction Type: IV induction Ventilation: Mask ventilation without difficulty LMA: LMA inserted LMA Size: 4.0 Number of attempts: 1 Airway Equipment and Method: Bite block Placement Confirmation: positive ETCO2 Tube secured with: Tape Dental Injury: Teeth and Oropharynx as per pre-operative assessment

## 2024-05-11 NOTE — H&P (Signed)
 [name] is an 38 y.o. female.   Chief Complaint: carpal tunnel syndrome HPI: 38 y.o. yo female with numbness and tingling right hand.  Nocturnal symptoms. Positive nerve conduction studies. She wishes to have right carpal tunnel release.   Allergies:  Allergies  Allergen Reactions   Nucynta [Tapentadol] Itching    Pt unaware of this reaction    Tape Itching   Naproxen  Other (See Comments)    Was told by doctor not to take medication due to it messing up her esophagus .    Past Medical History:  Diagnosis Date   Abdominal pain, chronic, epigastric    Abdominal pain, chronic, right lower quadrant    Anxiety    Bulging disc    Constipation    hx   Contraceptive management 07/18/2014   Depression    Diabetes mellitus without complication (HCC)    dx as of 01/05/23 [facility]   Disk prolapse    Elevated liver enzymes 12/01/2013   Fatty liver 07/23/2017   Gastritis    GERD (gastroesophageal reflux disease)    Headache    Herpes    Hypertension    Mental disorder    anxiety   Obesity    Other and unspecified ovarian cyst 12/01/2013   Ovarian cyst, right    Panic attacks    Pre-diabetes    now diabetic as of 01/05/23 [facility]   Splenomegaly 07/23/2017    Past Surgical History:  Procedure Laterality Date   CHOLECYSTECTOMY N/A 08/14/2021   Procedure: LAPAROSCOPIC CHOLECYSTECTOMY WITH INTRAOPERATIVE CHOLANGIOGRAM;  Surgeon: Eletha Boas, MD;  Location: [facility];  Service: General;  Laterality: N/A;   COLONOSCOPY WITH ESOPHAGOGASTRODUODENOSCOPY (EGD) N/A 02/24/2013   DOQ:[id number] RECTAL BLEEDING DUE TO Moderate sized internal hemorrhoids, EGD: erosive esophagitis due to uncontrolled reflux, moderate erosive gastritis, benign path   ESOPHAGOGASTRODUODENOSCOPY  01/19/2012   DOQ:LORZMD,FLOUPEOZ IN THE ANTRUM/HIATAL HERNIA/   ESOPHAGOGASTRODUODENOSCOPY N/A 07/31/2016   Procedure: ESOPHAGOGASTRODUODENOSCOPY (EGD);  Surgeon: Margo LITTIE Haddock, MD;  Location: [facility];  Service: Endoscopy;   Laterality: N/A;  10:15 AM   HYSTEROSCOPY WITH D & C N/A 03/17/2022   Procedure: DILATATION AND CURETTAGE /HYSTEROSCOPY;  Surgeon: Ozan, Jennifer, DO;  Location: [facility];  Service: Gynecology;  Laterality: N/A;   POLYPECTOMY N/A 03/17/2022   Procedure: POLYPECTOMY;  Surgeon: Marilynn Nest, DO;  Location: [facility];  Service: Gynecology;  Laterality: N/A;   TUBAL LIGATION Bilateral 12/08/2020   Procedure: POST PARTUM TUBAL LIGATION;  Surgeon: Fredirick Glenys RAMAN, MD;  Location: [facility];  Service: Gynecology;  Laterality: Bilateral;   VAGINAL HYSTERECTOMY N/A 09/22/2022   Procedure: HYSTERECTOMY CAROLINE DROWN;  Surgeon: Ozan, Jennifer, DO;  Location: [facility];  Service: Gynecology;  Laterality: N/A;   WISDOM TOOTH EXTRACTION      Family History: Family History  Problem Relation Age of Onset   Kidney disease Mother    GER disease Mother    Other Mother        diverticulitis; hernia   Diabetes Mother        borderline   Anxiety disorder Mother    Depression Mother    Hyperlipidemia Mother    Osteoporosis Mother    Diabetes Maternal Grandmother    COPD Maternal Grandmother    Diabetes Maternal Grandfather    Congestive Heart Failure Maternal Grandfather    Headache Son    ADD / ADHD Son    Diabetes Other    Colon cancer Neg Hx    Colon polyps Neg Hx  Gastric cancer Neg Hx    Esophageal cancer Neg Hx     Social History:   reports that she quit smoking about 13 years ago. Her smoking use included cigarettes. She has never used smokeless tobacco. She reports that she does not drink alcohol and does not use drugs.  Medications: Medications Prior to Admission  Medication Sig Dispense Refill   acetaminophen  (TYLENOL ) 325 MG tablet Take 2 tablets (650 mg total) by mouth every 6 (six) hours as needed.     celecoxib (CELEBREX) 100 MG capsule Take 200 mg by mouth at bedtime.     DULoxetine  (CYMBALTA ) 60 MG capsule Take 60 mg by mouth at bedtime.     hydrOXYzine  (ATARAX /VISTARIL ) 25 MG tablet Take  25 mg by mouth every 8 (eight) hours as needed for anxiety.     insulin glargine (LANTUS SOLOSTAR) 100 UNIT/ML Solostar Pen Inject 45 Units into the skin at bedtime.     metFORMIN (GLUCOPHAGE) 850 MG tablet Take 850 mg by mouth 2 (two) times daily with a meal.     oxyCODONE -acetaminophen  (PERCOCET) 10-325 MG tablet Take 1 tablet by mouth every 4 (four) hours as needed for pain.     pantoprazole  (PROTONIX ) 40 MG tablet TAKE 1 TABLET (40 MG TOTAL) BY MOUTH 2 (TWO) TIMES DAILY BEFORE A MEAL. (Patient taking differently: Take 80 mg by mouth daily.) 60 tablet 10   pregabalin  (LYRICA ) 150 MG capsule Take 150 mg by mouth 3 (three) times daily.     propranolol (INDERAL) 20 MG tablet Take 40 mg by mouth daily.     QULIPTA 60 MG TABS Take 60 mg by mouth daily.     tiZANidine  (ZANAFLEX ) 2 MG tablet Take 2 mg by mouth 3 (three) times daily as needed for muscle spasms.     Topiramate  ER (TROKENDI  XR) 200 MG CP24 Take 200 mg by mouth daily.     valACYclovir  (VALTREX ) 1000 MG tablet Take 1 tablet by mouth once daily 30 tablet 6    Results for orders placed or performed during the hospital encounter of 05/11/24 (from the past 48 hours)  Glucose, capillary     Status: Abnormal   Collection Time: 05/11/24  7:09 AM  Result Value Ref Range   Glucose-Capillary 157 (H) 70 - 99 mg/dL    Comment: Glucose reference range applies only to samples taken after fasting for at least 8 hours.    No results found.    Blood pressure 123/69, pulse 94, temperature 97.9 F (36.6 C), resp. rate 20, height 5' 6 (1.676 m), weight (!) 165.6 kg, last menstrual period 08/25/2022, SpO2 96%.  General appearance: alert, cooperative, and appears stated age Head: Normocephalic, without obvious abnormality, atraumatic Neck: supple, symmetrical, trachea midline Extremities: Intact sensation and capillary refill all digits.  +epl/fpl/io.  No wounds.  Skin: Skin color, texture, turgor normal. No rashes or lesions Neurologic: Grossly  normal Incision/Wound: none  Assessment/Plan Right carpal tunnel syndrome.  Non operative and operative treatment options have been discussed with the patient and patient wishes to proceed with operative treatment. Risks, benefits and alternatives of surgery were discussed including risks of blood loss, infection, damage to nerves/vessels/tendons/ligament/bone, failure of surgery, need for additional surgery, complication with wound healing, stiffness, damage to motor branch, recurrence.  She voiced understanding of these risks and elected to proceed.    Clevester Helzer 05/11/2024, 8:10 AM

## 2024-05-11 NOTE — Op Note (Signed)
 05/11/2024 [facility]                              OPERATIVE REPORT   PREOPERATIVE DIAGNOSIS:  Right carpal tunnel syndrome  POSTOPERATIVE DIAGNOSIS:  Right carpal tunnel syndrome  PROCEDURE:  Right carpal tunnel release  SURGEON:  Franky Curia, MD  ASSISTANT:  none.  ANESTHESIA: General  IV FLUIDS:  Per anesthesia flow sheet  ESTIMATED BLOOD LOSS:  Minimal  COMPLICATIONS:  None  SPECIMENS:  None  TOURNIQUET TIME:    Total Tourniquet Time Documented: Forearm (Right) - 12 minutes Total: Forearm (Right) - 12 minutes   DISPOSITION:  Stable to PACU  LOCATION: [facility]  INDICATIONS:  38 y.o. yo female with numbness and tingling right hand.  Nocturnal symptoms. Positive nerve conduction studies. She wishes to proceed with right carpal tunnel release.  Risks, benefits and alternatives of surgery were discussed including the risk of blood loss; infection; damage to nerves, vessels, tendons, ligaments, bone; failure of surgery; need for additional surgery; complications with wound healing; continued pain; recurrence of carpal tunnel syndrome; and damage to motor branch. She voiced understanding of these risks and elected to proceed.   OPERATIVE COURSE:  After being identified preoperatively by myself, the patient and I agreed upon the procedure and site of procedure.  The surgical site was marked.  Surgical consent had been signed.  She was given IV Ancef  as preoperative antibiotic prophylaxis.  She was transferred to the operating room and placed on the operating room table in supine position with the Right upper extremity on an armboard.  General anesthesia was induced by the anesthesiologist.  Right upper extremity was prepped and draped in normal sterile orthopaedic fashion.  A surgical pause was performed between the surgeons, anesthesia, and operating room staff, and all were in agreement as to the patient, procedure, and site of procedure.  Tourniquet at the proximal aspect of the forearm  was inflated to 250 mmHg after exsanguination of the arm with an Esmarch bandage  Incision was made over the transverse carpal ligament and carried into the subcutaneous tissues by spreading technique.  Bipolar electrocautery was used to obtain hemostasis.  The palmar fascia was sharply incised.  The transverse carpal ligament was identified.  The fascia distal to the ligament was opened.  Retractor was placed and the flexor tendons were identified.  The flexor tendon to the ring finger was identified and retracted radially.  The transverse carpal ligament was then incised from distal to proximal under direct visualization.  Scissors were used to split the distal aspect of the volar antebrachial fascia.  A finger was placed into the wound to ensure complete decompression, which was the case.  The nerve was examined.  It was adherent to the radial leaflet.  The motor branch was identified and was intact.  The wound was copiously irrigated with sterile saline.  It was then closed with 4-0 nylon in a horizontal mattress fashion.  It was injected with 0.25% plain Marcaine  to aid in postoperative analgesia.  It was dressed with sterile Xeroform, 4x4s, an ABD, and wrapped with Kerlix and an Ace bandage.  Tourniquet was deflated at 12 minutes.  Fingertips were pink with brisk capillary refill after deflation of the tourniquet.  Operative drapes were broken down.  The patient was awoken from anesthesia safely.  She was transferred back to stretcher and taken to the PACU in stable condition.  I will see her  back in the office in 1 week for postoperative followup.  I will give her a prescription for Norco 5/325 1 tab PO q6 hours prn pain, dispense #15.    Icie Kuznicki, MD Electronically signed, 05/11/24

## 2024-05-11 NOTE — Transfer of Care (Signed)
 Immediate Anesthesia Transfer of Care Note  Patient: [name]  Procedure(s) Performed: CARPAL TUNNEL RELEASE (Right: Wrist)  Patient Location: PACU  Anesthesia Type:General  Level of Consciousness: awake, alert , and oriented  Airway & Oxygen Therapy: Patient Spontanous Breathing  Post-op Assessment: Report given to RN and Post -op Vital signs reviewed and stable  Post vital signs: Reviewed and stable  Last Vitals:  Vitals Value Taken Time  BP 126/81 05/11/24 10:45  Temp 36.6 C 05/11/24 10:39  Pulse 101 05/11/24 10:46  Resp 16 05/11/24 10:46  SpO2 95 % 05/11/24 10:46  Vitals shown include unfiled device data.  Last Pain:  Vitals:   05/11/24 1039  PainSc: 0-No pain         Complications: No notable events documented.

## 2024-05-12 ENCOUNTER — Encounter (HOSPITAL_COMMUNITY): Payer: Self-pay | Admitting: Orthopedic Surgery

## 2024-05-12 NOTE — Anesthesia Postprocedure Evaluation (Signed)
 Anesthesia Post Note  Patient: [name]  Procedure(s) Performed: CARPAL TUNNEL RELEASE (Right: Wrist)     Patient location during evaluation: PACU Anesthesia Type: General Level of consciousness: awake and alert Pain management: pain level controlled Vital Signs Assessment: post-procedure vital signs reviewed and stable Respiratory status: spontaneous breathing, nonlabored ventilation and respiratory function stable Cardiovascular status: blood pressure returned to baseline and stable Postop Assessment: no apparent nausea or vomiting Anesthetic complications: no   No notable events documented.               Remon Quinto

## 2024-05-24 ENCOUNTER — Other Ambulatory Visit: Payer: Self-pay | Admitting: Adult Health

## 2024-08-01 NOTE — Progress Notes (Signed)
 [name] DOB: 01/27/1986 MRN: [id number]   [name] is being seen in followup for right carpal tunnel release.  She is 3 months postoperative.  She states she is doing well without fever, chills, night sweats.  Pain is well controlled. She has had resolution of nighttime symptoms. Sensation is improved. She is happy with the results.  She has noted similar symptoms in the left hand.  They occasionally wake her from sleep.  EXAMINATION: Intact sensation and capillary refill in the fingertips.  She can flex and extend the IP joint of the thumb and can cross her fingers.  She can oppose the thumb to the small finger and abduct it away from the palm.  There are no signs or symptoms of dystrophy.  The wound is nicely healed.  On the left side she has a positive Tinel's Phalen's and Durkan's at the median nerve at the carpal tunnel.  ASSESSMENT AND PLAN: Status post right carpal tunnel release.  She is approximately 3 months postop.  She states she is doing well overall.  She is happy with the results.  She can resume use of the right hand to tolerance.  Regarding her left hand, we can try splinting it first or we can proceed with nerve conduction studies.  She would like to try splinting at this point.  If she does not have improvement with splinting or has worsening of symptoms I will be happy to see her back and we will discuss nerve conduction studies.  She agrees with the plan of care.

## 2024-09-12 ENCOUNTER — Other Ambulatory Visit (HOSPITAL_COMMUNITY): Payer: Self-pay | Admitting: Gerontology

## 2024-09-12 ENCOUNTER — Ambulatory Visit (HOSPITAL_COMMUNITY)
Admission: RE | Admit: 2024-09-12 | Discharge: 2024-09-12 | Disposition: A | Discharge status: Home / Self Care | Source: Ambulatory Visit | Attending: Gerontology | Admitting: Gerontology

## 2024-09-12 DIAGNOSIS — M25571 Pain in right ankle and joints of right foot: Secondary | ICD-10-CM

## 2024-09-12 DIAGNOSIS — M25562 Pain in left knee: Secondary | ICD-10-CM | POA: Insufficient documentation
